# Patient Record
Sex: Male | Born: 1935
Health system: Southern US, Community
[De-identification: ages and names within clinical notes are randomized; demographics above are authoritative.]

## PROBLEM LIST (undated history)

## (undated) DIAGNOSIS — I10 Essential (primary) hypertension: Secondary | ICD-10-CM

## (undated) DIAGNOSIS — N281 Cyst of kidney, acquired: Secondary | ICD-10-CM

## (undated) DIAGNOSIS — R001 Bradycardia, unspecified: Secondary | ICD-10-CM

## (undated) DIAGNOSIS — M1990 Unspecified osteoarthritis, unspecified site: Secondary | ICD-10-CM

## (undated) DIAGNOSIS — H269 Unspecified cataract: Secondary | ICD-10-CM

## (undated) DIAGNOSIS — N189 Chronic kidney disease, unspecified: Secondary | ICD-10-CM

## (undated) DIAGNOSIS — E785 Hyperlipidemia, unspecified: Secondary | ICD-10-CM

## (undated) DIAGNOSIS — H409 Unspecified glaucoma: Secondary | ICD-10-CM

## (undated) HISTORY — PX: APPENDECTOMY: SHX54

## (undated) HISTORY — DX: Unspecified cataract: H26.9

## (undated) HISTORY — PX: BACK SURGERY: SHX140

## (undated) HISTORY — PX: KNEE SURGERY: SHX244

## (undated) HISTORY — DX: Unspecified glaucoma: H40.9

## (undated) HISTORY — PX: FOOT SURGERY: SHX648

## (undated) HISTORY — PX: SPINE SURGERY: SHX786

## (undated) HISTORY — DX: Hyperlipidemia, unspecified: E78.5

## (undated) HISTORY — PX: JOINT REPLACEMENT: SHX530

## (undated) HISTORY — PX: OTHER SURGICAL HISTORY: SHX169

---

## 1998-01-11 ENCOUNTER — Ambulatory Visit (HOSPITAL_COMMUNITY): Admission: RE | Admit: 1998-01-11 | Discharge: 1998-01-11 | Payer: Self-pay | Admitting: *Deleted

## 1998-06-20 ENCOUNTER — Ambulatory Visit (HOSPITAL_COMMUNITY)
Admission: RE | Admit: 1998-06-20 | Discharge: 1998-06-20 | Payer: Self-pay | Admitting: Thoracic Surgery (Cardiothoracic Vascular Surgery)

## 1998-06-30 ENCOUNTER — Ambulatory Visit (HOSPITAL_COMMUNITY)
Admission: RE | Admit: 1998-06-30 | Discharge: 1998-06-30 | Payer: Self-pay | Admitting: Thoracic Surgery (Cardiothoracic Vascular Surgery)

## 1998-07-05 ENCOUNTER — Other Ambulatory Visit
Admission: RE | Admit: 1998-07-05 | Discharge: 1998-07-05 | Payer: Self-pay | Admitting: Thoracic Surgery (Cardiothoracic Vascular Surgery)

## 1998-07-24 ENCOUNTER — Emergency Department (HOSPITAL_COMMUNITY): Admission: EM | Admit: 1998-07-24 | Discharge: 1998-07-24 | Payer: Self-pay | Admitting: Emergency Medicine

## 1998-07-28 ENCOUNTER — Ambulatory Visit (HOSPITAL_COMMUNITY): Admission: RE | Admit: 1998-07-28 | Discharge: 1998-07-28 | Payer: Self-pay | Admitting: Dermatology

## 1998-08-04 ENCOUNTER — Ambulatory Visit (HOSPITAL_COMMUNITY): Admission: RE | Admit: 1998-08-04 | Discharge: 1998-08-04 | Payer: Self-pay | Admitting: *Deleted

## 1998-08-04 ENCOUNTER — Encounter: Payer: Self-pay | Admitting: *Deleted

## 1998-09-14 ENCOUNTER — Ambulatory Visit (HOSPITAL_COMMUNITY): Admission: RE | Admit: 1998-09-14 | Discharge: 1998-09-14 | Payer: Self-pay | Admitting: *Deleted

## 1999-06-23 ENCOUNTER — Ambulatory Visit (HOSPITAL_COMMUNITY): Admission: RE | Admit: 1999-06-23 | Discharge: 1999-06-23 | Payer: Self-pay | Admitting: *Deleted

## 1999-08-01 ENCOUNTER — Ambulatory Visit (HOSPITAL_COMMUNITY): Admission: RE | Admit: 1999-08-01 | Discharge: 1999-08-02 | Payer: Self-pay | Admitting: Ophthalmology

## 1999-08-01 ENCOUNTER — Encounter: Payer: Self-pay | Admitting: Ophthalmology

## 2000-10-13 ENCOUNTER — Encounter: Payer: Self-pay | Admitting: Family Medicine

## 2000-10-13 ENCOUNTER — Inpatient Hospital Stay (HOSPITAL_COMMUNITY): Admission: EM | Admit: 2000-10-13 | Discharge: 2000-10-16 | Payer: Self-pay | Admitting: Emergency Medicine

## 2000-12-04 ENCOUNTER — Ambulatory Visit (HOSPITAL_COMMUNITY): Admission: RE | Admit: 2000-12-04 | Discharge: 2000-12-04 | Payer: Self-pay | Admitting: *Deleted

## 2001-10-29 ENCOUNTER — Encounter: Payer: Self-pay | Admitting: Orthopaedic Surgery

## 2001-11-04 ENCOUNTER — Inpatient Hospital Stay (HOSPITAL_COMMUNITY): Admission: RE | Admit: 2001-11-04 | Discharge: 2001-11-08 | Payer: Self-pay | Admitting: Orthopaedic Surgery

## 2001-11-08 ENCOUNTER — Inpatient Hospital Stay (HOSPITAL_COMMUNITY)
Admission: RE | Admit: 2001-11-08 | Discharge: 2001-11-14 | Payer: Self-pay | Admitting: Physical Medicine & Rehabilitation

## 2001-11-13 ENCOUNTER — Encounter: Payer: Self-pay | Admitting: Physical Medicine & Rehabilitation

## 2002-10-24 ENCOUNTER — Ambulatory Visit (HOSPITAL_COMMUNITY): Admission: RE | Admit: 2002-10-24 | Discharge: 2002-10-24 | Payer: Self-pay | Admitting: *Deleted

## 2002-10-24 ENCOUNTER — Encounter: Payer: Self-pay | Admitting: *Deleted

## 2004-12-13 ENCOUNTER — Encounter: Admission: RE | Admit: 2004-12-13 | Discharge: 2004-12-13 | Payer: Self-pay | Admitting: Internal Medicine

## 2006-01-01 ENCOUNTER — Inpatient Hospital Stay (HOSPITAL_COMMUNITY): Admission: RE | Admit: 2006-01-01 | Discharge: 2006-01-07 | Payer: Self-pay | Admitting: Orthopaedic Surgery

## 2006-01-01 ENCOUNTER — Ambulatory Visit: Payer: Self-pay | Admitting: Physical Medicine & Rehabilitation

## 2006-07-05 ENCOUNTER — Emergency Department (HOSPITAL_COMMUNITY): Admission: EM | Admit: 2006-07-05 | Discharge: 2006-07-05 | Payer: Self-pay | Admitting: Emergency Medicine

## 2006-07-22 ENCOUNTER — Emergency Department (HOSPITAL_COMMUNITY): Admission: EM | Admit: 2006-07-22 | Discharge: 2006-07-23 | Payer: Self-pay | Admitting: Emergency Medicine

## 2006-11-07 ENCOUNTER — Encounter: Admission: RE | Admit: 2006-11-07 | Discharge: 2006-11-07 | Payer: Self-pay | Admitting: Internal Medicine

## 2012-03-04 ENCOUNTER — Encounter (HOSPITAL_COMMUNITY): Payer: Self-pay | Admitting: Pharmacy Technician

## 2012-03-18 ENCOUNTER — Ambulatory Visit (HOSPITAL_COMMUNITY)
Admission: RE | Admit: 2012-03-18 | Discharge: 2012-03-19 | Disposition: A | Discharge status: Home / Self Care | Payer: Medicare Other | Source: Ambulatory Visit | Attending: Cardiology | Admitting: Cardiology

## 2012-03-18 ENCOUNTER — Encounter (HOSPITAL_COMMUNITY)
Admission: RE | Disposition: A | Discharge status: Home / Self Care | Payer: Self-pay | Source: Ambulatory Visit | Attending: Cardiology

## 2012-03-18 ENCOUNTER — Encounter (HOSPITAL_COMMUNITY): Payer: Self-pay | Admitting: General Practice

## 2012-03-18 DIAGNOSIS — I129 Hypertensive chronic kidney disease with stage 1 through stage 4 chronic kidney disease, or unspecified chronic kidney disease: Secondary | ICD-10-CM | POA: Insufficient documentation

## 2012-03-18 DIAGNOSIS — E785 Hyperlipidemia, unspecified: Secondary | ICD-10-CM | POA: Diagnosis present

## 2012-03-18 DIAGNOSIS — Z8679 Personal history of other diseases of the circulatory system: Secondary | ICD-10-CM

## 2012-03-18 DIAGNOSIS — N189 Chronic kidney disease, unspecified: Secondary | ICD-10-CM | POA: Insufficient documentation

## 2012-03-18 DIAGNOSIS — I2510 Atherosclerotic heart disease of native coronary artery without angina pectoris: Secondary | ICD-10-CM

## 2012-03-18 DIAGNOSIS — I150 Renovascular hypertension: Secondary | ICD-10-CM | POA: Insufficient documentation

## 2012-03-18 HISTORY — DX: Essential (primary) hypertension: I10

## 2012-03-18 HISTORY — PX: LEFT HEART CATHETERIZATION WITH CORONARY ANGIOGRAM: SHX5451

## 2012-03-18 HISTORY — PX: OTHER SURGICAL HISTORY: SHX169

## 2012-03-18 HISTORY — DX: Unspecified osteoarthritis, unspecified site: M19.90

## 2012-03-18 HISTORY — DX: Personal history of other diseases of the circulatory system: Z86.79

## 2012-03-18 HISTORY — PX: RENAL ANGIOGRAM: SHX5509

## 2012-03-18 LAB — BASIC METABOLIC PANEL
BUN: 25 mg/dL — ABNORMAL HIGH (ref 6–23)
CO2: 25 mEq/L (ref 19–32)
Chloride: 106 mEq/L (ref 96–112)
Creatinine, Ser: 1.32 mg/dL (ref 0.50–1.35)
Glucose, Bld: 87 mg/dL (ref 70–99)
Potassium: 4.2 mEq/L (ref 3.5–5.1)

## 2012-03-18 SURGERY — LEFT HEART CATHETERIZATION WITH CORONARY ANGIOGRAM
Anesthesia: LOCAL

## 2012-03-18 MED ORDER — NITROGLYCERIN 0.2 MG/ML ON CALL CATH LAB
INTRAVENOUS | Status: AC
  Filled 2012-03-18: qty 1

## 2012-03-18 MED ORDER — TAMSULOSIN HCL 0.4 MG PO CAPS
0.4000 mg | ORAL_CAPSULE | Freq: Every day | ORAL | Status: DC
  Administered 2012-03-18 – 2012-03-19 (×2): 0.4 mg via ORAL
  Filled 2012-03-18 (×2): qty 1

## 2012-03-18 MED ORDER — SIMVASTATIN 20 MG PO TABS
20.0000 mg | ORAL_TABLET | Freq: Every day | ORAL | Status: DC
  Administered 2012-03-18: 20 mg via ORAL
  Filled 2012-03-18 (×2): qty 1

## 2012-03-18 MED ORDER — CLOPIDOGREL BISULFATE 300 MG PO TABS
ORAL_TABLET | ORAL | Status: AC
  Filled 2012-03-18: qty 2

## 2012-03-18 MED ORDER — AMLODIPINE BESYLATE 2.5 MG PO TABS
2.5000 mg | ORAL_TABLET | Freq: Every day | ORAL | Status: DC
  Administered 2012-03-19: 2.5 mg via ORAL
  Filled 2012-03-18: qty 1

## 2012-03-18 MED ORDER — ASPIRIN EC 81 MG PO TBEC
81.0000 mg | DELAYED_RELEASE_TABLET | Freq: Every day | ORAL | Status: DC
Start: 2012-03-19 — End: 2012-03-19
  Administered 2012-03-19: 81 mg via ORAL
  Filled 2012-03-18: qty 1

## 2012-03-18 MED ORDER — ACETAMINOPHEN 325 MG PO TABS: 650.0000 mg | ORAL_TABLET | ORAL | Status: DC | PRN

## 2012-03-18 MED ORDER — ATROPINE SULFATE 1 MG/ML IJ SOLN
INTRAMUSCULAR | Status: AC
  Filled 2012-03-18: qty 1

## 2012-03-18 MED ORDER — TIMOLOL HEMIHYDRATE 0.5 % OP SOLN: 1.0000 [drp] | Freq: Two times a day (BID) | OPHTHALMIC | Status: DC

## 2012-03-18 MED ORDER — LIDOCAINE HCL (PF) 1 % IJ SOLN
INTRAMUSCULAR | Status: AC
  Filled 2012-03-18: qty 30

## 2012-03-18 MED ORDER — HEPARIN (PORCINE) IN NACL 2-0.9 UNIT/ML-% IJ SOLN
INTRAMUSCULAR | Status: AC
  Filled 2012-03-18: qty 2000

## 2012-03-18 MED ORDER — SODIUM CHLORIDE 0.9 % IV SOLN
1.0000 mL/kg/h | INTRAVENOUS | Status: AC
  Administered 2012-03-18: 1 mL/kg/h via INTRAVENOUS

## 2012-03-18 MED ORDER — TIMOLOL MALEATE 0.5 % OP SOLN
1.0000 [drp] | Freq: Two times a day (BID) | OPHTHALMIC | Status: DC
  Administered 2012-03-18 – 2012-03-19 (×2): 1 [drp] via OPHTHALMIC
  Filled 2012-03-18: qty 5

## 2012-03-18 MED ORDER — ONDANSETRON HCL 4 MG/2ML IJ SOLN: 4.0000 mg | Freq: Four times a day (QID) | INTRAMUSCULAR | Status: DC | PRN

## 2012-03-18 MED ORDER — FAMOTIDINE 40 MG PO TABS
40.0000 mg | ORAL_TABLET | Freq: Every day | ORAL | Status: DC
  Administered 2012-03-19: 40 mg via ORAL
  Filled 2012-03-18: qty 1

## 2012-03-18 MED ORDER — HYDRALAZINE HCL 25 MG PO TABS
25.0000 mg | ORAL_TABLET | Freq: Three times a day (TID) | ORAL | Status: DC
  Administered 2012-03-18: 25 mg via ORAL
  Filled 2012-03-18 (×2): qty 1

## 2012-03-18 MED ORDER — SODIUM CHLORIDE 0.9 % IJ SOLN: 3.0000 mL | INTRAMUSCULAR | Status: DC | PRN

## 2012-03-18 MED ORDER — MIDAZOLAM HCL 2 MG/2ML IJ SOLN
INTRAMUSCULAR | Status: AC
  Filled 2012-03-18: qty 2

## 2012-03-18 MED ORDER — ASPIRIN 81 MG PO CHEW
324.0000 mg | CHEWABLE_TABLET | ORAL | Status: AC
  Administered 2012-03-18: 324 mg via ORAL
  Filled 2012-03-18: qty 4

## 2012-03-18 MED ORDER — SODIUM CHLORIDE 0.9 % IV SOLN: 250.0000 mL | INTRAVENOUS | Status: DC | PRN

## 2012-03-18 MED ORDER — MORPHINE SULFATE 2 MG/ML IJ SOLN
2.0000 mg | INTRAMUSCULAR | Status: DC | PRN
  Administered 2012-03-18: 4 mg via INTRAVENOUS
  Filled 2012-03-18: qty 2

## 2012-03-18 MED ORDER — BISOPROLOL-HYDROCHLOROTHIAZIDE 5-6.25 MG PO TABS
1.0000 | ORAL_TABLET | Freq: Every day | ORAL | Status: DC
Start: 2012-03-19 — End: 2012-03-19
  Administered 2012-03-19: 1 via ORAL
  Filled 2012-03-18: qty 1

## 2012-03-18 MED ORDER — LOSARTAN POTASSIUM 50 MG PO TABS
100.0000 mg | ORAL_TABLET | Freq: Every day | ORAL | Status: DC
  Administered 2012-03-18 – 2012-03-19 (×2): 100 mg via ORAL
  Filled 2012-03-18 (×2): qty 2

## 2012-03-18 MED ORDER — SODIUM CHLORIDE 0.9 % IJ SOLN: 3.0000 mL | Freq: Two times a day (BID) | INTRAMUSCULAR | Status: DC

## 2012-03-18 MED ORDER — FENTANYL CITRATE 0.05 MG/ML IJ SOLN
INTRAMUSCULAR | Status: AC
  Filled 2012-03-18: qty 2

## 2012-03-18 MED ORDER — LOSARTAN POTASSIUM 50 MG PO TABS: 100.0000 mg | ORAL_TABLET | Freq: Every day | ORAL | Status: DC

## 2012-03-18 MED ORDER — SODIUM CHLORIDE 0.9 % IV SOLN: 500.0000 mL | Freq: Once | INTRAVENOUS | Status: DC

## 2012-03-18 MED ORDER — SODIUM CHLORIDE 0.9 % IV SOLN
INTRAVENOUS | Status: DC
  Administered 2012-03-18: 1000 mL via INTRAVENOUS

## 2012-03-18 MED ORDER — CLOPIDOGREL BISULFATE 75 MG PO TABS: 75.0000 mg | ORAL_TABLET | Freq: Every day | ORAL | Status: DC

## 2012-03-18 NOTE — Progress Notes (Signed)
Site area: right groin  Site Prior to Removal:  Level 0  Pressure Applied For 20 MINUTES    Minutes Beginning at 1305  Manual:   yes  Patient Status During Pull:  See below  Post Pull Groin Site:  Level 0  Post Pull Instructions Given:  yes  Post Pull Pulses Present:  yes  Dressing Applied:  yes  Comments:  Patient in significant pain during hold despite 4 mb IV morphine.  Pressure and HR dropped.  Given 250 NS bolus, 1/2 amp atropine, placed in T berg.  Recovered nicely.

## 2012-03-18 NOTE — CV Procedure (Signed)
Procedure performed:  Left heart catheterization including hemodynamic monitoring of the left ventricle, selective right and left coronary arteriography. Abdominal aortogram, selective right renal arteriogram, PTA and stenting of the right lower artery with implantation of a 6.0 x 15 mm Herculink Elite stent.  Indication patient is a 77 year old fairly active gentleman with history of difficult to control hypertension who presents with shortness of breath. His echocardiogram had revealed normal systolic function, moderate left hypertrophy and mild pulmonary hypertension. A stress test revealed distal anterior and anteroapical severe small sized defect suggestive of coronary artery disease. The study performed on 02/15/2012. Hence is brought to the cardiac catheterization lab to evaluate his coronary anatomy. There was also suspicion for renal artery stenosis given difficult to control hypertension.  Hemodynamic data:  Left ventricular pressure was 117/3 with LVEDP of 9 mm mercury. Aortic pressure was 104/63 with a mean of 88 mm mercury.  Left ventricle: Not performed to conserve contrast. Has known normal LV systolic function.  Right coronary artery: The vessel is smooth with mild luminal irregularity. It is dominant. No significant stenosis.  Left main coronary artery is large and normal. Next  Circumflex coronary artery: A large vessel giving origin to a large obtuse marginal 1. Ostium has a 40-50% stenosis. Next  LAD: Ostroff LAD has a 40-50% stenoses. LAD gives origin to a large diagonal 1. LAD has mild luminal irregularity. No high-grade stenosis was evident. Next  Abdominal aortogram: There were 2 renal arteries one on either sides. The right renal artery showed high-grade stenoses. This was confirmed by selective right lower arteriogram which confirmed high-grade 80-90% stenoses with severe damping of flow with engagement with the catheter. Left renal was widely patent.  Abdominal  aorta was normal without evidence of abdominal aortic aneurysm. Aortoiliac bifurcation is widely patent.  Interventional data: Successful PTA and stenting of the right renal artery with implantation of a 6.0 x 15 mm Herculink Elite stent. Stenosis reduced from 80% to 0%. Brisk flow was evident. The ostium of the right renal artery had fibrotic plaque which was very difficult to dilate with watermelon seed effect with balloon dilatations. There was distal mal apposition of the stent as the artery was much larger. However there was no flow limitation, haziness hence was felt to be reasonable to be left alone. Even if there was a dissection, the dissection was proximal to the stent edge. No immediate complications. Patient tolerated the procedure well.  Technique: Under sterile precautions using a 6 French right femoral arterial access, a 6 French sheath was introduced into the right femoral artery under fluoroscopy guidance. A 6 [place] multipurpose B2 catheter was advanced into the ascending aorta and then into the left ventricle. Hemodynamics are lysed. Catheter pulled into the ascending aorta and right coronary artery was cannulated, then left coronary artery was cannulated and angiography was performed in multiple views. The catheter was pulled back into the abdominal aorta and abdominal aortogram was performed under digital subtraction angiography. A 5 French IMA catheter was used to engage the right renal artery.  Intervention: Using anticoagulation and maintaining ACT greater than 200, a 6 French LIMA catheter was utilized to engage the right renal artery. Severe damping of pressure noted with arterial engagements. A stabilizer guidewire was advanced into the renal artery and predilatation was was performed using 5 x 15 mm via Trak balloon. Watermelon seed effect was evident, successfully balloon angioplasty however significant residual stenoses still evident due to recoil. 6 x 15 mm Herculink stent  was advanced  to the site of the stenosis and gently and carefully with slow inflation stent was deployed at a peak of 14 atm pressure after angiography was repeated advance the stent balloon back distally and a second inflation 10 atmospheric pressure was performed for 25 seconds to see if I can get the stent distal edge to oppose to the vessel wall. Multiple views were taken angiographically. I also had a curb side discussion with Dr. Trula Slade who also agreed to leave the lesion alone.  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 Arterial access was at the bifurcation of the common femoral and profunda. Hence the sheath was sutured in place for ACT to be sub-therapeutic for sheath withdrawal.  No immediate competitions. Patient tolerated the procedure well. A total of approximately and 40 cc of contrast was utilized for the procedure.

## 2012-03-18 NOTE — Progress Notes (Signed)
Tele running SB 37-44. Pt asymtomatic.  BP 103/58.  Dr Einar Gip informed of this and of vagal incident during sheath pull today and emergency standing orders implemented by Chales Salmon RN.  Hydralazine d/c'd per Dr. Einar Gip order.  Pt resting comfortably without complaints.  Rt groin level 0

## 2012-03-18 NOTE — H&P (Addendum)
  Please see paper chart. No change in HPI since last evaluation. All questions answered regarding angiogram and possible angioplasty.

## 2012-03-18 NOTE — Interval H&P Note (Signed)
History and Physical Interval Note:  03/18/2012 7:50 AM  Wayne [name]  has presented today for surgery, with the diagnosis of Chest pain  The various methods of treatment have been discussed with the patient and family. After consideration of risks, benefits and other options for treatment, the patient has consented to  Procedure(s) (LRB): LEFT HEART CATHETERIZATION WITH CORONARY ANGIOGRAM (N/A) RENAL ANGIOGRAM (N/A) as a surgical intervention .  The patients' history has been reviewed, patient examined, no change in status, stable for surgery.  I have reviewed the patients' chart and labs.  Questions were answered to the patient's satisfaction.     Laverda Page

## 2012-03-19 LAB — BASIC METABOLIC PANEL
BUN: 25 mg/dL — ABNORMAL HIGH (ref 6–23)
CO2: 24 mEq/L (ref 19–32)
Chloride: 108 mEq/L (ref 96–112)
Creatinine, Ser: 1.5 mg/dL — ABNORMAL HIGH (ref 0.50–1.35)
Glucose, Bld: 108 mg/dL — ABNORMAL HIGH (ref 70–99)

## 2012-03-19 LAB — CBC
HCT: 35.2 % — ABNORMAL LOW (ref 39.0–52.0)
MCH: 33.1 pg (ref 26.0–34.0)
MCHC: 34.1 g/dL (ref 30.0–36.0)
MCV: 97.2 fL (ref 78.0–100.0)
RDW: 12.8 % (ref 11.5–15.5)
WBC: 4.1 10*3/uL (ref 4.0–10.5)

## 2012-03-19 MED ORDER — LOSARTAN POTASSIUM 100 MG PO TABS: 100.0000 mg | ORAL_TABLET | Freq: Every day | ORAL | Status: DC

## 2012-03-19 MED ORDER — CLOPIDOGREL BISULFATE 75 MG PO TABS: 75.0000 mg | ORAL_TABLET | Freq: Every day | ORAL | Status: AC

## 2012-03-19 NOTE — Discharge Instructions (Signed)
Do not take Losartan for 4 days. We need to check blood work in 3-4 days. OV in 2 weeks.

## 2012-03-19 NOTE — Discharge Summary (Signed)
Physician Discharge Summary  Patient ID: Wayne [name] MRN: [id number] DOB/AGE: 06/04/1926 78 y.o.  Admit date: 03/18/2012 Discharge date: 03/19/2012  Primary Discharge Diagnosis CAD  Secondary Discharge Diagnosis Renovascular hypertension. Chronic renal insufficiency  Significant Diagnostic Studies:  Consults:   Hospital Course: Patient was admitted to the hospital for elective coronary angiography after an abnormal stress test. Clinically below as the hypertension was also suspected. He underwent coronary angiography which revealed percent stenosis of the ostial LAD and circumflex carotid. This was felt to be not significant and could be managed medically. He was found to have a high-grade fibrotic stenoses of the right lower artery. He underwent successful angioplasty with implantation of a balloon-expandable 6.0 x 15 mm Herculink stent. The following day he was felt to be stable for discharge. He does have chronic baseline renal insufficiency. With hydration his serum creatinine was down to 1.3 and on the day of discharge it is 1.5. Losartan will be for the next 3-4 days and a repeat BMP will be obtained in 3-4 days. Due to borderline blood pressure I have stopped hydralazine. Office visit in 2 weeks.   Discharge Exam: Blood pressure 127/59, pulse 50, temperature 98 F (36.7 C), temperature source Oral, resp. rate 18, height 5\' 7"  (1.702 m), weight 74.844 kg (165 lb), SpO2 97.00%.    General appearance: alert, cooperative, appears stated age and no distress Resp: clear to auscultation bilaterally Cardio: regular rate and rhythm, S1, S2 normal, no murmur, click, rub or gallop Extremities: extremities normal, atraumatic, no cyanosis or edema Incision/Wound: Right groin site has healed well. Labs:   No results found for this basename: WBC, HGB, HCT, MCV, PLT    Lab 03/19/12 0400  NA 140  K 3.6  CL 108  CO2 24  BUN 25*  CREATININE 1.50*  CALCIUM 8.6  PROT --  BILITOT --   ALKPHOS --  ALT --  AST --  GLUCOSE 108*     [id number]: NSR  FOLLOW UP PLANS AND APPOINTMENTS  Medication List  As of 03/19/2012  7:41 AM   STOP taking these medications         hydrALAZINE 25 MG tablet         TAKE these medications         ALLERGY MED PO   Take 1 tablet by mouth daily. OTC Equate brand      amLODipine 5 MG tablet   Commonly known as: NORVASC   Take 2.5 mg by mouth daily.      aspirin EC 81 MG tablet   Take 81 mg by mouth daily.      bisoprolol-hydrochlorothiazide 5-6.25 MG per tablet   Commonly known as: ZIAC   Take 1 tablet by mouth daily.      clopidogrel 75 MG tablet   Commonly known as: PLAVIX   Take 1 tablet (75 mg total) by mouth daily.      fish oil-omega-3 fatty acids 1000 MG capsule   Take 1 g by mouth 2 (two) times daily.      guaiFENesin 200 MG tablet   Take 400 mg by mouth every 4 (four) hours as needed. For congestion      losartan 100 MG tablet   Commonly known as: COZAAR   Take 1 tablet (100 mg total) by mouth daily.   Start taking on: 03/25/2012      omeprazole 40 MG capsule   Commonly known as: PRILOSEC   Take 40 mg by mouth daily.  pravastatin 40 MG tablet   Commonly known as: PRAVACHOL   Take 40 mg by mouth daily.      Tamsulosin HCl 0.4 MG Caps   Commonly known as: FLOMAX   Take 0.4 mg by mouth daily.      timolol 0.5 % ophthalmic solution   Commonly known as: BETIMOL   Place 1 drop into both eyes 2 (two) times daily.      VITAMIN D PO   Take 600 mg by mouth daily.              Laverda Page, MD 03/19/2012, 7:41 AM

## 2012-03-24 LAB — POCT ACTIVATED CLOTTING TIME: Activated Clotting Time: 138 seconds

## 2012-10-17 DIAGNOSIS — B353 Tinea pedis: Secondary | ICD-10-CM | POA: Diagnosis not present

## 2012-12-23 DIAGNOSIS — H2510 Age-related nuclear cataract, unspecified eye: Secondary | ICD-10-CM | POA: Diagnosis not present

## 2012-12-23 DIAGNOSIS — H40029 Open angle with borderline findings, high risk, unspecified eye: Secondary | ICD-10-CM | POA: Diagnosis not present

## 2012-12-23 DIAGNOSIS — Z961 Presence of intraocular lens: Secondary | ICD-10-CM | POA: Diagnosis not present

## 2012-12-23 DIAGNOSIS — H02409 Unspecified ptosis of unspecified eyelid: Secondary | ICD-10-CM | POA: Diagnosis not present

## 2013-01-19 DIAGNOSIS — Z79899 Other long term (current) drug therapy: Secondary | ICD-10-CM | POA: Diagnosis not present

## 2013-01-19 DIAGNOSIS — R209 Unspecified disturbances of skin sensation: Secondary | ICD-10-CM | POA: Diagnosis not present

## 2013-01-19 DIAGNOSIS — N182 Chronic kidney disease, stage 2 (mild): Secondary | ICD-10-CM | POA: Diagnosis not present

## 2013-01-19 DIAGNOSIS — I129 Hypertensive chronic kidney disease with stage 1 through stage 4 chronic kidney disease, or unspecified chronic kidney disease: Secondary | ICD-10-CM | POA: Diagnosis not present

## 2013-04-10 DIAGNOSIS — N182 Chronic kidney disease, stage 2 (mild): Secondary | ICD-10-CM | POA: Diagnosis not present

## 2013-04-10 DIAGNOSIS — I2510 Atherosclerotic heart disease of native coronary artery without angina pectoris: Secondary | ICD-10-CM | POA: Diagnosis not present

## 2013-04-10 DIAGNOSIS — I129 Hypertensive chronic kidney disease with stage 1 through stage 4 chronic kidney disease, or unspecified chronic kidney disease: Secondary | ICD-10-CM | POA: Diagnosis not present

## 2013-04-20 ENCOUNTER — Other Ambulatory Visit: Payer: Self-pay | Admitting: Gastroenterology

## 2013-04-20 DIAGNOSIS — R1013 Epigastric pain: Secondary | ICD-10-CM | POA: Diagnosis not present

## 2013-04-20 DIAGNOSIS — R1011 Right upper quadrant pain: Secondary | ICD-10-CM | POA: Diagnosis not present

## 2013-04-20 DIAGNOSIS — R1012 Left upper quadrant pain: Secondary | ICD-10-CM | POA: Diagnosis not present

## 2013-04-28 DIAGNOSIS — M19079 Primary osteoarthritis, unspecified ankle and foot: Secondary | ICD-10-CM | POA: Diagnosis not present

## 2013-04-30 ENCOUNTER — Ambulatory Visit
Admission: RE | Admit: 2013-04-30 | Discharge: 2013-04-30 | Disposition: A | Discharge status: Home / Self Care | Payer: Medicare Other | Source: Ambulatory Visit | Attending: Gastroenterology | Admitting: Gastroenterology

## 2013-04-30 DIAGNOSIS — R0682 Tachypnea, not elsewhere classified: Secondary | ICD-10-CM | POA: Diagnosis not present

## 2013-04-30 DIAGNOSIS — R1013 Epigastric pain: Secondary | ICD-10-CM

## 2013-04-30 DIAGNOSIS — R110 Nausea: Secondary | ICD-10-CM | POA: Diagnosis not present

## 2013-05-06 DIAGNOSIS — Z0100 Encounter for examination of eyes and vision without abnormal findings: Secondary | ICD-10-CM | POA: Diagnosis not present

## 2013-05-06 DIAGNOSIS — E559 Vitamin D deficiency, unspecified: Secondary | ICD-10-CM | POA: Diagnosis not present

## 2013-05-06 DIAGNOSIS — Z0000 Encounter for general adult medical examination without abnormal findings: Secondary | ICD-10-CM | POA: Diagnosis not present

## 2013-05-06 DIAGNOSIS — R351 Nocturia: Secondary | ICD-10-CM | POA: Diagnosis not present

## 2013-05-06 DIAGNOSIS — I2510 Atherosclerotic heart disease of native coronary artery without angina pectoris: Secondary | ICD-10-CM | POA: Diagnosis not present

## 2013-05-06 DIAGNOSIS — I129 Hypertensive chronic kidney disease with stage 1 through stage 4 chronic kidney disease, or unspecified chronic kidney disease: Secondary | ICD-10-CM | POA: Diagnosis not present

## 2013-05-06 DIAGNOSIS — R209 Unspecified disturbances of skin sensation: Secondary | ICD-10-CM | POA: Diagnosis not present

## 2013-05-06 DIAGNOSIS — Z79899 Other long term (current) drug therapy: Secondary | ICD-10-CM | POA: Diagnosis not present

## 2013-05-06 DIAGNOSIS — N182 Chronic kidney disease, stage 2 (mild): Secondary | ICD-10-CM | POA: Diagnosis not present

## 2013-05-06 DIAGNOSIS — Z0110 Encounter for examination of ears and hearing without abnormal findings: Secondary | ICD-10-CM | POA: Diagnosis not present

## 2013-05-18 DIAGNOSIS — R1012 Left upper quadrant pain: Secondary | ICD-10-CM | POA: Diagnosis not present

## 2013-05-18 DIAGNOSIS — R1013 Epigastric pain: Secondary | ICD-10-CM | POA: Diagnosis not present

## 2013-05-18 DIAGNOSIS — R1011 Right upper quadrant pain: Secondary | ICD-10-CM | POA: Diagnosis not present

## 2013-05-26 DIAGNOSIS — B353 Tinea pedis: Secondary | ICD-10-CM | POA: Diagnosis not present

## 2013-05-26 DIAGNOSIS — M19079 Primary osteoarthritis, unspecified ankle and foot: Secondary | ICD-10-CM | POA: Diagnosis not present

## 2013-05-26 DIAGNOSIS — Z79899 Other long term (current) drug therapy: Secondary | ICD-10-CM | POA: Diagnosis not present

## 2013-06-10 DIAGNOSIS — I1310 Hypertensive heart and chronic kidney disease without heart failure, with stage 1 through stage 4 chronic kidney disease, or unspecified chronic kidney disease: Secondary | ICD-10-CM | POA: Diagnosis not present

## 2013-06-10 DIAGNOSIS — Z79899 Other long term (current) drug therapy: Secondary | ICD-10-CM | POA: Diagnosis not present

## 2013-06-10 DIAGNOSIS — N182 Chronic kidney disease, stage 2 (mild): Secondary | ICD-10-CM | POA: Diagnosis not present

## 2013-06-10 DIAGNOSIS — B029 Zoster without complications: Secondary | ICD-10-CM | POA: Diagnosis not present

## 2013-06-30 DIAGNOSIS — H2510 Age-related nuclear cataract, unspecified eye: Secondary | ICD-10-CM | POA: Diagnosis not present

## 2013-06-30 DIAGNOSIS — H02429 Myogenic ptosis of unspecified eyelid: Secondary | ICD-10-CM | POA: Diagnosis not present

## 2013-06-30 DIAGNOSIS — H40019 Open angle with borderline findings, low risk, unspecified eye: Secondary | ICD-10-CM | POA: Diagnosis not present

## 2013-06-30 DIAGNOSIS — H2589 Other age-related cataract: Secondary | ICD-10-CM | POA: Diagnosis not present

## 2013-06-30 DIAGNOSIS — Z961 Presence of intraocular lens: Secondary | ICD-10-CM | POA: Diagnosis not present

## 2013-08-11 DIAGNOSIS — Z23 Encounter for immunization: Secondary | ICD-10-CM | POA: Diagnosis not present

## 2013-08-11 DIAGNOSIS — N401 Enlarged prostate with lower urinary tract symptoms: Secondary | ICD-10-CM | POA: Diagnosis not present

## 2013-08-11 DIAGNOSIS — R351 Nocturia: Secondary | ICD-10-CM | POA: Diagnosis not present

## 2013-09-09 DIAGNOSIS — N182 Chronic kidney disease, stage 2 (mild): Secondary | ICD-10-CM | POA: Diagnosis not present

## 2013-09-09 DIAGNOSIS — M2550 Pain in unspecified joint: Secondary | ICD-10-CM | POA: Diagnosis not present

## 2013-09-09 DIAGNOSIS — M19049 Primary osteoarthritis, unspecified hand: Secondary | ICD-10-CM | POA: Diagnosis not present

## 2013-09-09 DIAGNOSIS — I1310 Hypertensive heart and chronic kidney disease without heart failure, with stage 1 through stage 4 chronic kidney disease, or unspecified chronic kidney disease: Secondary | ICD-10-CM | POA: Diagnosis not present

## 2013-09-09 DIAGNOSIS — E559 Vitamin D deficiency, unspecified: Secondary | ICD-10-CM | POA: Diagnosis not present

## 2013-10-14 ENCOUNTER — Other Ambulatory Visit: Payer: Self-pay | Admitting: Cardiology

## 2013-10-14 DIAGNOSIS — I150 Renovascular hypertension: Secondary | ICD-10-CM

## 2013-10-14 DIAGNOSIS — I701 Atherosclerosis of renal artery: Secondary | ICD-10-CM

## 2013-10-14 DIAGNOSIS — IMO0002 Reserved for concepts with insufficient information to code with codable children: Secondary | ICD-10-CM

## 2013-10-14 DIAGNOSIS — I7090 Unspecified atherosclerosis: Secondary | ICD-10-CM

## 2013-10-14 DIAGNOSIS — I10 Essential (primary) hypertension: Secondary | ICD-10-CM

## 2013-10-14 DIAGNOSIS — Z87448 Personal history of other diseases of urinary system: Secondary | ICD-10-CM

## 2013-10-19 ENCOUNTER — Ambulatory Visit
Admission: RE | Admit: 2013-10-19 | Discharge: 2013-10-19 | Disposition: A | Discharge status: Home / Self Care | Payer: Medicare Other | Source: Ambulatory Visit | Attending: Cardiology | Admitting: Cardiology

## 2013-10-19 DIAGNOSIS — I701 Atherosclerosis of renal artery: Secondary | ICD-10-CM

## 2013-10-19 DIAGNOSIS — I10 Essential (primary) hypertension: Secondary | ICD-10-CM | POA: Diagnosis not present

## 2013-10-22 DIAGNOSIS — I150 Renovascular hypertension: Secondary | ICD-10-CM | POA: Diagnosis not present

## 2013-10-22 DIAGNOSIS — I10 Essential (primary) hypertension: Secondary | ICD-10-CM | POA: Diagnosis not present

## 2013-10-22 DIAGNOSIS — I2510 Atherosclerotic heart disease of native coronary artery without angina pectoris: Secondary | ICD-10-CM | POA: Diagnosis not present

## 2013-10-22 DIAGNOSIS — R7309 Other abnormal glucose: Secondary | ICD-10-CM | POA: Diagnosis not present

## 2013-12-03 HISTORY — PX: OTHER SURGICAL HISTORY: SHX169

## 2013-12-11 ENCOUNTER — Ambulatory Visit: Payer: Self-pay

## 2013-12-18 ENCOUNTER — Ambulatory Visit (INDEPENDENT_AMBULATORY_CARE_PROVIDER_SITE_OTHER): Payer: Medicare Other

## 2013-12-18 VITALS — BP 140/77 | HR 52 | Resp 16 | Ht 67.0 in | Wt 153.0 lb

## 2013-12-18 DIAGNOSIS — E1149 Type 2 diabetes mellitus with other diabetic neurological complication: Secondary | ICD-10-CM

## 2013-12-18 DIAGNOSIS — B353 Tinea pedis: Secondary | ICD-10-CM | POA: Diagnosis not present

## 2013-12-18 DIAGNOSIS — Q828 Other specified congenital malformations of skin: Secondary | ICD-10-CM

## 2013-12-18 DIAGNOSIS — E1140 Type 2 diabetes mellitus with diabetic neuropathy, unspecified: Secondary | ICD-10-CM

## 2013-12-18 DIAGNOSIS — E1142 Type 2 diabetes mellitus with diabetic polyneuropathy: Secondary | ICD-10-CM

## 2013-12-18 DIAGNOSIS — M2040 Other hammer toe(s) (acquired), unspecified foot: Secondary | ICD-10-CM

## 2013-12-18 MED ORDER — NAFTIFINE HCL 2 % EX GEL
CUTANEOUS | Status: DC
Start: 2013-12-18 — End: 2014-06-15

## 2013-12-18 NOTE — Progress Notes (Signed)
   Subjective:    Patient ID: Wayne [name], male    DOB: 21-Mar-1926, 80 y.o.   MRN: [id number] Pt states has corn on right 5th toe, and right 2nd tip toe, and a soft corn between the left 4th and 5th toes, which he is using Naftin. HPI    Review of Systems  Constitutional: Negative.   HENT: Negative.   Eyes: Negative.   Respiratory: Negative.   Cardiovascular: Negative.   Gastrointestinal: Negative.   Endocrine: Negative.   Genitourinary: Negative.   Musculoskeletal: Negative.   Skin: Negative.   Allergic/Immunologic: Negative.   Neurological: Negative.   Hematological: Negative.   Psychiatric/Behavioral: Negative.        Objective:   Physical Exam Neurovascular status appears to be intact pedal pulses palpable DP postal for PT one over 4 bilateral Refill timed 3-4 seconds all digits skin temperature warm turgor normal patient does have Charcot changes both feet right more so than left with prominence of the first met cuneiform base with keratoses. Patient is having pain in the fourth webspace fourth and fifth toe area bilateral has a dorsal keratosis of the fifth digit right foot as well as interdigital keratoses fourth webspaces bilateral secondary to hammertoe and digital contractures. There is some interdigital fissuring and maceration consistent with mild tinea pedis on the left foot as well. No ascending cellulitis lymphangitis no secondary wounds no open wounds or ulcers noted at this time. Nail somewhat criptotic incurvated patient is doing self-care in the nails at this point.       Assessment & Plan:  Assessment diabetes/prediabetic state with possible early neuropathy. Patient also has some burning and stinging distal tuft third digit right foot with a distal clavus formation there is wearing shoes are too short and needs to the larger shoe size. At this time keratoses fourth webspace bilateral debrided to full padding dispensed. New prescription for Naftin cream or  also on also authorize at this time. Correction Naftin gel will be utilized help dry the area in the webspaces. Followup for possible diabetic foot and nail care in 3 months. Contact us with any changes or exacerbations in the interim occur. Again assessment tinea pedis with keratoses secondary to hammertoe deformity fifth bilateral with adductovarus rotation is discuss possibilities of surgical intervention is a possibility for hammertoe repair if continues to have symptoms and recurrences otherwise accommodation with larger shoes and cushioning which is dispensed at this time.  Harriet Masson DPM

## 2013-12-18 NOTE — Patient Instructions (Signed)

## 2013-12-31 DIAGNOSIS — Z961 Presence of intraocular lens: Secondary | ICD-10-CM | POA: Diagnosis not present

## 2013-12-31 DIAGNOSIS — H40019 Open angle with borderline findings, low risk, unspecified eye: Secondary | ICD-10-CM | POA: Diagnosis not present

## 2013-12-31 DIAGNOSIS — H2589 Other age-related cataract: Secondary | ICD-10-CM | POA: Diagnosis not present

## 2014-01-11 DIAGNOSIS — M1990 Unspecified osteoarthritis, unspecified site: Secondary | ICD-10-CM | POA: Diagnosis not present

## 2014-01-11 DIAGNOSIS — I517 Cardiomegaly: Secondary | ICD-10-CM | POA: Diagnosis not present

## 2014-01-11 DIAGNOSIS — I1310 Hypertensive heart and chronic kidney disease without heart failure, with stage 1 through stage 4 chronic kidney disease, or unspecified chronic kidney disease: Secondary | ICD-10-CM | POA: Diagnosis not present

## 2014-01-11 DIAGNOSIS — N182 Chronic kidney disease, stage 2 (mild): Secondary | ICD-10-CM | POA: Diagnosis not present

## 2014-01-11 DIAGNOSIS — I129 Hypertensive chronic kidney disease with stage 1 through stage 4 chronic kidney disease, or unspecified chronic kidney disease: Secondary | ICD-10-CM | POA: Diagnosis not present

## 2014-02-10 ENCOUNTER — Other Ambulatory Visit (HOSPITAL_COMMUNITY): Payer: Self-pay | Admitting: Orthopaedic Surgery

## 2014-02-10 DIAGNOSIS — R52 Pain, unspecified: Secondary | ICD-10-CM

## 2014-02-10 DIAGNOSIS — M25562 Pain in left knee: Secondary | ICD-10-CM

## 2014-02-10 DIAGNOSIS — M25569 Pain in unspecified knee: Secondary | ICD-10-CM | POA: Diagnosis not present

## 2014-02-15 DIAGNOSIS — D485 Neoplasm of uncertain behavior of skin: Secondary | ICD-10-CM | POA: Diagnosis not present

## 2014-02-15 DIAGNOSIS — L439 Lichen planus, unspecified: Secondary | ICD-10-CM | POA: Diagnosis not present

## 2014-02-22 ENCOUNTER — Ambulatory Visit (HOSPITAL_COMMUNITY)
Admission: RE | Admit: 2014-02-22 | Discharge: 2014-02-22 | Disposition: A | Discharge status: Home / Self Care | Payer: Medicare Other | Source: Ambulatory Visit | Attending: Orthopaedic Surgery | Admitting: Orthopaedic Surgery

## 2014-02-22 ENCOUNTER — Encounter (HOSPITAL_COMMUNITY)
Admission: RE | Admit: 2014-02-22 | Discharge: 2014-02-22 | Disposition: A | Discharge status: Home / Self Care | Payer: Medicare Other | Source: Ambulatory Visit | Attending: Orthopaedic Surgery | Admitting: Orthopaedic Surgery

## 2014-02-22 DIAGNOSIS — M25562 Pain in left knee: Secondary | ICD-10-CM

## 2014-02-22 DIAGNOSIS — M25469 Effusion, unspecified knee: Secondary | ICD-10-CM | POA: Diagnosis not present

## 2014-02-22 DIAGNOSIS — M25569 Pain in unspecified knee: Secondary | ICD-10-CM | POA: Diagnosis not present

## 2014-02-22 DIAGNOSIS — Z96659 Presence of unspecified artificial knee joint: Secondary | ICD-10-CM | POA: Insufficient documentation

## 2014-02-22 MED ORDER — TECHNETIUM TC 99M SESTAMIBI GENERIC - CARDIOLITE
25.6000 | Freq: Once | INTRAVENOUS | Status: AC | PRN
  Administered 2014-02-22: 25.6 via INTRAVENOUS

## 2014-03-16 ENCOUNTER — Ambulatory Visit: Payer: Medicare Other

## 2014-03-16 DIAGNOSIS — J029 Acute pharyngitis, unspecified: Secondary | ICD-10-CM | POA: Diagnosis not present

## 2014-03-16 DIAGNOSIS — J209 Acute bronchitis, unspecified: Secondary | ICD-10-CM | POA: Diagnosis not present

## 2014-03-25 ENCOUNTER — Emergency Department (HOSPITAL_COMMUNITY)
Admission: EM | Admit: 2014-03-25 | Discharge: 2014-03-25 | Disposition: A | Discharge status: Home / Self Care | Payer: Medicare Other | Attending: Emergency Medicine | Admitting: Emergency Medicine

## 2014-03-25 ENCOUNTER — Encounter (HOSPITAL_COMMUNITY): Payer: Self-pay | Admitting: Emergency Medicine

## 2014-03-25 ENCOUNTER — Emergency Department (HOSPITAL_COMMUNITY): Payer: Medicare Other

## 2014-03-25 DIAGNOSIS — M129 Arthropathy, unspecified: Secondary | ICD-10-CM | POA: Diagnosis not present

## 2014-03-25 DIAGNOSIS — Z79899 Other long term (current) drug therapy: Secondary | ICD-10-CM | POA: Diagnosis not present

## 2014-03-25 DIAGNOSIS — Z7982 Long term (current) use of aspirin: Secondary | ICD-10-CM | POA: Diagnosis not present

## 2014-03-25 DIAGNOSIS — I10 Essential (primary) hypertension: Secondary | ICD-10-CM | POA: Insufficient documentation

## 2014-03-25 DIAGNOSIS — Z87891 Personal history of nicotine dependence: Secondary | ICD-10-CM | POA: Diagnosis not present

## 2014-03-25 DIAGNOSIS — J3489 Other specified disorders of nose and nasal sinuses: Secondary | ICD-10-CM | POA: Diagnosis not present

## 2014-03-25 DIAGNOSIS — Z792 Long term (current) use of antibiotics: Secondary | ICD-10-CM | POA: Diagnosis not present

## 2014-03-25 DIAGNOSIS — R059 Cough, unspecified: Secondary | ICD-10-CM

## 2014-03-25 DIAGNOSIS — R05 Cough: Secondary | ICD-10-CM | POA: Diagnosis not present

## 2014-03-25 DIAGNOSIS — J45909 Unspecified asthma, uncomplicated: Secondary | ICD-10-CM | POA: Insufficient documentation

## 2014-03-25 MED ORDER — HYDROCOD POLST-CHLORPHEN POLST 10-8 MG/5ML PO LQCR
5.0000 mL | Freq: Every evening | ORAL | Status: DC | PRN
Start: 2014-03-25 — End: 2014-03-25

## 2014-03-25 MED ORDER — GUAIFENESIN ER 600 MG PO TB12: 600.0000 mg | ORAL_TABLET | Freq: Two times a day (BID) | ORAL | Status: DC

## 2014-03-25 MED ORDER — HYDROCOD POLST-CHLORPHEN POLST 10-8 MG/5ML PO LQCR: 5.0000 mL | Freq: Every evening | ORAL | Status: DC | PRN

## 2014-03-25 NOTE — Discharge Instructions (Signed)
Cough, Adult  A cough is a reflex. It helps you clear your throat and airways. A cough can help heal your body. A cough can last 2 or 3 weeks (acute) or may last more than 8 weeks (chronic). Some common causes of a cough can include an infection, allergy, or a cold. HOME CARE  Only take medicine as told by your doctor.  If given, take your medicines (antibiotics) as told. Finish them even if you start to feel better.  Use a cold steam vaporizer or humidier in your home. This can help loosen thick spit (secretions).  Sleep so you are almost sitting up (semi-upright). Use pillows to do this. This helps reduce coughing.  Rest as needed.  Stop smoking if you smoke. GET HELP RIGHT AWAY IF:  You have yellowish-white fluid (pus) in your thick spit.  Your cough gets worse.  Your medicine does not reduce coughing, and you are losing sleep.  You cough up blood.  You have trouble breathing.  Your pain gets worse and medicine does not help.  You have a fever. MAKE SURE YOU:   Understand these instructions.  Will watch your condition.  Will get help right away if you are not doing well or get worse. Document Released: 08/02/2011 Document Revised: 02/11/2012 Document Reviewed: 08/02/2011 [employer] Patient Information 2014 [employer].  

## 2014-03-25 NOTE — ED Notes (Signed)
Cough x 3 wks

## 2014-03-25 NOTE — ED Notes (Signed)
Pt reports generalized aches. Pt reports eye sore with opening and closing them. Pt reports head is sore. Pt reports ribs sore from coughing so much.

## 2014-03-25 NOTE — ED Notes (Signed)
Cough x 3 wks; body aches; chest congestion; given shot a wk ago and placed on ampicillin but pt states not much better

## 2014-03-25 NOTE — ED Provider Notes (Signed)
CSN: [id number]     Arrival date & time 03/25/14  1008 History   First MD Initiated Contact with Patient 03/25/14 1112     Chief Complaint  Patient presents with  . Cough     (Consider location/radiation/quality/duration/timing/severity/associated sxs/prior Treatment) Patient is a 80 y.o. male presenting with cough. The history is provided by the patient.  Cough Cough characteristics:  Productive Sputum characteristics:  Yellow Severity:  Mild Onset quality:  Gradual Duration:  3 weeks Timing:  Constant Progression:  Unchanged Chronicity:  New Smoker: no   Context comment:  URI Relieved by:  Nothing Worsened by:  Nothing tried Ineffective treatments:  None tried Associated symptoms: no chest pain, no fever, no headaches, no rhinorrhea and no shortness of breath     Past Medical History  Diagnosis Date  . Hypertension   . Asthma     in childhood  . Shortness of breath   . Arthritis    Past Surgical History  Procedure Laterality Date  . Ballon angioplasty  03/18/2012  . Knee surgery  x 2   . Back surgery    . Right wrist    . Left ankle    . Appendectomy     Family History  Problem Relation Age of Onset  . Dementia Mother   . Cancer Father    History  Substance Use Topics  . Smoking status: Former Research scientist (life sciences)  . Smokeless tobacco: Never Used  . Alcohol Use: Yes     Comment: rare    Review of Systems  Constitutional: Negative for fever.  HENT: Negative for drooling and rhinorrhea.   Eyes: Negative for pain.  Respiratory: Positive for cough. Negative for shortness of breath.   Cardiovascular: Negative for chest pain and leg swelling.  Gastrointestinal: Negative for nausea, vomiting, abdominal pain and diarrhea.  Genitourinary: Negative for dysuria and hematuria.  Musculoskeletal: Negative for gait problem and neck pain.  Skin: Negative for color change.  Neurological: Negative for numbness and headaches.  Hematological: Negative for adenopathy.   Psychiatric/Behavioral: Negative for behavioral problems.  All other systems reviewed and are negative.     Allergies  Review of patient's allergies indicates no known allergies.  Home Medications   Prior to Admission medications   Medication Sig Start Date End Date Taking? Authorizing Provider  amLODipine (NORVASC) 5 MG tablet Take 5 mg by mouth daily.    Yes Historical Provider, MD  amoxicillin (AMOXIL) 500 MG capsule Take 500 mg by mouth every 12 (twelve) hours. For 10 days 03/16/14  Yes Historical Provider, MD  aspirin EC 81 MG tablet Take 81 mg by mouth daily.   Yes Historical Provider, MD  Cholecalciferol (VITAMIN D PO) Take 2,000 mg by mouth 2 (two) times daily.    Yes Historical Provider, MD  guaifenesin (MUCUS RELIEF) 400 MG TABS tablet Take 400 mg by mouth every 4 (four) hours.   Yes Historical Provider, MD  mirabegron ER (MYRBETRIQ) 25 MG TB24 tablet Take 25 mg by mouth at bedtime.   Yes Historical Provider, MD  Multiple Vitamin (MULTIVITAMIN WITH MINERALS) TABS tablet Take 1 tablet by mouth daily.   Yes Historical Provider, MD  Naftifine HCl 2 % GEL Apply Naftin gel to the affected areas between toes 4 and 5 both feet twice daily 12/18/13  Yes Harriet Masson, DPM  olmesartan-hydrochlorothiazide (BENICAR HCT) 40-12.5 MG per tablet Take 1 tablet by mouth daily.   Yes Historical Provider, MD  Omega-3 Fatty Acids (FISH OIL) 1200 MG CAPS Take 1  capsule by mouth 2 (two) times daily.   Yes Historical Provider, MD  omeprazole (PRILOSEC) 40 MG capsule Take 40 mg by mouth daily.   Yes Historical Provider, MD  pravastatin (PRAVACHOL) 40 MG tablet Take 40 mg by mouth daily.   Yes Historical Provider, MD  timolol (BETIMOL) 0.5 % ophthalmic solution Place 1 drop into both eyes daily.    Yes Historical Provider, MD   BP 124/68  Pulse 64  Temp(Src) 98.1 F (36.7 C) (Oral)  Resp 16  SpO2 97% Physical Exam  Nursing note and vitals reviewed. Constitutional: He is oriented to person,  place, and time. He appears well-developed and well-nourished.  HENT:  Head: Normocephalic and atraumatic.  Right Ear: External ear normal.  Left Ear: External ear normal.  Nose: Nose normal.  Mouth/Throat: Oropharynx is clear and moist. No oropharyngeal exudate.  Eyes: Conjunctivae and EOM are normal. Pupils are equal, round, and reactive to light.  Neck: Normal range of motion. Neck supple.  Cardiovascular: Normal rate, regular rhythm, normal heart sounds and intact distal pulses.  Exam reveals no gallop and no friction rub.   No murmur heard. Pulmonary/Chest: Effort normal and breath sounds normal. No respiratory distress. He has no wheezes.  Abdominal: Soft. Bowel sounds are normal. He exhibits no distension. There is no tenderness. There is no rebound and no guarding.  Musculoskeletal: Normal range of motion. He exhibits no edema and no tenderness.  Neurological: He is alert and oriented to person, place, and time.  Skin: Skin is warm and dry.  Psychiatric: He has a normal mood and affect. His behavior is normal.    ED Course  Procedures (including critical care time) Labs Review Labs Reviewed - No data to display  Imaging Review Dg Chest 2 View  03/25/2014   CLINICAL DATA:  Cough, congestion  EXAM: CHEST  2 VIEW  COMPARISON:  CT chest 07/22/2006  FINDINGS: There is calcified left pleural plaques again noted. There is no focal parenchymal opacity, pleural effusion, or pneumothorax. The heart and mediastinal contours are unremarkable.  The osseous structures are unremarkable.  IMPRESSION: No active cardiopulmonary disease.   Electronically Signed   By: Kathreen Devoid   On: 03/25/2014 11:33     EKG Interpretation None      MDM   Final diagnoses:  Cough    11:46 AM 80 y.o. male who presents with productive cough for 3 weeks. He also notes nasal congestion. He denies any fevers, shortness of breath, chest pain, vomiting, or diarrhea. He is afebrile and vital signs are  unremarkable here.  1:00 PM: CXR neg. Pt on mucinex. Will add tussionex qhs to help pt sleep.  I have discussed the diagnosis/risks/treatment options with the patient and family and believe the pt to be eligible for discharge home to follow-up with pcp as needed. We also discussed returning to the ED immediately if new or worsening sx occur. We discussed the sx which are most concerning (e.g., worsening cough, cp, sob, fever) that necessitate immediate return. Medications administered to the patient during their visit and any new prescriptions provided to the patient are listed below.    Blanchard Kelch, MD 03/25/14 2039

## 2014-03-30 DIAGNOSIS — R413 Other amnesia: Secondary | ICD-10-CM | POA: Diagnosis not present

## 2014-03-30 DIAGNOSIS — R7309 Other abnormal glucose: Secondary | ICD-10-CM | POA: Diagnosis not present

## 2014-03-30 DIAGNOSIS — R350 Frequency of micturition: Secondary | ICD-10-CM | POA: Diagnosis not present

## 2014-03-30 DIAGNOSIS — R112 Nausea with vomiting, unspecified: Secondary | ICD-10-CM | POA: Diagnosis not present

## 2014-03-30 DIAGNOSIS — E860 Dehydration: Secondary | ICD-10-CM | POA: Diagnosis not present

## 2014-03-30 DIAGNOSIS — R630 Anorexia: Secondary | ICD-10-CM | POA: Diagnosis not present

## 2014-03-30 DIAGNOSIS — D649 Anemia, unspecified: Secondary | ICD-10-CM | POA: Diagnosis not present

## 2014-03-31 ENCOUNTER — Emergency Department (HOSPITAL_COMMUNITY): Payer: Medicare Other

## 2014-03-31 ENCOUNTER — Encounter (HOSPITAL_COMMUNITY): Payer: Self-pay | Admitting: Emergency Medicine

## 2014-03-31 ENCOUNTER — Inpatient Hospital Stay (HOSPITAL_COMMUNITY)
Admission: EM | Admit: 2014-03-31 | Discharge: 2014-04-05 | DRG: 557 | Disposition: A | Discharge status: Skilled Nursing Facility | Payer: Medicare Other | Attending: Internal Medicine | Admitting: Internal Medicine

## 2014-03-31 DIAGNOSIS — N179 Acute kidney failure, unspecified: Secondary | ICD-10-CM | POA: Diagnosis present

## 2014-03-31 DIAGNOSIS — E43 Unspecified severe protein-calorie malnutrition: Secondary | ICD-10-CM

## 2014-03-31 DIAGNOSIS — R509 Fever, unspecified: Secondary | ICD-10-CM | POA: Diagnosis present

## 2014-03-31 DIAGNOSIS — W19XXXA Unspecified fall, initial encounter: Secondary | ICD-10-CM | POA: Diagnosis present

## 2014-03-31 DIAGNOSIS — I2510 Atherosclerotic heart disease of native coronary artery without angina pectoris: Secondary | ICD-10-CM | POA: Diagnosis not present

## 2014-03-31 DIAGNOSIS — E876 Hypokalemia: Secondary | ICD-10-CM | POA: Diagnosis present

## 2014-03-31 DIAGNOSIS — Y92009 Unspecified place in unspecified non-institutional (private) residence as the place of occurrence of the external cause: Secondary | ICD-10-CM

## 2014-03-31 DIAGNOSIS — R7401 Elevation of levels of liver transaminase levels: Secondary | ICD-10-CM | POA: Diagnosis present

## 2014-03-31 DIAGNOSIS — D62 Acute posthemorrhagic anemia: Secondary | ICD-10-CM | POA: Diagnosis present

## 2014-03-31 DIAGNOSIS — T368X5A Adverse effect of other systemic antibiotics, initial encounter: Secondary | ICD-10-CM | POA: Diagnosis not present

## 2014-03-31 DIAGNOSIS — I4510 Unspecified right bundle-branch block: Secondary | ICD-10-CM | POA: Diagnosis present

## 2014-03-31 DIAGNOSIS — M545 Low back pain, unspecified: Secondary | ICD-10-CM | POA: Diagnosis present

## 2014-03-31 DIAGNOSIS — R32 Unspecified urinary incontinence: Secondary | ICD-10-CM | POA: Diagnosis not present

## 2014-03-31 DIAGNOSIS — M6282 Rhabdomyolysis: Principal | ICD-10-CM

## 2014-03-31 DIAGNOSIS — S79929A Unspecified injury of unspecified thigh, initial encounter: Secondary | ICD-10-CM | POA: Diagnosis not present

## 2014-03-31 DIAGNOSIS — IMO0002 Reserved for concepts with insufficient information to code with codable children: Secondary | ICD-10-CM

## 2014-03-31 DIAGNOSIS — Z79899 Other long term (current) drug therapy: Secondary | ICD-10-CM | POA: Diagnosis not present

## 2014-03-31 DIAGNOSIS — R059 Cough, unspecified: Secondary | ICD-10-CM | POA: Diagnosis not present

## 2014-03-31 DIAGNOSIS — R740 Nonspecific elevation of levels of transaminase and lactic acid dehydrogenase [LDH]: Secondary | ICD-10-CM

## 2014-03-31 DIAGNOSIS — N170 Acute kidney failure with tubular necrosis: Secondary | ICD-10-CM | POA: Diagnosis not present

## 2014-03-31 DIAGNOSIS — R7402 Elevation of levels of lactic acid dehydrogenase (LDH): Secondary | ICD-10-CM | POA: Diagnosis present

## 2014-03-31 DIAGNOSIS — R404 Transient alteration of awareness: Secondary | ICD-10-CM | POA: Diagnosis not present

## 2014-03-31 DIAGNOSIS — Z6825 Body mass index (BMI) 25.0-25.9, adult: Secondary | ICD-10-CM | POA: Diagnosis not present

## 2014-03-31 DIAGNOSIS — M6281 Muscle weakness (generalized): Secondary | ICD-10-CM | POA: Diagnosis not present

## 2014-03-31 DIAGNOSIS — Z9181 History of falling: Secondary | ICD-10-CM | POA: Diagnosis not present

## 2014-03-31 DIAGNOSIS — E860 Dehydration: Secondary | ICD-10-CM | POA: Diagnosis present

## 2014-03-31 DIAGNOSIS — M129 Arthropathy, unspecified: Secondary | ICD-10-CM | POA: Diagnosis present

## 2014-03-31 DIAGNOSIS — S0990XA Unspecified injury of head, initial encounter: Secondary | ICD-10-CM | POA: Diagnosis not present

## 2014-03-31 DIAGNOSIS — Z87891 Personal history of nicotine dependence: Secondary | ICD-10-CM

## 2014-03-31 DIAGNOSIS — A419 Sepsis, unspecified organism: Secondary | ICD-10-CM | POA: Diagnosis not present

## 2014-03-31 DIAGNOSIS — R51 Headache: Secondary | ICD-10-CM | POA: Diagnosis present

## 2014-03-31 DIAGNOSIS — E785 Hyperlipidemia, unspecified: Secondary | ICD-10-CM | POA: Diagnosis not present

## 2014-03-31 DIAGNOSIS — R269 Unspecified abnormalities of gait and mobility: Secondary | ICD-10-CM | POA: Diagnosis not present

## 2014-03-31 DIAGNOSIS — I10 Essential (primary) hypertension: Secondary | ICD-10-CM | POA: Diagnosis present

## 2014-03-31 DIAGNOSIS — M25559 Pain in unspecified hip: Secondary | ICD-10-CM | POA: Diagnosis present

## 2014-03-31 DIAGNOSIS — E869 Volume depletion, unspecified: Secondary | ICD-10-CM | POA: Diagnosis not present

## 2014-03-31 DIAGNOSIS — I150 Renovascular hypertension: Secondary | ICD-10-CM

## 2014-03-31 DIAGNOSIS — R05 Cough: Secondary | ICD-10-CM

## 2014-03-31 DIAGNOSIS — R079 Chest pain, unspecified: Secondary | ICD-10-CM | POA: Diagnosis present

## 2014-03-31 DIAGNOSIS — K219 Gastro-esophageal reflux disease without esophagitis: Secondary | ICD-10-CM | POA: Diagnosis not present

## 2014-03-31 DIAGNOSIS — S0993XA Unspecified injury of face, initial encounter: Secondary | ICD-10-CM | POA: Diagnosis not present

## 2014-03-31 DIAGNOSIS — S79919A Unspecified injury of unspecified hip, initial encounter: Secondary | ICD-10-CM | POA: Diagnosis not present

## 2014-03-31 DIAGNOSIS — R5381 Other malaise: Secondary | ICD-10-CM | POA: Diagnosis not present

## 2014-03-31 DIAGNOSIS — R062 Wheezing: Secondary | ICD-10-CM | POA: Diagnosis not present

## 2014-03-31 DIAGNOSIS — M629 Disorder of muscle, unspecified: Secondary | ICD-10-CM | POA: Diagnosis not present

## 2014-03-31 DIAGNOSIS — M542 Cervicalgia: Secondary | ICD-10-CM | POA: Diagnosis not present

## 2014-03-31 LAB — COMPREHENSIVE METABOLIC PANEL
ALBUMIN: 3 g/dL — AB (ref 3.5–5.2)
ALT: 22 U/L (ref 0–53)
AST: 61 U/L — AB (ref 0–37)
Alkaline Phosphatase: 70 U/L (ref 39–117)
BILIRUBIN TOTAL: 0.9 mg/dL (ref 0.3–1.2)
BUN: 30 mg/dL — ABNORMAL HIGH (ref 6–23)
CO2: 20 mEq/L (ref 19–32)
CREATININE: 1.37 mg/dL — AB (ref 0.50–1.35)
Calcium: 8.8 mg/dL (ref 8.4–10.5)
Chloride: 101 mEq/L (ref 96–112)
GFR calc Af Amer: 53 mL/min — ABNORMAL LOW (ref >90)
GFR calc non Af Amer: 45 mL/min — ABNORMAL LOW (ref >90)
Glucose, Bld: 122 mg/dL — ABNORMAL HIGH (ref 70–99)
POTASSIUM: 3.9 meq/L (ref 3.7–5.3)
Sodium: 136 mEq/L — ABNORMAL LOW (ref 137–147)
TOTAL PROTEIN: 6.5 g/dL (ref 6.0–8.3)

## 2014-03-31 LAB — URINALYSIS, ROUTINE W REFLEX MICROSCOPIC
BILIRUBIN URINE: NEGATIVE
BILIRUBIN URINE: NEGATIVE
GLUCOSE, UA: NEGATIVE mg/dL
Glucose, UA: NEGATIVE mg/dL
KETONES UR: NEGATIVE mg/dL
Ketones, ur: NEGATIVE mg/dL
LEUKOCYTES UA: NEGATIVE
Leukocytes, UA: NEGATIVE
NITRITE: NEGATIVE
Nitrite: NEGATIVE
PH: 5.5 (ref 5.0–8.0)
PROTEIN: NEGATIVE mg/dL
Protein, ur: NEGATIVE mg/dL
Specific Gravity, Urine: 1.011 (ref 1.005–1.030)
Specific Gravity, Urine: 1.014 (ref 1.005–1.030)
Urobilinogen, UA: 0.2 mg/dL (ref 0.0–1.0)
Urobilinogen, UA: 0.2 mg/dL (ref 0.0–1.0)
pH: 6 (ref 5.0–8.0)

## 2014-03-31 LAB — I-STAT CG4 LACTIC ACID, ED
LACTIC ACID, VENOUS: 2.59 mmol/L — AB (ref 0.5–2.2)
Lactic Acid, Venous: 2.15 mmol/L (ref 0.5–2.2)

## 2014-03-31 LAB — BASIC METABOLIC PANEL
BUN: 37 mg/dL — AB (ref 6–23)
CHLORIDE: 96 meq/L (ref 96–112)
CO2: 22 mEq/L (ref 19–32)
Calcium: 9.7 mg/dL (ref 8.4–10.5)
Creatinine, Ser: 1.54 mg/dL — ABNORMAL HIGH (ref 0.50–1.35)
GFR calc Af Amer: 46 mL/min — ABNORMAL LOW (ref >90)
GFR calc non Af Amer: 39 mL/min — ABNORMAL LOW (ref >90)
Glucose, Bld: 126 mg/dL — ABNORMAL HIGH (ref 70–99)
POTASSIUM: 4.1 meq/L (ref 3.7–5.3)
SODIUM: 135 meq/L — AB (ref 137–147)

## 2014-03-31 LAB — CBC WITH DIFFERENTIAL/PLATELET
BASOS PCT: 0 % (ref 0–1)
BASOS PCT: 0 % (ref 0–1)
Basophils Absolute: 0 10*3/uL (ref 0.0–0.1)
Basophils Absolute: 0 10*3/uL (ref 0.0–0.1)
Eosinophils Absolute: 0 10*3/uL (ref 0.0–0.7)
Eosinophils Absolute: 0 10*3/uL (ref 0.0–0.7)
Eosinophils Relative: 0 % (ref 0–5)
Eosinophils Relative: 0 % (ref 0–5)
HCT: 34.4 % — ABNORMAL LOW (ref 39.0–52.0)
HEMATOCRIT: 33.1 % — AB (ref 39.0–52.0)
HEMOGLOBIN: 11.8 g/dL — AB (ref 13.0–17.0)
Hemoglobin: 12.5 g/dL — ABNORMAL LOW (ref 13.0–17.0)
LYMPHS ABS: 0.5 10*3/uL — AB (ref 0.7–4.0)
Lymphocytes Relative: 7 % — ABNORMAL LOW (ref 12–46)
Lymphocytes Relative: 12 % (ref 12–46)
Lymphs Abs: 0.9 10*3/uL (ref 0.7–4.0)
MCH: 34.2 pg — ABNORMAL HIGH (ref 26.0–34.0)
MCH: 33.9 pg (ref 26.0–34.0)
MCHC: 36.3 g/dL — AB (ref 30.0–36.0)
MCHC: 35.6 g/dL (ref 30.0–36.0)
MCV: 94.2 fL (ref 78.0–100.0)
MCV: 95.1 fL (ref 78.0–100.0)
MONO ABS: 0.8 10*3/uL (ref 0.1–1.0)
MONOS PCT: 9 % (ref 3–12)
MONOS PCT: 10 % (ref 3–12)
Monocytes Absolute: 0.6 10*3/uL (ref 0.1–1.0)
NEUTROS ABS: 6.1 10*3/uL (ref 1.7–7.7)
NEUTROS PCT: 84 % — AB (ref 43–77)
NEUTROS PCT: 77 % (ref 43–77)
Neutro Abs: 5.7 10*3/uL (ref 1.7–7.7)
Platelets: 220 10*3/uL (ref 150–400)
Platelets: 193 10*3/uL (ref 150–400)
RBC: 3.65 MIL/uL — AB (ref 4.22–5.81)
RBC: 3.48 MIL/uL — AB (ref 4.22–5.81)
RDW: 12 % (ref 11.5–15.5)
RDW: 12.3 % (ref 11.5–15.5)
WBC: 7.3 10*3/uL (ref 4.0–10.5)
WBC: 7.4 10*3/uL (ref 4.0–10.5)

## 2014-03-31 LAB — URINE MICROSCOPIC-ADD ON

## 2014-03-31 LAB — MAGNESIUM: Magnesium: 1.6 mg/dL (ref 1.5–2.5)

## 2014-03-31 LAB — I-STAT TROPONIN, ED: Troponin i, poc: 0.03 ng/mL (ref 0.00–0.08)

## 2014-03-31 LAB — CK TOTAL AND CKMB (NOT AT ARMC)
CK, MB: 6.8 ng/mL — AB (ref 0.3–4.0)
Total CK: 1623 U/L — ABNORMAL HIGH (ref 7–232)

## 2014-03-31 LAB — TSH: TSH: 0.928 u[IU]/mL (ref 0.350–4.500)

## 2014-03-31 LAB — PHOSPHORUS: PHOSPHORUS: 3.8 mg/dL (ref 2.3–4.6)

## 2014-03-31 LAB — PROTIME-INR
INR: 1.23 (ref 0.00–1.49)
Prothrombin Time: 15.2 seconds (ref 11.6–15.2)

## 2014-03-31 LAB — APTT: aPTT: 26 seconds (ref 24–37)

## 2014-03-31 MED ORDER — MIRABEGRON ER 25 MG PO TB24
25.0000 mg | ORAL_TABLET | Freq: Every day | ORAL | Status: DC
  Administered 2014-03-31 – 2014-04-04 (×5): 25 mg via ORAL
  Filled 2014-03-31 (×6): qty 1

## 2014-03-31 MED ORDER — AMLODIPINE BESYLATE 5 MG PO TABS
5.0000 mg | ORAL_TABLET | Freq: Every day | ORAL | Status: DC
  Administered 2014-03-31: 5 mg via ORAL
  Filled 2014-03-31: qty 1

## 2014-03-31 MED ORDER — SIMVASTATIN 20 MG PO TABS
20.0000 mg | ORAL_TABLET | Freq: Every day | ORAL | Status: DC
  Administered 2014-03-31: 20 mg via ORAL
  Filled 2014-03-31 (×2): qty 1

## 2014-03-31 MED ORDER — SODIUM CHLORIDE 0.9 % IV SOLN
1000.0000 mL | INTRAVENOUS | Status: DC
  Administered 2014-03-31: 1000 mL via INTRAVENOUS

## 2014-03-31 MED ORDER — TIMOLOL HEMIHYDRATE 0.5 % OP SOLN: 1.0000 [drp] | Freq: Every day | OPHTHALMIC | Status: DC

## 2014-03-31 MED ORDER — ACETAMINOPHEN 650 MG RE SUPP
650.0000 mg | Freq: Once | RECTAL | Status: AC
  Administered 2014-03-31: 650 mg via RECTAL
  Filled 2014-03-31: qty 1

## 2014-03-31 MED ORDER — ADULT MULTIVITAMIN W/MINERALS CH
1.0000 | ORAL_TABLET | Freq: Every day | ORAL | Status: DC
  Administered 2014-03-31 – 2014-04-05 (×6): 1 via ORAL
  Filled 2014-03-31 (×8): qty 1

## 2014-03-31 MED ORDER — SODIUM CHLORIDE 0.9 % IV BOLUS (SEPSIS)
500.0000 mL | Freq: Once | INTRAVENOUS | Status: AC
  Administered 2014-03-31: 500 mL via INTRAVENOUS

## 2014-03-31 MED ORDER — ONDANSETRON HCL 4 MG/2ML IJ SOLN
4.0000 mg | Freq: Four times a day (QID) | INTRAMUSCULAR | Status: DC | PRN
Start: 2014-03-31 — End: 2014-04-05

## 2014-03-31 MED ORDER — SODIUM CHLORIDE 0.9 % IV SOLN
1000.0000 mL | Freq: Once | INTRAVENOUS | Status: AC
  Administered 2014-03-31: 1000 mL via INTRAVENOUS

## 2014-03-31 MED ORDER — VANCOMYCIN HCL IN DEXTROSE 1-5 GM/200ML-% IV SOLN
1000.0000 mg | Freq: Once | INTRAVENOUS | Status: AC
Start: 2014-03-31 — End: 2014-03-31
  Administered 2014-03-31: 1000 mg via INTRAVENOUS
  Filled 2014-03-31: qty 200

## 2014-03-31 MED ORDER — ALBUTEROL SULFATE (2.5 MG/3ML) 0.083% IN NEBU: 3.0000 mL | INHALATION_SOLUTION | Freq: Four times a day (QID) | RESPIRATORY_TRACT | Status: DC | PRN

## 2014-03-31 MED ORDER — HYDROCOD POLST-CHLORPHEN POLST 10-8 MG/5ML PO LQCR: 5.0000 mL | Freq: Every evening | ORAL | Status: DC | PRN

## 2014-03-31 MED ORDER — SODIUM CHLORIDE 0.9 % IJ SOLN
3.0000 mL | Freq: Two times a day (BID) | INTRAMUSCULAR | Status: DC
  Administered 2014-03-31 – 2014-04-03 (×4): 3 mL via INTRAVENOUS

## 2014-03-31 MED ORDER — PIPERACILLIN-TAZOBACTAM 3.375 G IVPB
3.3750 g | Freq: Three times a day (TID) | INTRAVENOUS | Status: DC
  Administered 2014-03-31 – 2014-04-02 (×5): 3.375 g via INTRAVENOUS
  Filled 2014-03-31 (×6): qty 50

## 2014-03-31 MED ORDER — HYDROCODONE-ACETAMINOPHEN 5-325 MG PO TABS
1.0000 | ORAL_TABLET | ORAL | Status: DC | PRN
  Administered 2014-04-03: 1 via ORAL
  Filled 2014-03-31: qty 1

## 2014-03-31 MED ORDER — ASPIRIN EC 81 MG PO TBEC
81.0000 mg | DELAYED_RELEASE_TABLET | Freq: Every day | ORAL | Status: DC
  Administered 2014-03-31 – 2014-04-05 (×6): 81 mg via ORAL
  Filled 2014-03-31 (×8): qty 1

## 2014-03-31 MED ORDER — CALCIUM CARBONATE-VITAMIN D 500-200 MG-UNIT PO TABS
1.0000 | ORAL_TABLET | Freq: Two times a day (BID) | ORAL | Status: DC
  Administered 2014-03-31 – 2014-04-05 (×10): 1 via ORAL
  Filled 2014-03-31 (×13): qty 1

## 2014-03-31 MED ORDER — ACETAMINOPHEN 325 MG PO TABS
650.0000 mg | ORAL_TABLET | Freq: Four times a day (QID) | ORAL | Status: DC | PRN
  Administered 2014-04-01: 650 mg via ORAL
  Filled 2014-03-31: qty 2

## 2014-03-31 MED ORDER — GUAIFENESIN 200 MG PO TABS
400.0000 mg | ORAL_TABLET | ORAL | Status: DC
  Administered 2014-03-31 – 2014-04-05 (×25): 400 mg via ORAL
  Filled 2014-03-31 (×34): qty 2

## 2014-03-31 MED ORDER — PANTOPRAZOLE SODIUM 40 MG PO TBEC
40.0000 mg | DELAYED_RELEASE_TABLET | Freq: Every day | ORAL | Status: DC
  Administered 2014-03-31 – 2014-04-05 (×6): 40 mg via ORAL
  Filled 2014-03-31 (×8): qty 1

## 2014-03-31 MED ORDER — PIPERACILLIN-TAZOBACTAM 3.375 G IVPB 30 MIN
3.3750 g | Freq: Once | INTRAVENOUS | Status: AC
Start: 2014-03-31 — End: 2014-03-31
  Administered 2014-03-31: 3.375 g via INTRAVENOUS
  Filled 2014-03-31: qty 50

## 2014-03-31 MED ORDER — VITAMIN D (ERGOCALCIFEROL) 1.25 MG (50000 UNIT) PO CAPS
50000.0000 [IU] | ORAL_CAPSULE | ORAL | Status: DC
  Administered 2014-04-05: 50000 [IU] via ORAL
  Filled 2014-03-31: qty 1

## 2014-03-31 MED ORDER — SODIUM CHLORIDE 0.9 % IV SOLN
INTRAVENOUS | Status: DC
  Administered 2014-04-01: 05:00:00 via INTRAVENOUS

## 2014-03-31 MED ORDER — VANCOMYCIN HCL IN DEXTROSE 1-5 GM/200ML-% IV SOLN
1000.0000 mg | INTRAVENOUS | Status: DC
  Administered 2014-04-01: 1000 mg via INTRAVENOUS
  Filled 2014-03-31 (×2): qty 200

## 2014-03-31 MED ORDER — ONDANSETRON HCL 4 MG PO TABS: 4.0000 mg | ORAL_TABLET | Freq: Four times a day (QID) | ORAL | Status: DC | PRN

## 2014-03-31 MED ORDER — BUDESONIDE-FORMOTEROL FUMARATE 160-4.5 MCG/ACT IN AERO
1.0000 | INHALATION_SPRAY | Freq: Two times a day (BID) | RESPIRATORY_TRACT | Status: DC
  Administered 2014-03-31 – 2014-04-05 (×10): 1 via RESPIRATORY_TRACT
  Filled 2014-03-31: qty 6

## 2014-03-31 MED ORDER — ACETAMINOPHEN 650 MG RE SUPP: 650.0000 mg | Freq: Four times a day (QID) | RECTAL | Status: DC | PRN

## 2014-03-31 MED ORDER — FLUTICASONE PROPIONATE 50 MCG/ACT NA SUSP: 1.0000 | Freq: Every day | NASAL | Status: DC | PRN

## 2014-03-31 NOTE — ED Provider Notes (Signed)
Patient fell last night, laying on the floor for 12 hours. He reports slight cough for the past several days and generalized weakness. He hit his head as result of result fall. But presently complains of headache no other associated symptoms. On exam moderately ill appearing, tremulous HEENT exam normocephalic atraumatic neck supple trachea midline lungs clear auscultation abdomen nondistended nontender. Stable nontender. He has no pain on internal rotation of either thigh. All 4 extremities a contusion abrasion or tenderness neurovascular intact. Paraspinal tender.  Orlie Dakin, MD 03/31/14 1102

## 2014-03-31 NOTE — ED Notes (Signed)
Per EMS, pt from home.  Pt fell last night and was found this morning in living room.  Pt down approx 12 hours.  Pt found by his son.  Pt seen here recently for cough.  Pt alert and oriented.  Pt states he lost his balance while trying to put pj's on.  Pt c/o weakness.  Denies pain.  Hx:  HTN.  Lives alone. Vitals:  154/80, hr 70, resp 18.  99 temp.

## 2014-03-31 NOTE — ED Notes (Signed)
Pt to radiology.

## 2014-03-31 NOTE — ED Notes (Signed)
Bed: [id number] Expected date:  Expected time:  Means of arrival:  Comments: EMS elderly/dehydrated

## 2014-03-31 NOTE — ED Provider Notes (Signed)
Medical screening examination/treatment/procedure(s) were conducted as a shared visit with non-physician practitioner(s) and myself.  I personally evaluated the patient during the encounter.   EKG Interpretation   Date/Time:  Wednesday March 31 2014 10:22:13 EDT Ventricular Rate:  67 PR Interval:    QRS Duration: 146 QT Interval:  414 QTC Calculation: 437 R Axis:   63 Text Interpretation:  Normal sinus rhythm Right bundle branch block No  significant change since last tracing Confirmed by Winfred Leeds  MD, Annette Bertelson  ([id number]) on 03/31/2014 10:26:04 AM       Orlie Dakin, MD 03/31/14 1646

## 2014-03-31 NOTE — ED Provider Notes (Signed)
CSN: [id number]     Arrival date & time 03/31/14  [id number] History   First MD Initiated Contact with Patient 03/31/14 1001     Chief Complaint  Patient presents with  . Fall  . Dehydration     (Consider location/radiation/quality/duration/timing/severity/associated sxs/prior Treatment) The history is provided by the patient and medical records.   This is an 80 year old male with past medical history significant for hypertension and arthritis, presenting to the ED for fall.  Patient states last night he was seen by his primary care physician for persistent cough and dehydration, he was given daily or fluid in the office and discharged home and instructed to continue drinking fluids. He states he had to get up several times to use the restroom, and on last visit to bathroom he lost his footing and fell.  He endorses head trauma and questionable LOC.  Pt states he was unable to get up so he layed in the floor for 12+ hours until his son found him this morning.  Pt admits to a mild headache, left hip, low back, and mild chest pain.  He states his mouth still feels very dry despite fluids yesterday.  He is shivering on arrival-- states he has had this for the past 2-3 months.  Seen in the ED on 03/25/14 for cough with negative CXR at that time-- continues to have productive cough.    Past Medical History  Diagnosis Date  . Hypertension   . Asthma     in childhood  . Shortness of breath   . Arthritis    Past Surgical History  Procedure Laterality Date  . Ballon angioplasty  03/18/2012  . Knee surgery  x 2   . Back surgery    . Right wrist    . Left ankle    . Appendectomy     Family History  Problem Relation Age of Onset  . Dementia Mother   . Cancer Father    History  Substance Use Topics  . Smoking status: Former Research scientist (life sciences)  . Smokeless tobacco: Never Used  . Alcohol Use: Yes     Comment: rare    Review of Systems  Constitutional: Positive for chills.  Cardiovascular: Positive for  chest pain.  Musculoskeletal: Positive for arthralgias and back pain.  All other systems reviewed and are negative.     Allergies  Review of patient's allergies indicates no known allergies.  Home Medications   Prior to Admission medications   Medication Sig Start Date End Date Taking? Authorizing Provider  amLODipine (NORVASC) 5 MG tablet Take 5 mg by mouth daily.     Historical Provider, MD  amoxicillin (AMOXIL) 500 MG capsule Take 500 mg by mouth every 12 (twelve) hours. For 10 days 03/16/14   Historical Provider, MD  aspirin EC 81 MG tablet Take 81 mg by mouth daily.    Historical Provider, MD  chlorpheniramine-HYDROcodone (TUSSIONEX PENNKINETIC ER) 10-8 MG/5ML LQCR Take 5 mLs by mouth at bedtime as needed for cough. 03/25/14   Blanchard Kelch, MD  Cholecalciferol (VITAMIN D PO) Take 2,000 mg by mouth 2 (two) times daily.     Historical Provider, MD  guaifenesin (MUCUS RELIEF) 400 MG TABS tablet Take 400 mg by mouth every 4 (four) hours.    Historical Provider, MD  mirabegron ER (MYRBETRIQ) 25 MG TB24 tablet Take 25 mg by mouth at bedtime.    Historical Provider, MD  Multiple Vitamin (MULTIVITAMIN WITH MINERALS) TABS tablet Take 1 tablet by mouth daily.  Historical Provider, MD  Naftifine HCl 2 % GEL Apply Naftin gel to the affected areas between toes 4 and 5 both feet twice daily 12/18/13   Harriet Masson, DPM  olmesartan-hydrochlorothiazide (BENICAR HCT) 40-12.5 MG per tablet Take 1 tablet by mouth daily.    Historical Provider, MD  Omega-3 Fatty Acids (FISH OIL) 1200 MG CAPS Take 1 capsule by mouth 2 (two) times daily.    Historical Provider, MD  omeprazole (PRILOSEC) 40 MG capsule Take 40 mg by mouth daily.    Historical Provider, MD  pravastatin (PRAVACHOL) 40 MG tablet Take 40 mg by mouth daily.    Historical Provider, MD  timolol (BETIMOL) 0.5 % ophthalmic solution Place 1 drop into both eyes daily.     Historical Provider, MD   BP 138/105  Pulse 70  Temp(Src) 100 F (37.8  C) (Oral)  Resp 16  SpO2 99%  Physical Exam  Nursing note and vitals reviewed. Constitutional: He is oriented to person, place, and time. He appears well-developed and well-nourished. No distress.  Actively shivering  HENT:  Head: Normocephalic and atraumatic.  Mouth/Throat: Uvula is midline and oropharynx is clear and moist. Mucous membranes are dry.  Dry mucous membranes  Eyes: Conjunctivae and EOM are normal. Pupils are equal, round, and reactive to light.  Neck: Normal range of motion. Neck supple.  Cardiovascular: Normal rate, regular rhythm and normal heart sounds.   Pulmonary/Chest: Effort normal and breath sounds normal. No respiratory distress. He has no wheezes.  Abdominal: Soft. Bowel sounds are normal.  Musculoskeletal: Normal range of motion.       Left hip: He exhibits tenderness and bony tenderness.       Lumbar back: He exhibits tenderness and bony tenderness.  Mild tenderness of left hip without gross deformity or leg shortening; sensation intact diffusely throughout lower extremities Tenderness of lumbar spine without step-off or gross deformity  Neurological: He is alert and oriented to person, place, and time.  Skin: Skin is warm and dry. He is not diaphoretic.  Psychiatric: He has a normal mood and affect.    ED Course  Procedures (including critical care time) Labs Review Labs Reviewed  CBC WITH DIFFERENTIAL - Abnormal; Notable for the following:    RBC 3.65 (*)    Hemoglobin 12.5 (*)    HCT 34.4 (*)    MCH 34.2 (*)    MCHC 36.3 (*)    Neutrophils Relative % 84 (*)    Lymphocytes Relative 7 (*)    Lymphs Abs 0.5 (*)    All other components within normal limits  BASIC METABOLIC PANEL - Abnormal; Notable for the following:    Sodium 135 (*)    Glucose, Bld 126 (*)    BUN 37 (*)    Creatinine, Ser 1.54 (*)    GFR calc non Af Amer 39 (*)    GFR calc Af Amer 46 (*)    All other components within normal limits  URINALYSIS, ROUTINE W REFLEX MICROSCOPIC  - Abnormal; Notable for the following:    Hgb urine dipstick MODERATE (*)    All other components within normal limits  I-STAT CG4 LACTIC ACID, ED - Abnormal; Notable for the following:    Lactic Acid, Venous 2.59 (*)    All other components within normal limits  CULTURE, BLOOD (ROUTINE X 2)  CULTURE, BLOOD (ROUTINE X 2)  URINE CULTURE  URINE MICROSCOPIC-ADD ON  CK TOTAL AND CKMB  I-STAT TROPOININ, ED  I-STAT CG4 LACTIC ACID, ED  Imaging Review Dg Chest 2 View  03/31/2014   CLINICAL DATA:  Fever, code sepsis.  EXAM: CHEST  2 VIEW  COMPARISON:  03/25/2014.  FINDINGS: Trachea is midline. Heart size stable. Pleural calcification is seen along the lateral base of the left hemi thorax. Biapical pleural thickening, left greater than right. Lungs are otherwise clear. No pleural fluid. No pneumothorax.  IMPRESSION: No acute findings.   Electronically Signed   By: Lorin Picket M.D.   On: 03/31/2014 12:44   Dg Lumbar Spine Complete  03/31/2014   CLINICAL DATA:  Fall yesterday.  Low back pain.  EXAM: LUMBAR SPINE - COMPLETE 4+ VIEW  COMPARISON:  None.  FINDINGS: Five lumbar type vertebral bodies. Curvature convex to the right in the thoracolumbar region and to the left in the lumbar region disc space narrowing at all levels. No evidence of fracture. No subluxation. No focal lesion.  IMPRESSION: Curvature and degenerative changes.  No acute or traumatic finding.   Electronically Signed   By: Nelson Chimes M.D.   On: 03/31/2014 12:42   Dg Hip Complete Left  03/31/2014   CLINICAL DATA:  Fall with left hip pain.  EXAM: LEFT HIP - COMPLETE 2+ VIEW  COMPARISON:  None.  FINDINGS: No fracture or dislocation. Minimal subchondral sclerosis and osteophytosis along the acetabuli bilaterally. Hip joint space is maintained. Degenerative changes are seen in the visualized portion of the lumbar spine. Obturator rings are intact.  IMPRESSION: 1. No acute findings. 2. Minimal bilateral hip osteoarthritis.    Electronically Signed   By: Lorin Picket M.D.   On: 03/31/2014 12:37   Ct Head Wo Contrast  03/31/2014   CLINICAL DATA:  Fall with head trauma. Found down. Weakness, headaches and neck pain.  EXAM: CT HEAD WITHOUT CONTRAST  CT CERVICAL SPINE WITHOUT CONTRAST  TECHNIQUE: Multidetector CT imaging of the head and cervical spine was performed following the standard protocol without intravenous contrast. Multiplanar CT image reconstructions of the cervical spine were also generated.  COMPARISON:  None.  FINDINGS: CT HEAD FINDINGS  There is a small area of rounded low-attenuation in the periphery of the right cerebral hemisphere (series 3, image 7). No additional evidence of an acute infarct, acute hemorrhage, mass lesion, mass effect or hydrocephalus. Atrophy. Periventricular low attenuation. Scattered mucosal thickening/ opacification in the ethmoid air cells. Mastoid air cells are clear. No fracture.  CT CERVICAL SPINE FINDINGS  Alignment is anatomic. Vertebral body height is maintained. Endplate degenerative changes, loss of disc space height and uncovertebral hypertrophy are seen at C3-4, C4-5, C5-6 and C6-7. Multilevel facet hypertrophy is seen as well. Visualized upper ribs appear intact. Biapical pleural parenchymal scarring, left greater than right.  IMPRESSION: 1. Small rounded area of low attenuation in the peripheral aspect of the right cerebellar hemisphere, possibly representing foliar prominence related to atrophy. Difficult to exclude an infarct. 2. No additional evidence of an acute intracranial abnormality. 3. Atrophy and chronic microvascular white matter ischemic changes. 4. Advanced cervical spondylosis without fracture or subluxation.   Electronically Signed   By: Lorin Picket M.D.   On: 03/31/2014 12:18   Ct Cervical Spine Wo Contrast  03/31/2014   CLINICAL DATA:  Fall with head trauma. Found down. Weakness, headaches and neck pain.  EXAM: CT HEAD WITHOUT CONTRAST  CT CERVICAL SPINE  WITHOUT CONTRAST  TECHNIQUE: Multidetector CT imaging of the head and cervical spine was performed following the standard protocol without intravenous contrast. Multiplanar CT image reconstructions of the cervical spine were  also generated.  COMPARISON:  None.  FINDINGS: CT HEAD FINDINGS  There is a small area of rounded low-attenuation in the periphery of the right cerebral hemisphere (series 3, image 7). No additional evidence of an acute infarct, acute hemorrhage, mass lesion, mass effect or hydrocephalus. Atrophy. Periventricular low attenuation. Scattered mucosal thickening/ opacification in the ethmoid air cells. Mastoid air cells are clear. No fracture.  CT CERVICAL SPINE FINDINGS  Alignment is anatomic. Vertebral body height is maintained. Endplate degenerative changes, loss of disc space height and uncovertebral hypertrophy are seen at C3-4, C4-5, C5-6 and C6-7. Multilevel facet hypertrophy is seen as well. Visualized upper ribs appear intact. Biapical pleural parenchymal scarring, left greater than right.  IMPRESSION: 1. Small rounded area of low attenuation in the peripheral aspect of the right cerebellar hemisphere, possibly representing foliar prominence related to atrophy. Difficult to exclude an infarct. 2. No additional evidence of an acute intracranial abnormality. 3. Atrophy and chronic microvascular white matter ischemic changes. 4. Advanced cervical spondylosis without fracture or subluxation.   Electronically Signed   By: Lorin Picket M.D.   On: 03/31/2014 12:18     EKG Interpretation None      MDM   Final diagnoses:  Rhabdomyolysis  Cough  Fever  Fall at home   80 y.o. M s/p fall last night, layed in floor for 12+ hours until found by son this morning.  Now with some complaint of left hip pain, headache, low back pain, and mild chest pain.  Concern for rhabdomyolysis.  Will obtain CT head/CS, plain films of left hip, lumbar spine, and chest.  Pt with rectal temp of 101.70F.   Will obtain CBC, CMP, lactic acid, trop, CK-MB, blood cultures, u/a, urine cultures.  Tylenol given.  Vanc/zosyn ordered per pharmacy.  Imaging negative for acute findings.  Labs as above-- pt with mild rhabdomyolysis.  Lactic acid slightly elevated at 2.59.  Pt given fluids, abx.  Will admit for further management.  Discussed with Dr. Doyle Askew who will admit to telemetry.    Larene Pickett, PA-C 03/31/14 1401

## 2014-03-31 NOTE — Progress Notes (Signed)
Pt arrived to 1503, stable condition, no complaints at this time.  Oriented to staff and room.  Son [name] at bedside, updated.  Garry Heater, RN 03/31/2014

## 2014-03-31 NOTE — Progress Notes (Signed)
  CARE MANAGEMENT ED NOTE 03/31/2014  Patient:  Wayne [name], Wayne [name]   Account Number:  [id number]  Date Initiated:  03/31/2014  Documentation initiated by:  Livia Snellen  Subjective/Objective Assessment:   Patient presents to ED post fall, generalized weaknees, cough for a few days.     Subjective/Objective Assessment Detail:   Patient with pmhx of HTN, asthma.     Action/Plan:   Action/Plan Detail:   Anticipated DC Date:       Status Recommendation to Physician:   Result of Recommendation:    Other ED Services  Consult [facility] [place]  Other    Choice offered to / List presented to:            Status of service:  Completed, signed off  ED Comments:   ED Comments Detail:  EDCM spoke to patient and his son Wayne [name] at bedside.  Patient reports he lives at home alone.  Mike's phone number [phone].  Patient's son reports he lives about 10 minuets away from the patient. Patient does no have any home health services at this time.  Patient is usually able to perform his own ADL's without difficulty and [facility] [employer] [facility]. Patient' son reports the patient just started using his cane over theoast couple of days due to balance issues. Patient's son reports the patient has a fiance', she does not live with the patient.  EDCM provided patient with list of home health agencies in [facility], printed information regarding the senior resources of [place] and the [facility].  Patient confirms his pcp is Dr. Baird Cancer, who is a patricipant in [facility].  Referral not made at this time due to patient is unsure if he would need this service. All printed resources given to patient's son.  Patient and patient's son thankful for resources.  No further EDCM needs at this time.

## 2014-03-31 NOTE — Progress Notes (Signed)
ANTIBIOTIC CONSULT NOTE - INITIAL  Pharmacy Consult for vancomycin, Zosyn Indication: rule out sepsis  No Known Allergies  Patient Measurements: Height: 5\' 7"  (170.2 cm) Weight: 155 lb (70.308 kg) IBW/kg (Calculated) : 66.1  Vital Signs: Temp: 101.3 F (38.5 C) (04/29 1038) Temp src: Rectal (04/29 1038) BP: 138/105 mmHg (04/29 0956) Pulse Rate: 70 (04/29 0956) Intake/Output from previous day:   Intake/Output from this shift: Total I/O In: -  Out: 50 [Urine:50]  Labs:  Recent Labs  03/31/14 1029  WBC 7.3  HGB 12.5*  PLT 220  CREATININE 1.54*   Estimated Creatinine Clearance: 32.8 ml/min (by C-G formula based on Cr of 1.54). No results found for this basename: VANCOTROUGH, VANCOPEAK, VANCORANDOM, GENTTROUGH, GENTPEAK, GENTRANDOM, TOBRATROUGH, TOBRAPEAK, TOBRARND, AMIKACINPEAK, AMIKACINTROU, AMIKACIN,  in the last 72 hours   Microbiology: No results found for this or any previous visit (from the past 720 hour(s)).  Medical History: Past Medical History  Diagnosis Date  . Hypertension   . Asthma     in childhood  . Shortness of breath   . Arthritis     Medications:  Scheduled:   Infusions:  . sodium chloride    . piperacillin-tazobactam 3.375 g (03/31/14 1130)  . vancomycin 1,000 mg (03/31/14 1133)   Assessment: 80 yo presents to ER after fall and dehydration. Was seen by primary Md recently for persistent cough and dehydration and instructed to drink more fluids and then he fell in bathroom and had to lay there 12+ hours. PMH HTN and arthritis. To start broad spectrum antibiotics vancomycin and Zosyn for possible sepsis. Baseline labs and vitals: Tmax of 101.3, SCr 1.54 with est CrCl 33 ml/min and WBC of 7.3  Goal of Therapy:  Vancomycin trough level 15-20 mcg/ml  Plan:  1) Vancomycin 1g IV x 1 in ER then start 1g IV q24 2) Zosyn 3.375g IV q8 (extended interval infusion)   Adrian Saran, PharmD, BCPS Pager [phone] 03/31/2014 11:53 AM

## 2014-03-31 NOTE — ED Notes (Signed)
Pt attempted to use urinal. Unsuccessful but upon turning patient, noted that sheets are saturated with urine. Information passed on to nurse taking report for patient.

## 2014-03-31 NOTE — H&P (Addendum)
[facility] History and Physical  Wayne [name] [id number] DOB: 1926-06-09 DOA: 03/31/2014  Referring physician: ED physician PCP: [name], MD   Chief Complaint: fall  HPI:  80 year old male with past medical history significant for hypertension and arthritis, presenting to the ED after an episode of fall that occurred one night prior to this admission. He explains he has been seen recently by PCP and was treated in an outpatient setting for dehydration with  IVF. He says he was getting fluids in the office nad last night he had to get up and go to the restroom and since it was very dark he fell. He was unable to get up and was down on the floor for more than 12 hours.  In ED, pt noted to be hemodynamically stable, CK elevated consistent with rhabdo and pt admitted for further evaluation.   Assessment and Plan: Active Problems:   Rhabdomyolysis - secondary to fall - admit to telemetry unit - place on IVF and repeat CK level in AM - provide supportive care with analgesia as needed    Acute renal failure - likely form pre renal etiology, rhabdo - IVF as noted above and repeat BMP in AM   HTN - blood pressure soft on admission  - will hold antihypertensive regimen that pt is on at home, Atenolol, Benicar, Norvasc    Fever - unclear etiology but with elevated lactic acid and T max 102 F agree with continuing empiric ABX Vanc and Zosyn  - urine culture and sputum culture pending  - no infectious etiology noted on CXR, UA unremarkable - provide tylenol as needed    Radiological Exams on Admission:  Dg Chest 2 View  03/31/2014  No acute findings.     Dg Lumbar Spine Complete  03/31/2014  Curvature and degenerative changes.  No acute or traumatic finding.   Dg Hip Complete Left  03/31/2014  No acute findings. Minimal bilateral hip osteoarthritis.    Ct Head Wo Contrast  03/31/2014  Small rounded area of low attenuation in the peripheral aspect of the right  cerebellar hemisphere, possibly representing foliar prominence related to atrophy. Difficult to exclude an infarct. No additional evidence of an acute intracranial abnormality. Atrophy and chronic microvascular white matter ischemic changes. Advanced cervical spondylosis without fracture or subluxation.      Code Status: Full Family Communication: Pt and wife at bedside Disposition Plan: Admit for further evaluation     Review of Systems:  Constitutional:  Negative for diaphoresis.  HENT: Negative for hearing loss, ear pain, nosebleeds, congestion, sore throat, neck pain, tinnitus and ear discharge.   Eyes: Negative for blurred vision, double vision, photophobia, pain, discharge and redness.  Respiratory: Negative for cough, hemoptysis, wheezing and stridor.   Cardiovascular: Negative for chest pain, palpitations, orthopnea, claudication and leg swelling.  Gastrointestinal: Negative for heartburn, constipation, blood in stool and melena.  Genitourinary: Negative for hematuria and flank pain.  Musculoskeletal: Negative for myalgias, back pain, joint pain and falls.  Skin: Negative for itching and rash.  Neurological: Negative for tingling, tremors, focal weakness, loss of consciousness and headaches.  Endo/Heme/Allergies: Negative for environmental allergies and polydipsia. Does not bruise/bleed easily.  Psychiatric/Behavioral: Negative for suicidal ideas. The patient is not nervous/anxious.      Past Medical History  Diagnosis Date  . Hypertension   . Asthma     in childhood  . Shortness of breath   . Arthritis     Past Surgical History  Procedure Laterality Date  .  Ballon angioplasty  03/18/2012  . Knee surgery  x 2   . Back surgery    . Right wrist    . Left ankle    . Appendectomy      Social History:  reports that he has quit smoking. He has never used smokeless tobacco. He reports that he drinks alcohol. He reports that he does not use illicit drugs.  No Known  Allergies  Family History  Problem Relation Age of Onset  . Dementia Mother   . Cancer Father     Medication Sig  albuterol (PROVENTIL HFA;VENTOLIN HFA) 108 (90 BASE) MCG/ACT inhaler Inhale 1-2 puffs into the lungs every 6 (six) hours as needed for wheezing or shortness of breath.  atenolol (TENORMIN) 25 MG tablet Take by mouth 2 (two) times daily.  budesonide-formoterol (SYMBICORT) 160-4.5 MCG/ACT inhaler Inhale 1 puff into the lungs 2 (two) times daily as needed.  fluticasone (FLONASE) 50 MCG/ACT nasal spray Place 1-2 sprays into both nostrils daily as needed for allergies or rhinitis.  amLODipine (NORVASC) 5 MG tablet Take 5 mg by mouth daily.   aspirin EC 81 MG tablet Take 81 mg by mouth daily.  chlorpheniramine-HYDROcodone (TUSSIONEX PENNKINETIC ER) 10-8 MG/5ML LQCR Take 5 mLs by mouth at bedtime as needed for cough.  guaifenesin (MUCUS RELIEF) 400 MG TABS tablet Take 400 mg by mouth every 4 (four) hours.  mirabegron ER (MYRBETRIQ) 25 MG TB24 tablet Take 25 mg by mouth at bedtime.  olmesartan-hydrochlorothiazide (BENICAR HCT) 40-12.5 MG per tablet Take 1 tablet by mouth daily.  omeprazole (PRILOSEC) 40 MG capsule Take 40 mg by mouth daily.  pravastatin (PRAVACHOL) 40 MG tablet Take 40 mg by mouth daily.  timolol (BETIMOL) 0.5 % ophthalmic solution Place 1 drop into both eyes daily.     Physical Exam: Filed Vitals:   03/31/14 1230 03/31/14 1231 03/31/14 1300 03/31/14 1340  BP: 138/66  129/63   Pulse: 60  62   Temp:  98.8 F (37.1 C)  99.3 F (37.4 C)  TempSrc:  Oral  Rectal  Resp: 17  24   Height:      Weight:      SpO2: 94%  94%     Physical Exam  Constitutional: Appears well-developed and well-nourished. No distress.  HENT: Normocephalic. External right and left ear normal. Dry MM Eyes: Conjunctivae and EOM are normal. PERRLA, no scleral icterus.  Neck: Normal ROM. Neck supple. No JVD. No tracheal deviation. No thyromegaly.  CVS: RRR, S1/S2 +, no murmurs, no gallops,  no carotid bruit.  Pulmonary: Effort and breath sounds normal, no stridor, rhonchi, wheezes, rales.  Abdominal: Soft. BS +,  no distension, tenderness, rebound or guarding.  Musculoskeletal: Normal range of motion. No edema and no tenderness.  Lymphadenopathy: No lymphadenopathy noted, cervical, inguinal. Neuro: Alert. Normal reflexes, muscle tone coordination. No cranial nerve deficit. Skin: Skin is warm and dry. No rash noted. Not diaphoretic. No erythema. No pallor.  Psychiatric: Normal mood and affect. Behavior, judgment, thought content normal.   Labs on Admission:  Basic Metabolic Panel:  Recent Labs Lab 03/31/14 1029  NA 135*  K 4.1  CL 96  CO2 22  GLUCOSE 126*  BUN 37*  CREATININE 1.54*  CALCIUM 9.7   CBC:  Recent Labs Lab 03/31/14 1029  WBC 7.3  NEUTROABS 6.1  HGB 12.5*  HCT 34.4*  MCV 94.2  PLT 220   Cardiac Enzymes:  Recent Labs Lab 03/31/14 1029  CKTOTAL 1623*  CKMB 6.8*   EKG:  Normal sinus rhythm, no ST/T wave changes  Theodis Blaze, MD  [facility] Pager [phone]  If 7PM-7AM, please contact night-coverage [email or web address] Password [email or web address] 03/31/2014, 1:53 PM

## 2014-03-31 NOTE — ED Notes (Signed)
Initial Contact - pt resting on stretcher, tremulous.  Family at bs.  Pt c/o mild pain to throat, also c/o generalized weakness.  Pt denies other needs/complaints at this time.  Awake, alert.  RR even/un-lab.  Skin PWD.  MAEI.  Maintained on cardiac/02 monitor at this time.  IVF and IV abx inf a/o.  Awaiting radiology.  NAD.

## 2014-04-01 DIAGNOSIS — I2510 Atherosclerotic heart disease of native coronary artery without angina pectoris: Secondary | ICD-10-CM | POA: Diagnosis not present

## 2014-04-01 DIAGNOSIS — E43 Unspecified severe protein-calorie malnutrition: Secondary | ICD-10-CM | POA: Insufficient documentation

## 2014-04-01 DIAGNOSIS — R05 Cough: Secondary | ICD-10-CM | POA: Diagnosis not present

## 2014-04-01 DIAGNOSIS — M6282 Rhabdomyolysis: Secondary | ICD-10-CM | POA: Diagnosis not present

## 2014-04-01 LAB — CK TOTAL AND CKMB (NOT AT ARMC)
CK TOTAL: 1789 U/L — AB (ref 7–232)
CK, MB: 6.4 ng/mL (ref 0.3–4.0)
CK, MB: 5 ng/mL — ABNORMAL HIGH (ref 0.3–4.0)
RELATIVE INDEX: 0.4 (ref 0.0–2.5)
Relative Index: 0.4 (ref 0.0–2.5)
Total CK: 1117 U/L — ABNORMAL HIGH (ref 7–232)

## 2014-04-01 LAB — CBC
HCT: 31.1 % — ABNORMAL LOW (ref 39.0–52.0)
Hemoglobin: 10.6 g/dL — ABNORMAL LOW (ref 13.0–17.0)
MCH: 33.1 pg (ref 26.0–34.0)
MCHC: 34.1 g/dL (ref 30.0–36.0)
MCV: 97.2 fL (ref 78.0–100.0)
PLATELETS: 188 10*3/uL (ref 150–400)
RBC: 3.2 MIL/uL — AB (ref 4.22–5.81)
RDW: 12.5 % (ref 11.5–15.5)
WBC: 6.3 10*3/uL (ref 4.0–10.5)

## 2014-04-01 LAB — URINE CULTURE

## 2014-04-01 LAB — COMPREHENSIVE METABOLIC PANEL
ALBUMIN: 2.9 g/dL — AB (ref 3.5–5.2)
ALK PHOS: 66 U/L (ref 39–117)
ALT: 21 U/L (ref 0–53)
AST: 65 U/L — ABNORMAL HIGH (ref 0–37)
BILIRUBIN TOTAL: 1 mg/dL (ref 0.3–1.2)
BUN: 30 mg/dL — AB (ref 6–23)
CHLORIDE: 105 meq/L (ref 96–112)
CO2: 19 meq/L (ref 19–32)
CREATININE: 1.46 mg/dL — AB (ref 0.50–1.35)
Calcium: 8.5 mg/dL (ref 8.4–10.5)
GFR calc Af Amer: 49 mL/min — ABNORMAL LOW (ref >90)
GFR, EST NON AFRICAN AMERICAN: 42 mL/min — AB (ref >90)
Glucose, Bld: 88 mg/dL (ref 70–99)
POTASSIUM: 3.5 meq/L — AB (ref 3.7–5.3)
Sodium: 139 mEq/L (ref 137–147)
Total Protein: 5.9 g/dL — ABNORMAL LOW (ref 6.0–8.3)

## 2014-04-01 LAB — GLUCOSE, CAPILLARY: GLUCOSE-CAPILLARY: 86 mg/dL (ref 70–99)

## 2014-04-01 MED ORDER — ENSURE COMPLETE PO LIQD
237.0000 mL | Freq: Three times a day (TID) | ORAL | Status: DC
  Administered 2014-04-01 – 2014-04-05 (×7): 237 mL via ORAL

## 2014-04-01 MED ORDER — SODIUM CHLORIDE 0.9 % IV SOLN
INTRAVENOUS | Status: DC
  Administered 2014-04-01: 100 mL/h via INTRAVENOUS
  Administered 2014-04-02: 05:00:00 via INTRAVENOUS
  Administered 2014-04-03: 75 mL/h via INTRAVENOUS
  Administered 2014-04-03: 02:00:00 via INTRAVENOUS

## 2014-04-01 MED ORDER — TIMOLOL MALEATE 0.5 % OP SOLN
1.0000 [drp] | Freq: Every day | OPHTHALMIC | Status: DC
  Administered 2014-04-01 – 2014-04-05 (×5): 1 [drp] via OPHTHALMIC
  Filled 2014-04-01: qty 5

## 2014-04-01 NOTE — Progress Notes (Signed)
Patient ID: Wayne [name], male   DOB: Feb 16, 1926, 80 y.o.   MRN: [id number]  [facility] PROGRESS NOTE  BANYAN TRACHT [id number] DOB: 03-27-1926 DOA: 03/31/2014 PCP: [name], MD  Brief narrative: 80 year old male with past medical history significant for hypertension and arthritis, presenting to the ED after an episode of fall that occurred one night prior to this admission. He explains he has been seen recently by PCP and was treated in an outpatient setting for dehydration with IVF. He says he was getting fluids in the office nad last night he had to get up and go to the restroom and since it was very dark he fell. He was unable to get up and was down on the floor for more than 12 hours.   In ED, pt noted to be hemodynamically stable, CK elevated consistent with rhabdo and pt admitted for further evaluation.   Assessment and Plan:  Active Problems:  Rhabdomyolysis  - secondary to fall  - continue IVF and repeat Ck today, repeat again in AM - provide supportive care with analgesia as needed  Hypokalemia - mild, will supplement and repeat BMP in AM Acute renal failure  - likely form pre renal etiology, rhabdo  - IVF as noted above and repeat BMP in AM  - advance diet as pt able to tolerate  HTN - blood pressure soft on admission  - will hold antihypertensive regimen that pt is on at home, Atenolol, Benicar, Norvasc  Fever  - unclear etiology but with elevated lactic acid and T max 102 F on admission, agree with continuing empiric ABX Vanc and Zosyn day #2 - urine culture and sputum culture pending  - no infectious etiology noted on CXR, UA unremarkable  - provide tylenol as needed  - narrow down ABX if results of the above tests negative  Acute blood loss anemia - likely dilutional, no signs of active bleeding  - repeat CBC in AM Transaminitis - mild and likely from rhabdo Severe malnutrition - continue feeding supplementation   Radiological Exams on  Admission:   Dg Chest 2 View 03/31/2014 No acute findings.   Dg Lumbar Spine Complete 03/31/2014 Curvature and degenerative changes. No acute or traumatic finding.   Dg Hip Complete Left 03/31/2014 No acute findings. Minimal bilateral hip osteoarthritis.   Ct Head Wo Contrast 03/31/2014 Small rounded area of low attenuation in the peripheral aspect of the right cerebellar hemisphere, possibly representing foliar prominence related to atrophy. Difficult to exclude an infarct. No additional evidence of an acute intracranial abnormality. Atrophy and chronic microvascular white matter ischemic changes. Advanced cervical spondylosis without fracture or subluxation.   Consultants:  None  Antibiotics:  Vancomycin 4/29 -->  Zosyn 4/29 -->  Code Status: Full Family Communication: Pt and wife  at bedside Disposition Plan: PT eval pending   HPI/Subjective: No events overnight.   Objective: Filed Vitals:   04/01/14 0452 04/01/14 0636 04/01/14 1127 04/01/14 1431  BP: 125/60  143/65 110/59  Pulse: 63  52 70  Temp: 98.5 F (36.9 C)   98.1 F (36.7 C)  TempSrc: Oral   Oral  Resp: 20   20  Height:      Weight: 71.7 kg (158 lb 1.1 oz)     SpO2: 99% 95%  99%    Intake/Output Summary (Last 24 hours) at 04/01/14 1451 Last data filed at 04/01/14 1200  Gross per 24 hour  Intake 1498.75 ml  Output   1050 ml  Net 448.75 ml  Exam:   General:  Pt is alert, follows commands appropriately, not in acute distress  Cardiovascular: Regular rate and rhythm, S1/S2, no murmurs, no rubs, no gallops  Respiratory: Clear to auscultation bilaterally, no wheezing, diminished breath sounds at bases   Abdomen: Soft, non tender, non distended, bowel sounds present, no guarding  Extremities: No edema, pulses DP and PT palpable bilaterally  Data Reviewed: Basic Metabolic Panel:  Recent Labs Lab 03/31/14 1029 03/31/14 1643 04/01/14 0420  NA 135* 136* 139  K 4.1 3.9 3.5*  CL 96 101 105  CO2 22  20 19   GLUCOSE 126* 122* 88  BUN 37* 30* 30*  CREATININE 1.54* 1.37* 1.46*  CALCIUM 9.7 8.8 8.5  MG  --  1.6  --   PHOS  --  3.8  --    Liver Function Tests:  Recent Labs Lab 03/31/14 1643 04/01/14 0420  AST 61* 65*  ALT 22 21  ALKPHOS 70 66  BILITOT 0.9 1.0  PROT 6.5 5.9*  ALBUMIN 3.0* 2.9*   CBC:  Recent Labs Lab 03/31/14 1029 03/31/14 1643 04/01/14 0420  WBC 7.3 7.4 6.3  NEUTROABS 6.1 5.7  --   HGB 12.5* 11.8* 10.6*  HCT 34.4* 33.1* 31.1*  MCV 94.2 95.1 97.2  PLT 220 193 188   Cardiac Enzymes:  Recent Labs Lab 03/31/14 1029 04/01/14 0420  CKTOTAL 1623* 1789*  CKMB 6.8* 6.4*   CBG:  Recent Labs Lab 04/01/14 0740  GLUCAP 86   Recent Results (from the past 240 hour(s))  URINE CULTURE     Status: None   Collection Time    03/31/14 10:57 AM      Result Value Ref Range Status   Specimen Description URINE, CLEAN CATCH   Final   Special Requests NONE   Final   Culture  Setup Time     Final   Value: 03/31/2014 14:20     Performed at [facility]     Final   Value: 6,000 COLONIES/ML     Performed at [facility]   Culture     Final   Value: INSIGNIFICANT GROWTH     Performed at [facility]   Report Status 04/01/2014 FINAL   Final  CULTURE, BLOOD (ROUTINE X 2)     Status: None   Collection Time    03/31/14 11:20 AM      Result Value Ref Range Status   Specimen Description BLOOD RIGHT ANTECUBITAL   Final   Special Requests BOTTLES DRAWN AEROBIC AND ANAEROBIC 10ML   Final   Culture  Setup Time     Final   Value: 03/31/2014 14:59     Performed at [facility]   Culture     Final   Value:        BLOOD CULTURE RECEIVED NO GROWTH TO DATE CULTURE WILL BE HELD FOR 5 DAYS BEFORE ISSUING A FINAL NEGATIVE REPORT     Performed at [facility]   Report Status PENDING   Incomplete  CULTURE, BLOOD (ROUTINE X 2)     Status: None   Collection Time    03/31/14 11:20 AM      Result Value Ref Range Status    Specimen Description BLOOD LEFT ANTECUBITAL   Final   Special Requests BOTTLES DRAWN AEROBIC AND ANAEROBIC 5ML   Final   Culture  Setup Time     Final   Value: 03/31/2014 14:59     Performed  at [facility]     Final   Value:        BLOOD CULTURE RECEIVED NO GROWTH TO DATE CULTURE WILL BE HELD FOR 5 DAYS BEFORE ISSUING A FINAL NEGATIVE REPORT     Performed at [facility]   Report Status PENDING   Incomplete     Scheduled Meds: . aspirin EC  81 mg Oral Daily  . budesonide-formoterol  1 puff Inhalation BID  . calcium-vitamin D  1 tablet Oral BID  . guaiFENesin  400 mg Oral 6 times per day  . mirabegron ER  25 mg Oral QHS  . pantoprazole  40 mg Oral Daily  . ZOSYN  IV  3.375 g Intravenous Q8H  . simvastatin  20 mg Oral q1800  . timolol  1 drop Both Eyes Daily  . vancomycin  1,000 mg Intravenous Q24H   Continuous Infusions: . sodium chloride 1,000 mL (03/31/14 1357)  . sodium chloride 75 mL/hr at 04/01/14 0457   Theodis Blaze, MD  [facility] Pager [phone]  If 7PM-7AM, please contact night-coverage [email or web address] Password [email or web address] 04/01/2014, 2:51 PM   LOS: 1 day

## 2014-04-01 NOTE — Progress Notes (Signed)
INITIAL NUTRITION ASSESSMENT  Pt meets criteria for severe MALNUTRITION in the context of chronic illness as evidenced by severe muscle wasting and subcutaneous fat loss in clavicles with <75% estimated energy intake in the past month with 12% weight loss in the past 8-9 months per pt report.  DOCUMENTATION CODES Per approved criteria  -Severe malnutrition in the context of chronic illness   INTERVENTION: - Ensure Complete TID - Encouraged increased meal intake as stomach pain resolves - Discussed importance of nutrition in strength building and overall health and to prevent further unintended weight loss and loss of lean body mass - Will continue to monitor   NUTRITION DIAGNOSIS: Inadequate oral intake related to stomach pain as evidenced by pt and RN report.   Goal: 1. Resolution of stomach pain 2. Pt to consume >90% of meals/supplements  Monitor:  Weights, labs, intake, stomach pain  Reason for Assessment: RN referral for poor intake  80 y.o. male  Admitting Dx: Fall  ASSESSMENT: Pt discussed during multidisciplinary rounds. Pt with past medical history significant for hypertension and arthritis, presenting to the ED after an episode of fall that occurred one night prior to this admission. He was unable to get up and was down on the floor for more than 12 hours. Pt lives alone, son checks on him.   Met with pt who reports prolonged poor appetite with 22 pound unintended weight loss in the past 8-9 months. Eats 2 meals/day of things like fruit and a sandwich. Not on any nutritional supplements at home. C/o stomach pain this morning so he didn't eat very much breakfast. States stomach pain is new for him. Agreeable to getting Ensure Complete during admission.   Potassium low, getting multivitamin  AST elevated   Nutrition Focused Physical Exam:  Subcutaneous Fat:  Orbital Region: WNL Upper Arm Region: moderate muscle wasting and fat loss Thoracic and Lumbar Region:  NA  Muscle:  Temple Region: WNL Clavicle Bone Region: severe muscle wasting and fat loss Clavicle and Acromion Bone Region: severe muscle wasting and fat loss Scapular Bone Region: NA Dorsal Hand: moderate fat loss Patellar Region: WNL Anterior Thigh Region: NA Posterior Calf Region: NA  Edema: None noted      Height: Ht Readings from Last 1 Encounters:  03/31/14 '5\' 7"'  (1.702 m)    Weight: Wt Readings from Last 1 Encounters:  04/01/14 158 lb 1.1 oz (71.7 kg)    Ideal Body Weight: 148 lbs   % Ideal Body Weight: 107%  Wt Readings from Last 10 Encounters:  04/01/14 158 lb 1.1 oz (71.7 kg)  12/18/13 153 lb (69.4 kg)  03/19/12 170 lb 13.7 oz (77.5 kg)  03/19/12 170 lb 13.7 oz (77.5 kg)    Usual Body Weight: 180 lbs per pt  % Usual Body Weight: 88%  BMI:  Body mass index is 24.75 kg/(m^2).  Estimated Nutritional Needs: Kcal: 1800-2000 Protein: 85-100g Fluid: 1.8-2L/day  Skin: Intact   Diet Order: General  EDUCATION NEEDS: -No education needs identified at this time   Intake/Output Summary (Last 24 hours) at 04/01/14 1111 Last data filed at 04/01/14 0518  Gross per 24 hour  Intake 3748.75 ml  Output    650 ml  Net 3098.75 ml    Last BM: 4/29  Labs:   Recent Labs Lab 03/31/14 1029 03/31/14 1643 04/01/14 0420  NA 135* 136* 139  K 4.1 3.9 3.5*  CL 96 101 105  CO2 '22 20 19  ' BUN 37* 30* 30*  CREATININE 1.54*  1.37* 1.46*  CALCIUM 9.7 8.8 8.5  MG  --  1.6  --   PHOS  --  3.8  --   GLUCOSE 126* 122* 88    CBG (last 3)   Recent Labs  04/01/14 0740  GLUCAP 86    Scheduled Meds: . aspirin EC  81 mg Oral Daily  . budesonide-formoterol  1 puff Inhalation BID  . calcium-vitamin D  1 tablet Oral BID  . guaiFENesin  400 mg Oral 6 times per day  . mirabegron ER  25 mg Oral QHS  . multivitamin with minerals  1 tablet Oral Daily  . pantoprazole  40 mg Oral Daily  . piperacillin-tazobactam (ZOSYN)  IV  3.375 g Intravenous Q8H  .  simvastatin  20 mg Oral q1800  . sodium chloride  3 mL Intravenous Q12H  . timolol  1 drop Both Eyes Daily  . vancomycin  1,000 mg Intravenous Q24H  . [START ON 04/05/2014] Vitamin D (Ergocalciferol)  50,000 Units Oral Q7 days    Continuous Infusions: . sodium chloride 1,000 mL (03/31/14 1357)  . sodium chloride 75 mL/hr at 04/01/14 0457    Past Medical History  Diagnosis Date  . Hypertension   . Asthma     in childhood  . Shortness of breath   . Arthritis     Past Surgical History  Procedure Laterality Date  . [place] angioplasty  03/18/2012  . Knee surgery  x 2   . Back surgery    . Right wrist    . Left ankle    . Appendectomy      Mikey College MS, RD, LDN [phone] Pager [phone] After Hours Pager

## 2014-04-01 NOTE — Care Management Note (Signed)
    Page 1 of 1   04/01/2014     11:50:25 AM CARE MANAGEMENT NOTE 04/01/2014  Patient:  Wayne [name], Wayne [name]   Account Number:  [id number]  Date Initiated:  04/01/2014  Documentation initiated by:  Dessa Phi  Subjective/Objective Assessment:   80 Y/O M ADMITTED W/FALL,RHABDOMYLOSIS.HX:HTN,ASTHMA.     Action/Plan:   FROM HOME,ALONE.HAS PCP,PHARMACY.   Anticipated DC Date:  04/02/2014   Anticipated DC Plan:  [place]  CM consult      Choice offered to / List presented to:             Status of service:  In process, will continue to follow Medicare Important Message given?   (If response is "NO", the following Medicare IM given date fields will be blank) Date Medicare IM given:   Date Additional Medicare IM given:    Discharge Disposition:    Per UR Regulation:  Reviewed for med. necessity/level of care/duration of stay  If discussed at [place] of Stay Meetings, dates discussed:    Comments:  04/01/14 Ridhi Hoffert RN,BSN NCM [phone] SEE ED CM NOTE-GIVEN HHC AGENCY LIST.WOULD RECOMMEND PT CONS.

## 2014-04-02 DIAGNOSIS — I2510 Atherosclerotic heart disease of native coronary artery without angina pectoris: Secondary | ICD-10-CM | POA: Diagnosis not present

## 2014-04-02 DIAGNOSIS — M6282 Rhabdomyolysis: Secondary | ICD-10-CM | POA: Diagnosis not present

## 2014-04-02 DIAGNOSIS — W19XXXA Unspecified fall, initial encounter: Secondary | ICD-10-CM | POA: Diagnosis not present

## 2014-04-02 DIAGNOSIS — R05 Cough: Secondary | ICD-10-CM | POA: Diagnosis not present

## 2014-04-02 LAB — CK TOTAL AND CKMB (NOT AT ARMC)
CK TOTAL: 789 U/L — AB (ref 7–232)
CK, MB: 4.3 ng/mL — ABNORMAL HIGH (ref 0.3–4.0)
CK, MB: 3.8 ng/mL (ref 0.3–4.0)
CK, MB: 3.7 ng/mL (ref 0.3–4.0)
CK, MB: 3.4 ng/mL (ref 0.3–4.0)
RELATIVE INDEX: 0.4 (ref 0.0–2.5)
Relative Index: 0.5 (ref 0.0–2.5)
Relative Index: 0.6 (ref 0.0–2.5)
Relative Index: 0.6 (ref 0.0–2.5)
Total CK: 990 U/L — ABNORMAL HIGH (ref 7–232)
Total CK: 646 U/L — ABNORMAL HIGH (ref 7–232)
Total CK: 601 U/L — ABNORMAL HIGH (ref 7–232)

## 2014-04-02 LAB — COMPREHENSIVE METABOLIC PANEL
ALK PHOS: 64 U/L (ref 39–117)
ALT: 21 U/L (ref 0–53)
AST: 52 U/L — AB (ref 0–37)
Albumin: 2.7 g/dL — ABNORMAL LOW (ref 3.5–5.2)
BILIRUBIN TOTAL: 0.7 mg/dL (ref 0.3–1.2)
BUN: 36 mg/dL — ABNORMAL HIGH (ref 6–23)
CHLORIDE: 104 meq/L (ref 96–112)
CO2: 17 mEq/L — ABNORMAL LOW (ref 19–32)
Calcium: 8.5 mg/dL (ref 8.4–10.5)
Creatinine, Ser: 2.5 mg/dL — ABNORMAL HIGH (ref 0.50–1.35)
GFR, EST AFRICAN AMERICAN: 25 mL/min — AB (ref >90)
GFR, EST NON AFRICAN AMERICAN: 22 mL/min — AB (ref >90)
GLUCOSE: 116 mg/dL — AB (ref 70–99)
Potassium: 3.9 mEq/L (ref 3.7–5.3)
SODIUM: 139 meq/L (ref 137–147)
Total Protein: 6 g/dL (ref 6.0–8.3)

## 2014-04-02 LAB — CBC
HCT: 32.5 % — ABNORMAL LOW (ref 39.0–52.0)
Hemoglobin: 11.4 g/dL — ABNORMAL LOW (ref 13.0–17.0)
MCH: 33.4 pg (ref 26.0–34.0)
MCHC: 35.1 g/dL (ref 30.0–36.0)
MCV: 95.3 fL (ref 78.0–100.0)
PLATELETS: 197 10*3/uL (ref 150–400)
RBC: 3.41 MIL/uL — ABNORMAL LOW (ref 4.22–5.81)
RDW: 12.3 % (ref 11.5–15.5)
WBC: 8 10*3/uL (ref 4.0–10.5)

## 2014-04-02 LAB — URINE CULTURE
COLONY COUNT: NO GROWTH
CULTURE: NO GROWTH

## 2014-04-02 LAB — GLUCOSE, CAPILLARY: Glucose-Capillary: 109 mg/dL — ABNORMAL HIGH (ref 70–99)

## 2014-04-02 MED ORDER — VANCOMYCIN HCL IN DEXTROSE 750-5 MG/150ML-% IV SOLN
750.0000 mg | INTRAVENOUS | Status: DC
  Filled 2014-04-02: qty 150

## 2014-04-02 MED ORDER — PIPERACILLIN-TAZOBACTAM IN DEX 2-0.25 GM/50ML IV SOLN
2.2500 g | Freq: Three times a day (TID) | INTRAVENOUS | Status: DC
  Administered 2014-04-02 – 2014-04-03 (×4): 2.25 g via INTRAVENOUS
  Filled 2014-04-02 (×6): qty 50

## 2014-04-02 NOTE — Progress Notes (Signed)
ANTIBIOTIC CONSULT NOTE - FOLLOW UP  Pharmacy Consult for Vancomycin and Zosyn Indication: rule out sepsis  No Known Allergies  Patient Measurements: Height: 5\' 7"  (170.2 cm) Weight: 159 lb 6.3 oz (72.3 kg) IBW/kg (Calculated) : 66.1  Vital Signs: Temp: 99.1 F (37.3 C) (05/01 0500) Temp src: Oral (05/01 0500) BP: 140/63 mmHg (05/01 0500) Pulse Rate: 63 (05/01 0500) Intake/Output from previous day: 04/30 0701 - 05/01 0700 In: 5925.8 [P.O.:200; I.V.:5325.8; IV Piggyback:400] Out: 450 [Urine:450] Intake/Output from this shift: Total I/O In: 100 [P.O.:100] Out: -   Labs:  Recent Labs  03/31/14 1643 04/01/14 0420 04/02/14 0130  WBC 7.4 6.3 8.0  HGB 11.8* 10.6* 11.4*  PLT 193 188 197  CREATININE 1.37* 1.46* 2.50*   Estimated Creatinine Clearance: 20.2 ml/min (by C-G formula based on Cr of 2.5). No results found for this basename: VANCOTROUGH, Corlis Leak, VANCORANDOM, GENTTROUGH, GENTPEAK, GENTRANDOM, TOBRATROUGH, TOBRAPEAK, TOBRARND, AMIKACINPEAK, AMIKACINTROU, AMIKACIN,  in the last 72 hours   Microbiology: Recent Results (from the past 720 hour(s))  URINE CULTURE     Status: None   Collection Time    03/31/14 10:57 AM      Result Value Ref Range Status   Specimen Description URINE, CLEAN CATCH   Final   Special Requests NONE   Final   Culture  Setup Time     Final   Value: 03/31/2014 14:20     Performed at [facility]     Final   Value: 6,000 COLONIES/ML     Performed at [facility]   Culture     Final   Value: INSIGNIFICANT GROWTH     Performed at [facility]   Report Status 04/01/2014 FINAL   Final  CULTURE, BLOOD (ROUTINE X 2)     Status: None   Collection Time    03/31/14 11:20 AM      Result Value Ref Range Status   Specimen Description BLOOD RIGHT ANTECUBITAL   Final   Special Requests BOTTLES DRAWN AEROBIC AND ANAEROBIC 10ML   Final   Culture  Setup Time     Final   Value: 03/31/2014 14:59     Performed  at [facility]   Culture     Final   Value:        BLOOD CULTURE RECEIVED NO GROWTH TO DATE CULTURE WILL BE HELD FOR 5 DAYS BEFORE ISSUING A FINAL NEGATIVE REPORT     Performed at [facility]   Report Status PENDING   Incomplete  CULTURE, BLOOD (ROUTINE X 2)     Status: None   Collection Time    03/31/14 11:20 AM      Result Value Ref Range Status   Specimen Description BLOOD LEFT ANTECUBITAL   Final   Special Requests BOTTLES DRAWN AEROBIC AND ANAEROBIC 5ML   Final   Culture  Setup Time     Final   Value: 03/31/2014 14:59     Performed at [facility]   Culture     Final   Value:        BLOOD CULTURE RECEIVED NO GROWTH TO DATE CULTURE WILL BE HELD FOR 5 DAYS BEFORE ISSUING A FINAL NEGATIVE REPORT     Performed at [facility]   Report Status PENDING   Incomplete  URINE CULTURE     Status: None   Collection Time    03/31/14  8:40 PM      Result Value  Ref Range Status   Specimen Description URINE, CLEAN CATCH   Final   Special Requests NONE   Final   Culture  Setup Time     Final   Value: 04/01/2014 03:32     Performed at [place] Count     Final   Value: NO GROWTH     Performed at [facility]   Culture     Final   Value: NO GROWTH     Performed at [facility]   Report Status 04/02/2014 FINAL   Final    Anti-infectives   Start     Dose/Rate Route Frequency Ordered Stop   04/02/14 1200  vancomycin (VANCOCIN) IVPB 750 mg/150 ml premix     750 mg 150 mL/hr over 60 Minutes Intravenous Every 24 hours 04/02/14 1000     04/01/14 1200  vancomycin (VANCOCIN) IVPB 1000 mg/200 mL premix  Status:  Discontinued     1,000 mg 200 mL/hr over 60 Minutes Intravenous Every 24 hours 03/31/14 1157 04/02/14 1000   03/31/14 2000  piperacillin-tazobactam (ZOSYN) IVPB 3.375 g     3.375 g 12.5 mL/hr over 240 Minutes Intravenous Every 8 hours 03/31/14 1157     03/31/14 1100  piperacillin-tazobactam (ZOSYN) IVPB 3.375 g      3.375 g 100 mL/hr over 30 Minutes Intravenous  Once 03/31/14 1054 03/31/14 1229   03/31/14 1100  vancomycin (VANCOCIN) IVPB 1000 mg/200 mL premix     1,000 mg 200 mL/hr over 60 Minutes Intravenous  Once 03/31/14 1054 03/31/14 1319      Anti-infectives: 4/29 >> Vancomycin >> 4/29 >> Zosyn >>   Assessment: 80 yo presents to ER after fall and dehydration. Was seen by primary Md recently for persistent cough and dehydration and instructed to drink more fluids and then he fell in bathroom and had to lay there 12+ hours. Pharmacy consulted to dose Vancomycin and Zosyn for r/o sepsis.  Day #3 Vanc 1g IV q24h and Zosyn 3.375gm IV q8h (4hr extended infusions)  Tm 99.1  WBC wnl  ARF, rhabdomyolysis, SCr rising 2.5, CrCl down 20 ml/min  Blood cultures pending, urine insignificant growth   Goal of Therapy:  Vancomycin trough level 15-20 mcg/ml Zosyn dose per renal function  Plan:   Decrease Vancomycin to 750mg  IV q24h  Decrease Zosyn to 2.25g IV q8h (30 minute infusions) Follow up renal function & cultures   Peggyann Juba, PharmD, BCPS Pager: [phone] 04/02/2014,10:01 AM

## 2014-04-02 NOTE — Progress Notes (Signed)
Patient ID: Wayne [name], male   DOB: 16-Jun-1926, 80 y.o.   MRN: [id number]  [facility] PROGRESS NOTE  ASEEL LAWS [id number] DOB: Jun 08, 1926 DOA: 03/31/2014 PCP: [name], MD  Brief narrative:  80 year old male with past medical history significant for hypertension and arthritis, presenting to the ED after an episode of fall that occurred one night prior to this admission. He explains he has been seen recently by PCP and was treated in an outpatient setting for dehydration with IVF. He says he was getting fluids in the office nad last night he had to get up and go to the restroom and since it was very dark he fell. He was unable to get up and was down on the floor for more than 12 hours.  In ED, pt noted to be hemodynamically stable, CK elevated consistent with rhabdo and pt admitted for further evaluation.   Assessment and Plan:  Active Problems:  Rhabdomyolysis  - secondary to fall, CK trending down but still elevated   - continue IVF and repeat CK again in AM  - provide supportive care with analgesia as needed  Hypokalemia  - supplemented and WNL this AM Acute renal failure  - likely from pre renal etiology, rhabdo and now worse on Vanc ATN - no urine output recorded since yesterday and pt distended in lower abd quads - bladder scan now, place foley, BMP in AM - IVF as noted above - stop Vancomycin  - advance diet as pt able to tolerate  HTN - blood pressure soft on admission  - will hold antihypertensive regimen that pt is on at home, Atenolol, Benicar, Norvasc  Fever  - unclear etiology but with elevated lactic acid and T max 102 F on admission - pt remains afebrile over 24 hours, no signs of infectious etiology  - urine culture and sputum culture pending but negative to date  - no infectious etiology noted on CXR, UA unremarkable  - provide tylenol as needed  - narrow down ABX Acute blood loss anemia  - likely dilutional, no signs of active  bleeding  - repeat CBC in AM  Transaminitis  - mild and likely from rhabdo  Severe malnutrition  - continue feeding supplementation   Radiological Exams on Admission:  Dg Chest 2 View 03/31/2014 No acute findings.  Dg Lumbar Spine Complete 03/31/2014 Curvature and degenerative changes. No acute or traumatic finding.  Dg Hip Complete Left 03/31/2014 No acute findings. Minimal bilateral hip osteoarthritis.  Ct Head Wo Contrast 03/31/2014 Small rounded area of low attenuation in the peripheral aspect of the right cerebellar hemisphere, possibly representing foliar prominence related to atrophy. Difficult to exclude an infarct. No additional evidence of an acute intracranial abnormality. Atrophy and chronic microvascular white matter ischemic changes. Advanced cervical spondylosis without fracture or subluxation.  Consultants:  None Antibiotics:  Vancomycin 4/29 --> 5/1 Zosyn 4/29 -->  Code Status: Full  Family Communication: Pt and wife at bedside  Disposition Plan: SNF once medically ready    HPI/Subjective: No events overnight.   Objective: Filed Vitals:   04/01/14 2055 04/01/14 2130 04/02/14 0500 04/02/14 0900  BP: 110/54 127/70 140/63   Pulse: 62 97 63   Temp: 98.3 F (36.8 C) 98.5 F (36.9 C) 99.1 F (37.3 C)   TempSrc: Oral Oral Oral   Resp: 19 18 16    Height:      Weight:   72.3 kg (159 lb 6.3 oz)   SpO2: 97% 100% 97% 99%  Intake/Output Summary (Last 24 hours) at 04/02/14 1226 Last data filed at 04/02/14 0827  Gross per 24 hour  Intake 5725.83 ml  Output      0 ml  Net 5725.83 ml    Exam:   General:  Pt is alert, follows commands appropriately, not in acute distress  Cardiovascular: Regular rate and rhythm,  no rubs, no gallops  Respiratory: Clear to auscultation bilaterally, no wheezing, diminished breath sounds at bases   Abdomen: Soft, non tender, distended in lower abd quadrants, bowel sounds present, no guarding  Extremities: No edema, pulses DP and  PT palpable bilaterally  Data Reviewed: Basic Metabolic Panel:  Recent Labs Lab 03/31/14 1029 03/31/14 1643 04/01/14 0420 04/02/14 0130  NA 135* 136* 139 139  K 4.1 3.9 3.5* 3.9  CL 96 101 105 104  CO2 22 20 19  17*  GLUCOSE 126* 122* 88 116*  BUN 37* 30* 30* 36*  CREATININE 1.54* 1.37* 1.46* 2.50*  CALCIUM 9.7 8.8 8.5 8.5  MG  --  1.6  --   --   PHOS  --  3.8  --   --    Liver Function Tests:  Recent Labs Lab 03/31/14 1643 04/01/14 0420 04/02/14 0130  AST 61* 65* 52*  ALT 22 21 21   ALKPHOS 70 66 64  BILITOT 0.9 1.0 0.7  PROT 6.5 5.9* 6.0  ALBUMIN 3.0* 2.9* 2.7*   CBC:  Recent Labs Lab 03/31/14 1029 03/31/14 1643 04/01/14 0420 04/02/14 0130  WBC 7.3 7.4 6.3 8.0  NEUTROABS 6.1 5.7  --   --   HGB 12.5* 11.8* 10.6* 11.4*  HCT 34.4* 33.1* 31.1* 32.5*  MCV 94.2 95.1 97.2 95.3  PLT 220 193 188 197   Cardiac Enzymes:  Recent Labs Lab 03/31/14 1029 04/01/14 0420 04/01/14 2040 04/02/14 0130 04/02/14 0723  CKTOTAL 1623* 1789* 1117* 990* 789*  CKMB 6.8* 6.4* 5.0* 4.3* 3.8     Recent Labs Lab 04/01/14 0740 04/02/14 0748  GLUCAP 86 109*    Recent Results (from the past 240 hour(s))  URINE CULTURE     Status: None   Collection Time    03/31/14 10:57 AM      Result Value Ref Range Status   Specimen Description URINE, CLEAN CATCH   Final   Special Requests NONE   Final   Culture  Setup Time     Final   Value: 03/31/2014 14:20     Performed at [facility]     Final   Value: 6,000 COLONIES/ML     Performed at [facility]   Culture     Final   Value: INSIGNIFICANT GROWTH     Performed at [facility]   Report Status 04/01/2014 FINAL   Final  CULTURE, BLOOD (ROUTINE X 2)     Status: None   Collection Time    03/31/14 11:20 AM      Result Value Ref Range Status   Specimen Description BLOOD RIGHT ANTECUBITAL   Final   Special Requests BOTTLES DRAWN AEROBIC AND ANAEROBIC 10ML   Final   Culture  Setup Time      Final   Value: 03/31/2014 14:59     Performed at [facility]   Culture     Final   Value:        BLOOD CULTURE RECEIVED NO GROWTH TO DATE CULTURE WILL BE HELD FOR 5 DAYS BEFORE ISSUING A FINAL NEGATIVE REPORT  Performed at [facility]   Report Status PENDING   Incomplete  CULTURE, BLOOD (ROUTINE X 2)     Status: None   Collection Time    03/31/14 11:20 AM      Result Value Ref Range Status   Specimen Description BLOOD LEFT ANTECUBITAL   Final   Special Requests BOTTLES DRAWN AEROBIC AND ANAEROBIC 5ML   Final   Culture  Setup Time     Final   Value: 03/31/2014 14:59     Performed at [facility]   Culture     Final   Value:        BLOOD CULTURE RECEIVED NO GROWTH TO DATE CULTURE WILL BE HELD FOR 5 DAYS BEFORE ISSUING A FINAL NEGATIVE REPORT     Performed at [facility]   Report Status PENDING   Incomplete  URINE CULTURE     Status: None   Collection Time    03/31/14  8:40 PM      Result Value Ref Range Status   Specimen Description URINE, CLEAN CATCH   Final   Special Requests NONE   Final   Culture  Setup Time     Final   Value: 04/01/2014 03:32     Performed at [facility]     Final   Value: NO GROWTH     Performed at [facility]   Culture     Final   Value: NO GROWTH     Performed at [facility]   Report Status 04/02/2014 FINAL   Final     Scheduled Meds: . aspirin EC  81 mg Oral Daily  . budesonide-formoterol  1 puff Inhalation BID  . calcium-vitamin D  1 tablet Oral BID  . feeding supplement (ENSURE COMPLETE)  237 mL Oral TID BM  . guaiFENesin  400 mg Oral 6 times per day  . mirabegron ER  25 mg Oral QHS  . multivitamin with minerals  1 tablet Oral Daily  . pantoprazole  40 mg Oral Daily  . piperacillin-tazobactam (ZOSYN)  IV  2.25 g Intravenous Q8H  . sodium chloride  3 mL Intravenous Q12H  . timolol  1 drop Both Eyes Daily  . vancomycin  750 mg Intravenous Q24H  . [START ON  04/05/2014] Vitamin D (Ergocalciferol)  50,000 Units Oral Q7 days   Continuous Infusions: . sodium chloride 1,000 mL (03/31/14 1357)  . sodium chloride 100 mL/hr at 04/02/14 0503     Theodis Blaze, MD  [facility] Pager [phone]  If 7PM-7AM, please contact night-coverage [email or web address] Password [email or web address] 04/02/2014, 12:26 PM   LOS: 2 days

## 2014-04-02 NOTE — Evaluation (Signed)
Physical Therapy Evaluation Patient Details Name: Wayne [name] MRN: [id number] DOB: 1926-07-06 Today's Date: 04/02/2014   History of Present Illness  Pt is an 80 year old male with Hx of HTN and arthitis, admitted 4/29 with dehydration and rhabdomyolysis secondary to fall at home.  Clinical Impression  Pt currently with functional limitations due to the deficits listed below (see PT Problem List).  Pt will benefit from skilled PT to increase their independence and safety with mobility to allow discharge to the venue listed below.  Pt very shaky this morning and required min assist for mobility, ambulated with RW (typically uses no assistive device).  Pt would like to d/c home pending improvement but agreeable to ST-SNF since he lives alone and currently is not at baseline.     Follow Up Recommendations SNF    Equipment Recommendations  Rolling walker with 5" wheels    Recommendations for Other Services       Precautions / Restrictions Precautions Precautions: Fall      Mobility  Bed Mobility Overal bed mobility: Needs Assistance Bed Mobility: Supine to Sit;Sit to Supine     Supine to sit: Supervision Sit to supine: Supervision      Transfers Overall transfer level: Needs assistance Equipment used: Rolling walker (2 wheeled) Transfers: Sit to/from Stand Sit to Stand: Min assist;From elevated surface         General transfer comment: verbal cues for safe technique using RW, very shaky extremities at rest (pt reports this started just prior to admission)  Ambulation/Gait Ambulation/Gait assistance: Min assist Ambulation Distance (Feet): 80 Feet Assistive device: Rolling walker (2 wheeled) Gait Pattern/deviations: Step-through pattern;Trunk flexed;Narrow base of support Gait velocity: decr   General Gait Details: pt reports increased weakness, very unsteady, required use of RW for UE support  Stairs            Wheelchair Mobility    Modified Rankin  (Stroke Patients Only)       Balance                                             Pertinent Vitals/Pain No pain reported    Home Living Family/patient expects to be discharged to:: Private residence Living Arrangements: Alone Available Help at Discharge: Available PRN/intermittently (son checks in) Type of Home: House Home Access: Level entry     Home Layout: One level Home Equipment: Cane - single point      Prior Function Level of Independence: Independent with assistive device(s)         Comments: [facility], states using a SPC just prior to admission however no assistive device normally     Hand Dominance        Extremity/Trunk Assessment               Lower Extremity Assessment: Generalized weakness         Communication   Communication: No difficulties  Cognition Arousal/Alertness: Awake/alert Behavior During Therapy: WFL for tasks assessed/performed Overall Cognitive Status: Within Functional Limits for tasks assessed                      General Comments      Exercises        Assessment/Plan    PT Assessment Patient needs continued PT services  PT Diagnosis Difficulty walking;Generalized weakness  PT Problem List Decreased strength;Decreased activity tolerance;Decreased mobility;Decreased balance;Decreased knowledge of use of DME  PT Treatment Interventions DME instruction;Gait training;Functional mobility training;Therapeutic activities;Therapeutic exercise;Patient/family education   PT Goals (Current goals can be found in the Care Plan section) Acute Rehab PT Goals PT Goal Formulation: With patient Time For Goal Achievement: 04/09/14 Potential to Achieve Goals: Good    Frequency Min 3X/week   Barriers to discharge        Co-evaluation               End of Session Equipment Utilized During Treatment: Gait belt Activity Tolerance: Patient limited by  fatigue Patient left: in bed;with call bell/phone within reach;with bed alarm set Nurse Communication: Mobility status         Time: 0912-0927 PT Time Calculation (min): 15 min   Charges:   PT Evaluation $Initial PT Evaluation Tier I: 1 Procedure PT Treatments $Gait Training: 8-22 mins   PT G CodesJunius Argyle 04/02/2014, 12:13 PM Carmelia Bake, PT, DPT 04/02/2014 Pager: [phone]

## 2014-04-02 NOTE — Progress Notes (Signed)
Clinical Social Work Department CLINICAL SOCIAL WORK PLACEMENT NOTE 04/02/2014  Patient:  Wayne [name], Wayne [name]  Account Number:  [id number] Admit date:  03/31/2014  Clinical Social Worker:  Renold Genta  Date/time:  04/02/2014 03:31 PM  Clinical Social Work is seeking post-discharge placement for this patient at the following level of care:   SKILLED NURSING   (*CSW will update this form in Epic as items are completed)   04/02/2014  Patient/family provided with [place] Department of Clinical Social Work's list of facilities offering this level of care within the geographic area requested by the patient (or if unable, by the patient's family).  04/02/2014  Patient/family informed of their freedom to choose among providers that offer the needed level of care, that participate in Medicare, Medicaid or managed care program needed by the patient, have an available bed and are willing to accept the patient.  04/02/2014  Patient/family informed of [facility]' ownership interest in [facility], as well as of the fact that they are under no obligation to receive care at this facility.  PASARR submitted to EDS on 04/02/2014 PASARR number received from EDS on 04/02/2014  FL2 transmitted to all facilities in geographic area requested by pt/family on  04/02/2014 FL2 transmitted to all facilities within larger geographic area on   Patient informed that his/her managed care company has contracts with or will negotiate with  certain facilities, including the following:     Patient/family informed of bed offers received:  04/02/2014 Patient chooses bed at [place] Physician recommends and patient chooses bed at    Patient to be transferred to [place] on   Patient to be transferred to facility by   The following physician request were entered in Epic:   Additional Comments:   Raynaldo Opitz, [place] Worker cell #:  [phone]

## 2014-04-02 NOTE — Progress Notes (Signed)
Clinical Social Work Department BRIEF PSYCHOSOCIAL ASSESSMENT 04/02/2014  Patient:  Wayne [name], Wayne [name]     Account Number:  [id number]     Admit date:  03/31/2014  Clinical Social Worker:  Renold Genta  Date/Time:  04/02/2014 03:26 PM  Referred by:  Physician  Date Referred:  04/02/2014 Referred for  SNF Placement   Other Referral:   Interview type:  Patient Other interview type:    PSYCHOSOCIAL DATA Living Status:  ALONE Admitted from facility:   Level of care:   Primary support name:  Annan Lemarr (son) c#: [phone] Primary support relationship to patient:  CHILD, ADULT Degree of support available:   good    CURRENT CONCERNS Current Concerns  Post-Acute Placement   Other Concerns:    SOCIAL WORK ASSESSMENT / PLAN CSW received consult from Dr. Doyle Askew that patient may need SNF at discharge.   Assessment/plan status:  Information/Referral to [employer] Other assessment/ plan:   Information/referral to community resources:   CSW completed FL2 and faxed information out to [facility] & provided bed offers to patient.    PATIENT'S/FAMILY'S RESPONSE TO PLAN OF CARE: Patient is agreeable with plan for SNF - states that he's been to [facility] in the past but has visited friends at [facility] and would prefer to go there. CSW confirmed with [facility] that they would be able to take him Monday.       Raynaldo Opitz, [facility] Clinical Social Worker cell #: [phone]

## 2014-04-03 ENCOUNTER — Inpatient Hospital Stay (HOSPITAL_COMMUNITY): Payer: Medicare Other

## 2014-04-03 DIAGNOSIS — R509 Fever, unspecified: Secondary | ICD-10-CM | POA: Diagnosis not present

## 2014-04-03 DIAGNOSIS — W19XXXA Unspecified fall, initial encounter: Secondary | ICD-10-CM

## 2014-04-03 DIAGNOSIS — Y92009 Unspecified place in unspecified non-institutional (private) residence as the place of occurrence of the external cause: Secondary | ICD-10-CM

## 2014-04-03 DIAGNOSIS — N179 Acute kidney failure, unspecified: Secondary | ICD-10-CM | POA: Diagnosis not present

## 2014-04-03 DIAGNOSIS — M6282 Rhabdomyolysis: Secondary | ICD-10-CM | POA: Diagnosis not present

## 2014-04-03 LAB — CK TOTAL AND CKMB (NOT AT ARMC)
CK TOTAL: 504 U/L — AB (ref 7–232)
CK, MB: 3 ng/mL (ref 0.3–4.0)
CK, MB: 3 ng/mL (ref 0.3–4.0)
CK, MB: 3.6 ng/mL (ref 0.3–4.0)
Relative Index: 0.6 (ref 0.0–2.5)
Relative Index: 0.7 (ref 0.0–2.5)
Relative Index: 0.8 (ref 0.0–2.5)
Total CK: 458 U/L — ABNORMAL HIGH (ref 7–232)
Total CK: 474 U/L — ABNORMAL HIGH (ref 7–232)

## 2014-04-03 LAB — BASIC METABOLIC PANEL
BUN: 39 mg/dL — ABNORMAL HIGH (ref 6–23)
CALCIUM: 8.1 mg/dL — AB (ref 8.4–10.5)
CO2: 20 mEq/L (ref 19–32)
Chloride: 110 mEq/L (ref 96–112)
Creatinine, Ser: 2.47 mg/dL — ABNORMAL HIGH (ref 0.50–1.35)
GFR calc Af Amer: 26 mL/min — ABNORMAL LOW (ref >90)
GFR calc non Af Amer: 22 mL/min — ABNORMAL LOW (ref >90)
Glucose, Bld: 126 mg/dL — ABNORMAL HIGH (ref 70–99)
Potassium: 4 mEq/L (ref 3.7–5.3)
SODIUM: 143 meq/L (ref 137–147)

## 2014-04-03 LAB — CBC
HCT: 30.7 % — ABNORMAL LOW (ref 39.0–52.0)
Hemoglobin: 10.6 g/dL — ABNORMAL LOW (ref 13.0–17.0)
MCH: 33.2 pg (ref 26.0–34.0)
MCHC: 34.5 g/dL (ref 30.0–36.0)
MCV: 96.2 fL (ref 78.0–100.0)
PLATELETS: 181 10*3/uL (ref 150–400)
RBC: 3.19 MIL/uL — AB (ref 4.22–5.81)
RDW: 12.5 % (ref 11.5–15.5)
WBC: 7.7 10*3/uL (ref 4.0–10.5)

## 2014-04-03 LAB — SODIUM, URINE, RANDOM: Sodium, Ur: 105 mEq/L

## 2014-04-03 LAB — CREATININE, URINE, RANDOM: CREATININE, URINE: 117 mg/dL

## 2014-04-03 LAB — GLUCOSE, CAPILLARY: Glucose-Capillary: 96 mg/dL (ref 70–99)

## 2014-04-03 NOTE — Progress Notes (Signed)
Patient ID: CHARLEE ARNELL, male   DOB: November 27, 1926, 80 y.o.   MRN: [id number]  [facility] PROGRESS NOTE  SHAQUELL HEHN [id number] DOB: [birth year] DOA: 03/31/2014 PCP: [name], MD  Brief narrative:  80 year old male with past medical history significant for hypertension and arthritis, presenting to the ED after an episode of fall that occurred one night prior to this admission. He explains he has been seen recently by PCP and was treated in an outpatient setting for dehydration with IVF. He says he was getting fluids in the office nad last night he had to get up and go to the restroom and since it was very dark he fell. He was unable to get up and was down on the floor for more than 12 hours.  In ED, pt noted to be hemodynamically stable, CK elevated consistent with rhabdo and pt admitted for further evaluation.   Assessment and Plan:  Active Problems:  Rhabdomyolysis  - secondary to fall, CK trending down but still elevated   - continue IVF and repeat CK again in AM  - provide supportive care with analgesia as needed  Hypokalemia  - supplemented and WNL this AM Acute renal failure  - likely from pre renal etiology, rhabdo and now worse on Vanc ATN - no urine output recorded since yesterday and pt distended in lower abd quads - bladder scan now, place foley, BMP in AM shows slight improvement - IVF as noted above - stopped Vancomycin  - advance diet as pt able to tolerate  HTN - blood pressure soft on admission  - will hold antihypertensive regimen that pt is on at home, Atenolol, Benicar, Norvasc  Fever  - unclear etiology but with elevated lactic acid and T max 102 F on admission - pt remains afebrile over 24 hours, no signs of infectious etiology  - urine culture and sputum culture pending but negative to date  - no infectious etiology noted on CXR, UA unremarkable  - provide tylenol as needed  - cultures have been negative for 3 days so far. We will stop  the zosyn today.  Acute blood loss anemia  - likely dilutional, no signs of active bleeding  - repeat CBC in AM is 10.5 . Continue to monitor.  Transaminitis  - mild and likely from rhabdo , improving.  Severe malnutrition  - continue feeding supplementation   Radiological Exams on Admission:  Dg Chest 2 View 03/31/2014 No acute findings.  Dg Lumbar Spine Complete 03/31/2014 Curvature and degenerative changes. No acute or traumatic finding.  Dg Hip Complete Left 03/31/2014 No acute findings. Minimal bilateral hip osteoarthritis.  Ct Head Wo Contrast 03/31/2014 Small rounded area of low attenuation in the peripheral aspect of the right cerebellar hemisphere, possibly representing foliar prominence related to atrophy. Difficult to exclude an infarct. No additional evidence of an acute intracranial abnormality. Atrophy and chronic microvascular white matter ischemic changes. Advanced cervical spondylosis without fracture or subluxation.  Consultants:  None Antibiotics:  Vancomycin 4/29 --> 5/1 Zosyn 4/29 -->  Code Status: Full  Family Communication: none atbedside.   Disposition Plan: SNF once medically ready    HPI/Subjective: No events overnight.   Objective: Filed Vitals:   04/02/14 2200 04/03/14 0500 04/03/14 0827 04/03/14 1500  BP: 120/62 154/69  120/64  Pulse: 65 60  60  Temp: 98.6 F (37 C) 98.2 F (36.8 C)  97.8 F (36.6 C)  TempSrc: Oral Oral  Oral  Resp: 18 16  18   Height:  Weight:  76.2 kg (167 lb 15.9 oz)    SpO2: 98% 100% 96% 100%    Intake/Output Summary (Last 24 hours) at 04/03/14 1635 Last data filed at 04/03/14 1531  Gross per 24 hour  Intake   2680 ml  Output   2675 ml  Net      5 ml    Exam:   General:  Pt is alert, follows commands appropriately, not in acute distress  Cardiovascular: Regular rate and rhythm,  no rubs, no gallops  Respiratory: Clear to auscultation bilaterally, no wheezing, diminished breath sounds at bases   Abdomen:  Soft, non tender, distended in lower abd quadrants, bowel sounds present, no guarding  Extremities: No edema, pulses DP and PT palpable bilaterally  Data Reviewed: Basic Metabolic Panel:  Recent Labs Lab 03/31/14 1029 03/31/14 1643 04/01/14 0420 04/02/14 0130 04/03/14 0500  NA 135* 136* 139 139 143  K 4.1 3.9 3.5* 3.9 4.0  CL 96 101 105 104 110  CO2 22 20 19  17* 20  GLUCOSE 126* 122* 88 116* 126*  BUN 37* 30* 30* 36* 39*  CREATININE 1.54* 1.37* 1.46* 2.50* 2.47*  CALCIUM 9.7 8.8 8.5 8.5 8.1*  MG  --  1.6  --   --   --   PHOS  --  3.8  --   --   --    Liver Function Tests:  Recent Labs Lab 03/31/14 1643 04/01/14 0420 04/02/14 0130  AST 61* 65* 52*  ALT 22 21 21   ALKPHOS 70 66 64  BILITOT 0.9 1.0 0.7  PROT 6.5 5.9* 6.0  ALBUMIN 3.0* 2.9* 2.7*   CBC:  Recent Labs Lab 03/31/14 1029 03/31/14 1643 04/01/14 0420 04/02/14 0130 04/03/14 0500  WBC 7.3 7.4 6.3 8.0 7.7  NEUTROABS 6.1 5.7  --   --   --   HGB 12.5* 11.8* 10.6* 11.4* 10.6*  HCT 34.4* 33.1* 31.1* 32.5* 30.7*  MCV 94.2 95.1 97.2 95.3 96.2  PLT 220 193 188 197 181   Cardiac Enzymes:  Recent Labs Lab 04/02/14 1411 04/02/14 1949 04/03/14 0132 04/03/14 0735 04/03/14 1355  CKTOTAL 646* 601* 504* 458* 474*  CKMB 3.7 3.4 3.0 3.0 3.6     Recent Labs Lab 04/01/14 0740 04/02/14 0748 04/03/14 0715  GLUCAP 86 109* 96    Recent Results (from the past 240 hour(s))  URINE CULTURE     Status: None   Collection Time    03/31/14 10:57 AM      Result Value Ref Range Status   Specimen Description URINE, CLEAN CATCH   Final   Special Requests NONE   Final   Culture  Setup Time     Final   Value: 03/31/2014 14:20     Performed at [facility]     Final   Value: 6,000 COLONIES/ML     Performed at [facility]     Final   Value: INSIGNIFICANT GROWTH     Performed at [facility]   Report Status 04/01/2014 FINAL   Final  CULTURE, BLOOD (ROUTINE X 2)      Status: None   Collection Time    03/31/14 11:20 AM      Result Value Ref Range Status   Specimen Description BLOOD RIGHT ANTECUBITAL   Final   Special Requests BOTTLES DRAWN AEROBIC AND ANAEROBIC 10ML   Final   Culture  Setup Time     Final   Value:  03/31/2014 14:59     Performed at [facility]   Culture     Final   Value:        BLOOD CULTURE RECEIVED NO GROWTH TO DATE CULTURE WILL BE HELD FOR 5 DAYS BEFORE ISSUING A FINAL NEGATIVE REPORT     Performed at [facility]   Report Status PENDING   Incomplete  CULTURE, BLOOD (ROUTINE X 2)     Status: None   Collection Time    03/31/14 11:20 AM      Result Value Ref Range Status   Specimen Description BLOOD LEFT ANTECUBITAL   Final   Special Requests BOTTLES DRAWN AEROBIC AND ANAEROBIC 5ML   Final   Culture  Setup Time     Final   Value: 03/31/2014 14:59     Performed at [facility]   Culture     Final   Value:        BLOOD CULTURE RECEIVED NO GROWTH TO DATE CULTURE WILL BE HELD FOR 5 DAYS BEFORE ISSUING A FINAL NEGATIVE REPORT     Performed at [facility]   Report Status PENDING   Incomplete  URINE CULTURE     Status: None   Collection Time    03/31/14  8:40 PM      Result Value Ref Range Status   Specimen Description URINE, CLEAN CATCH   Final   Special Requests NONE   Final   Culture  Setup Time     Final   Value: 04/01/2014 03:32     Performed at [facility]     Final   Value: NO GROWTH     Performed at [facility]   Culture     Final   Value: NO GROWTH     Performed at [facility]   Report Status 04/02/2014 FINAL   Final     Scheduled Meds: . aspirin EC  81 mg Oral Daily  . budesonide-formoterol  1 puff Inhalation BID  . calcium-vitamin D  1 tablet Oral BID  . feeding supplement (ENSURE COMPLETE)  237 mL Oral TID BM  . guaiFENesin  400 mg Oral 6 times per day  . mirabegron ER  25 mg Oral QHS  . multivitamin with minerals  1 tablet Oral  Daily  . pantoprazole  40 mg Oral Daily  . piperacillin-tazobactam (ZOSYN)  IV  2.25 g Intravenous Q8H  . sodium chloride  3 mL Intravenous Q12H  . timolol  1 drop Both Eyes Daily  . [START ON 04/05/2014] Vitamin D (Ergocalciferol)  50,000 Units Oral Q7 days   Continuous Infusions: . sodium chloride 75 mL/hr (04/03/14 1241)     Hosie Poisson, MD  [facility] Pager [phone]  If 7PM-7AM, please contact night-coverage [email or web address] Password [email or web address] 04/03/2014, 4:35 PM   LOS: 3 days

## 2014-04-04 DIAGNOSIS — M6282 Rhabdomyolysis: Secondary | ICD-10-CM | POA: Diagnosis not present

## 2014-04-04 DIAGNOSIS — R05 Cough: Secondary | ICD-10-CM | POA: Diagnosis not present

## 2014-04-04 DIAGNOSIS — I2510 Atherosclerotic heart disease of native coronary artery without angina pectoris: Secondary | ICD-10-CM | POA: Diagnosis not present

## 2014-04-04 DIAGNOSIS — W19XXXA Unspecified fall, initial encounter: Secondary | ICD-10-CM | POA: Diagnosis not present

## 2014-04-04 LAB — GLUCOSE, CAPILLARY: Glucose-Capillary: 88 mg/dL (ref 70–99)

## 2014-04-04 LAB — BASIC METABOLIC PANEL
BUN: 26 mg/dL — ABNORMAL HIGH (ref 6–23)
CHLORIDE: 109 meq/L (ref 96–112)
CO2: 21 meq/L (ref 19–32)
Calcium: 8.7 mg/dL (ref 8.4–10.5)
Creatinine, Ser: 1.23 mg/dL (ref 0.50–1.35)
GFR calc Af Amer: 60 mL/min — ABNORMAL LOW (ref >90)
GFR calc non Af Amer: 52 mL/min — ABNORMAL LOW (ref >90)
Glucose, Bld: 89 mg/dL (ref 70–99)
Potassium: 3.9 mEq/L (ref 3.7–5.3)
Sodium: 143 mEq/L (ref 137–147)

## 2014-04-04 MED ORDER — AMLODIPINE BESYLATE 10 MG PO TABS
10.0000 mg | ORAL_TABLET | Freq: Every day | ORAL | Status: DC
  Administered 2014-04-04 – 2014-04-05 (×2): 10 mg via ORAL
  Filled 2014-04-04 (×2): qty 1

## 2014-04-04 NOTE — Progress Notes (Signed)
Asked by RN to meet with Pt and son at bedside to discuss d/c plans.  CSW met with Pt and son and reviewed d/c plan, which consists of d/c to [facility], possibly tomorrow.  Pt and son in agreement with plan.  Pt's son would like to be contacted when Pt ready for d/c.  Weekday CSW to follow.  Bernita Raisin, [place] Work [phone]

## 2014-04-04 NOTE — Progress Notes (Signed)
Patient ID: Wayne [name], male   DOB: 1926/08/31, 80 y.o.   MRN: [id number]  [facility] PROGRESS NOTE  JAYDENCE VANSON [id number] DOB: 05-20-1926 DOA: 03/31/2014 PCP: [name], MD  Brief narrative:  80 year old male with past medical history significant for hypertension and arthritis, presenting to the ED after an episode of fall that occurred one night prior to this admission. He explains he has been seen recently by PCP and was treated in an outpatient setting for dehydration with IVF. He says he was getting fluids in the office nad last night he had to get up and go to the restroom and since it was very dark he fell. He was unable to get up and was down on the floor for more than 12 hours.   In ED, pt noted to be hemodynamically stable, CK elevated consistent with rhabdo and pt admitted for further evaluation.   Assessment and Plan:  Active Problems:  Rhabdomyolysis  - secondary to fall, CK trending down but still elevated  - continue IVF and repeat CK again in AM  - pt clinically stable, sitting in chair this AM, feels better - provide supportive care with analgesia as needed  Hypokalemia  - supplemented and WNL this AM  Acute renal failure  - likely from pre renal etiology, rhabdo and worse on Vanc ATN  - IVF and Cr continued to trend down, now WNL this AM - IVF as noted above  - stopped Vancomycin  - advance diet as pt able to tolerate  HTN - blood pressure soft on admission  - will hold antihypertensive regimen that pt is on at home, Atenolol, Benicar - resume Norvasc per home medical regimen today  Fever  - unclear etiology but with elevated lactic acid and T max 102 F on admission  - pt remains afebrile over 48 hours, no signs of infectious etiology  - urine culture and sputum culture pending but negative to date  - no infectious etiology noted on CXR, UA unremarkable  - provide tylenol as needed  - cultures have been negative for 3 days so far.  Zosyn discontinued 04/03/2014  Acute blood loss anemia  - likely dilutional, no signs of active bleeding  - repeat CBC in AM is 10.6 . Continue to monitor.  Transaminitis  - mild and likely from rhabdo , improving.  Severe malnutrition  - continue feeding supplementation  - pt tolerating current diet well   Radiological Exams on Admission:  Dg Chest 2 View 03/31/2014 No acute findings.  Dg Lumbar Spine Complete 03/31/2014 Curvature and degenerative changes. No acute or traumatic finding.  Dg Hip Complete Left 03/31/2014 No acute findings. Minimal bilateral hip osteoarthritis.  Ct Head Wo Contrast 03/31/2014 Small rounded area of low attenuation in the peripheral aspect of the right cerebellar hemisphere, possibly representing foliar prominence related to atrophy. Difficult to exclude an infarct. No additional evidence of an acute intracranial abnormality. Atrophy and chronic microvascular white matter ischemic changes. Advanced cervical spondylosis without fracture or subluxation.  Consultants:  None Antibiotics:  Vancomycin 4/29 --> 5/1  Zosyn 4/29 --> 5/2  Code Status: Full  Family Communication: none at bedside. Son over the phone  Disposition Plan: SNF once medically ready, likely in AM   HPI/Subjective: No events overnight.   Objective: Filed Vitals:   04/03/14 2127 04/04/14 0500 04/04/14 0630 04/04/14 0807  BP: 172/78  157/75   Pulse: 52  54   Temp: 98.4 F (36.9 C)  98.4 F (36.9 C)  TempSrc: Oral  Oral   Resp: 20  20   Height:      Weight:  74.4 kg (164 lb 0.4 oz)    SpO2: 100%  97% 97%    Intake/Output Summary (Last 24 hours) at 04/04/14 1119 Last data filed at 04/04/14 0900  Gross per 24 hour  Intake 2948.75 ml  Output   1878 ml  Net 1070.75 ml    Exam:   General:  Pt is alert, follows commands appropriately, not in acute distress  Cardiovascular: Regular rate and rhythm, S1/S2, no murmurs, no rubs, no gallops  Respiratory: Clear to auscultation  bilaterally, no wheezing, no crackles, no rhonchi  Abdomen: Soft, non tender, non distended, bowel sounds present, no guarding  Extremities: No edema, pulses DP and PT palpable bilaterally  Data Reviewed: Basic Metabolic Panel:  Recent Labs Lab 03/31/14 1029 03/31/14 1643 04/01/14 0420 04/02/14 0130 04/03/14 0500 04/04/14 0526  NA 135* 136* 139 139 143 143  K 4.1 3.9 3.5* 3.9 4.0 3.9  CL 96 101 105 104 110 109  CO2 22 20 19  17* 20 21  GLUCOSE 126* 122* 88 116* 126* 89  BUN 37* 30* 30* 36* 39* 26*  CREATININE 1.54* 1.37* 1.46* 2.50* 2.47* 1.23  CALCIUM 9.7 8.8 8.5 8.5 8.1* 8.7  MG  --  1.6  --   --   --   --   PHOS  --  3.8  --   --   --   --    Liver Function Tests:  Recent Labs Lab 03/31/14 1643 04/01/14 0420 04/02/14 0130  AST 61* 65* 52*  ALT 22 21 21   ALKPHOS 70 66 64  BILITOT 0.9 1.0 0.7  PROT 6.5 5.9* 6.0  ALBUMIN 3.0* 2.9* 2.7*   CBC:  Recent Labs Lab 03/31/14 1029 03/31/14 1643 04/01/14 0420 04/02/14 0130 04/03/14 0500  WBC 7.3 7.4 6.3 8.0 7.7  NEUTROABS 6.1 5.7  --   --   --   HGB 12.5* 11.8* 10.6* 11.4* 10.6*  HCT 34.4* 33.1* 31.1* 32.5* 30.7*  MCV 94.2 95.1 97.2 95.3 96.2  PLT 220 193 188 197 181   Cardiac Enzymes:  Recent Labs Lab 04/02/14 1411 04/02/14 1949 04/03/14 0132 04/03/14 0735 04/03/14 1355  CKTOTAL 646* 601* 504* 458* 474*  CKMB 3.7 3.4 3.0 3.0 3.6   CBG:  Recent Labs Lab 04/01/14 0740 04/02/14 0748 04/03/14 0715 04/04/14 0747  GLUCAP 86 109* 96 88    Recent Results (from the past 240 hour(s))  URINE CULTURE     Status: None   Collection Time    03/31/14 10:57 AM      Result Value Ref Range Status   Specimen Description URINE, CLEAN CATCH   Final   Special Requests NONE   Final   Culture  Setup Time     Final   Value: 03/31/2014 14:20     Performed at [facility]     Final   Value: 6,000 COLONIES/ML     Performed at [facility]   Culture     Final   Value:  INSIGNIFICANT GROWTH     Performed at [facility]   Report Status 04/01/2014 FINAL   Final  CULTURE, BLOOD (ROUTINE X 2)     Status: None   Collection Time    03/31/14 11:20 AM      Result Value Ref Range Status   Specimen Description BLOOD RIGHT ANTECUBITAL   Final  Special Requests BOTTLES DRAWN AEROBIC AND ANAEROBIC 10ML   Final   Culture  Setup Time     Final   Value: 03/31/2014 14:59     Performed at [facility]   Culture     Final   Value:        BLOOD CULTURE RECEIVED NO GROWTH TO DATE CULTURE WILL BE HELD FOR 5 DAYS BEFORE ISSUING A FINAL NEGATIVE REPORT     Performed at [facility]   Report Status PENDING   Incomplete  CULTURE, BLOOD (ROUTINE X 2)     Status: None   Collection Time    03/31/14 11:20 AM      Result Value Ref Range Status   Specimen Description BLOOD LEFT ANTECUBITAL   Final   Special Requests BOTTLES DRAWN AEROBIC AND ANAEROBIC 5ML   Final   Culture  Setup Time     Final   Value: 03/31/2014 14:59     Performed at [facility]   Culture     Final   Value:        BLOOD CULTURE RECEIVED NO GROWTH TO DATE CULTURE WILL BE HELD FOR 5 DAYS BEFORE ISSUING A FINAL NEGATIVE REPORT     Performed at [facility]   Report Status PENDING   Incomplete  URINE CULTURE     Status: None   Collection Time    03/31/14  8:40 PM      Result Value Ref Range Status   Specimen Description URINE, CLEAN CATCH   Final   Special Requests NONE   Final   Culture  Setup Time     Final   Value: 04/01/2014 03:32     Performed at [facility]     Final   Value: NO GROWTH     Performed at [facility]   Culture     Final   Value: NO GROWTH     Performed at [facility]   Report Status 04/02/2014 FINAL   Final     Scheduled Meds: . aspirin EC  81 mg Oral Daily  . budesonide-formoterol  1 puff Inhalation BID  . calcium-vitamin D  1 tablet Oral BID  . feeding supplement (ENSURE COMPLETE)  237 mL  Oral TID BM  . guaiFENesin  400 mg Oral 6 times per day  . mirabegron ER  25 mg Oral QHS  . multivitamin with minerals  1 tablet Oral Daily  . pantoprazole  40 mg Oral Daily  . sodium chloride  3 mL Intravenous Q12H  . timolol  1 drop Both Eyes Daily  . [START ON 04/05/2014] Vitamin D (Ergocalciferol)  50,000 Units Oral Q7 days   Continuous Infusions: . sodium chloride 75 mL/hr (04/03/14 1241)   Theodis Blaze, MD  [facility] Pager [phone]  If 7PM-7AM, please contact night-coverage [email or web address] Password [email or web address] 04/04/2014, 11:19 AM   LOS: 4 days

## 2014-04-05 DIAGNOSIS — R5381 Other malaise: Secondary | ICD-10-CM | POA: Diagnosis not present

## 2014-04-05 DIAGNOSIS — Z9181 History of falling: Secondary | ICD-10-CM | POA: Diagnosis not present

## 2014-04-05 DIAGNOSIS — R062 Wheezing: Secondary | ICD-10-CM | POA: Diagnosis not present

## 2014-04-05 DIAGNOSIS — W19XXXA Unspecified fall, initial encounter: Secondary | ICD-10-CM | POA: Diagnosis not present

## 2014-04-05 DIAGNOSIS — R05 Cough: Secondary | ICD-10-CM | POA: Diagnosis not present

## 2014-04-05 DIAGNOSIS — K219 Gastro-esophageal reflux disease without esophagitis: Secondary | ICD-10-CM | POA: Diagnosis not present

## 2014-04-05 DIAGNOSIS — I10 Essential (primary) hypertension: Secondary | ICD-10-CM | POA: Diagnosis not present

## 2014-04-05 DIAGNOSIS — R059 Cough, unspecified: Secondary | ICD-10-CM | POA: Diagnosis not present

## 2014-04-05 DIAGNOSIS — R269 Unspecified abnormalities of gait and mobility: Secondary | ICD-10-CM | POA: Diagnosis not present

## 2014-04-05 DIAGNOSIS — A419 Sepsis, unspecified organism: Secondary | ICD-10-CM | POA: Diagnosis not present

## 2014-04-05 DIAGNOSIS — I2510 Atherosclerotic heart disease of native coronary artery without angina pectoris: Secondary | ICD-10-CM | POA: Diagnosis not present

## 2014-04-05 DIAGNOSIS — E876 Hypokalemia: Secondary | ICD-10-CM | POA: Diagnosis not present

## 2014-04-05 DIAGNOSIS — M6282 Rhabdomyolysis: Secondary | ICD-10-CM | POA: Diagnosis not present

## 2014-04-05 DIAGNOSIS — E785 Hyperlipidemia, unspecified: Secondary | ICD-10-CM | POA: Diagnosis not present

## 2014-04-05 DIAGNOSIS — M2420 Disorder of ligament, unspecified site: Secondary | ICD-10-CM | POA: Diagnosis not present

## 2014-04-05 DIAGNOSIS — M629 Disorder of muscle, unspecified: Secondary | ICD-10-CM | POA: Diagnosis not present

## 2014-04-05 DIAGNOSIS — R32 Unspecified urinary incontinence: Secondary | ICD-10-CM | POA: Diagnosis not present

## 2014-04-05 DIAGNOSIS — I150 Renovascular hypertension: Secondary | ICD-10-CM | POA: Diagnosis not present

## 2014-04-05 DIAGNOSIS — M6281 Muscle weakness (generalized): Secondary | ICD-10-CM | POA: Diagnosis not present

## 2014-04-05 DIAGNOSIS — E43 Unspecified severe protein-calorie malnutrition: Secondary | ICD-10-CM | POA: Diagnosis not present

## 2014-04-05 DIAGNOSIS — E869 Volume depletion, unspecified: Secondary | ICD-10-CM | POA: Diagnosis not present

## 2014-04-05 DIAGNOSIS — N179 Acute kidney failure, unspecified: Secondary | ICD-10-CM | POA: Diagnosis not present

## 2014-04-05 DIAGNOSIS — D649 Anemia, unspecified: Secondary | ICD-10-CM | POA: Diagnosis not present

## 2014-04-05 LAB — CK TOTAL AND CKMB (NOT AT ARMC)
CK, MB: 2.1 ng/mL (ref 0.3–4.0)
Relative Index: 0.9 (ref 0.0–2.5)
Total CK: 225 U/L (ref 7–232)

## 2014-04-05 LAB — COMPREHENSIVE METABOLIC PANEL
ALBUMIN: 2.3 g/dL — AB (ref 3.5–5.2)
ALT: 21 U/L (ref 0–53)
AST: 28 U/L (ref 0–37)
Alkaline Phosphatase: 60 U/L (ref 39–117)
BILIRUBIN TOTAL: 0.3 mg/dL (ref 0.3–1.2)
BUN: 25 mg/dL — AB (ref 6–23)
CHLORIDE: 107 meq/L (ref 96–112)
CO2: 22 mEq/L (ref 19–32)
CREATININE: 1.19 mg/dL (ref 0.50–1.35)
Calcium: 8.7 mg/dL (ref 8.4–10.5)
GFR calc non Af Amer: 54 mL/min — ABNORMAL LOW (ref >90)
GFR, EST AFRICAN AMERICAN: 62 mL/min — AB (ref >90)
Glucose, Bld: 96 mg/dL (ref 70–99)
Potassium: 3.8 mEq/L (ref 3.7–5.3)
Sodium: 140 mEq/L (ref 137–147)
Total Protein: 5.5 g/dL — ABNORMAL LOW (ref 6.0–8.3)

## 2014-04-05 LAB — GLUCOSE, CAPILLARY: GLUCOSE-CAPILLARY: 94 mg/dL (ref 70–99)

## 2014-04-05 MED ORDER — HYDROCODONE-ACETAMINOPHEN 5-325 MG PO TABS: 1.0000 | ORAL_TABLET | ORAL | Status: DC | PRN

## 2014-04-05 MED ORDER — HYDROCOD POLST-CHLORPHEN POLST 10-8 MG/5ML PO LQCR: 5.0000 mL | Freq: Every evening | ORAL | Status: DC | PRN

## 2014-04-05 NOTE — Discharge Summary (Signed)
Physician Discharge Summary  Wayne [name] [id number] DOB: Jun 07, 1926 DOA: 03/31/2014  PCP: [name], MD  Admit date: 03/31/2014 Discharge date: 04/05/2014  Recommendations for Outpatient Follow-up:  1. Pt will need to follow up with PCP in 2-3 weeks post discharge 2. Please obtain BMP to evaluate electrolytes and kidney function 3. Please also check CBC to evaluate Hg and Hct levels 4. will hold antihypertensive regimen that pt was on at home, Atenolol, Benicar  5. Resume Norvasc upon discharge   Discharge Diagnoses: Rhabdomyolysis  Active Problems:   Rhabdomyolysis   Sepsis   Protein-calorie malnutrition, severe  Discharge Condition: Stable  Diet recommendation: Heart healthy diet discussed in details   Brief narrative:  80 year old male with past medical history significant for hypertension and arthritis, presenting to the ED after an episode of fall that occurred one night prior to this admission. He explains he has been seen recently by PCP and was treated in an outpatient setting for dehydration with IVF. He says he was getting fluids in the office nad last night he had to get up and go to the restroom and since it was very dark he fell. He was unable to get up and was down on the floor for more than 12 hours.  In ED, pt noted to be hemodynamically stable, CK elevated consistent with rhabdo and pt admitted for further evaluation.   Assessment and Plan:  Active Problems:  Rhabdomyolysis  - secondary to fall, CK trending down and now WNL this AM - off IVF for 24 hours and doing well, clinically stable  - pt clinically stable, sitting in chair this AM, feels better  - provide supportive care with analgesia as needed  Hypokalemia  - supplemented and WNL this AM  Acute renal failure  - likely from pre renal etiology, rhabdo and worse on Vanc ATN  - Cr continued to trend down, now WNL this AM  - stopped Vancomycin  - pt tolerating regular diet  HTN - blood  pressure soft on admission  - will hold antihypertensive regimen that pt is on at home, Atenolol, Benicar  - resume Norvasc per home medical regimen today only Fever  - unclear etiology but with elevated lactic acid and T max 102 F on admission  - pt remains afebrile over 72 hours, no signs of infectious etiology  - urine culture and sputum culture pending but negative to date  - no infectious etiology noted on CXR, UA unremarkable  - provide tylenol as needed  - cultures have been negative for 4 days so far. Zosyn discontinued 04/03/2014  Acute blood loss anemia  - likely dilutional, no signs of active bleeding  Transaminitis  - mild and likely from rhabdo , resolved  Severe malnutrition  - continue feeding supplementation  - pt tolerating current diet well   Radiological Exams on Admission:  Dg Chest 2 View 03/31/2014 No acute findings.  Dg Lumbar Spine Complete 03/31/2014 Curvature and degenerative changes. No acute or traumatic finding.  Dg Hip Complete Left 03/31/2014 No acute findings. Minimal bilateral hip osteoarthritis.  Ct Head Wo Contrast 03/31/2014 Small rounded area of low attenuation in the peripheral aspect of the right cerebellar hemisphere, possibly representing foliar prominence related to atrophy. Difficult to exclude an infarct. No additional evidence of an acute intracranial abnormality. Atrophy and chronic microvascular white matter ischemic changes. Advanced cervical spondylosis without fracture or subluxation.  Consultants:  None Antibiotics:  Vancomycin 4/29 --> 5/1  Zosyn 4/29 --> 5/2  Code  Status: Full  Family Communication: none at bedside  Discharge Exam: Filed Vitals:   04/05/14 0920  BP: 126/58  Pulse:   Temp:   Resp:    Filed Vitals:   04/04/14 2047 04/05/14 0500 04/05/14 0920 04/05/14 1025  BP: 130/64  126/58   Pulse: 54 57    Temp: 98.3 F (36.8 C) 98.3 F (36.8 C)    TempSrc: Oral Oral    Resp: 18 18    Height:      Weight:  73.8 kg (162  lb 11.2 oz)    SpO2: 97% 97%  98%    General: Pt is alert, follows commands appropriately, not in acute distress Cardiovascular: Regular rate and rhythm, S1/S2 +, no murmurs, no rubs, no gallops Respiratory: Clear to auscultation bilaterally, no wheezing, no crackles, no rhonchi Abdominal: Soft, non tender, non distended, bowel sounds +, no guarding Extremities: no edema, no cyanosis, pulses palpable bilaterally DP and PT  Discharge Instructions  Discharge Orders   Future Orders Complete By Expires   Diet - low sodium heart healthy  As directed    Increase activity slowly  As directed        Medication List    STOP taking these medications       amoxicillin 500 MG capsule  Commonly known as:  AMOXIL     atenolol 25 MG tablet  Commonly known as:  TENORMIN     olmesartan-hydrochlorothiazide 40-12.5 MG per tablet  Commonly known as:  BENICAR HCT      TAKE these medications       albuterol 108 (90 BASE) MCG/ACT inhaler  Commonly known as:  PROVENTIL HFA;VENTOLIN HFA  Inhale 1-2 puffs into the lungs every 6 (six) hours as needed for wheezing or shortness of breath.     amLODipine 5 MG tablet  Commonly known as:  NORVASC  Take 5 mg by mouth daily.     aspirin EC 81 MG tablet  Take 81 mg by mouth daily.     budesonide-formoterol 160-4.5 MCG/ACT inhaler  Commonly known as:  SYMBICORT  Inhale 1 puff into the lungs 2 (two) times daily as needed.     CALTRATE 600+D 600-400 MG-UNIT per tablet  Generic drug:  Calcium Carbonate-Vitamin D  Take 1 tablet by mouth 2 (two) times daily.     chlorpheniramine-HYDROcodone 10-8 MG/5ML Lqcr  Commonly known as:  TUSSIONEX PENNKINETIC ER  Take 5 mLs by mouth at bedtime as needed for cough.     CORICIDIN HBP CONGESTION/COUGH PO  Take 1 tablet by mouth as needed (for cough symptoms).     Fish Oil 1200 MG Caps  Take 1 capsule by mouth 2 (two) times daily.     fluticasone 50 MCG/ACT nasal spray  Commonly known as:  FLONASE  Place  1-2 sprays into both nostrils daily as needed for allergies or rhinitis.     HYDROcodone-acetaminophen 5-325 MG per tablet  Commonly known as:  NORCO/VICODIN  Take 1-2 tablets by mouth every 4 (four) hours as needed for moderate pain.     mirabegron ER 25 MG Tb24 tablet  Commonly known as:  MYRBETRIQ  Take 25 mg by mouth at bedtime.     MUCUS RELIEF 400 MG Tabs tablet  Generic drug:  guaifenesin  Take 400 mg by mouth every 4 (four) hours.     multivitamin with minerals Tabs tablet  Take 1 tablet by mouth daily.     Naftifine HCl 2 % Gel  Apply Naftin gel  to the affected areas between toes 4 and 5 both feet twice daily     omeprazole 40 MG capsule  Commonly known as:  PRILOSEC  Take 40 mg by mouth daily.     pravastatin 40 MG tablet  Commonly known as:  PRAVACHOL  Take 40 mg by mouth daily.     timolol 0.5 % ophthalmic solution  Commonly known as:  BETIMOL  Place 1 drop into both eyes daily.     Vitamin D (Ergocalciferol) 50000 UNITS Caps capsule  Commonly known as:  DRISDOL  Take 50,000 Units by mouth every 7 (seven) days. Takes on Monday           Follow-up Information   Schedule an appointment as soon as possible for a visit with [name], MD.   Specialty:  Internal Medicine   Contact information:   [address] [place] [address] [phone]       Follow up with Faye Ramsay, MD. (As needed, If symptoms worsen)    Specialty:  Internal Medicine   Contact information:   [address] [place] [address] [phone]        The results of significant diagnostics from this hospitalization (including imaging, microbiology, ancillary and laboratory) are listed below for reference.     Microbiology: Recent Results (from the past 240 hour(s))  URINE CULTURE     Status: None   Collection Time    03/31/14 10:57 AM      Result Value Ref Range Status   Specimen Description URINE, CLEAN CATCH   Final   Special Requests  NONE   Final   Culture  Setup Time     Final   Value: 03/31/2014 14:20     Performed at [facility]     Final   Value: 6,000 COLONIES/ML     Performed at [facility]   Culture     Final   Value: INSIGNIFICANT GROWTH     Performed at [facility]   Report Status 04/01/2014 FINAL   Final  CULTURE, BLOOD (ROUTINE X 2)     Status: None   Collection Time    03/31/14 11:20 AM      Result Value Ref Range Status   Specimen Description BLOOD RIGHT ANTECUBITAL   Final   Special Requests BOTTLES DRAWN AEROBIC AND ANAEROBIC 10ML   Final   Culture  Setup Time     Final   Value: 03/31/2014 14:59     Performed at [facility]   Culture     Final   Value:        BLOOD CULTURE RECEIVED NO GROWTH TO DATE CULTURE WILL BE HELD FOR 5 DAYS BEFORE ISSUING A FINAL NEGATIVE REPORT     Performed at [facility]   Report Status PENDING   Incomplete  CULTURE, BLOOD (ROUTINE X 2)     Status: None   Collection Time    03/31/14 11:20 AM      Result Value Ref Range Status   Specimen Description BLOOD LEFT ANTECUBITAL   Final   Special Requests BOTTLES DRAWN AEROBIC AND ANAEROBIC 5ML   Final   Culture  Setup Time     Final   Value: 03/31/2014 14:59     Performed at [facility]   Culture     Final   Value:        BLOOD CULTURE RECEIVED NO GROWTH TO DATE  CULTURE WILL BE HELD FOR 5 DAYS BEFORE ISSUING A FINAL NEGATIVE REPORT     Performed at [facility]   Report Status PENDING   Incomplete  URINE CULTURE     Status: None   Collection Time    03/31/14  8:40 PM      Result Value Ref Range Status   Specimen Description URINE, CLEAN CATCH   Final   Special Requests NONE   Final   Culture  Setup Time     Final   Value: 04/01/2014 03:32     Performed at [facility]     Final   Value: NO GROWTH     Performed at [facility]   Culture     Final   Value: NO GROWTH     Performed at [facility]    Report Status 04/02/2014 FINAL   Final     Labs: Basic Metabolic Panel:  Recent Labs Lab 03/31/14 1029 03/31/14 1643 04/01/14 0420 04/02/14 0130 04/03/14 0500 04/04/14 0526 04/05/14 0420  NA 135* 136* 139 139 143 143 140  K 4.1 3.9 3.5* 3.9 4.0 3.9 3.8  CL 96 101 105 104 110 109 107  CO2 22 20 19  17* 20 21 22   GLUCOSE 126* 122* 88 116* 126* 89 96  BUN 37* 30* 30* 36* 39* 26* 25*  CREATININE 1.54* 1.37* 1.46* 2.50* 2.47* 1.23 1.19  CALCIUM 9.7 8.8 8.5 8.5 8.1* 8.7 8.7  MG  --  1.6  --   --   --   --   --   PHOS  --  3.8  --   --   --   --   --    Liver Function Tests:  Recent Labs Lab 03/31/14 1643 04/01/14 0420 04/02/14 0130 04/05/14 0420  AST 61* 65* 52* 28  ALT 22 21 21 21   ALKPHOS 70 66 64 60  BILITOT 0.9 1.0 0.7 0.3  PROT 6.5 5.9* 6.0 5.5*  ALBUMIN 3.0* 2.9* 2.7* 2.3*   CBC:  Recent Labs Lab 03/31/14 1029 03/31/14 1643 04/01/14 0420 04/02/14 0130 04/03/14 0500  WBC 7.3 7.4 6.3 8.0 7.7  NEUTROABS 6.1 5.7  --   --   --   HGB 12.5* 11.8* 10.6* 11.4* 10.6*  HCT 34.4* 33.1* 31.1* 32.5* 30.7*  MCV 94.2 95.1 97.2 95.3 96.2  PLT 220 193 188 197 181   Cardiac Enzymes:  Recent Labs Lab 04/02/14 1949 04/03/14 0132 04/03/14 0735 04/03/14 1355 04/05/14 0420  CKTOTAL 601* 504* 458* 474* 225  CKMB 3.4 3.0 3.0 3.6 2.1   CBG:  Recent Labs Lab 04/01/14 0740 04/02/14 0748 04/03/14 0715 04/04/14 0747 04/05/14 0735  GLUCAP 86 109* 96 88 94   SIGNED: Time coordinating discharge: Over 30 minutes  Theodis Blaze, MD  [facility] 04/05/2014, 11:08 AM Pager [phone]  If 7PM-7AM, please contact night-coverage [email or web address] Password [email or web address]

## 2014-04-05 NOTE — Discharge Instructions (Signed)
Rhabdomyolysis Rhabdomyolysis is the breakdown of muscle fibers due to injury. The injury may come from physical damage to the muscle like an injury but other causes are:  High fever (hyperthermia).  Seizures (convulsions).  Low phosphate levels.  Diseases of metabolism.  Heatstroke.  Drug toxicity.  Over exertion.  Alcoholism.  Muscle is cut off from oxygen (anoxia).  The squeezing of nerves and blood vessels (compartment syndrome). Some drugs which may cause the breakdown of muscle are:  Antibiotics.  Statins.  Alcohol.  Animal toxins. Myoglobin is a substance which helps muscle use oxygen. When the muscle is damaged, the myoglobin is released into the bloodstream. It is filtered out of the bloodstream by the kidneys. Myoglobin may block up the kidneys. This may cause damage, such as kidney failure. It also breaks down into other damaging toxic parts, which also cause kidney failure.  SYMPTOMS   Dark, red, or tea colored urine.  Weakness of affected muscles.  Weight gain from water retention.  Joint aches and pains.  Irregular heart from high potassium in the blood.  Muscle tenderness or aching.  Generalized weakness.  Seizures.  Feeling tired (fatigue). DIAGNOSIS  Your caregiver may find muscle tenderness on exam and suspect the problem. Urine tests and blood work can confirm the problem. TREATMENT   Early and aggressive treatment with large amounts of fluids may help prevent kidney failure.  Water producing medicine (diuretic) may be used to help flush the kidneys.  High potassium and calcium problems (electrolyte) in your blood may need treatment. HOME CARE INSTRUCTIONS  This problem is usually cared for in a hospital. If you are allowed to go home and require dialysis, make sure you keep all appointments for lab work and dialysis. Not doing so could result in death. Document Released: Nov 04, 2004 Document Revised: 02/11/2012 Document Reviewed:  05/16/2009 [facility] Patient Information 2014 [place].

## 2014-04-05 NOTE — Progress Notes (Signed)
Patient is set to discharge to [facility] today. Patient & son, [name] aware. Discharge packet in [place], [place] aware. [facility] scheduled for 1:30pm transport pickup (Service Request Id: [phone]).   Clinical Social Work Department CLINICAL SOCIAL WORK PLACEMENT NOTE 04/05/2014  Patient:  Wayne [name], Wayne [name]  Account Number:  [id number] Admit date:  03/31/2014  Clinical Social Worker:  Renold Genta  Date/time:  04/02/2014 03:31 PM  Clinical Social Work is seeking post-discharge placement for this patient at the following level of care:   SKILLED NURSING   (*CSW will update this form in Epic as items are completed)   04/02/2014  Patient/family provided with [place] Department of Clinical Social Work's list of facilities offering this level of care within the geographic area requested by the patient (or if unable, by the patient's family).  04/02/2014  Patient/family informed of their freedom to choose among providers that offer the needed level of care, that participate in Medicare, Medicaid or managed care program needed by the patient, have an available bed and are willing to accept the patient.  04/02/2014  Patient/family informed of [facility]' ownership interest in [facility], as well as of the fact that they are under no obligation to receive care at this facility.  PASARR submitted to EDS on 04/02/2014 PASARR number received from EDS on 04/02/2014  FL2 transmitted to all facilities in geographic area requested by pt/family on  04/02/2014 FL2 transmitted to all facilities within larger geographic area on   Patient informed that his/her managed care company has contracts with or will negotiate with  certain facilities, including the following:     Patient/family informed of bed offers received:  04/02/2014 Patient chooses bed at [facility] Physician recommends and patient chooses bed at    Patient to be transferred to [place] on  04/05/2014 Patient  to be transferred to facility by [facility]  The following physician request were entered in Epic:   Additional Comments:   Raynaldo Opitz, [place] Worker cell #: [phone]

## 2014-04-05 NOTE — Progress Notes (Signed)
Physical Therapy Treatment Patient Details Name: Wayne [name] MRN: [id number] DOB: 23-Apr-1926 Today's Date: 04/05/2014    History of Present Illness Pt is an 80 year old male with Hx of HTN and arthitis, admitted 4/29 with dehydration and rhabdomyolysis secondary to fall at home.    PT Comments    Pt progressing well and reports feeling better.  Pt to d/c to [facility] today.  Follow Up Recommendations  SNF     Equipment Recommendations  Rolling walker with 5" wheels    Recommendations for Other Services       Precautions / Restrictions Precautions Precautions: Fall    Mobility  Bed Mobility Overal bed mobility: Needs Assistance         Sit to supine: Supervision   General bed mobility comments: pt up on BSC upon entering, requested assist for hygiene  Transfers Overall transfer level: Needs assistance Equipment used: Rolling walker (2 wheeled) Transfers: Sit to/from Stand Sit to Stand: Min guard         General transfer comment: verbal cues for safe RW distance, no shakiness observed today  Ambulation/Gait Ambulation/Gait assistance: Min guard Ambulation Distance (Feet): 120 Feet Assistive device: Rolling walker (2 wheeled) Gait Pattern/deviations: Step-through pattern;Trunk flexed Gait velocity: decr   General Gait Details: pt reports feeling better, reports his knee pain bothering him - to call son to bring his brace (hx of surgery)   Stairs            Wheelchair Mobility    Modified Rankin (Stroke Patients Only)       Balance                                    Cognition Arousal/Alertness: Awake/alert Behavior During Therapy: WFL for tasks assessed/performed Overall Cognitive Status: Within Functional Limits for tasks assessed                      Exercises General Exercises - Lower Extremity Ankle Circles/Pumps: AROM;Both;15 reps;Supine Quad Sets: AROM;Both;Supine;10 reps Gluteal Sets: AROM;Both;15  reps;Supine Short Arc Quad: AROM;Both;15 reps;Supine Heel Slides: AROM;Both;15 reps;Supine Hip ABduction/ADduction: AROM;Both;15 reps;Supine Straight Leg Raises: 15 reps;Both;AROM;AAROM;Other (comment) (assist at the end of reps due to fatigue on the L LE) Other Exercises Other Exercises: pillow squeezes x15 bilaterally    General Comments        Pertinent Vitals/Pain No specific c/o pain until chronic knee pain during ambulation, repositioned to comfort in supine end of session    Home Living                      Prior Function            PT Goals (current goals can now be found in the care plan section) Progress towards PT goals: Progressing toward goals    Frequency  Min 3X/week    PT Plan Current plan remains appropriate    Co-evaluation             End of Session Equipment Utilized During Treatment: Gait belt Activity Tolerance: Patient tolerated treatment well Patient left: in bed;with call bell/phone within reach;with bed alarm set     Time: 0945-1010 PT Time Calculation (min): 25 min  Charges:  $Gait Training: 8-22 mins $Therapeutic Exercise: 8-22 mins                    G Codes:  Junius Argyle 04/05/2014, 10:30 AM Carmelia Bake, PT, DPT 04/05/2014 Pager: [phone]

## 2014-04-05 NOTE — Progress Notes (Signed)
Patient discharged to SNF [facility]) via ambulance. Discharge packet prepared by CSW and given to EMS transporters for the facility. Patient alert and oriented denies any pain/distress. No wound noted, skin intact.

## 2014-04-06 ENCOUNTER — Non-Acute Institutional Stay (SKILLED_NURSING_FACILITY): Payer: Medicare Other | Admitting: Adult Health

## 2014-04-06 ENCOUNTER — Encounter: Payer: Self-pay | Admitting: *Deleted

## 2014-04-06 DIAGNOSIS — E43 Unspecified severe protein-calorie malnutrition: Secondary | ICD-10-CM

## 2014-04-06 DIAGNOSIS — E785 Hyperlipidemia, unspecified: Secondary | ICD-10-CM

## 2014-04-06 DIAGNOSIS — I2510 Atherosclerotic heart disease of native coronary artery without angina pectoris: Secondary | ICD-10-CM

## 2014-04-06 DIAGNOSIS — D649 Anemia, unspecified: Secondary | ICD-10-CM

## 2014-04-06 DIAGNOSIS — I150 Renovascular hypertension: Secondary | ICD-10-CM | POA: Diagnosis not present

## 2014-04-06 DIAGNOSIS — M6282 Rhabdomyolysis: Secondary | ICD-10-CM | POA: Diagnosis not present

## 2014-04-06 LAB — CULTURE, BLOOD (ROUTINE X 2)
CULTURE: NO GROWTH
Culture: NO GROWTH

## 2014-04-08 ENCOUNTER — Non-Acute Institutional Stay (SKILLED_NURSING_FACILITY): Payer: Medicare Other | Admitting: Internal Medicine

## 2014-04-08 DIAGNOSIS — I150 Renovascular hypertension: Secondary | ICD-10-CM

## 2014-04-08 DIAGNOSIS — D649 Anemia, unspecified: Secondary | ICD-10-CM | POA: Diagnosis not present

## 2014-04-08 DIAGNOSIS — E876 Hypokalemia: Secondary | ICD-10-CM

## 2014-04-08 DIAGNOSIS — E43 Unspecified severe protein-calorie malnutrition: Secondary | ICD-10-CM | POA: Diagnosis not present

## 2014-04-09 NOTE — Progress Notes (Signed)
HISTORY & PHYSICAL  DATE: 04/08/2014   FACILITY: [facility]  LEVEL OF CARE: SNF (31)  ALLERGIES:  No Known Allergies  CHIEF COMPLAINT:  Manage hypertension, acute blood loss anemia and hypokalemia  HISTORY OF PRESENT ILLNESS: Patient is an 80 year old African American male who was hospitalized secondary to rhabdomyolysis and acute renal failure after a fall. He is been admitted to this facility for short-term rehabilitation.  ANEMIA: The anemia has been stable. The patient denies fatigue, melena or hematochezia. No complications from the medications currently being used.  HTN: Pt 's HTN remains stable.  Denies CP, sob, DOE, pedal edema, headaches, dizziness or visual disturbances.  No complications from the medications currently being used.  Last BP : 108/55.  HYPOKALEMIA: The patient's hypokalemia remains stable. Patient denies muscle cramping or palpitations. No complications reported from current potassium supplementation.  PAST MEDICAL HISTORY :  Past Medical History  Diagnosis Date  . Hypertension   . Asthma     in childhood  . Shortness of breath   . Arthritis     PAST SURGICAL HISTORY: Past Surgical History  Procedure Laterality Date  . Ballon angioplasty  03/18/2012  . Knee surgery  x 2   . Back surgery    . Right wrist    . Left ankle    . Appendectomy      SOCIAL HISTORY:  reports that he has quit smoking. He has never used smokeless tobacco. He reports that he drinks alcohol. He reports that he does not use illicit drugs.  FAMILY HISTORY:  Family History  Problem Relation Age of Onset  . Dementia Mother   . Cancer Father     CURRENT MEDICATIONS: Reviewed per MAR/see medication list  REVIEW OF SYSTEMS:  See HPI otherwise 14 point ROS is negative.  PHYSICAL EXAMINATION  VS:  See VS section  GENERAL: no acute distress, normal body habitus EYES: conjunctivae normal, sclerae normal, normal eye lids MOUTH/THROAT: lips  without lesions,no lesions in the mouth,tongue is without lesions,uvula elevates in midline NECK: supple, trachea midline, no neck masses, no thyroid tenderness, no thyromegaly LYMPHATICS: no LAN in the neck, no supraclavicular LAN RESPIRATORY: breathing is even & unlabored, BS CTAB CARDIAC: RRR, no murmur,no extra heart sounds, +1 bilateral lower extremity edema GI:  ABDOMEN: abdomen soft, normal BS, no masses, no tenderness  LIVER/SPLEEN: no hepatomegaly, no splenomegaly MUSCULOSKELETAL: HEAD: normal to inspection & palpation BACK: no kyphosis, scoliosis or spinal processes tenderness EXTREMITIES: LEFT UPPER EXTREMITY: full range of motion, normal strength & tone RIGHT UPPER EXTREMITY:  full range of motion, normal strength & tone LEFT LOWER EXTREMITY:  full range of motion, normal strength & tone RIGHT LOWER EXTREMITY:  full range of motion, normal strength & tone PSYCHIATRIC: the patient is alert & oriented to person, affect & behavior appropriate  LABS/RADIOLOGY:  Labs reviewed: Basic Metabolic Panel:  Recent Labs  03/31/14 1029 03/31/14 1643  04/03/14 0500 04/04/14 0526 04/05/14 0420  NA 135* 136*  < > 143 143 140  K 4.1 3.9  < > 4.0 3.9 3.8  CL 96 101  < > 110 109 107  CO2 22 20  < > 20 21 22   GLUCOSE 126* 122*  < > 126* 89 96  BUN 37* 30*  < > 39* 26* 25*  CREATININE 1.54* 1.37*  < > 2.47* 1.23 1.19  CALCIUM 9.7 8.8  < > 8.1* 8.7 8.7  MG  --  1.6  --   --   --   --  PHOS  --  3.8  --   --   --   --   < > = values in this interval not displayed. Liver Function Tests:  Recent Labs  04/01/14 0420 04/02/14 0130 04/05/14 0420  AST 65* 52* 28  ALT 21 21 21   ALKPHOS 66 64 60  BILITOT 1.0 0.7 0.3  PROT 5.9* 6.0 5.5*  ALBUMIN 2.9* 2.7* 2.3*   CBC:  Recent Labs  03/31/14 1029 03/31/14 1643 04/01/14 0420 04/02/14 0130 04/03/14 0500  WBC 7.3 7.4 6.3 8.0 7.7  NEUTROABS 6.1 5.7  --   --   --   HGB 12.5* 11.8* 10.6* 11.4* 10.6*  HCT 34.4* 33.1* 31.1* 32.5*  30.7*  MCV 94.2 95.1 97.2 95.3 96.2  PLT 220 193 188 197 181   Cardiac Enzymes:  Recent Labs  04/03/14 0735 04/03/14 1355 04/05/14 0420  CKTOTAL 458* 474* 225  CKMB 3.0 3.6 2.1   CBG:  Recent Labs  04/03/14 0715 04/04/14 0747 04/05/14 0735  GLUCAP 96 88 94    CT HEAD WITHOUT CONTRAST   CT CERVICAL SPINE WITHOUT CONTRAST   TECHNIQUE: Multidetector CT imaging of the head and cervical spine was performed following the standard protocol without intravenous contrast. Multiplanar CT image reconstructions of the cervical spine were also generated.   COMPARISON:  None.   FINDINGS: CT HEAD FINDINGS   There is a small area of rounded low-attenuation in the periphery of the right cerebral hemisphere (series 3, image 7). No additional evidence of an acute infarct, acute hemorrhage, mass lesion, mass effect or hydrocephalus. Atrophy. Periventricular low attenuation. Scattered mucosal thickening/ opacification in the ethmoid air cells. Mastoid air cells are clear. No fracture.   CT CERVICAL SPINE FINDINGS   Alignment is anatomic. Vertebral body height is maintained. Endplate degenerative changes, loss of disc space height and uncovertebral hypertrophy are seen at C3-4, C4-5, C5-6 and C6-7. Multilevel facet hypertrophy is seen as well. Visualized upper ribs appear intact. Biapical pleural parenchymal scarring, left greater than right.   IMPRESSION: 1. Small rounded area of low attenuation in the peripheral aspect of the right cerebellar hemisphere, possibly representing foliar prominence related to atrophy. Difficult to exclude an infarct. 2. No additional evidence of an acute intracranial abnormality. 3. Atrophy and chronic microvascular white matter ischemic changes. 4. Advanced cervical spondylosis without fracture or subluxation.     LEFT HIP - COMPLETE 2+ VIEW   COMPARISON:  None.   FINDINGS: No fracture or dislocation. Minimal subchondral sclerosis  and osteophytosis along the acetabuli bilaterally. Hip joint space is maintained. Degenerative changes are seen in the visualized portion of the lumbar spine. Obturator rings are intact.   IMPRESSION: 1. No acute findings. 2. Minimal bilateral hip osteoarthritis.   LUMBAR SPINE - COMPLETE 4+ VIEW   COMPARISON:  None.   FINDINGS: Five lumbar type vertebral bodies. Curvature convex to the right in the thoracolumbar region and to the left in the lumbar region disc space narrowing at all levels. No evidence of fracture. No subluxation. No focal lesion.   IMPRESSION: Curvature and degenerative changes.  No acute or traumatic finding.   CHEST  2 VIEW   COMPARISON:  03/25/2014.   FINDINGS: Trachea is midline. Heart size stable. Pleural calcification is seen along the lateral base of the left hemi thorax. Biapical pleural thickening, left greater than right. Lungs are otherwise clear. No pleural fluid. No pneumothorax.   IMPRESSION: No acute findings. RENAL/URINARY TRACT ULTRASOUND COMPLETE   COMPARISON:  10/19/2013 ultrasound  FINDINGS: Right Kidney:   Length: 10.6 cm. Mild cortical renal atrophy is identified with mildly increased renal echogenicity. There is no evidence of solid mass or hydronephrosis.   Left Kidney:   Length: 11.4 cm. Mild cortical renal atrophy is identified with mildly increased renal echogenicity. There is no evidence of solid renal mass or hydronephrosis.   Bladder:   A Foley catheter is noted within the collapsed bladder.   IMPRESSION: Mild cortical renal atrophy and mildly increased renal echogenicity bilaterally compatible with medical renal disease. No evidence of hydronephrosis.    ASSESSMENT/PLAN:  Hypertension-well controlled Hypokalemia-well repleted. Recheck Anemia-recheck Severe malnutrition-continue nutritional supplementation GERD-continue PPI Acute renal failure-recheck renal functions Check CBC and BMP  I have  reviewed patient's medical records received at admission/from hospitalization.  CPT CODE: 60630  [facility] [place], [place] [phone]

## 2014-04-15 ENCOUNTER — Encounter: Payer: Self-pay | Admitting: Adult Health

## 2014-04-15 ENCOUNTER — Non-Acute Institutional Stay (SKILLED_NURSING_FACILITY): Payer: Medicare Other | Admitting: Adult Health

## 2014-04-15 DIAGNOSIS — M6282 Rhabdomyolysis: Secondary | ICD-10-CM

## 2014-04-15 DIAGNOSIS — I150 Renovascular hypertension: Secondary | ICD-10-CM | POA: Diagnosis not present

## 2014-04-15 DIAGNOSIS — E785 Hyperlipidemia, unspecified: Secondary | ICD-10-CM | POA: Diagnosis not present

## 2014-04-15 DIAGNOSIS — D649 Anemia, unspecified: Secondary | ICD-10-CM

## 2014-04-15 DIAGNOSIS — E43 Unspecified severe protein-calorie malnutrition: Secondary | ICD-10-CM

## 2014-04-15 DIAGNOSIS — I2510 Atherosclerotic heart disease of native coronary artery without angina pectoris: Secondary | ICD-10-CM

## 2014-04-15 NOTE — Progress Notes (Signed)
Patient ID: Wayne [name], male   DOB: 10-15-1926, 80 y.o.   MRN: [id number]                 PROGRESS NOTE  DATE: 04/15/14  FACILITY: Nursing Home Location: [facility] [place] [facility]  LEVEL OF CARE: SNF (31)  Acute Visit  CHIEF COMPLAINT:  Discharge Notes  HISTORY OF PRESENT ILLNESS: This is an 80 year old male who is for discharge homehas been admitted to [facility] on 04/[facility]14 from [facility] with Rhabdomyolysis secondary to fall. Patient was admitted to this facility for short-term rehabilitation after the patient's recent hospitalization.  Patient has completed SNF rehabilitation and therapy has cleared the patient for discharge.  REASSESSMENT OF ONGOING PROBLEM(S):  HTN: Pt 's HTN remains stable.  Denies CP, sob, DOE, pedal edema, headaches, dizziness or visual disturbances.  No complications from the medications currently being used.  Last BP : 129/73  HYPERLIPIDEMIA: No complications from the medications presently being used.   ANEMIA: The anemia has been stable. The patient denies fatigue, melena or hematochezia. No complications from the medications currently being used.  5/15 Hgb 10.6   PAST MEDICAL HISTORY : Reviewed.  No changes/see problem list  CURRENT MEDICATIONS: Reviewed per MAR/see medication list  REVIEW OF SYSTEMS:  GENERAL: no change in appetite, no fatigue, no weight changes, no fever, chills or weakness RESPIRATORY: no cough, SOB, DOE, wheezing, hemoptysis CARDIAC: no chest pain, edema or palpitations GI: no abdominal pain, diarrhea, constipation, heart burn, nausea or vomiting  PHYSICAL EXAMINATION  GENERAL: no acute distress, normal body habitus NECK: supple, trachea midline, no neck masses, no thyroid tenderness, no thyromegaly LYMPHATICS: no LAN in the neck, no supraclavicular LAN RESPIRATORY: breathing is even & unlabored, BS CTAB CARDIAC: RRR, no murmur,no extra heart sounds, no edema GI: abdomen soft, normal BS, no masses, no  tenderness, no hepatomegaly, no splenomegaly EXTREMITIES:  Able to move all 4 extremities and ambulates with walker PSYCHIATRIC: the patient is alert & oriented to person, affect & behavior appropriate  LABS/RADIOLOGY: Labs reviewed: Basic Metabolic Panel:  Recent Labs  03/31/14 1029 03/31/14 1643  04/03/14 0500 04/04/14 0526 04/05/14 0420  NA 135* 136*  < > 143 143 140  K 4.1 3.9  < > 4.0 3.9 3.8  CL 96 101  < > 110 109 107  CO2 22 20  < > 20 21 22   GLUCOSE 126* 122*  < > 126* 89 96  BUN 37* 30*  < > 39* 26* 25*  CREATININE 1.54* 1.37*  < > 2.47* 1.23 1.19  CALCIUM 9.7 8.8  < > 8.1* 8.7 8.7  MG  --  1.6  --   --   --   --   PHOS  --  3.8  --   --   --   --   < > = values in this interval not displayed.   Liver Function Tests:  Recent Labs  04/01/14 0420 04/02/14 0130 04/05/14 0420  AST 65* 52* 28  ALT 21 21 21   ALKPHOS 66 64 60  BILITOT 1.0 0.7 0.3  PROT 5.9* 6.0 5.5*  ALBUMIN 2.9* 2.7* 2.3*   CBC:  Recent Labs  03/31/14 1029 03/31/14 1643 04/01/14 0420 04/02/14 0130 04/03/14 0500  WBC 7.3 7.4 6.3 8.0 7.7  NEUTROABS 6.1 5.7  --   --   --   HGB 12.5* 11.8* 10.6* 11.4* 10.6*  HCT 34.4* 33.1* 31.1* 32.5* 30.7*  MCV 94.2 95.1 97.2 95.3 96.2  PLT 220 193 188 197 181    Cardiac Enzymes:  Recent Labs  04/03/14 0735 04/03/14 1355 04/05/14 0420  CKTOTAL 458* 474* 225  CKMB 3.0 3.6 2.1    CBG:  Recent Labs  04/03/14 0715 04/04/14 0747 04/05/14 0735  GLUCAP 96 88 94     ASSESSMENT/PLAN:  Rhabdomyolysis secondary to fall - stable; for Home health PT, OT and Nursing. Hypertension - well controlled; continue Norvasc Anemia - stable Hyperlipidemia - continue pravastatin Severe protein calorie malnutrition  - continue supplementation CAD - stable; continue aspirin   I have filled out patient's discharge paperwork and written prescriptions.  Patient will receive home health PT, OT and Nursing.   Total discharge time: Less than 30  minutes Discharge time involved coordination of the discharge process with Education officer, museum, nursing staff and therapy department. Medical justification for home health services verified.   CPT CODE: 16109   Seth Bake - NP [facility] [phone]

## 2014-04-15 NOTE — Progress Notes (Signed)
Patient ID: Wayne [name], male   DOB: [birth year], 80 y.o.   MRN: [id number]               PROGRESS NOTE  DATE: 04/06/2014  FACILITY: Nursing Home Location: [facility]  LEVEL OF CARE: SNF (31)  Acute Visit  CHIEF COMPLAINT:  Follow-up hospitalization  HISTORY OF PRESENT ILLNESS: This is an 80 year old male who has been admitted to [facility] on 04/05/14 from [facility] with Rhabdomyolysis secondary to fall. He has been admitted for a short-term rehabilitation.  REASSESSMENT OF ONGOING PROBLEM(S):  HTN: Pt 's HTN remains stable.  Denies CP, sob, DOE, pedal edema, headaches, dizziness or visual disturbances.  No complications from the medications currently being used.  Last BP : 126/67  ANEMIA: The anemia has been stable. The patient denies fatigue, melena or hematochezia. No complications from the medications currently being used.  5/15 Hgb 10.6  CAD: The angina has been stable. The patient denies dyspnea on exertion, orthopnea, pedal edema, palpitations and paroxysmal nocturnal dyspnea. No complications noted from the medication presently being used.   PAST MEDICAL HISTORY : Reviewed.  No changes/see problem list  CURRENT MEDICATIONS: Reviewed per MAR/see medication list  REVIEW OF SYSTEMS:  GENERAL: no change in appetite, no fatigue, no weight changes, no fever, chills or weakness RESPIRATORY: no cough, SOB, DOE, wheezing, hemoptysis CARDIAC: no chest pain, edema or palpitations GI: no abdominal pain, diarrhea, constipation, heart burn, nausea or vomiting  PHYSICAL EXAMINATION  GENERAL: no acute distress, normal body habitus EYES: conjunctivae normal, sclerae normal, normal eye lids NECK: supple, trachea midline, no neck masses, no thyroid tenderness, no thyromegaly LYMPHATICS: no LAN in the neck, no supraclavicular LAN RESPIRATORY: breathing is even & unlabored, BS CTAB CARDIAC: RRR, no murmur,no extra heart sounds, no edema GI: abdomen  soft, normal BS, no masses, no tenderness, no hepatomegaly, no splenomegaly PSYCHIATRIC: the patient is alert & oriented to person, affect & behavior appropriate  LABS/RADIOLOGY: Labs reviewed: Basic Metabolic Panel:  Recent Labs  03/31/14 1029 03/31/14 1643  04/03/14 0500 04/04/14 0526 04/05/14 0420  NA 135* 136*  < > 143 143 140  K 4.1 3.9  < > 4.0 3.9 3.8  CL 96 101  < > 110 109 107  CO2 22 20  < > 20 21 22   GLUCOSE 126* 122*  < > 126* 89 96  BUN 37* 30*  < > 39* 26* 25*  CREATININE 1.54* 1.37*  < > 2.47* 1.23 1.19  CALCIUM 9.7 8.8  < > 8.1* 8.7 8.7  MG  --  1.6  --   --   --   --   PHOS  --  3.8  --   --   --   --   < > = values in this interval not displayed. Liver Function Tests:  Recent Labs  04/01/14 0420 04/02/14 0130 04/05/14 0420  AST 65* 52* 28  ALT 21 21 21   ALKPHOS 66 64 60  BILITOT 1.0 0.7 0.3  PROT 5.9* 6.0 5.5*  ALBUMIN 2.9* 2.7* 2.3*   No results found for this basename: LIPASE, AMYLASE,  in the last 8760 hours No results found for this basename: AMMONIA,  in the last 8760 hours CBC:  Recent Labs  03/31/14 1029 03/31/14 1643 04/01/14 0420 04/02/14 0130 04/03/14 0500  WBC 7.3 7.4 6.3 8.0 7.7  NEUTROABS 6.1 5.7  --   --   --   HGB 12.5* 11.8* 10.6* 11.4*  10.6*  HCT 34.4* 33.1* 31.1* 32.5* 30.7*  MCV 94.2 95.1 97.2 95.3 96.2  PLT 220 193 188 197 181   A1C: No components found with this basename: A1C,  Lipid Panel: No results found for this basename: LDL, HDL, TRIGLYCERIDE, TOTALCHOL,  in the last 8760 hours Cardiac Enzymes:  Recent Labs  04/03/14 0735 04/03/14 1355 04/05/14 0420  CKTOTAL 458* 474* 225  CKMB 3.0 3.6 2.1   BNP: No components found with this basename: POCBNP,  CBG:  Recent Labs  04/03/14 0715 04/04/14 0747 04/05/14 0735  GLUCAP 96 88 94     ASSESSMENT/PLAN:  Rhabdomyolysis secondary to fall - stable; for rehabilitation Hypertension - well controlled; continue Norvasc Anemia - stable Hyperlipidemia -  continue pravastatin Severe protein calorie malnutrition  - continue supplementation CAD - stable; continue aspirin  CPT CODE: 95284  Monina Vargas - NP [facility] [phone]

## 2014-04-21 DIAGNOSIS — R32 Unspecified urinary incontinence: Secondary | ICD-10-CM | POA: Diagnosis not present

## 2014-04-21 DIAGNOSIS — Z79899 Other long term (current) drug therapy: Secondary | ICD-10-CM | POA: Diagnosis not present

## 2014-04-21 DIAGNOSIS — T795XXA Traumatic anuria, initial encounter: Secondary | ICD-10-CM | POA: Diagnosis not present

## 2014-04-21 DIAGNOSIS — N183 Chronic kidney disease, stage 3 unspecified: Secondary | ICD-10-CM | POA: Diagnosis not present

## 2014-04-21 DIAGNOSIS — N182 Chronic kidney disease, stage 2 (mild): Secondary | ICD-10-CM | POA: Diagnosis not present

## 2014-04-21 DIAGNOSIS — E860 Dehydration: Secondary | ICD-10-CM | POA: Diagnosis not present

## 2014-04-21 DIAGNOSIS — N19 Unspecified kidney failure: Secondary | ICD-10-CM | POA: Diagnosis not present

## 2014-04-27 DIAGNOSIS — I10 Essential (primary) hypertension: Secondary | ICD-10-CM | POA: Diagnosis not present

## 2014-04-27 DIAGNOSIS — W01119A Fall on same level from slipping, tripping and stumbling with subsequent striking against unspecified sharp object, initial encounter: Secondary | ICD-10-CM | POA: Diagnosis not present

## 2014-04-27 DIAGNOSIS — Q7649 Other congenital malformations of spine, not associated with scoliosis: Secondary | ICD-10-CM | POA: Diagnosis not present

## 2014-04-27 DIAGNOSIS — E43 Unspecified severe protein-calorie malnutrition: Secondary | ICD-10-CM | POA: Diagnosis not present

## 2014-04-27 DIAGNOSIS — M1610 Unilateral primary osteoarthritis, unspecified hip: Secondary | ICD-10-CM | POA: Diagnosis not present

## 2014-04-27 DIAGNOSIS — M6282 Rhabdomyolysis: Secondary | ICD-10-CM | POA: Diagnosis not present

## 2014-04-27 DIAGNOSIS — M25569 Pain in unspecified knee: Secondary | ICD-10-CM | POA: Diagnosis not present

## 2014-04-27 DIAGNOSIS — I2510 Atherosclerotic heart disease of native coronary artery without angina pectoris: Secondary | ICD-10-CM | POA: Diagnosis not present

## 2014-04-28 DIAGNOSIS — I2510 Atherosclerotic heart disease of native coronary artery without angina pectoris: Secondary | ICD-10-CM | POA: Diagnosis not present

## 2014-04-28 DIAGNOSIS — E8809 Other disorders of plasma-protein metabolism, not elsewhere classified: Secondary | ICD-10-CM | POA: Diagnosis not present

## 2014-04-28 DIAGNOSIS — I150 Renovascular hypertension: Secondary | ICD-10-CM | POA: Diagnosis not present

## 2014-04-28 DIAGNOSIS — E46 Unspecified protein-calorie malnutrition: Secondary | ICD-10-CM | POA: Diagnosis not present

## 2014-04-29 DIAGNOSIS — I2510 Atherosclerotic heart disease of native coronary artery without angina pectoris: Secondary | ICD-10-CM | POA: Diagnosis not present

## 2014-04-29 DIAGNOSIS — M25569 Pain in unspecified knee: Secondary | ICD-10-CM | POA: Diagnosis not present

## 2014-04-29 DIAGNOSIS — E43 Unspecified severe protein-calorie malnutrition: Secondary | ICD-10-CM | POA: Diagnosis not present

## 2014-04-29 DIAGNOSIS — I10 Essential (primary) hypertension: Secondary | ICD-10-CM | POA: Diagnosis not present

## 2014-04-29 DIAGNOSIS — Q7649 Other congenital malformations of spine, not associated with scoliosis: Secondary | ICD-10-CM | POA: Diagnosis not present

## 2014-04-29 DIAGNOSIS — M6282 Rhabdomyolysis: Secondary | ICD-10-CM | POA: Diagnosis not present

## 2014-04-30 DIAGNOSIS — I2510 Atherosclerotic heart disease of native coronary artery without angina pectoris: Secondary | ICD-10-CM | POA: Diagnosis not present

## 2014-04-30 DIAGNOSIS — M6282 Rhabdomyolysis: Secondary | ICD-10-CM | POA: Diagnosis not present

## 2014-04-30 DIAGNOSIS — Q7649 Other congenital malformations of spine, not associated with scoliosis: Secondary | ICD-10-CM | POA: Diagnosis not present

## 2014-04-30 DIAGNOSIS — I10 Essential (primary) hypertension: Secondary | ICD-10-CM | POA: Diagnosis not present

## 2014-04-30 DIAGNOSIS — M25569 Pain in unspecified knee: Secondary | ICD-10-CM | POA: Diagnosis not present

## 2014-04-30 DIAGNOSIS — E43 Unspecified severe protein-calorie malnutrition: Secondary | ICD-10-CM | POA: Diagnosis not present

## 2014-05-03 DIAGNOSIS — E43 Unspecified severe protein-calorie malnutrition: Secondary | ICD-10-CM | POA: Diagnosis not present

## 2014-05-03 DIAGNOSIS — Q7649 Other congenital malformations of spine, not associated with scoliosis: Secondary | ICD-10-CM | POA: Diagnosis not present

## 2014-05-03 DIAGNOSIS — M6282 Rhabdomyolysis: Secondary | ICD-10-CM | POA: Diagnosis not present

## 2014-05-03 DIAGNOSIS — I10 Essential (primary) hypertension: Secondary | ICD-10-CM | POA: Diagnosis not present

## 2014-05-03 DIAGNOSIS — M25569 Pain in unspecified knee: Secondary | ICD-10-CM | POA: Diagnosis not present

## 2014-05-03 DIAGNOSIS — I2510 Atherosclerotic heart disease of native coronary artery without angina pectoris: Secondary | ICD-10-CM | POA: Diagnosis not present

## 2014-05-04 DIAGNOSIS — R300 Dysuria: Secondary | ICD-10-CM | POA: Diagnosis not present

## 2014-05-04 DIAGNOSIS — E43 Unspecified severe protein-calorie malnutrition: Secondary | ICD-10-CM | POA: Diagnosis not present

## 2014-05-04 DIAGNOSIS — Q7649 Other congenital malformations of spine, not associated with scoliosis: Secondary | ICD-10-CM | POA: Diagnosis not present

## 2014-05-04 DIAGNOSIS — I2510 Atherosclerotic heart disease of native coronary artery without angina pectoris: Secondary | ICD-10-CM | POA: Diagnosis not present

## 2014-05-04 DIAGNOSIS — M6282 Rhabdomyolysis: Secondary | ICD-10-CM | POA: Diagnosis not present

## 2014-05-04 DIAGNOSIS — I10 Essential (primary) hypertension: Secondary | ICD-10-CM | POA: Diagnosis not present

## 2014-05-04 DIAGNOSIS — M25569 Pain in unspecified knee: Secondary | ICD-10-CM | POA: Diagnosis not present

## 2014-05-05 DIAGNOSIS — E43 Unspecified severe protein-calorie malnutrition: Secondary | ICD-10-CM | POA: Diagnosis not present

## 2014-05-05 DIAGNOSIS — M6282 Rhabdomyolysis: Secondary | ICD-10-CM | POA: Diagnosis not present

## 2014-05-05 DIAGNOSIS — M25569 Pain in unspecified knee: Secondary | ICD-10-CM | POA: Diagnosis not present

## 2014-05-05 DIAGNOSIS — Q7649 Other congenital malformations of spine, not associated with scoliosis: Secondary | ICD-10-CM | POA: Diagnosis not present

## 2014-05-05 DIAGNOSIS — I2510 Atherosclerotic heart disease of native coronary artery without angina pectoris: Secondary | ICD-10-CM | POA: Diagnosis not present

## 2014-05-05 DIAGNOSIS — I10 Essential (primary) hypertension: Secondary | ICD-10-CM | POA: Diagnosis not present

## 2014-05-06 DIAGNOSIS — I10 Essential (primary) hypertension: Secondary | ICD-10-CM | POA: Diagnosis not present

## 2014-05-06 DIAGNOSIS — Q7649 Other congenital malformations of spine, not associated with scoliosis: Secondary | ICD-10-CM | POA: Diagnosis not present

## 2014-05-06 DIAGNOSIS — M25569 Pain in unspecified knee: Secondary | ICD-10-CM | POA: Diagnosis not present

## 2014-05-06 DIAGNOSIS — I2510 Atherosclerotic heart disease of native coronary artery without angina pectoris: Secondary | ICD-10-CM | POA: Diagnosis not present

## 2014-05-06 DIAGNOSIS — E43 Unspecified severe protein-calorie malnutrition: Secondary | ICD-10-CM | POA: Diagnosis not present

## 2014-05-06 DIAGNOSIS — M6282 Rhabdomyolysis: Secondary | ICD-10-CM | POA: Diagnosis not present

## 2014-05-11 DIAGNOSIS — M6282 Rhabdomyolysis: Secondary | ICD-10-CM | POA: Diagnosis not present

## 2014-05-11 DIAGNOSIS — E43 Unspecified severe protein-calorie malnutrition: Secondary | ICD-10-CM | POA: Diagnosis not present

## 2014-05-11 DIAGNOSIS — I10 Essential (primary) hypertension: Secondary | ICD-10-CM | POA: Diagnosis not present

## 2014-05-11 DIAGNOSIS — I2510 Atherosclerotic heart disease of native coronary artery without angina pectoris: Secondary | ICD-10-CM | POA: Diagnosis not present

## 2014-05-11 DIAGNOSIS — Q7649 Other congenital malformations of spine, not associated with scoliosis: Secondary | ICD-10-CM | POA: Diagnosis not present

## 2014-05-11 DIAGNOSIS — M25569 Pain in unspecified knee: Secondary | ICD-10-CM | POA: Diagnosis not present

## 2014-05-13 DIAGNOSIS — Q7649 Other congenital malformations of spine, not associated with scoliosis: Secondary | ICD-10-CM | POA: Diagnosis not present

## 2014-05-13 DIAGNOSIS — M6282 Rhabdomyolysis: Secondary | ICD-10-CM | POA: Diagnosis not present

## 2014-05-13 DIAGNOSIS — I10 Essential (primary) hypertension: Secondary | ICD-10-CM | POA: Diagnosis not present

## 2014-05-13 DIAGNOSIS — M25569 Pain in unspecified knee: Secondary | ICD-10-CM | POA: Diagnosis not present

## 2014-05-13 DIAGNOSIS — I2510 Atherosclerotic heart disease of native coronary artery without angina pectoris: Secondary | ICD-10-CM | POA: Diagnosis not present

## 2014-05-13 DIAGNOSIS — E43 Unspecified severe protein-calorie malnutrition: Secondary | ICD-10-CM | POA: Diagnosis not present

## 2014-06-15 ENCOUNTER — Ambulatory Visit (INDEPENDENT_AMBULATORY_CARE_PROVIDER_SITE_OTHER): Payer: Medicare Other

## 2014-06-15 VITALS — BP 162/89 | HR 57 | Resp 17 | Ht 67.0 in | Wt 149.8 lb

## 2014-06-15 DIAGNOSIS — I2510 Atherosclerotic heart disease of native coronary artery without angina pectoris: Secondary | ICD-10-CM | POA: Diagnosis not present

## 2014-06-15 DIAGNOSIS — M19079 Primary osteoarthritis, unspecified ankle and foot: Secondary | ICD-10-CM

## 2014-06-15 DIAGNOSIS — B353 Tinea pedis: Secondary | ICD-10-CM

## 2014-06-15 DIAGNOSIS — M7750 Other enthesopathy of unspecified foot: Secondary | ICD-10-CM

## 2014-06-15 DIAGNOSIS — M2040 Other hammer toe(s) (acquired), unspecified foot: Secondary | ICD-10-CM | POA: Diagnosis not present

## 2014-06-15 DIAGNOSIS — M79609 Pain in unspecified limb: Secondary | ICD-10-CM | POA: Diagnosis not present

## 2014-06-15 DIAGNOSIS — M79674 Pain in right toe(s): Secondary | ICD-10-CM

## 2014-06-15 DIAGNOSIS — M778 Other enthesopathies, not elsewhere classified: Secondary | ICD-10-CM

## 2014-06-15 DIAGNOSIS — M779 Enthesopathy, unspecified: Secondary | ICD-10-CM

## 2014-06-15 MED ORDER — NAFTIFINE HCL 2 % EX CREA: TOPICAL_CREAM | CUTANEOUS | Status: DC

## 2014-06-15 NOTE — Patient Instructions (Signed)
Osteoarthritis Osteoarthritis is a disease that causes soreness and swelling (inflammation) of a joint. It occurs when the cartilage at the affected joint wears down. Cartilage acts as a cushion, covering the ends of bones where they meet to form a joint. Osteoarthritis is the most common form of arthritis. It often occurs in older people. The joints affected most often by this condition include those in the:  Ends of the fingers.  Thumbs.  Neck.  Lower back.  Knees.  Hips. CAUSES  Over time, the cartilage that covers the ends of bones begins to wear away. This causes bone to rub on bone, producing pain and stiffness in the affected joints.  RISK FACTORS Certain factors can increase your chances of having osteoarthritis, including:  Older age.  Excessive body weight.  Overuse of joints. SIGNS AND SYMPTOMS   Pain, swelling, and stiffness in the joint.  Over time, the joint may lose its normal shape.  Small deposits of bone (osteophytes) may grow on the edges of the joint.  Bits of bone or cartilage can break off and float inside the joint space. This may cause more pain and damage. DIAGNOSIS  Your health care provider will do a physical exam and ask about your symptoms. Various tests may be ordered, such as:  X-rays of the affected joint.  An MRI scan.  Blood tests to rule out other types of arthritis.  Joint fluid tests. This involves using a needle to draw fluid from the joint and examining the fluid under a microscope. TREATMENT  Goals of treatment are to control pain and improve joint function. Treatment plans may include:  A prescribed exercise program that allows for rest and joint relief.  A weight control plan.  Pain relief techniques, such as:  Properly applied heat and cold.  Electric pulses delivered to nerve endings under the skin (transcutaneous electrical nerve stimulation, TENS).  Massage.  Certain nutritional supplements.  Medicines to  control pain, such as:  Acetaminophen.  Nonsteroidal anti-inflammatory drugs (NSAIDs), such as naproxen.  Narcotic or central-acting agents, such as tramadol.  Corticosteroids. These can be given orally or as an injection.  Surgery to reposition the bones and relieve pain (osteotomy) or to remove loose pieces of bone and cartilage. Joint replacement may be needed in advanced states of osteoarthritis. HOME CARE INSTRUCTIONS   Only take over-the-counter or prescription medicines as directed by your health care provider. Take all medicines exactly as instructed.  Maintain a healthy weight. Follow your health care provider's instructions for weight control. This may include dietary instructions.  Exercise as directed. Your health care provider can recommend specific types of exercise. These may include:  Strengthening exercises--These are done to strengthen the muscles that support joints affected by arthritis. They can be performed with weights or with exercise bands to add resistance.  Aerobic activities--These are exercises, such as brisk walking or low-impact aerobics, that get your heart pumping.  Range-of-motion activities--These keep your joints limber.  Balance and agility exercises--These help you maintain daily living skills.  Rest your affected joints as directed by your health care provider.  Follow up with your health care provider as directed. SEEK MEDICAL CARE IF:   Your skin turns red.  You develop a rash in addition to your joint pain.  You have worsening joint pain. SEEK IMMEDIATE MEDICAL CARE IF:  You have a significant loss of weight or appetite.  You have a fever along with joint or muscle aches.  You have night sweats. FOR MORE   INFORMATION  National Institute of Arthritis and Musculoskeletal and Skin Diseases: www.niams.nih.gov National Institute on Aging: www.nia.nih.gov American College of Rheumatology: www.rheumatology.org Document Released:  11/19/2005 Document Revised: 09/09/2013 Document Reviewed: 07/27/2013 Exit[employer] Patient Information 2015 [employer]. This information is not intended to replace advice given to you by your health care provider. Make sure you discuss any questions you have with your health care provider.  

## 2014-06-15 NOTE — Progress Notes (Signed)
   Subjective:    Patient ID: Wayne [name], male    DOB: 14-Jun-1926, 80 y.o.   MRN: [id number]  HPI Comments: N toe pain L right 3rd toe D over 6 months O  C burning sensation A walking in shoes T pt states he has trimmed the hard skin off the tip, padding off and moleskin  Toe Pain       Review of Systems  All other systems reviewed and are negative.      Objective:   Physical Exam Lower extremity objective findings as follows vascular status is diminished with pedal pulses are thready DP and PT plus one over 4 bilateral Refill time 3 seconds all digits has severe promontory changes with collapse of the foot arthrosis of Lisfranc joint with history of arthropathy prominence of the first met cuneiform articulation plantar medial. History of previous ulcerations and complications. Does have beginning or early onset diabetes with possible neuropathy as well for the last several months possibly 6 months worse and increased pain of the second third MTP joints right foot however has restricted range of motion for flexion plantar flexion those joints clinically radiographically asymmetric joint space very radius slight osteophyte cannot rule out possibly early radial stress fracture of the third metatarsal on AP and oblique views. Patient does have some fissuring and maceration which is treated with limb of Naftin cream in the past a prescription for Naftin is sent to on Pharmacy for him to be applying for 2-3 duration to resolve interdigital maceration. Patient has generalized posture arthropathy the of the MTP joints and Lisfranc joint bilateral right more so than left.       Assessment & Plan:  Assessment osteoarthritis was mild tinea pedis and capsulitis a Lisfranc's and forefoot with possibly early neuroma symptomology right forefoot. Plan at this time patient placed in some Plastizote accommodative orthoses his orthotics are hard and nonpitting and not accompanied to his foot  deformity diabetic type insoles are dispensed and first the patient free of charge at this time his he does not have coverage for orthoses we'll try these as a trial if successful distal adjustments may be carried out at future prescription for Naftin cream forwarded to on line Pharmacy schedule recheck followup visit within 1-2 months as needed next  Harriet Masson DPM

## 2014-07-14 DIAGNOSIS — H40019 Open angle with borderline findings, low risk, unspecified eye: Secondary | ICD-10-CM | POA: Diagnosis not present

## 2014-07-14 DIAGNOSIS — Z961 Presence of intraocular lens: Secondary | ICD-10-CM | POA: Diagnosis not present

## 2014-07-14 DIAGNOSIS — H2589 Other age-related cataract: Secondary | ICD-10-CM | POA: Diagnosis not present

## 2014-07-14 DIAGNOSIS — H02409 Unspecified ptosis of unspecified eyelid: Secondary | ICD-10-CM | POA: Diagnosis not present

## 2014-07-26 DIAGNOSIS — Z79899 Other long term (current) drug therapy: Secondary | ICD-10-CM | POA: Diagnosis not present

## 2014-07-26 DIAGNOSIS — N183 Chronic kidney disease, stage 3 unspecified: Secondary | ICD-10-CM | POA: Diagnosis not present

## 2014-07-26 DIAGNOSIS — Z9181 History of falling: Secondary | ICD-10-CM | POA: Diagnosis not present

## 2014-07-26 DIAGNOSIS — E860 Dehydration: Secondary | ICD-10-CM | POA: Diagnosis not present

## 2014-07-26 DIAGNOSIS — I129 Hypertensive chronic kidney disease with stage 1 through stage 4 chronic kidney disease, or unspecified chronic kidney disease: Secondary | ICD-10-CM | POA: Diagnosis not present

## 2014-08-10 ENCOUNTER — Ambulatory Visit (INDEPENDENT_AMBULATORY_CARE_PROVIDER_SITE_OTHER): Payer: Medicare Other

## 2014-08-10 VITALS — BP 166/84 | HR 84 | Resp 12

## 2014-08-10 DIAGNOSIS — E1140 Type 2 diabetes mellitus with diabetic neuropathy, unspecified: Secondary | ICD-10-CM

## 2014-08-10 DIAGNOSIS — Q828 Other specified congenital malformations of skin: Secondary | ICD-10-CM | POA: Diagnosis not present

## 2014-08-10 DIAGNOSIS — M19079 Primary osteoarthritis, unspecified ankle and foot: Secondary | ICD-10-CM

## 2014-08-10 DIAGNOSIS — I2510 Atherosclerotic heart disease of native coronary artery without angina pectoris: Secondary | ICD-10-CM

## 2014-08-10 DIAGNOSIS — E1149 Type 2 diabetes mellitus with other diabetic neurological complication: Secondary | ICD-10-CM | POA: Diagnosis not present

## 2014-08-10 DIAGNOSIS — M2040 Other hammer toe(s) (acquired), unspecified foot: Secondary | ICD-10-CM | POA: Diagnosis not present

## 2014-08-10 DIAGNOSIS — E1142 Type 2 diabetes mellitus with diabetic polyneuropathy: Secondary | ICD-10-CM

## 2014-08-10 NOTE — Patient Instructions (Signed)
Corns and Calluses Corns are small areas of thickened skin that usually occur on the top, sides, or tip of a toe. They contain a cone-shaped core with a point that can press on a nerve below. This causes pain. Calluses are areas of thickened skin that usually develop on hands, fingers, palms, soles of the feet, and heels. These are areas that experience frequent friction or pressure. CAUSES  Corns are usually the result of rubbing (friction) or pressure from shoes that are too tight or do not fit properly. Calluses are caused by repeated friction and pressure on the affected areas. SYMPTOMS  A hard growth on the skin.  Pain or tenderness under the skin.  Sometimes, redness and swelling.  Increased discomfort while wearing tight-fitting shoes. DIAGNOSIS  Your caregiver can usually tell what the problem is by doing a physical exam. TREATMENT  Removing the cause of the friction or pressure is usually the only treatment needed. However, sometimes medicines can be used to help soften the hardened, thickened areas. These medicines include salicylic acid plasters and 12% ammonium lactate lotion. These medicines should only be used under the direction of your caregiver. HOME CARE INSTRUCTIONS   Try to remove pressure from the affected area.  You may wear donut-shaped corn pads to protect your skin.  You may use a pumice stone or nonmetallic nail file to gently reduce the thickness of a corn.  Wear properly fitted footwear.  If you have calluses on the hands, wear gloves during activities that cause friction.  If you have diabetes, you should regularly examine your feet. Tell your caregiver if you notice any problems with your feet. SEEK IMMEDIATE MEDICAL CARE IF:   You have increased pain, swelling, redness, or warmth in the affected area.  Your corn or callus starts to drain fluid or bleeds.  You are not getting better, even with treatment. Document Released: 08/25/2004 Document  Revised: 02/11/2012 Document Reviewed: 07/17/2011 Exit[employer] Patient Information 2015 [employer]. This information is not intended to replace advice given to you by your health care provider. Make sure you discuss any questions you have with your health care provider.  

## 2014-08-10 NOTE — Progress Notes (Signed)
   Subjective:    Patient ID: Wayne [name], male    DOB: Apr 17, 1926, 80 y.o.   MRN: [id number]  HPI  ''BOTH LITTLE TOES STILL SORE AND BEEN USING THE NAFTIN CREAM BUT IS NOT WORKING.''  Review of Systems no new findings or systemic changes noted     Objective:   Physical Exam Neurovascular status is intact and unchanged pedal pulses DP plus one over 4 PT +2/4 bilateral capillary refill time 3 seconds epicritic and proprioceptive sensations intact and symmetric bilateral is normal plantar response DTRs not elicited dermatologically skin color pigment normal there is keratoses medial navicular area both arches due to severe pedis planus or pronation of the foot there is abduction of the forefoot with valgus rotation. Patient does have keratoses interdigitally fifth digits bilateral between the fourth and fifth toes due to unrelenting fifth toe hammertoe deformities. With associated soft corns or keratoses right more so than left. Both fifth toes are painful symptomatically shoes activities. Patient does have an abducted forefoot was wearing a curved last her in flare last type shoe which is exacerbating his condition. Remainder of exam unremarkable noncontributory       Assessment & Plan:  Assessment this time is hammertoe deformity with individual keratoses are soft 24th and fifth digits bilateral left more painful right has more keratoses. Discussed options this time keratoses debridement fifth digit right 2 foam padding applied to both fifth toes and patient given option for conservative care with padding cushion and looking for straight lash to versus a curved shape. The other option the patient is also interested in a more definitive or permanent correction would be surgical intervention with possible hammertoe with Flagyl head resection fifth digit and partial webbing or syndactyly of the fourth and fifth digit with excision of interdigital keratoses. Patient mentioned surgery will discuss  this and consider sometime in the near future possible surgical consultation for palliative care is needed to be no contraindications to surgery patient appears to be neurovascularly confident for surgery at this time likely can do both fifth digits bilateral with partial webbing or syndactyly procedures. This will be done an outpatient or day surgery local anesthetic and rate IV sedation. Patient will follow up when ready next progress Harriet Masson DPM

## 2014-08-12 ENCOUNTER — Ambulatory Visit
Admission: RE | Admit: 2014-08-12 | Discharge: 2014-08-12 | Disposition: A | Discharge status: Home / Self Care | Payer: Medicare Other | Source: Ambulatory Visit | Attending: Family | Admitting: Family

## 2014-08-12 ENCOUNTER — Other Ambulatory Visit: Payer: Self-pay | Admitting: Family

## 2014-08-12 DIAGNOSIS — R229 Localized swelling, mass and lump, unspecified: Secondary | ICD-10-CM

## 2014-08-12 DIAGNOSIS — R609 Edema, unspecified: Secondary | ICD-10-CM

## 2014-08-12 DIAGNOSIS — R091 Pleurisy: Secondary | ICD-10-CM | POA: Diagnosis not present

## 2014-08-12 DIAGNOSIS — Z79899 Other long term (current) drug therapy: Secondary | ICD-10-CM | POA: Diagnosis not present

## 2014-08-12 DIAGNOSIS — R222 Localized swelling, mass and lump, trunk: Secondary | ICD-10-CM | POA: Diagnosis not present

## 2014-08-12 DIAGNOSIS — IMO0002 Reserved for concepts with insufficient information to code with codable children: Secondary | ICD-10-CM

## 2014-08-18 DIAGNOSIS — N139 Obstructive and reflux uropathy, unspecified: Secondary | ICD-10-CM | POA: Diagnosis not present

## 2014-08-18 DIAGNOSIS — N138 Other obstructive and reflux uropathy: Secondary | ICD-10-CM | POA: Diagnosis not present

## 2014-08-18 DIAGNOSIS — N401 Enlarged prostate with lower urinary tract symptoms: Secondary | ICD-10-CM | POA: Diagnosis not present

## 2014-08-27 DIAGNOSIS — R222 Localized swelling, mass and lump, trunk: Secondary | ICD-10-CM | POA: Diagnosis not present

## 2014-08-27 DIAGNOSIS — Z79899 Other long term (current) drug therapy: Secondary | ICD-10-CM | POA: Diagnosis not present

## 2014-08-27 DIAGNOSIS — M545 Low back pain, unspecified: Secondary | ICD-10-CM | POA: Diagnosis not present

## 2014-09-06 DIAGNOSIS — Z23 Encounter for immunization: Secondary | ICD-10-CM | POA: Diagnosis not present

## 2014-09-29 DIAGNOSIS — Z96652 Presence of left artificial knee joint: Secondary | ICD-10-CM | POA: Diagnosis not present

## 2014-09-29 DIAGNOSIS — M1711 Unilateral primary osteoarthritis, right knee: Secondary | ICD-10-CM | POA: Diagnosis not present

## 2014-10-18 DIAGNOSIS — Z79899 Other long term (current) drug therapy: Secondary | ICD-10-CM | POA: Diagnosis not present

## 2014-10-18 DIAGNOSIS — I1310 Hypertensive heart and chronic kidney disease without heart failure, with stage 1 through stage 4 chronic kidney disease, or unspecified chronic kidney disease: Secondary | ICD-10-CM | POA: Diagnosis not present

## 2014-10-18 DIAGNOSIS — N183 Chronic kidney disease, stage 3 (moderate): Secondary | ICD-10-CM | POA: Diagnosis not present

## 2014-11-05 DIAGNOSIS — M1711 Unilateral primary osteoarthritis, right knee: Secondary | ICD-10-CM | POA: Diagnosis not present

## 2014-11-11 ENCOUNTER — Encounter (HOSPITAL_COMMUNITY): Payer: Self-pay | Admitting: Cardiology

## 2014-11-23 ENCOUNTER — Ambulatory Visit (INDEPENDENT_AMBULATORY_CARE_PROVIDER_SITE_OTHER): Payer: Medicare Other

## 2014-11-23 VITALS — BP 143/83 | HR 70 | Resp 15

## 2014-11-23 DIAGNOSIS — E1140 Type 2 diabetes mellitus with diabetic neuropathy, unspecified: Secondary | ICD-10-CM | POA: Diagnosis not present

## 2014-11-23 DIAGNOSIS — M2040 Other hammer toe(s) (acquired), unspecified foot: Secondary | ICD-10-CM | POA: Diagnosis not present

## 2014-11-23 DIAGNOSIS — M799 Soft tissue disorder, unspecified: Secondary | ICD-10-CM

## 2014-11-23 DIAGNOSIS — I2510 Atherosclerotic heart disease of native coronary artery without angina pectoris: Secondary | ICD-10-CM | POA: Diagnosis not present

## 2014-11-23 DIAGNOSIS — M19079 Primary osteoarthritis, unspecified ankle and foot: Secondary | ICD-10-CM

## 2014-11-23 DIAGNOSIS — Q828 Other specified congenital malformations of skin: Secondary | ICD-10-CM

## 2014-11-23 NOTE — Progress Notes (Signed)
   Subjective:    Patient ID: Wayne [name], male    DOB: 02/02/1926, 80 y.o.   MRN: [id number]  HPI Comments: Pt states he would like to set up surgery with Dr. Blenda Mounts on both little toes.     Review of Systems no new findings or systemic changes noted     Objective:   Physical Exam Vascular status appears to be intact DP plus one over 4 PT +2 over 4 bilateral Refill timed 3-4 seconds to the prone 3 changes of both feet patient does have hammertoe deformities fifth bilateral with some interdigital keratoses and lesion of fourth webspace left as well as a dorsal keratoses fifth toe right foot. Due to hammertoe deformity bilateral patient is a candidate for surgical intervention previously discussed this again neurovascular status intact pedal pulses are palpable epicritic and proprioceptive sensations intact per patient request my recommendation is time will schedule surgery and discussed options. Remainder the exam unremarkable x-rays previously taken are reviewed at this time likely patient is rigid contractures of toes bilateral no soft tissue keratoses fourth web is a medial keratoses fifth toe right which should also resolve with removal of the proximal phalanx head.       Assessment & Plan:  Assessment this time hammertoe deformity as well as soft tissue lesion and the corn fourth webspace left foot and plan at this time patient is a candidate for surgical intervention consent form for partial excision of skin lesion fourth webspace left foot and partial syndactyly or webbing procedure as well as hammertoe repair with Flagyl head resection fifth toes bilateral discussed the risks, case alternatives consent form for procedures was reviewed and signed patient will be in a Darco shoe for approximately a 1 month duration will be ambulatory with moderate activities. Surgery scheduled consent form reviewed and signed and will follow-up appropriately 1 surgery completed the [facility]  office  Harriet Masson DPM

## 2014-11-23 NOTE — Patient Instructions (Signed)
Pre-Operative Instructions  Congratulations, you have decided to take an important step to improving your quality of life.  You can be assured that the doctors of [facility] will be with you every step of the way.  1. Plan to be at the surgery center/hospital at least 1 (one) hour prior to your scheduled time unless otherwise directed by the surgical center/hospital staff.  You must have a responsible adult accompany you, remain during the surgery and drive you home.  Make sure you have directions to the surgical center/hospital and know how to get there on time. 2. For hospital based surgery you will need to obtain a history and physical form from your family physician within 1 month prior to the date of surgery- we will give you a form for you primary physician.  3. We make every effort to accommodate the date you request for surgery.  There are however, times where surgery dates or times have to be moved.  We will contact you as soon as possible if a change in schedule is required.   4. No Aspirin/Ibuprofen for one week before surgery.  If you are on aspirin, any non-steroidal anti-inflammatory medications (Mobic, Aleve, Ibuprofen) you should stop taking it 7 days prior to your surgery.  You make take Tylenol  For pain prior to surgery.  5. Medications- If you are taking daily heart and blood pressure medications, seizure, reflux, allergy, asthma, anxiety, pain or diabetes medications, make sure the surgery center/hospital is aware before the day of surgery so they may notify you which medications to take or avoid the day of surgery. 6. No food or drink after midnight the night before surgery unless directed otherwise by surgical center/hospital staff. 7. No alcoholic beverages 24 hours prior to surgery.  No smoking 24 hours prior to or 24 hours after surgery. 8. Wear loose pants or shorts- loose enough to fit over bandages, boots, and casts. 9. No slip on shoes, sneakers are best. 10. Bring  your boot with you to the surgery center/hospital.  Also bring crutches or a walker if your physician has prescribed it for you.  If you do not have this equipment, it will be provided for you after surgery. 11. If you have not been contracted by the surgery center/hospital by the day before your surgery, call to confirm the date and time of your surgery. 12. Leave-time from work may vary depending on the type of surgery you have.  Appropriate arrangements should be made prior to surgery with your employer. 13. Prescriptions will be provided immediately following surgery by your doctor.  Have these filled as soon as possible after surgery and take the medication as directed. 14. Remove nail polish on the operative foot. 15. Wash the night before surgery.  The night before surgery wash the foot and leg well with the antibacterial soap provided and water paying special attention to beneath the toenails and in between the toes.  Rinse thoroughly with water and dry well with a towel.  Perform this wash unless told not to do so by your physician.  Enclosed: 1 Ice pack (please put in freezer the night before surgery)   1 Hibiclens skin cleaner   Pre-op Instructions  If you have any questions regarding the instructions, do not hesitate to call our office.  [place]: [address] [place], [place] [address] [phone]  [place]: [address] [place], [place] [address] [phone]  [place]: [address]  [place], [place] [address] [phone]  Dr. Brydan Downard   Tuchman DPM, Dr. Norman Regal DPM Dr. Lulubelle Simcoe DPM, Dr. M. Todd Hyatt DPM, Dr. Kathryn Egerton DPM 

## 2014-12-29 DIAGNOSIS — M2040 Other hammer toe(s) (acquired), unspecified foot: Secondary | ICD-10-CM | POA: Diagnosis not present

## 2014-12-29 DIAGNOSIS — I10 Essential (primary) hypertension: Secondary | ICD-10-CM | POA: Diagnosis not present

## 2014-12-29 DIAGNOSIS — M2042 Other hammer toe(s) (acquired), left foot: Secondary | ICD-10-CM | POA: Diagnosis not present

## 2014-12-29 DIAGNOSIS — L852 Keratosis punctata (palmaris et plantaris): Secondary | ICD-10-CM | POA: Diagnosis not present

## 2014-12-29 DIAGNOSIS — L570 Actinic keratosis: Secondary | ICD-10-CM | POA: Diagnosis not present

## 2014-12-29 DIAGNOSIS — L859 Epidermal thickening, unspecified: Secondary | ICD-10-CM | POA: Diagnosis not present

## 2014-12-29 DIAGNOSIS — M2041 Other hammer toe(s) (acquired), right foot: Secondary | ICD-10-CM | POA: Diagnosis not present

## 2015-01-04 ENCOUNTER — Ambulatory Visit (INDEPENDENT_AMBULATORY_CARE_PROVIDER_SITE_OTHER): Payer: Medicare Other

## 2015-01-04 ENCOUNTER — Ambulatory Visit: Payer: Medicare Other

## 2015-01-04 VITALS — BP 167/86 | HR 72 | Resp 12

## 2015-01-04 DIAGNOSIS — M2042 Other hammer toe(s) (acquired), left foot: Secondary | ICD-10-CM

## 2015-01-04 DIAGNOSIS — M2040 Other hammer toe(s) (acquired), unspecified foot: Secondary | ICD-10-CM

## 2015-01-04 DIAGNOSIS — Z09 Encounter for follow-up examination after completed treatment for conditions other than malignant neoplasm: Secondary | ICD-10-CM

## 2015-01-04 NOTE — Patient Instructions (Signed)
ICE INSTRUCTIONS  Apply ice or cold pack to the affected area at least 3 times a day for 10-15 minutes each time.  You should also use ice after prolonged activity or vigorous exercise.  Do not apply ice longer than 20 minutes at one time.  Always keep a cloth between your skin and the ice pack to prevent burns.  Being consistent and following these instructions will help control your symptoms.  We suggest you purchase a gel ice pack because they are reusable and do bit leak.  Some of them are designed to wrap around the area.  Use the method that works best for you.  Here are some other suggestions for icing.   Use a frozen bag of peas or corn-inexpensive and molds well to your body, usually stays frozen for 10 to 20 minutes.  Wet a towel with cold water and squeeze out the excess until it's damp.  Place in a bag in the freezer for 20 minutes. Then remove and use.  Maintain Darco shoes at all times including while sleeping. Elevate and ice whenever possible to keep them on the pain and swelling

## 2015-01-04 NOTE — Progress Notes (Signed)
   Subjective:    Patient ID: Wayne [name], male    DOB: 06-14-1926, 81 y.o.   MRN: [id number]  HPI  DOS 12/29/2014 LT FOOT HAMMER TOE B/L 5TH TOE,  WEBBING 4TH .''b/l toes are doing ok.''  Review of Systems no new findings or systemic changes noted     Objective:   Physical Exam Patient presents at this time 6 day status post hammertoe repair fifth bilateral and partial syndactyly or webbing fourth webspace left. Incisions clean dry well coapted dressings intact and dry. At this time dry sterile compressive dressing is reapplied maintain for 1 week reported for possible suture removal on right foot in one week possible suture removal left foot in 2 weeks. Patient is advised to maintain Darco shoe at all times no dehiscence no discharge drainage no signs of infection patient did did very well head pain the first 2 days but at this time has had minimal reduce pain or discomfort slight aching and throbbing at times especially with prolonged standing or walking.       Assessment & Plan:  Assessment good postop progress no signs of dehiscence no infection no discharge or drainage wound appears to be well coapted with sutures intact. At this time presto compressive dressing reapplied reappoint one week for suture removal on right foot likely suture removal on left foot in 2 weeks. Recommended Tylenol or Advil as needed for pain at this point maintain Darco shoes as instructed  Harriet Masson DPM

## 2015-01-11 ENCOUNTER — Ambulatory Visit (INDEPENDENT_AMBULATORY_CARE_PROVIDER_SITE_OTHER): Payer: Medicare Other

## 2015-01-11 VITALS — BP 121/70 | HR 74 | Resp 12

## 2015-01-11 DIAGNOSIS — Z09 Encounter for follow-up examination after completed treatment for conditions other than malignant neoplasm: Secondary | ICD-10-CM

## 2015-01-11 DIAGNOSIS — M799 Soft tissue disorder, unspecified: Secondary | ICD-10-CM

## 2015-01-11 DIAGNOSIS — M2042 Other hammer toe(s) (acquired), left foot: Secondary | ICD-10-CM

## 2015-01-11 DIAGNOSIS — Q828 Other specified congenital malformations of skin: Secondary | ICD-10-CM

## 2015-01-11 NOTE — Patient Instructions (Signed)
On right foot may resume normal bathing and washing. Maintain Coflex wrap of toes buddy wrapping fourth and fifth toes together as instructed.  On left foot leave dressing clean and dry follow-up in one week for suture removal on left foot. Next para Graff on both feet maintain surgical shoes at all times for walking or standing or ambulating.

## 2015-01-11 NOTE — Progress Notes (Signed)
   Subjective:    Patient ID: Wayne [name], male    DOB: Jun 17, 1926, 81 y.o.   MRN: [id number]  HPI  DOS 12/29/2014 LT FOOT HAMMER TOE B/L 5TH TOE,  WEBBING 4TH . ''b/l toes are doing ok but a little tender.''  Review of Systems no new findings or systemic changes noted    Objective:   Physical Exam Neurovascular status is intact patient is about 13 days status post hammertoe repairs fifth bilateral with syndactyly fourth and fifth left. Sutures removed on the fifth toe right foot and Coflex wrap been buddy wrap is applied to the fourth and fifth toes on the right foot. May resume normal bathing and hygiene on right foot. Maintain Darco shoe. On left foot presto compressive dressing reapplied reappoint one week plan for suture removal in one week. He appears to be well coapted however interspace lesions are wounds take a little longer to heal due to maceration of the tissue want to avoid any dehiscence recheck in one week for suture removal       Assessment & Plan:  Assessment good postop progress 2 weeks status post hammertoe repair and partial syndactyly fourth and fifth toes left foot. We'll follow-up in the next week for suture removal on the fifth toe left foot maintain Darco shoes as instructed moderate walking and can resume normal but driving with Darco shoe in place however to be careful with driving her activities no ballistic or running activities maintain surgical shoes for at least another 3 or 4 weeks. Next  Harriet Masson DPM

## 2015-01-12 ENCOUNTER — Telehealth: Payer: Self-pay | Admitting: *Deleted

## 2015-01-12 NOTE — Telephone Encounter (Signed)
Pt states he had surgery the 12/29/2014, and wanted to know if he needed to sleep in the boots.  I told him know but he still needed to walk in the the surgical shoes.

## 2015-01-17 NOTE — Progress Notes (Signed)
DOS 12/29/2014 B/L 5th hammer toe repair, and Left 4th Webbing procedure.

## 2015-01-18 ENCOUNTER — Ambulatory Visit (INDEPENDENT_AMBULATORY_CARE_PROVIDER_SITE_OTHER): Payer: Medicare Other

## 2015-01-18 VITALS — BP 136/76 | HR 64 | Resp 12

## 2015-01-18 DIAGNOSIS — M799 Soft tissue disorder, unspecified: Secondary | ICD-10-CM

## 2015-01-18 DIAGNOSIS — M2040 Other hammer toe(s) (acquired), unspecified foot: Secondary | ICD-10-CM | POA: Diagnosis not present

## 2015-01-18 DIAGNOSIS — Z09 Encounter for follow-up examination after completed treatment for conditions other than malignant neoplasm: Secondary | ICD-10-CM

## 2015-01-18 DIAGNOSIS — M2042 Other hammer toe(s) (acquired), left foot: Secondary | ICD-10-CM

## 2015-01-18 NOTE — Progress Notes (Signed)
   Subjective:    Patient ID: Wayne [name], male    DOB: 21-Mar-1926, 81 y.o.   MRN: [id number]  HPI  DOS 12/29/2014 B/L 5th hammer toe repair, and Left 4th Webbing procedure.  ''b/l 5th toes are doing ok but still a little sore.''  Review of Systems no new findings or systemic changes     Objective:   Physical Exam Neurovascular status is intact pedal pulses are palpable patient is 20 days status post hammertoe repair fifth bilateral and syndactyly fourthleft. The sutures on the fourthleft are removed at this time after treatment dressing with Xylocaine gel and Betadine or wound light gauze wrap is applied to the interspace will maintain by the patient. May resume normal bathing and hygiene. Neurovascular status intact and unchanged no dehiscence no discharge no drainage no signs of infection noted. Minimal pain or discomfort during postop treatment.       Assessment & Plan:  Assessment good postop progress following partial syndactyly fourth and fifth toes left foot with hammertoe repair fifth and bilateral. Patient is advised may resume bathing and hygiene wash with soap and water dry thoroughly apply gauze dressing to the fourth webspace left as instructed reappointed 4 weeks for long-term postop follow-up with x-ray 2 weeks from now May discontinue Darco shoe and return to walking or tennis or athletic shoes as tolerated  Harriet Masson DPM

## 2015-01-18 NOTE — Patient Instructions (Signed)
ICE INSTRUCTIONS  Apply ice or cold pack to the affected area at least 3 times a day for 10-15 minutes each time.  You should also use ice after prolonged activity or vigorous exercise.  Do not apply ice longer than 20 minutes at one time.  Always keep a cloth between your skin and the ice pack to prevent burns.  Being consistent and following these instructions will help control your symptoms.  We suggest you purchase a gel ice pack because they are reusable and do bit leak.  Some of them are designed to wrap around the area.  Use the method that works best for you.  Here are some other suggestions for icing.   Use a frozen bag of peas or corn-inexpensive and molds well to your body, usually stays frozen for 10 to 20 minutes.  Wet a towel with cold water and squeeze out the excess until it's damp.  Place in a bag in the freezer for 20 minutes. Then remove and use.  May resume normal bathing or showering or washing of toes and feet with soap and water dry thoroughly especially between the toes. Apply light gauze between the fourth and fifth toes left foot and secure with paper tape as instructed. Next  Maintain blue Coflex wrapping of fifth toe right foot. Next  Maintain surgical shoe for 2 more weeks then resume a good walking tennis or athletic shoe as tolerated.

## 2015-01-25 ENCOUNTER — Telehealth: Payer: Self-pay | Admitting: *Deleted

## 2015-01-25 NOTE — Telephone Encounter (Signed)
Pt states he had his sutures removed from his left foot 12/29/2014, and now both feet are swollen, even after icing 3 - 4 times a day.  I informed pt, that often after surgery he may have swelling on and off to varying degrees, that to periodically through the day rest and elevate his feet, especially at time he notices the swelling.  I encouraged pt to call for an appt if increased pain, redness or drainage.  Pt agreed.

## 2015-02-15 ENCOUNTER — Ambulatory Visit: Payer: Medicare Other

## 2015-02-15 ENCOUNTER — Other Ambulatory Visit: Payer: Self-pay

## 2015-02-15 ENCOUNTER — Ambulatory Visit (INDEPENDENT_AMBULATORY_CARE_PROVIDER_SITE_OTHER): Payer: Medicare Other

## 2015-02-15 VITALS — BP 153/79 | HR 66 | Resp 12

## 2015-02-15 DIAGNOSIS — M2040 Other hammer toe(s) (acquired), unspecified foot: Secondary | ICD-10-CM

## 2015-02-15 DIAGNOSIS — M799 Soft tissue disorder, unspecified: Secondary | ICD-10-CM

## 2015-02-15 DIAGNOSIS — Z09 Encounter for follow-up examination after completed treatment for conditions other than malignant neoplasm: Secondary | ICD-10-CM

## 2015-02-15 NOTE — Progress Notes (Signed)
   Subjective:    Patient ID: Wayne [name], male    DOB: [birth year], 81 y.o.   MRN: [id number]  HPI  DOS 12/29/2014 B/L 5th hammer toe repair, and Left 4th Webbing procedure.''B/L 5TH LOOKS A LOT BETTER BUT STILL GET SORE ESPECIALLY THE ONE ON THE LT FOOT.''  Review of Systems no new findings or systemic changes noted     Objective:   Physical Exam Neurovascular status is intact and unchanged incision is well coapted still having some tenderness more on the left interspace area still some swelling in both fifth toes consistent with postop course just over 2 months postop. 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 Active infection is no discharge or drainage noted incisions well coapted at this time.       Assessment & Plan:  Assessment good postop progress patient does have hammertoe repair fifth bilateral syndactyly fourth webspace left no dehiscence no discharge no drainage no signs of infection however there is no postoperative edema as well as mild arthropathy recheck in 3 months for long-term postop follow-up contact us any changes or exacerbations occur at any time  Harriet Masson DPM

## 2015-02-16 DIAGNOSIS — Z79899 Other long term (current) drug therapy: Secondary | ICD-10-CM | POA: Diagnosis not present

## 2015-02-16 DIAGNOSIS — N183 Chronic kidney disease, stage 3 (moderate): Secondary | ICD-10-CM | POA: Diagnosis not present

## 2015-02-16 DIAGNOSIS — I1310 Hypertensive heart and chronic kidney disease without heart failure, with stage 1 through stage 4 chronic kidney disease, or unspecified chronic kidney disease: Secondary | ICD-10-CM | POA: Diagnosis not present

## 2015-02-22 DIAGNOSIS — H25811 Combined forms of age-related cataract, right eye: Secondary | ICD-10-CM | POA: Diagnosis not present

## 2015-02-22 DIAGNOSIS — H10413 Chronic giant papillary conjunctivitis, bilateral: Secondary | ICD-10-CM | POA: Diagnosis not present

## 2015-02-22 DIAGNOSIS — H40023 Open angle with borderline findings, high risk, bilateral: Secondary | ICD-10-CM | POA: Diagnosis not present

## 2015-02-22 DIAGNOSIS — H04123 Dry eye syndrome of bilateral lacrimal glands: Secondary | ICD-10-CM | POA: Diagnosis not present

## 2015-02-22 DIAGNOSIS — Z961 Presence of intraocular lens: Secondary | ICD-10-CM | POA: Diagnosis not present

## 2015-02-22 DIAGNOSIS — H02403 Unspecified ptosis of bilateral eyelids: Secondary | ICD-10-CM | POA: Diagnosis not present

## 2015-04-04 DIAGNOSIS — M1711 Unilateral primary osteoarthritis, right knee: Secondary | ICD-10-CM | POA: Diagnosis not present

## 2015-04-29 DIAGNOSIS — I2510 Atherosclerotic heart disease of native coronary artery without angina pectoris: Secondary | ICD-10-CM | POA: Diagnosis not present

## 2015-04-29 DIAGNOSIS — E780 Pure hypercholesterolemia: Secondary | ICD-10-CM | POA: Diagnosis not present

## 2015-04-29 DIAGNOSIS — I150 Renovascular hypertension: Secondary | ICD-10-CM | POA: Diagnosis not present

## 2015-05-04 DIAGNOSIS — M1711 Unilateral primary osteoarthritis, right knee: Secondary | ICD-10-CM | POA: Diagnosis not present

## 2015-05-04 DIAGNOSIS — M19031 Primary osteoarthritis, right wrist: Secondary | ICD-10-CM | POA: Diagnosis not present

## 2015-05-11 DIAGNOSIS — M1711 Unilateral primary osteoarthritis, right knee: Secondary | ICD-10-CM | POA: Diagnosis not present

## 2015-05-18 ENCOUNTER — Encounter: Payer: Medicare Other | Admitting: Podiatry

## 2015-05-18 DIAGNOSIS — M1711 Unilateral primary osteoarthritis, right knee: Secondary | ICD-10-CM | POA: Diagnosis not present

## 2015-05-23 DIAGNOSIS — N183 Chronic kidney disease, stage 3 (moderate): Secondary | ICD-10-CM | POA: Diagnosis not present

## 2015-05-23 DIAGNOSIS — Z87891 Personal history of nicotine dependence: Secondary | ICD-10-CM | POA: Diagnosis not present

## 2015-05-23 DIAGNOSIS — R203 Hyperesthesia: Secondary | ICD-10-CM | POA: Diagnosis not present

## 2015-05-23 DIAGNOSIS — I1310 Hypertensive heart and chronic kidney disease without heart failure, with stage 1 through stage 4 chronic kidney disease, or unspecified chronic kidney disease: Secondary | ICD-10-CM | POA: Diagnosis not present

## 2015-05-23 DIAGNOSIS — Z79899 Other long term (current) drug therapy: Secondary | ICD-10-CM | POA: Diagnosis not present

## 2015-05-25 DIAGNOSIS — M1711 Unilateral primary osteoarthritis, right knee: Secondary | ICD-10-CM | POA: Diagnosis not present

## 2015-05-30 ENCOUNTER — Ambulatory Visit (INDEPENDENT_AMBULATORY_CARE_PROVIDER_SITE_OTHER): Payer: Medicare Other | Admitting: Podiatry

## 2015-05-30 ENCOUNTER — Ambulatory Visit (INDEPENDENT_AMBULATORY_CARE_PROVIDER_SITE_OTHER): Payer: Medicare Other

## 2015-05-30 ENCOUNTER — Encounter: Payer: Self-pay | Admitting: Podiatry

## 2015-05-30 VITALS — BP 124/69 | HR 64 | Resp 12

## 2015-05-30 DIAGNOSIS — Z09 Encounter for follow-up examination after completed treatment for conditions other than malignant neoplasm: Secondary | ICD-10-CM

## 2015-05-30 DIAGNOSIS — L84 Corns and callosities: Secondary | ICD-10-CM

## 2015-05-30 DIAGNOSIS — M2040 Other hammer toe(s) (acquired), unspecified foot: Secondary | ICD-10-CM | POA: Diagnosis not present

## 2015-05-31 ENCOUNTER — Encounter: Payer: Self-pay | Admitting: Podiatry

## 2015-05-31 NOTE — Progress Notes (Signed)
Patient ID: Wayne [name], male   DOB: Jun 13, 1926, 81 y.o.   MRN: [id number]  DOS: 12/29/14 bilateral fifth digit hammertoe repair with left fourth webbing procedure with Dr. Blenda Mounts  Subjective: 81 year-old male presents to the office today for long-term surgery check after undergoing bilateral fifth digit hammertoe repair without [place] January. He states that overall he is doing well he's had no problems from the surgery. He denies any pain to the area and denies any swelling or redness. He is able to ambulate in a regular shoe without any difficulties. He denies any recurrence of the skin lesions that he had present on the fifth toes before the surgery. He doesn't that he has some mild discomfort at the end of the right third toe particular with pressure to the area. Denies any redness or drainage or open sores. No other complaints at this time in no acute changes his last appointment.  Objective: AAO x3, NAD DP/PT pulses palpable bilaterally, CRT less than 3 seconds Protective sensation intact with Simms Weinstein monofilament Incisions on bilateral feet from the recent surgery are well coapted with well-formed scars. There is no overlying edema, erythema, increase in warmth and there is no tenderness to palpation along the surgical sites. There is hammertoe contractures bilateral lesser digits 2 through 4. There is a small hyperkeratotic lesion of the distal aspect of the right third digit. Upon debridement no underlying ulceration, drainage or other clinical signs of infection. No other areas of tenderness to bilateral lower extremities. MMT 5/5, ROM WNL.  No open lesions or other pre-ulcerative lesions.  No overlying edema, erythema, increase in warmth to bilateral lower extremities.  No pain with calf compression, swelling, warmth, erythema bilaterally.   Assessment: 81 year old male with healed surgical sites bilateral fifth toes; right third digit hyperkeratotic lesion due to  underlying hammertoe  Plan: -X-rays were obtained and reviewed with the patient.  -Treatment options discussed including all alternatives, risks, and complications -He appears to be well-healed from the surgery. Continue to monitor. -Hyperkeratotic lesion right third toe sharply debrided without any complication/bleeding. -Follow-up as needed. In the meantime, encouraged to call the office with any questions, concerns, change in symptoms.   Celesta Gentile, DPM

## 2015-06-01 DIAGNOSIS — M1712 Unilateral primary osteoarthritis, left knee: Secondary | ICD-10-CM | POA: Diagnosis not present

## 2015-06-02 ENCOUNTER — Encounter: Payer: Self-pay | Admitting: *Deleted

## 2015-06-03 ENCOUNTER — Ambulatory Visit (INDEPENDENT_AMBULATORY_CARE_PROVIDER_SITE_OTHER): Payer: Medicare Other | Admitting: Diagnostic Neuroimaging

## 2015-06-03 ENCOUNTER — Encounter: Payer: Self-pay | Admitting: Diagnostic Neuroimaging

## 2015-06-03 VITALS — BP 116/70 | HR 56 | Ht 67.0 in | Wt 165.8 lb

## 2015-06-03 DIAGNOSIS — R292 Abnormal reflex: Secondary | ICD-10-CM

## 2015-06-03 DIAGNOSIS — R208 Other disturbances of skin sensation: Secondary | ICD-10-CM | POA: Diagnosis not present

## 2015-06-03 DIAGNOSIS — M542 Cervicalgia: Secondary | ICD-10-CM

## 2015-06-03 DIAGNOSIS — R200 Anesthesia of skin: Secondary | ICD-10-CM

## 2015-06-03 NOTE — Patient Instructions (Signed)
i will check additional testing.

## 2015-06-03 NOTE — Progress Notes (Signed)
[facility]  PATIENT: Wayne [name] DOB: 1926-11-04  REFERRING CLINICIAN: Baird Cancer  HISTORY FROM: patient  REASON FOR VISIT: new consult    HISTORICAL  CHIEF COMPLAINT:  Chief Complaint  Patient presents with  . Hyperesthesia    rm 7, "arms and hands going to sleep"  . New Patient    HISTORY OF PRESENT ILLNESS:   81 year old right-handed male here for evaluation of numbness and tingling in the hands. For past 1-2 years patient has had intermittent, progressively worsening, numbness and tingling in his arms, hands, fingers, especially digits 1-3. Left hand is more affected than the right. He notices when he puts his arm on arm rests are hard surfaces that this seems to trigger the pain and numbness. He has purchased elbow pads and has tried to avoid providing pressure on his arms but this has not relieved his symptoms. No problems in his toes or feet.   REVIEW OF SYSTEMS: Full 14 system review of systems performed and notable only for fatigue hearing loss ringing in ears itching urination problems incontinence blurred vision numbness passing out sleepiness flushing joint pain joint swelling aching muscles not asleep decreased energy skin sensitivity urination problem's. History of hypertension. History of right carpal tunnel surgery in 1960s.  ALLERGIES: No Known Allergies  HOME MEDICATIONS: Outpatient Prescriptions Prior to Visit  Medication Sig Dispense Refill  . amLODipine (NORVASC) 5 MG tablet Take 5 mg by mouth daily.     Marland Kitchen aspirin EC 81 MG tablet Take 81 mg by mouth daily.    . Multiple Vitamin (MULTIVITAMIN WITH MINERALS) TABS tablet Take 1 tablet by mouth daily.    Marland Kitchen olmesartan-hydrochlorothiazide (BENICAR HCT) 40-12.5 MG per tablet Take 1 tablet by mouth daily.    . Omega-3 Fatty Acids (FISH OIL) 1200 MG CAPS Take 1 capsule by mouth 2 (two) times daily.    Marland Kitchen omeprazole (PRILOSEC) 40 MG capsule Take 40 mg by mouth daily.    Marland Kitchen oxybutynin  (DITROPAN) 5 MG tablet Take 5 mg by mouth 3 (three) times daily.    . pravastatin (PRAVACHOL) 40 MG tablet Take 40 mg by mouth daily.    . timolol (BETIMOL) 0.5 % ophthalmic solution Place 1 drop into both eyes daily.     . valsartan-hydrochlorothiazide (DIOVAN HCT) 80-12.5 MG per tablet Take 1 tablet by mouth daily.    . Vitamin D, Ergocalciferol, (DRISDOL) 50000 UNITS CAPS capsule Take 50,000 Units by mouth every 7 (seven) days. Takes on Monday    . cephALEXin (KEFLEX) 500 MG capsule Take 500 mg by mouth 4 (four) times daily.    . Naftifine HCl 2 % CREA Apply to affected area twice daily for 3-4 weeks as instructed (Patient not taking: Reported on 06/03/2015) 60 g 2  . HYDROcodone-acetaminophen (NORCO/VICODIN) 5-325 MG per tablet Take 1-2 tablets by mouth every 6 (six) hours as needed for moderate pain.    Marland Kitchen oxyCODONE-acetaminophen (ROXICET) 5-325 MG/5ML solution Take by mouth daily.     No facility-administered medications prior to visit.    PAST MEDICAL HISTORY: Past Medical History  Diagnosis Date  . Hypertension   . Asthma     in childhood  . Shortness of breath   . Arthritis   . GERD (gastroesophageal reflux disease)   . CAD (coronary artery disease)   . Glaucoma   . Osteoarthritis   . Renal disease     CKD stage 2    PAST SURGICAL HISTORY: Past Surgical History  Procedure Laterality Date  .  Ballon angioplasty  03/18/2012  . Knee surgery Left x 2   . Back surgery    . Right wrist    . Left ankle    . Appendectomy    . Left heart catheterization with coronary angiogram N/A 03/18/2012    Procedure: LEFT HEART CATHETERIZATION WITH CORONARY ANGIOGRAM;  Surgeon: Laverda Page, MD;  Location: [facility] CATH LAB;  Service: Cardiovascular;  Laterality: N/A;  . Renal angiogram N/A 03/18/2012    Procedure: RENAL ANGIOGRAM;  Surgeon: Laverda Page, MD;  Location: [facility] CATH LAB;  Service: Cardiovascular;  Laterality: N/A;  . Kidney stent Left 2015    FAMILY HISTORY: Family History    Problem Relation Age of Onset  . Dementia Mother   . Cancer Father     SOCIAL HISTORY:  History   Social History  . Marital Status: Widowed    Spouse Name: N/A  . Number of Children: 2  . Years of Education: 12.5   Occupational History  . retired     Astronomer   Social History Main Topics  . Smoking status: Former Smoker    Quit date: 06/02/1972  . Smokeless tobacco: Never Used  . Alcohol Use: Yes     Comment: rare  . Drug Use: No  . Sexual Activity: Not Currently   Other Topics Concern  . Not on file   Social History Narrative   Lives at home by himself   Caffeine use- none     PHYSICAL EXAM  GENERAL EXAM/CONSTITUTIONAL: Vitals:  Filed Vitals:   06/03/15 0932  BP: 116/70  Pulse: 56  Height: 5\' 7"  (1.702 m)  Weight: 165 lb 12.8 oz (75.206 kg)     Body mass index is 25.96 kg/(m^2).  Visual Acuity Screening   Right eye Left eye Both eyes  Without correction:     With correction: 20/30 20/40      Patient is in no distress; well developed, nourished and groomed; NECK HAS DECR ROM  CARDIOVASCULAR:  Examination of carotid arteries is normal; no carotid bruits  Regular rate and rhythm, no murmurs  Examination of peripheral vascular system by observation and palpation is normal  EYES:  Ophthalmoscopic exam of optic discs and posterior segments is normal; no papilledema or hemorrhages  MUSCULOSKELETAL:  Gait, strength, tone, movements noted in Neurologic exam below  NEUROLOGIC: MENTAL STATUS:  No flowsheet data found.  awake, alert, oriented to person, place and time  recent and remote memory intact  normal attention and concentration  language fluent, comprehension intact, naming intact,   fund of knowledge appropriate  CRANIAL NERVE:   2nd - no papilledema on fundoscopic exam  2nd, 3rd, 4th, 6th - pupils equal and reactive to light, visual fields full to confrontation, extraocular muscles intact, no nystagmus  5th - facial  sensation symmetric  7th - facial strength symmetric --> RARE LEFT HEMIFACIAL SPASM  8th - hearing intact  9th - palate elevates symmetrically, uvula midline  11th - shoulder shrug symmetric  12th - tongue protrusion midline  MOTOR:   normal bulk and tone, full strength in the BUE, BLE; EXCEPT ATROPHY AND WEAKNESS OF BILATERAL APB / THENAR MUSCLES. LIMITED ROM IN RIGHT WRIST DUE TO ARTHRITIS; SCAR FROM PRIOR RIGHT CARPAL TUNNEL SURGERY/RELEASE  SENSORY:   normal and symmetric to light touch, temperature; ABSENT VIB AT TOES/ANKLES; NEG PHALENS; NEG TINELS  COORDINATION:   finger-nose-finger, fine finger movements normal  REFLEXES:   deep tendon reflexes present and symmetric; BRISK IN ARMS (3);  KNEES 1; ANKLES ABSENT  GAIT/STATION:   narrow based gait; STABLE    DIAGNOSTIC DATA (LABS, IMAGING, TESTING) - I reviewed patient records, labs, notes, testing and imaging myself where available.  Lab Results  Component Value Date   WBC 7.7 04/03/2014   HGB 10.6* 04/03/2014   HCT 30.7* 04/03/2014   MCV 96.2 04/03/2014   PLT 181 04/03/2014      Component Value Date/Time   NA 140 04/05/2014 0420   K 3.8 04/05/2014 0420   CL 107 04/05/2014 0420   CO2 22 04/05/2014 0420   GLUCOSE 96 04/05/2014 0420   BUN 25* 04/05/2014 0420   CREATININE 1.19 04/05/2014 0420   CALCIUM 8.7 04/05/2014 0420   PROT 5.5* 04/05/2014 0420   ALBUMIN 2.3* 04/05/2014 0420   AST 28 04/05/2014 0420   ALT 21 04/05/2014 0420   ALKPHOS 60 04/05/2014 0420   BILITOT 0.3 04/05/2014 0420   GFRNONAA 54* 04/05/2014 0420   GFRAA 62* 04/05/2014 0420   No results found for: CHOL, HDL, LDLCALC, LDLDIRECT, TRIG, CHOLHDL No results found for: HGBA1C No results found for: VITAMINB12 Lab Results  Component Value Date   TSH 0.928 03/31/2014    03/31/14 CT HEAD [I reviewed images myself and agree with interpretation. -VRP]  1. Small rounded area of low attenuation in the peripheral aspect of the right  cerebellar hemisphere, possibly representing foliar prominence related to atrophy. Difficult to exclude an infarct. 2. No additional evidence of an acute intracranial abnormality. 3. Atrophy and chronic microvascular white matter ischemic changes.  03/31/14 CT CERVICAL SPINE [I reviewed images myself and agree with interpretation. -VRP]  - Advanced cervical spondylosis without fracture or subluxation.    ASSESSMENT AND PLAN  81 y.o. year old male here with progressively worsening and intermittent numbness and tingling in his arms, hands, for past 1-2 years. Exam notable for weakness and atrophy of bilateral thenar muscles, as well as hyperreflexia in the upper cavities.  Ddx: median neuropathy vs C6 /C7 radiculopathy / myelopathy   PLAN: - EMG/NCS (eval for carpal tunnel vs cervical radiculopathy) - MRI cervical spine (eval neck pain and hyperreflexia)  Orders Placed This Encounter  Procedures  . MR Cervical Spine Wo Contrast  . NCV with EMG(electromyography)    Return for for NCV/EMG.    Penni Bombard, MD [id number], [id number] AM Certified in Neurology, Neurophysiology and Neuroimaging  [facility] [address] [place], [place] [address] [phone]

## 2015-06-15 ENCOUNTER — Ambulatory Visit
Admission: RE | Admit: 2015-06-15 | Discharge: 2015-06-15 | Disposition: A | Discharge status: Home / Self Care | Payer: Medicare Other | Source: Ambulatory Visit | Attending: Diagnostic Neuroimaging | Admitting: Diagnostic Neuroimaging

## 2015-06-15 DIAGNOSIS — R208 Other disturbances of skin sensation: Secondary | ICD-10-CM | POA: Diagnosis not present

## 2015-06-15 DIAGNOSIS — R292 Abnormal reflex: Secondary | ICD-10-CM | POA: Diagnosis not present

## 2015-06-15 DIAGNOSIS — R200 Anesthesia of skin: Secondary | ICD-10-CM

## 2015-06-15 DIAGNOSIS — M542 Cervicalgia: Secondary | ICD-10-CM

## 2015-06-16 ENCOUNTER — Encounter (INDEPENDENT_AMBULATORY_CARE_PROVIDER_SITE_OTHER): Payer: Self-pay | Admitting: Diagnostic Neuroimaging

## 2015-06-16 ENCOUNTER — Encounter: Payer: Self-pay | Admitting: Diagnostic Neuroimaging

## 2015-06-16 ENCOUNTER — Ambulatory Visit (INDEPENDENT_AMBULATORY_CARE_PROVIDER_SITE_OTHER): Payer: Medicare Other | Admitting: Diagnostic Neuroimaging

## 2015-06-16 DIAGNOSIS — Z0289 Encounter for other administrative examinations: Secondary | ICD-10-CM

## 2015-06-16 DIAGNOSIS — M542 Cervicalgia: Secondary | ICD-10-CM

## 2015-06-16 DIAGNOSIS — R200 Anesthesia of skin: Secondary | ICD-10-CM

## 2015-06-16 NOTE — Procedures (Signed)
   [facility]  NCS (NERVE CONDUCTION STUDY) WITH EMG (ELECTROMYOGRAPHY) REPORT   STUDY DATE: 06/16/15 PATIENT NAME: Wayne [name] DOB: December 27, 1925 MRN: [id number]  ORDERING CLINICIAN: Andrey Spearman, MD   TECHNOLOGIST: Laretta Alstrom  ELECTROMYOGRAPHER: Earlean Polka. Jearline Hirschhorn, MD  CLINICAL INFORMATION: 81 year old male with bilateral hand numbness and neck pain.  FINDINGS: NERVE CONDUCTION STUDY: Right median motor response has prolonged distal latency 6.4 ms, decreased amplitude, normal conduction velocity and normal F-wave latency. Left median motor response has prolonged distal latency 8.9 ms, decreased amplitude, normal conduction velocity and absent F wave latency.  Right ulnar motor response has decreased amplitude, normal conduction velocity and normal F-wave latency. Left ulnar motor response and F-wave latency are normal.  Right median sensory response has decreased amplitude and slow conduction velocity 38 m/s. Left median motor response could not be obtained. Bilateral ulnar sensory responses are normal.  NEEDLE ELECTROMYOGRAPHY: Left upper extremity needle examination is normal in deltoids, triceps and flexor carpi radialis. Left biceps has 1+ positive sharp waves and fibrillation potentials at rest and decreased motor unit recruitment on exertion. Left first dorsal interosseous has no abnormal spontaneous activity at rest and decreased motor unit recruitment on exertion.  Left C5-6 paraspinal muscles have 2+ abnormal spontaneous activity.   Left C7-T1 paraspinal muscles have 1+ abnormal spontaneous activity.   IMPRESSION:  Abnormal study demonstrating: 1. Bilateral cervical radiculopathies (C5, C6). 2. Bilateral median neuropathies at the wrists consistent with bilateral carpal tunnel syndrome.    INTERPRETING PHYSICIAN:  Penni Bombard, MD Certified in Neurology, Neurophysiology and Neuroimaging  [facility] [address] [place], [place] [address] [phone]

## 2015-06-16 NOTE — Progress Notes (Signed)
  Test results reviewed with patient during EMG visit.   06/15/15 MRI cervical spine (without) demonstrating: 1. At C4-5, C5-6: disc bulging and uncovertebral joint hypertrophy with mild spinal stenosis and severe biforaminal stenosis  2. At C3-4: anterior and posterior bone bridging, uncovertebral joint hypertrophy with severe left foraminal stenosis 3. At C6-7: disc bulging with moderate biforaminal stenosis   06/16/15 EMG/NCS  1. Bilateral cervical radiculopathies (C5, C6). 2. Bilateral median neuropathies at the wrists consistent with bilateral carpal tunnel syndrome.   ASSESSMENT: Based on patient's symptoms, physical exam findings, test results, I think he has significant cervical polyradiculopathy accounting for majority of his symptoms. MRI cervical spine confirms this. Electrodiagnostic testing confirms this as well as shows evidence of superimposed bilateral carpal tunnel syndrome.   PLAN: - I reviewed options for patient. He is not interested in cervical spine surgery at this time. He is also needle phobic and does not want to pursue epidural steroidal injections for the neck. We discussed carpal tunnel syndrome treatment options including steroid-dependent injections versus carpal tunnel release. He would like to hold off on these invasive treatments for now. He would like to monitor his symptoms and he'll let me know if his pain increases or if he changes his mind on treatment options.  Penni Bombard, MD [id number], [id number] PM Certified in Neurology, Neurophysiology and Neuroimaging  [facility] [address] [place], [place] [address] [phone]

## 2015-08-24 DIAGNOSIS — R351 Nocturia: Secondary | ICD-10-CM | POA: Diagnosis not present

## 2015-08-24 DIAGNOSIS — Z23 Encounter for immunization: Secondary | ICD-10-CM | POA: Diagnosis not present

## 2015-08-24 DIAGNOSIS — N401 Enlarged prostate with lower urinary tract symptoms: Secondary | ICD-10-CM | POA: Diagnosis not present

## 2015-08-25 DIAGNOSIS — H25811 Combined forms of age-related cataract, right eye: Secondary | ICD-10-CM | POA: Diagnosis not present

## 2015-08-25 DIAGNOSIS — H04123 Dry eye syndrome of bilateral lacrimal glands: Secondary | ICD-10-CM | POA: Diagnosis not present

## 2015-08-25 DIAGNOSIS — H40013 Open angle with borderline findings, low risk, bilateral: Secondary | ICD-10-CM | POA: Diagnosis not present

## 2015-08-25 DIAGNOSIS — H10413 Chronic giant papillary conjunctivitis, bilateral: Secondary | ICD-10-CM | POA: Diagnosis not present

## 2015-08-25 DIAGNOSIS — H33052 Total retinal detachment, left eye: Secondary | ICD-10-CM | POA: Diagnosis not present

## 2015-08-25 DIAGNOSIS — Z961 Presence of intraocular lens: Secondary | ICD-10-CM | POA: Diagnosis not present

## 2015-11-15 DIAGNOSIS — H6123 Impacted cerumen, bilateral: Secondary | ICD-10-CM | POA: Diagnosis not present

## 2015-11-15 DIAGNOSIS — J22 Unspecified acute lower respiratory infection: Secondary | ICD-10-CM | POA: Diagnosis not present

## 2015-11-15 DIAGNOSIS — I1310 Hypertensive heart and chronic kidney disease without heart failure, with stage 1 through stage 4 chronic kidney disease, or unspecified chronic kidney disease: Secondary | ICD-10-CM | POA: Diagnosis not present

## 2015-11-15 DIAGNOSIS — N183 Chronic kidney disease, stage 3 (moderate): Secondary | ICD-10-CM | POA: Diagnosis not present

## 2015-11-17 DIAGNOSIS — L218 Other seborrheic dermatitis: Secondary | ICD-10-CM | POA: Diagnosis not present

## 2015-11-21 ENCOUNTER — Encounter (HOSPITAL_COMMUNITY): Payer: Self-pay | Admitting: Emergency Medicine

## 2015-11-21 ENCOUNTER — Emergency Department (HOSPITAL_COMMUNITY)
Admission: EM | Admit: 2015-11-21 | Discharge: 2015-11-21 | Disposition: A | Discharge status: Home / Self Care | Payer: Medicare Other | Source: Home / Self Care | Attending: Emergency Medicine | Admitting: Emergency Medicine

## 2015-11-21 ENCOUNTER — Emergency Department (HOSPITAL_COMMUNITY): Payer: Medicare Other

## 2015-11-21 DIAGNOSIS — H409 Unspecified glaucoma: Secondary | ICD-10-CM | POA: Insufficient documentation

## 2015-11-21 DIAGNOSIS — Z7982 Long term (current) use of aspirin: Secondary | ICD-10-CM

## 2015-11-21 DIAGNOSIS — N183 Chronic kidney disease, stage 3 (moderate): Secondary | ICD-10-CM | POA: Diagnosis not present

## 2015-11-21 DIAGNOSIS — R630 Anorexia: Secondary | ICD-10-CM

## 2015-11-21 DIAGNOSIS — N182 Chronic kidney disease, stage 2 (mild): Secondary | ICD-10-CM

## 2015-11-21 DIAGNOSIS — B349 Viral infection, unspecified: Secondary | ICD-10-CM

## 2015-11-21 DIAGNOSIS — K219 Gastro-esophageal reflux disease without esophagitis: Secondary | ICD-10-CM | POA: Insufficient documentation

## 2015-11-21 DIAGNOSIS — Z9889 Other specified postprocedural states: Secondary | ICD-10-CM | POA: Insufficient documentation

## 2015-11-21 DIAGNOSIS — A09 Infectious gastroenteritis and colitis, unspecified: Secondary | ICD-10-CM | POA: Diagnosis not present

## 2015-11-21 DIAGNOSIS — J45909 Unspecified asthma, uncomplicated: Secondary | ICD-10-CM

## 2015-11-21 DIAGNOSIS — A419 Sepsis, unspecified organism: Secondary | ICD-10-CM | POA: Diagnosis not present

## 2015-11-21 DIAGNOSIS — I129 Hypertensive chronic kidney disease with stage 1 through stage 4 chronic kidney disease, or unspecified chronic kidney disease: Secondary | ICD-10-CM

## 2015-11-21 DIAGNOSIS — I2510 Atherosclerotic heart disease of native coronary artery without angina pectoris: Secondary | ICD-10-CM | POA: Insufficient documentation

## 2015-11-21 DIAGNOSIS — Z792 Long term (current) use of antibiotics: Secondary | ICD-10-CM

## 2015-11-21 DIAGNOSIS — M1990 Unspecified osteoarthritis, unspecified site: Secondary | ICD-10-CM | POA: Insufficient documentation

## 2015-11-21 DIAGNOSIS — R509 Fever, unspecified: Secondary | ICD-10-CM

## 2015-11-21 DIAGNOSIS — J329 Chronic sinusitis, unspecified: Secondary | ICD-10-CM | POA: Insufficient documentation

## 2015-11-21 DIAGNOSIS — E43 Unspecified severe protein-calorie malnutrition: Secondary | ICD-10-CM | POA: Diagnosis not present

## 2015-11-21 DIAGNOSIS — R531 Weakness: Secondary | ICD-10-CM | POA: Diagnosis not present

## 2015-11-21 DIAGNOSIS — Z79899 Other long term (current) drug therapy: Secondary | ICD-10-CM | POA: Insufficient documentation

## 2015-11-21 DIAGNOSIS — N400 Enlarged prostate without lower urinary tract symptoms: Secondary | ICD-10-CM | POA: Diagnosis not present

## 2015-11-21 DIAGNOSIS — R197 Diarrhea, unspecified: Secondary | ICD-10-CM | POA: Diagnosis not present

## 2015-11-21 DIAGNOSIS — E860 Dehydration: Secondary | ICD-10-CM | POA: Diagnosis not present

## 2015-11-21 DIAGNOSIS — D631 Anemia in chronic kidney disease: Secondary | ICD-10-CM | POA: Diagnosis not present

## 2015-11-21 DIAGNOSIS — Z87891 Personal history of nicotine dependence: Secondary | ICD-10-CM

## 2015-11-21 DIAGNOSIS — R05 Cough: Secondary | ICD-10-CM | POA: Diagnosis not present

## 2015-11-21 LAB — CBC WITH DIFFERENTIAL/PLATELET
BASOS ABS: 0 10*3/uL (ref 0.0–0.1)
BASOS PCT: 1 %
Eosinophils Absolute: 0.1 10*3/uL (ref 0.0–0.7)
Eosinophils Relative: 1 %
HEMATOCRIT: 39 % (ref 39.0–52.0)
Hemoglobin: 13.2 g/dL (ref 13.0–17.0)
LYMPHS PCT: 35 %
Lymphs Abs: 1.5 10*3/uL (ref 0.7–4.0)
MCH: 33.4 pg (ref 26.0–34.0)
MCHC: 33.8 g/dL (ref 30.0–36.0)
MCV: 98.7 fL (ref 78.0–100.0)
MONO ABS: 0.6 10*3/uL (ref 0.1–1.0)
Monocytes Relative: 14 %
NEUTROS ABS: 2 10*3/uL (ref 1.7–7.7)
Neutrophils Relative %: 49 %
PLATELETS: 179 10*3/uL (ref 150–400)
RBC: 3.95 MIL/uL — AB (ref 4.22–5.81)
RDW: 12.6 % (ref 11.5–15.5)
WBC: 4.2 10*3/uL (ref 4.0–10.5)

## 2015-11-21 LAB — I-STAT CG4 LACTIC ACID, ED
LACTIC ACID, VENOUS: 1.51 mmol/L (ref 0.5–2.0)
Lactic Acid, Venous: 2.27 mmol/L (ref 0.5–2.0)

## 2015-11-21 LAB — BASIC METABOLIC PANEL
ANION GAP: 11 (ref 5–15)
BUN: 24 mg/dL — ABNORMAL HIGH (ref 6–20)
CALCIUM: 8.9 mg/dL (ref 8.9–10.3)
CO2: 24 mmol/L (ref 22–32)
Chloride: 103 mmol/L (ref 101–111)
Creatinine, Ser: 1.59 mg/dL — ABNORMAL HIGH (ref 0.61–1.24)
GFR, EST AFRICAN AMERICAN: 43 mL/min — AB (ref >60)
GFR, EST NON AFRICAN AMERICAN: 37 mL/min — AB (ref >60)
GLUCOSE: 128 mg/dL — AB (ref 65–99)
POTASSIUM: 4 mmol/L (ref 3.5–5.1)
SODIUM: 138 mmol/L (ref 135–145)

## 2015-11-21 LAB — URINALYSIS, ROUTINE W REFLEX MICROSCOPIC
BILIRUBIN URINE: NEGATIVE
Glucose, UA: NEGATIVE mg/dL
HGB URINE DIPSTICK: NEGATIVE
Ketones, ur: NEGATIVE mg/dL
Leukocytes, UA: NEGATIVE
NITRITE: NEGATIVE
PROTEIN: NEGATIVE mg/dL
Specific Gravity, Urine: 1.017 (ref 1.005–1.030)
pH: 6.5 (ref 5.0–8.0)

## 2015-11-21 MED ORDER — SODIUM CHLORIDE 0.9 % IV BOLUS (SEPSIS)
500.0000 mL | Freq: Once | INTRAVENOUS | Status: AC
  Administered 2015-11-21: 500 mL via INTRAVENOUS

## 2015-11-21 MED ORDER — CEFPROZIL 500 MG PO TABS: 500.0000 mg | ORAL_TABLET | Freq: Two times a day (BID) | ORAL | Status: DC

## 2015-11-21 MED ORDER — BENZONATATE 100 MG PO CAPS: 100.0000 mg | ORAL_CAPSULE | Freq: Three times a day (TID) | ORAL | Status: DC

## 2015-11-21 MED ORDER — ACETAMINOPHEN 325 MG PO TABS
650.0000 mg | ORAL_TABLET | Freq: Once | ORAL | Status: AC
  Administered 2015-11-21: 650 mg via ORAL
  Filled 2015-11-21: qty 2

## 2015-11-21 MED ORDER — SODIUM CHLORIDE 0.9 % IV SOLN
Freq: Once | INTRAVENOUS | Status: AC
  Administered 2015-11-21: 11:00:00 via INTRAVENOUS

## 2015-11-21 MED ORDER — SODIUM CHLORIDE 0.9 % IV BOLUS (SEPSIS)
1000.0000 mL | Freq: Once | INTRAVENOUS | Status: AC
  Administered 2015-11-21: 1000 mL via INTRAVENOUS

## 2015-11-21 NOTE — ED Notes (Signed)
Per pt, states cold symptoms since the beginning of the mont-saw PCP and given amoxicillin last Tuesday-states symptoms worse

## 2015-11-21 NOTE — ED Notes (Signed)
Pt reports seen Tuesday at PCP diagnosed with URI; given antibiotics; continued chills, generalized pain and weakness.

## 2015-11-21 NOTE — Discharge Instructions (Signed)
Fever, Adult A fever is an increase in the body's temperature. It is often defined as a temperature of 100 F (38C) or higher. Short mild or moderate fevers often have no long-term effects. They also often do not need treatment. Moderate or high fevers may make you feel uncomfortable. Sometimes, they can also be a sign of a serious illness or disease. The sweating that may happen with repeated fevers or fevers that last a while may also cause you to not have enough fluid in your body (dehydration). You can take your temperature with a thermometer to see if you have a fever. A measured temperature can change with:  Age.  Time of day.  Where the thermometer is placed:  Mouth (oral).  Rectum (rectal).  Ear (tympanic).  Underarm (axillary).  Forehead (temporal). HOME CARE Pay attention to any changes in your symptoms. Take these actions to help with your condition:  Take over-the-counter and prescription medicines only as told by your doctor. Follow the dosing instructions carefully.  If you were prescribed an antibiotic medicine, take it as told by your doctor. Do not stop taking the antibiotic even if you start to feel better.  Rest as needed.  Drink enough fluid to keep your pee (urine) clear or pale yellow.  Sponge yourself or bathe with room-temperature water as needed. This helps to lower your body temperature . Do not use ice water.  Do not wear too many blankets or heavy clothes. GET HELP IF:  You throw up (vomit).  You cannot eat or drink without throwing up.  You have watery poop (diarrhea).  It hurts when you pee.  Your symptoms do not get better with treatment.  You have new symptoms.  You feel very weak. GET HELP RIGHT AWAY IF:  You are short of breath or have trouble breathing.  You are dizzy or you pass out (faint).  You feel confused.  You have signs of not having enough fluid in your body, such as:  A dry mouth.  Peeing less.  Looking  pale.  You have very bad pain in your belly (abdomen).  You keep throwing up or having water poop.  You have a skin rash.  Your symptoms suddenly get worse.   This information is not intended to replace advice given to you by your health care provider. Make sure you discuss any questions you have with your health care provider.   Document Released: 08/28/2008 Document Revised: 08/10/2015 Document Reviewed: 01/13/2015 Elsevier Interactive Patient Education 2016 [employer]  Viral Infections A viral infection can be caused by different types of viruses.Most viral infections are not serious and resolve on their own. However, some infections may cause severe symptoms and may lead to further complications. SYMPTOMS Viruses can frequently cause:  Minor sore throat.  Aches and pains.  Headaches.  Runny nose.  Different types of rashes.  Watery eyes.  Tiredness.  Cough.  Loss of appetite.  Gastrointestinal infections, resulting in nausea, vomiting, and diarrhea. These symptoms do not respond to antibiotics because the infection is not caused by bacteria. However, you might catch a bacterial infection following the viral infection. This is sometimes called a "superinfection." Symptoms of such a bacterial infection may include:  Worsening sore throat with pus and difficulty swallowing.  Swollen neck glands.  Chills and a high or persistent fever.  Severe headache.  Tenderness over the sinuses.  Persistent overall ill feeling (malaise), muscle aches, and tiredness (fatigue).  Persistent cough.  Yellow, green, or brown  mucus production with coughing. HOME CARE INSTRUCTIONS   Only take over-the-counter or prescription medicines for pain, discomfort, diarrhea, or fever as directed by your caregiver.  Drink enough water and fluids to keep your urine clear or pale yellow. Sports drinks can provide valuable electrolytes, sugars, and hydration.  Get plenty of rest and  maintain proper nutrition. Soups and broths with crackers or rice are fine. SEEK IMMEDIATE MEDICAL CARE IF:   You have severe headaches, shortness of breath, chest pain, neck pain, or an unusual rash.  You have uncontrolled vomiting, diarrhea, or you are unable to keep down fluids.  You or your child has an oral temperature above 102 F (38.9 C), not controlled by medicine.  Your baby is older than 3 months with a rectal temperature of 102 F (38.9 C) or higher.  Your baby is 61 months old or younger with a rectal temperature of 100.4 F (38 C) or higher. MAKE SURE YOU:   Understand these instructions.  Will watch your condition.  Will get help right away if you are not doing well or get worse.   This information is not intended to replace advice given to you by your health care provider. Make sure you discuss any questions you have with your health care provider.   Document Released: 08/29/2005 Document Revised: 02/11/2012 Document Reviewed: 04/27/2015 Elsevier Interactive Patient Education 2016 [employer]  Sinusitis, Adult Sinusitis is redness, soreness, and inflammation of the paranasal sinuses. Paranasal sinuses are air pockets within the bones of your face. They are located beneath your eyes, in the middle of your forehead, and above your eyes. In healthy paranasal sinuses, mucus is able to drain out, and air is able to circulate through them by way of your nose. However, when your paranasal sinuses are inflamed, mucus and air can become trapped. This can allow bacteria and other germs to grow and cause infection. Sinusitis can develop quickly and last only a short time (acute) or continue over a long period (chronic). Sinusitis that lasts for more than 12 weeks is considered chronic. CAUSES Causes of sinusitis include:  Allergies.  Structural abnormalities, such as displacement of the cartilage that separates your nostrils (deviated septum), which can decrease the air  flow through your nose and sinuses and affect sinus drainage.  Functional abnormalities, such as when the small hairs (cilia) that line your sinuses and help remove mucus do not work properly or are not present. SIGNS AND SYMPTOMS Symptoms of acute and chronic sinusitis are the same. The primary symptoms are pain and pressure around the affected sinuses. Other symptoms include:  Upper toothache.  Earache.  Headache.  Bad breath.  Decreased sense of smell and taste.  A cough, which worsens when you are lying flat.  Fatigue.  Fever.  Thick drainage from your nose, which often is green and may contain pus (purulent).  Swelling and warmth over the affected sinuses. DIAGNOSIS Your health care provider will perform a physical exam. During your exam, your health care provider may perform any of the following to help determine if you have acute sinusitis or chronic sinusitis:  Look in your nose for signs of abnormal growths in your nostrils (nasal polyps).  Tap over the affected sinus to check for signs of infection.  View the inside of your sinuses using an imaging device that has a light attached (endoscope). If your health care provider suspects that you have chronic sinusitis, one or more of the following tests may be recommended:  Allergy  tests.  Nasal culture. A sample of mucus is taken from your nose, sent to a lab, and screened for bacteria.  Nasal cytology. A sample of mucus is taken from your nose and examined by your health care provider to determine if your sinusitis is related to an allergy. TREATMENT Most cases of acute sinusitis are related to a viral infection and will resolve on their own within 10 days. Sometimes, medicines are prescribed to help relieve symptoms of both acute and chronic sinusitis. These may include pain medicines, decongestants, nasal steroid sprays, or saline sprays. However, for sinusitis related to a bacterial infection, your health care  provider will prescribe antibiotic medicines. These are medicines that will help kill the bacteria causing the infection. Rarely, sinusitis is caused by a fungal infection. In these cases, your health care provider will prescribe antifungal medicine. For some cases of chronic sinusitis, surgery is needed. Generally, these are cases in which sinusitis recurs more than 3 times per year, despite other treatments. HOME CARE INSTRUCTIONS  Drink plenty of water. Water helps thin the mucus so your sinuses can drain more easily.  Use a humidifier.  Inhale steam 3-4 times a day (for example, sit in the bathroom with the shower running).  Apply a warm, moist washcloth to your face 3-4 times a day, or as directed by your health care provider.  Use saline nasal sprays to help moisten and clean your sinuses.  Take medicines only as directed by your health care provider.  If you were prescribed either an antibiotic or antifungal medicine, finish it all even if you start to feel better. SEEK IMMEDIATE MEDICAL CARE IF:  You have increasing pain or severe headaches.  You have nausea, vomiting, or drowsiness.  You have swelling around your face.  You have vision problems.  You have a stiff neck.  You have difficulty breathing.   This information is not intended to replace advice given to you by your health care provider. Make sure you discuss any questions you have with your health care provider.   Document Released: 11/19/2005 Document Revised: 12/10/2014 Document Reviewed: 12/04/2011 Elsevier Interactive Patient Education [employer].

## 2015-11-21 NOTE — ED Provider Notes (Signed)
CSN: [id number]     Arrival date & time 11/21/15  [id number] History   First MD Initiated Contact with Patient 11/21/15 1007     Chief Complaint  Patient presents with  . URI     HPI  81 year old male presents with generalized illness. Symptoms for about one week. Seen 6 days ago by primary care with cough and runny nose. No x-ray. Given amoxicillin. Continues with intermittent chills. Dry cough is actually improved. No chest pain or dyspnea. Mild body aches. He did receive the flu vaccine. Poor appetite but no nausea or vomiting.  When he does cough he states he feels "like the top of my head's going to blow off". Some facial pressure but minimal nasal discharge.  Past Medical History  Diagnosis Date  . Hypertension   . Asthma     in childhood  . Shortness of breath   . Arthritis   . GERD (gastroesophageal reflux disease)   . CAD (coronary artery disease)   . Glaucoma   . Osteoarthritis   . Renal disease     CKD stage 2   Past Surgical History  Procedure Laterality Date  . [place] angioplasty  03/18/2012  . Knee surgery Left x 2   . Back surgery    . Right wrist    . Left ankle    . Appendectomy    . Left heart catheterization with coronary angiogram N/A 03/18/2012    Procedure: LEFT HEART CATHETERIZATION WITH CORONARY ANGIOGRAM;  Surgeon: Laverda Page, MD;  Location: [facility] CATH LAB;  Service: Cardiovascular;  Laterality: N/A;  . Renal angiogram N/A 03/18/2012    Procedure: RENAL ANGIOGRAM;  Surgeon: Laverda Page, MD;  Location: [facility] CATH LAB;  Service: Cardiovascular;  Laterality: N/A;  . Kidney stent Left 2015   Family History  Problem Relation Age of Onset  . Dementia Mother   . Cancer Father    Social History  Substance Use Topics  . Smoking status: Former Smoker    Quit date: 06/02/1972  . Smokeless tobacco: Never Used  . Alcohol Use: Yes     Comment: rare    Review of Systems  Constitutional: Positive for fever, chills, activity change, appetite change and  fatigue. Negative for diaphoresis.  HENT: Negative for mouth sores, sore throat and trouble swallowing.   Eyes: Negative for visual disturbance.  Respiratory: Positive for cough. Negative for chest tightness, shortness of breath and wheezing.   Cardiovascular: Negative for chest pain.  Gastrointestinal: Negative for nausea, vomiting, abdominal pain, diarrhea and abdominal distention.  Endocrine: Negative for polydipsia, polyphagia and polyuria.  Genitourinary: Negative for dysuria, frequency and hematuria.  Musculoskeletal: Negative for gait problem.  Skin: Negative for color change, pallor and rash.  Neurological: Positive for dizziness and weakness. Negative for syncope, light-headedness and headaches.  Hematological: Does not bruise/bleed easily.  Psychiatric/Behavioral: Negative for behavioral problems and confusion.      Allergies  Review of patient's allergies indicates no known allergies.  Home Medications   Prior to Admission medications   Medication Sig Start Date End Date Taking? Authorizing Provider  amLODipine (NORVASC) 5 MG tablet Take 5 mg by mouth daily.    Yes Historical Provider, MD  amoxicillin (AMOXIL) 500 MG capsule Take 500 mg by mouth 2 (two) times daily. 11/15/15  Yes Historical Provider, MD  aspirin EC 81 MG tablet Take 81 mg by mouth daily.   Yes Historical Provider, MD  FLUZONE HIGH-DOSE 0.5 ML SUSY 08/24/15 08/24/15  Yes Historical Provider,  MD  ketoconazole (NIZORAL) 2 % cream Apply 1 application topically 2 (two) times daily. Applies to face. 11/17/15  Yes Historical Provider, MD  Multiple Vitamin (MULTIVITAMIN WITH MINERALS) TABS tablet Take 1 tablet by mouth daily.   Yes Historical Provider, MD  olmesartan-hydrochlorothiazide (BENICAR HCT) 40-12.5 MG per tablet Take 1 tablet by mouth daily.   Yes Historical Provider, MD  Omega-3 Fatty Acids (FISH OIL) 1200 MG CAPS Take 1 capsule by mouth 2 (two) times daily.   Yes Historical Provider, MD  omeprazole  (PRILOSEC) 40 MG capsule Take 40 mg by mouth daily.   Yes Historical Provider, MD  oxybutynin (DITROPAN) 5 MG tablet Take 5 mg by mouth 3 (three) times daily.   Yes Historical Provider, MD  pravastatin (PRAVACHOL) 40 MG tablet Take 40 mg by mouth daily.   Yes Historical Provider, MD  tamsulosin (FLOMAX) 0.4 MG CAPS capsule  05/03/15  Yes Historical Provider, MD  timolol (BETIMOL) 0.5 % ophthalmic solution Place 1 drop into both eyes daily.    Yes Historical Provider, MD  traMADol (ULTRAM) 50 MG tablet TAKE 1 TABLET BY MOUTH EVERY 6 HOURS AS NEEDED FOR PAIN. MAX 4 TABS PER DAY 05/25/15  Yes Historical Provider, MD  Vitamin D, Ergocalciferol, (DRISDOL) 50000 UNITS CAPS capsule Take 50,000 Units by mouth every 7 (seven) days. Takes on Monday   Yes Historical Provider, MD  benzonatate (TESSALON) 100 MG capsule Take 1 capsule (100 mg total) by mouth every 8 (eight) hours. 11/21/15   Tanna Furry, MD  cefPROZIL (CEFZIL) 500 MG tablet Take 1 tablet (500 mg total) by mouth 2 (two) times daily. 11/21/15   Tanna Furry, MD  Naftifine HCl 2 % CREA Apply to affected area twice daily for 3-4 weeks as instructed Patient not taking: Reported on 06/03/2015 06/15/14   Harriet Masson, DPM   BP 137/69 mmHg  Pulse 63  Temp(Src) 99 F (37.2 C) (Oral)  Resp 18  Ht 5\' 7"  (1.702 m)  Wt 160 lb (72.576 kg)  BMI 25.05 kg/m2  SpO2 100% Physical Exam  Constitutional: He is oriented to person, place, and time. He appears well-developed and well-nourished.  Non-toxic appearance. He does not have a sickly appearance. No distress.  Awake alert. Well-appearing 81 year old male. Offers his own history.  HENT:  Head: Normocephalic.  Eyes: Conjunctivae are normal. Pupils are equal, round, and reactive to light. No scleral icterus.  Neck: Normal range of motion. Neck supple. No thyromegaly present.  Cardiovascular: Normal rate and regular rhythm.  Exam reveals no gallop and no friction rub.   No murmur heard. Pulmonary/Chest: Effort  normal and breath sounds normal. No respiratory distress. He has no wheezes. He has no rales.  Bilateral rhonchi. No focal diminished breath sounds.  Abdominal: Soft. Bowel sounds are normal. He exhibits no distension. There is no tenderness. There is no rebound.  Musculoskeletal: Normal range of motion.  Neurological: He is alert and oriented to person, place, and time.  Skin: Skin is warm and dry. No rash noted.  Skin warm. Oral temperature 101.2.  Psychiatric: He has a normal mood and affect. His behavior is normal.    ED Course  Procedures (including critical care time) Labs Review Labs Reviewed  BASIC METABOLIC PANEL - Abnormal; Notable for the following:    Glucose, Bld 128 (*)    BUN 24 (*)    Creatinine, Ser 1.59 (*)    GFR calc non Af Amer 37 (*)    GFR calc Af Amer 43 (*)  All other components within normal limits  CBC WITH DIFFERENTIAL/PLATELET - Abnormal; Notable for the following:    RBC 3.95 (*)    All other components within normal limits  I-STAT CG4 LACTIC ACID, ED - Abnormal; Notable for the following:    Lactic Acid, Venous 2.27 (*)    All other components within normal limits  URINALYSIS, ROUTINE W REFLEX MICROSCOPIC (NOT AT [facility])  I-STAT CG4 LACTIC ACID, ED  I-STAT CG4 LACTIC ACID, ED  I-STAT CG4 LACTIC ACID, ED    Imaging Review Dg Chest 2 View  11/21/2015  CLINICAL DATA:  Worsening productive cough. EXAM: CHEST  2 VIEW COMPARISON:  08/12/2014 FINDINGS: There is calcified left pleural plaque. There is no focal parenchymal opacity. There is no pleural effusion or pneumothorax. The heart and mediastinal contours are unremarkable. The osseous structures are unremarkable. IMPRESSION: No active cardiopulmonary disease. Electronically Signed   By: Kathreen Devoid   On: 11/21/2015 10:44   I have personally reviewed and evaluated these images and lab results as part of my medical decision-making.   EKG Interpretation None      MDM   Final diagnoses:  Fever,  unspecified fever cause  Viral syndrome  Sinusitis, unspecified chronicity, unspecified location    Temperature improved. No abnormalities on chest x-ray. No leukocytosis. No additional surge criteria other than initial temperature. Not tachycardic, not hypotensive. Normal mental status. Recheck afebrile. Initial lactic acid 2.27, recheck 1.5. Normal urinalysis. Think is appropriate for discharge home. Careful return precautions. With his headache and cough and initial congestion plan 7 days Ceftin Tessalon for cough primary care follow-up.    Tanna Furry, MD 11/21/15 [phone]

## 2015-11-22 ENCOUNTER — Emergency Department (HOSPITAL_COMMUNITY): Payer: Medicare Other

## 2015-11-22 ENCOUNTER — Inpatient Hospital Stay (HOSPITAL_COMMUNITY)
Admission: EM | Admit: 2015-11-22 | Discharge: 2015-11-26 | DRG: 871 | Disposition: A | Discharge status: Home Health | Payer: Medicare Other | Attending: Internal Medicine | Admitting: Internal Medicine

## 2015-11-22 ENCOUNTER — Encounter (HOSPITAL_COMMUNITY): Payer: Self-pay | Admitting: *Deleted

## 2015-11-22 DIAGNOSIS — D72819 Decreased white blood cell count, unspecified: Secondary | ICD-10-CM | POA: Diagnosis present

## 2015-11-22 DIAGNOSIS — A09 Infectious gastroenteritis and colitis, unspecified: Secondary | ICD-10-CM | POA: Diagnosis present

## 2015-11-22 DIAGNOSIS — A419 Sepsis, unspecified organism: Secondary | ICD-10-CM | POA: Diagnosis not present

## 2015-11-22 DIAGNOSIS — I2510 Atherosclerotic heart disease of native coronary artery without angina pectoris: Secondary | ICD-10-CM | POA: Diagnosis present

## 2015-11-22 DIAGNOSIS — I129 Hypertensive chronic kidney disease with stage 1 through stage 4 chronic kidney disease, or unspecified chronic kidney disease: Secondary | ICD-10-CM | POA: Diagnosis present

## 2015-11-22 DIAGNOSIS — E43 Unspecified severe protein-calorie malnutrition: Secondary | ICD-10-CM | POA: Diagnosis present

## 2015-11-22 DIAGNOSIS — R197 Diarrhea, unspecified: Secondary | ICD-10-CM | POA: Diagnosis present

## 2015-11-22 DIAGNOSIS — Z87891 Personal history of nicotine dependence: Secondary | ICD-10-CM

## 2015-11-22 DIAGNOSIS — N400 Enlarged prostate without lower urinary tract symptoms: Secondary | ICD-10-CM | POA: Diagnosis not present

## 2015-11-22 DIAGNOSIS — D631 Anemia in chronic kidney disease: Secondary | ICD-10-CM | POA: Diagnosis present

## 2015-11-22 DIAGNOSIS — N183 Chronic kidney disease, stage 3 unspecified: Secondary | ICD-10-CM | POA: Diagnosis present

## 2015-11-22 DIAGNOSIS — Z6826 Body mass index (BMI) 26.0-26.9, adult: Secondary | ICD-10-CM | POA: Diagnosis not present

## 2015-11-22 DIAGNOSIS — R0682 Tachypnea, not elsewhere classified: Secondary | ICD-10-CM | POA: Diagnosis present

## 2015-11-22 DIAGNOSIS — E860 Dehydration: Secondary | ICD-10-CM | POA: Diagnosis present

## 2015-11-22 DIAGNOSIS — I10 Essential (primary) hypertension: Secondary | ICD-10-CM | POA: Diagnosis not present

## 2015-11-22 DIAGNOSIS — Z7982 Long term (current) use of aspirin: Secondary | ICD-10-CM | POA: Diagnosis not present

## 2015-11-22 DIAGNOSIS — R531 Weakness: Secondary | ICD-10-CM | POA: Diagnosis not present

## 2015-11-22 DIAGNOSIS — E785 Hyperlipidemia, unspecified: Secondary | ICD-10-CM | POA: Diagnosis present

## 2015-11-22 DIAGNOSIS — Z9049 Acquired absence of other specified parts of digestive tract: Secondary | ICD-10-CM

## 2015-11-22 LAB — CBC WITH DIFFERENTIAL/PLATELET
BASOS PCT: 0 %
Basophils Absolute: 0 10*3/uL (ref 0.0–0.1)
EOS ABS: 0 10*3/uL (ref 0.0–0.7)
Eosinophils Relative: 1 %
HCT: 40.3 % (ref 39.0–52.0)
HEMOGLOBIN: 13.6 g/dL (ref 13.0–17.0)
Lymphocytes Relative: 9 %
Lymphs Abs: 0.3 10*3/uL — ABNORMAL LOW (ref 0.7–4.0)
MCH: 33.4 pg (ref 26.0–34.0)
MCHC: 33.7 g/dL (ref 30.0–36.0)
MCV: 99 fL (ref 78.0–100.0)
MONO ABS: 0.4 10*3/uL (ref 0.1–1.0)
MONOS PCT: 10 %
NEUTROS PCT: 80 %
Neutro Abs: 2.9 10*3/uL (ref 1.7–7.7)
PLATELETS: 164 10*3/uL (ref 150–400)
RBC: 4.07 MIL/uL — ABNORMAL LOW (ref 4.22–5.81)
RDW: 12.9 % (ref 11.5–15.5)
WBC: 3.7 10*3/uL — ABNORMAL LOW (ref 4.0–10.5)

## 2015-11-22 LAB — COMPREHENSIVE METABOLIC PANEL
ALBUMIN: 3.7 g/dL (ref 3.5–5.0)
ALT: 15 U/L — ABNORMAL LOW (ref 17–63)
ANION GAP: 9 (ref 5–15)
AST: 31 U/L (ref 15–41)
Alkaline Phosphatase: 70 U/L (ref 38–126)
BUN: 24 mg/dL — AB (ref 6–20)
CALCIUM: 8.7 mg/dL — AB (ref 8.9–10.3)
CHLORIDE: 104 mmol/L (ref 101–111)
CO2: 24 mmol/L (ref 22–32)
Creatinine, Ser: 1.41 mg/dL — ABNORMAL HIGH (ref 0.61–1.24)
GFR calc Af Amer: 50 mL/min — ABNORMAL LOW (ref >60)
GFR calc non Af Amer: 43 mL/min — ABNORMAL LOW (ref >60)
GLUCOSE: 99 mg/dL (ref 65–99)
POTASSIUM: 4 mmol/L (ref 3.5–5.1)
SODIUM: 137 mmol/L (ref 135–145)
TOTAL PROTEIN: 7.1 g/dL (ref 6.5–8.1)
Total Bilirubin: 0.8 mg/dL (ref 0.3–1.2)

## 2015-11-22 LAB — URINALYSIS, ROUTINE W REFLEX MICROSCOPIC
BILIRUBIN URINE: NEGATIVE
Glucose, UA: NEGATIVE mg/dL
HGB URINE DIPSTICK: NEGATIVE
Ketones, ur: NEGATIVE mg/dL
Leukocytes, UA: NEGATIVE
NITRITE: NEGATIVE
PH: 5 (ref 5.0–8.0)
Protein, ur: NEGATIVE mg/dL
SPECIFIC GRAVITY, URINE: 1.023 (ref 1.005–1.030)

## 2015-11-22 LAB — I-STAT CG4 LACTIC ACID, ED
LACTIC ACID, VENOUS: 0.97 mmol/L (ref 0.5–2.0)
Lactic Acid, Venous: 1.37 mmol/L (ref 0.5–2.0)

## 2015-11-22 LAB — LACTIC ACID, PLASMA
Lactic Acid, Venous: 1.5 mmol/L (ref 0.5–2.0)
Lactic Acid, Venous: 1 mmol/L (ref 0.5–2.0)

## 2015-11-22 LAB — TSH: TSH: 0.999 u[IU]/mL (ref 0.350–4.500)

## 2015-11-22 LAB — LIPASE, BLOOD: Lipase: 29 U/L (ref 11–51)

## 2015-11-22 LAB — PROCALCITONIN: PROCALCITONIN: 0.15 ng/mL

## 2015-11-22 MED ORDER — PRAVASTATIN SODIUM 40 MG PO TABS
40.0000 mg | ORAL_TABLET | Freq: Every day | ORAL | Status: DC
  Administered 2015-11-22 – 2015-11-26 (×5): 40 mg via ORAL
  Filled 2015-11-22 (×5): qty 1

## 2015-11-22 MED ORDER — SODIUM CHLORIDE 0.9 % IV SOLN
INTRAVENOUS | Status: DC
  Administered 2015-11-22 – 2015-11-24 (×4): via INTRAVENOUS

## 2015-11-22 MED ORDER — IOHEXOL 300 MG/ML  SOLN
80.0000 mL | Freq: Once | INTRAMUSCULAR | Status: AC | PRN
  Administered 2015-11-22: 80 mL via INTRAVENOUS

## 2015-11-22 MED ORDER — METRONIDAZOLE IN NACL 5-0.79 MG/ML-% IV SOLN
500.0000 mg | Freq: Three times a day (TID) | INTRAVENOUS | Status: DC
  Administered 2015-11-22 – 2015-11-25 (×9): 500 mg via INTRAVENOUS
  Filled 2015-11-22 (×11): qty 100

## 2015-11-22 MED ORDER — CEFUROXIME AXETIL 250 MG PO TABS
250.0000 mg | ORAL_TABLET | Freq: Two times a day (BID) | ORAL | Status: DC
Start: 2015-11-22 — End: 2015-11-25
  Administered 2015-11-22 – 2015-11-25 (×6): 250 mg via ORAL
  Filled 2015-11-22 (×7): qty 1

## 2015-11-22 MED ORDER — IOHEXOL 300 MG/ML  SOLN
50.0000 mL | Freq: Once | INTRAMUSCULAR | Status: DC | PRN
  Administered 2015-11-22: 50 mL via ORAL
  Filled 2015-11-22: qty 50

## 2015-11-22 MED ORDER — AMLODIPINE BESYLATE 5 MG PO TABS
5.0000 mg | ORAL_TABLET | Freq: Every day | ORAL | Status: DC
  Administered 2015-11-22 – 2015-11-26 (×5): 5 mg via ORAL
  Filled 2015-11-22 (×5): qty 1

## 2015-11-22 MED ORDER — OMEGA-3-ACID ETHYL ESTERS 1 G PO CAPS
1.0000 g | ORAL_CAPSULE | Freq: Every day | ORAL | Status: DC
  Administered 2015-11-22 – 2015-11-26 (×5): 1 g via ORAL
  Filled 2015-11-22 (×5): qty 1

## 2015-11-22 MED ORDER — SODIUM CHLORIDE 0.9 % IV BOLUS (SEPSIS)
1000.0000 mL | Freq: Once | INTRAVENOUS | Status: AC
  Administered 2015-11-22: 1000 mL via INTRAVENOUS

## 2015-11-22 MED ORDER — OXYBUTYNIN CHLORIDE 5 MG PO TABS
5.0000 mg | ORAL_TABLET | Freq: Three times a day (TID) | ORAL | Status: DC
  Administered 2015-11-22 – 2015-11-26 (×11): 5 mg via ORAL
  Filled 2015-11-22 (×17): qty 1

## 2015-11-22 MED ORDER — ONDANSETRON HCL 4 MG/2ML IJ SOLN: 4.0000 mg | Freq: Four times a day (QID) | INTRAMUSCULAR | Status: DC | PRN

## 2015-11-22 MED ORDER — TIMOLOL MALEATE 0.5 % OP SOLN
1.0000 [drp] | Freq: Every day | OPHTHALMIC | Status: DC
  Administered 2015-11-22 – 2015-11-26 (×5): 1 [drp] via OPHTHALMIC
  Filled 2015-11-22: qty 5

## 2015-11-22 MED ORDER — ASPIRIN EC 81 MG PO TBEC
81.0000 mg | DELAYED_RELEASE_TABLET | Freq: Every day | ORAL | Status: DC
  Administered 2015-11-22 – 2015-11-26 (×5): 81 mg via ORAL
  Filled 2015-11-22 (×5): qty 1

## 2015-11-22 MED ORDER — TIMOLOL HEMIHYDRATE 0.5 % OP SOLN: 1.0000 [drp] | Freq: Every day | OPHTHALMIC | Status: DC

## 2015-11-22 MED ORDER — IOHEXOL 350 MG/ML SOLN: 150.0000 mL | Freq: Once | INTRAVENOUS | Status: DC | PRN

## 2015-11-22 MED ORDER — TAMSULOSIN HCL 0.4 MG PO CAPS
0.4000 mg | ORAL_CAPSULE | Freq: Every day | ORAL | Status: DC
  Administered 2015-11-22 – 2015-11-26 (×5): 0.4 mg via ORAL
  Filled 2015-11-22 (×5): qty 1

## 2015-11-22 MED ORDER — ACETAMINOPHEN 650 MG RE SUPP: 650.0000 mg | Freq: Four times a day (QID) | RECTAL | Status: DC | PRN

## 2015-11-22 MED ORDER — ACETAMINOPHEN 325 MG PO TABS
650.0000 mg | ORAL_TABLET | Freq: Once | ORAL | Status: AC
  Administered 2015-11-22: 650 mg via ORAL
  Filled 2015-11-22: qty 2

## 2015-11-22 MED ORDER — TRAMADOL HCL 50 MG PO TABS: 50.0000 mg | ORAL_TABLET | Freq: Four times a day (QID) | ORAL | Status: DC | PRN

## 2015-11-22 MED ORDER — ACETAMINOPHEN 325 MG PO TABS
650.0000 mg | ORAL_TABLET | Freq: Four times a day (QID) | ORAL | Status: DC | PRN
  Administered 2015-11-22 – 2015-11-24 (×4): 650 mg via ORAL
  Filled 2015-11-22 (×4): qty 2

## 2015-11-22 MED ORDER — ADULT MULTIVITAMIN W/MINERALS CH
1.0000 | ORAL_TABLET | Freq: Every day | ORAL | Status: DC
  Administered 2015-11-22 – 2015-11-26 (×5): 1 via ORAL
  Filled 2015-11-22 (×5): qty 1

## 2015-11-22 MED ORDER — ONDANSETRON HCL 4 MG PO TABS: 4.0000 mg | ORAL_TABLET | Freq: Four times a day (QID) | ORAL | Status: DC | PRN

## 2015-11-22 MED ORDER — KETOCONAZOLE 2 % EX CREA
1.0000 "application " | TOPICAL_CREAM | Freq: Two times a day (BID) | CUTANEOUS | Status: DC
  Administered 2015-11-22 – 2015-11-26 (×8): 1 via TOPICAL
  Filled 2015-11-22: qty 15

## 2015-11-22 MED ORDER — BENZONATATE 100 MG PO CAPS
100.0000 mg | ORAL_CAPSULE | Freq: Three times a day (TID) | ORAL | Status: DC
  Administered 2015-11-22 – 2015-11-26 (×12): 100 mg via ORAL
  Filled 2015-11-22 (×15): qty 1

## 2015-11-22 NOTE — ED Notes (Addendum)
Pt brought in by daughter due to pt having diarrhea all night and feeling weak. Pt was seen yesterday for same, but daughter states the pt feels weaker and began having diarrhea last night. Pt states he has not wanted to eat or drink since being discharged yesterday. Pt's daughter states she wants pt to be admitted to get fluids. Pt daughter states the pt's tremors have also gotten worse since yesterday. Daughter states she is worried the pt will fall at home, pt does not have home health and lives alone.

## 2015-11-22 NOTE — ED Notes (Signed)
Patient aware of need for urine. 

## 2015-11-22 NOTE — ED Provider Notes (Signed)
CSN: [id number]     Arrival date & time 11/22/15  1125 History   First MD Initiated Contact with Patient 11/22/15 1233     Chief Complaint  Patient presents with  . Diarrhea  . Weakness     (Consider location/radiation/quality/duration/timing/severity/associated sxs/prior Treatment) HPI   Wayne [name] is a 81 y.o. male  who presents to the Emergency Department complaining of generalized weakness, congestion x 1 week. Patient seen in ED yesterday for similar symptoms. After being discharged yesterday, patient admits to diarrhea, generalized abdominal pain, which he has not had previously, as well as fevers at home. Daughter-in-law states he has not been eating or drinking. Patient lives at home alone. Amoxicillin given by PCP 1 week ago. Cefzil started yesterday, first dose this morning.    History reviewed. No pertinent past medical history. Past Surgical History  Procedure Laterality Date  . Ballon angioplasty  03/18/2012  . Knee surgery Left x 2   . Back surgery    . Right wrist    . Left ankle    . Appendectomy    . Left heart catheterization with coronary angiogram N/A 03/18/2012    Procedure: LEFT HEART CATHETERIZATION WITH CORONARY ANGIOGRAM;  Surgeon: Laverda Page, MD;  Location: [facility] CATH LAB;  Service: Cardiovascular;  Laterality: N/A;  . Renal angiogram N/A 03/18/2012    Procedure: RENAL ANGIOGRAM;  Surgeon: Laverda Page, MD;  Location: [facility] CATH LAB;  Service: Cardiovascular;  Laterality: N/A;  . Kidney stent Left 2015   Family History  Problem Relation Age of Onset  . Dementia Mother   . Cancer Father    Social History  Substance Use Topics  . Smoking status: Former Smoker    Quit date: 06/02/1972  . Smokeless tobacco: Never Used  . Alcohol Use: Yes     Comment: rare    Review of Systems  Constitutional: Positive for fever, appetite change and fatigue. Negative for unexpected weight change.  HENT: Positive for congestion and sinus pressure.   Eyes:  Negative for visual disturbance.  Respiratory: Positive for cough. Negative for shortness of breath.   Cardiovascular: Negative.   Gastrointestinal: Positive for abdominal pain and diarrhea. Negative for nausea, vomiting and constipation.  Genitourinary: Negative for difficulty urinating.  Musculoskeletal: Negative for myalgias, arthralgias and neck pain.  Skin: Negative for rash.  Neurological: Positive for weakness. Negative for dizziness and headaches.      Allergies  Review of patient's allergies indicates no known allergies.  Home Medications   Prior to Admission medications   Medication Sig Start Date End Date Taking? Authorizing Provider  amLODipine (NORVASC) 5 MG tablet Take 5 mg by mouth daily.    Yes Historical Provider, MD  aspirin EC 81 MG tablet Take 81 mg by mouth daily.   Yes Historical Provider, MD  benzonatate (TESSALON) 100 MG capsule Take 1 capsule (100 mg total) by mouth every 8 (eight) hours. 11/21/15  Yes Tanna Furry, MD  cefPROZIL (CEFZIL) 500 MG tablet Take 1 tablet (500 mg total) by mouth 2 (two) times daily. 11/21/15  Yes Tanna Furry, MD  ketoconazole (NIZORAL) 2 % cream Apply 1 application topically 2 (two) times daily. Applies to face. 11/17/15  Yes Historical Provider, MD  Multiple Vitamin (MULTIVITAMIN WITH MINERALS) TABS tablet Take 1 tablet by mouth daily.   Yes Historical Provider, MD  olmesartan-hydrochlorothiazide (BENICAR HCT) 40-12.5 MG per tablet Take 1 tablet by mouth daily.   Yes Historical Provider, MD  Omega-3 Fatty Acids (FISH OIL)  1200 MG CAPS Take 1 capsule by mouth 2 (two) times daily.   Yes Historical Provider, MD  omeprazole (PRILOSEC) 40 MG capsule Take 40 mg by mouth daily.   Yes Historical Provider, MD  oxybutynin (DITROPAN) 5 MG tablet Take 5 mg by mouth 3 (three) times daily.   Yes Historical Provider, MD  pravastatin (PRAVACHOL) 40 MG tablet Take 40 mg by mouth daily.   Yes Historical Provider, MD  tamsulosin (FLOMAX) 0.4 MG CAPS  capsule Take 0.4 mg by mouth daily.  05/03/15  Yes Historical Provider, MD  timolol (BETIMOL) 0.5 % ophthalmic solution Place 1 drop into both eyes daily.    Yes Historical Provider, MD  traMADol (ULTRAM) 50 MG tablet TAKE 1 TABLET BY MOUTH EVERY 6 HOURS AS NEEDED FOR PAIN. MAX 4 TABS PER DAY 05/25/15  Yes Historical Provider, MD  Vitamin D, Ergocalciferol, (DRISDOL) 50000 UNITS CAPS capsule Take 50,000 Units by mouth every 7 (seven) days. Takes on Monday   Yes Historical Provider, MD  amoxicillin (AMOXIL) 500 MG capsule Take 500 mg by mouth 2 (two) times daily. Reported on 11/22/2015 11/15/15   Historical Provider, MD  Naftifine HCl 2 % CREA Apply to affected area twice daily for 3-4 weeks as instructed Patient not taking: Reported on 06/03/2015 06/15/14   Harriet Masson, DPM   BP 127/61 mmHg  Pulse 60  Temp(Src) 99 F (37.2 C) (Oral)  Resp 16  SpO2 96% Physical Exam  Constitutional: He is oriented to person, place, and time. He appears well-developed and well-nourished.  Alert and in no acute distress  HENT:  Head: Normocephalic and atraumatic.  Cardiovascular: Normal rate, regular rhythm, normal heart sounds and intact distal pulses.  Exam reveals no gallop and no friction rub.   No murmur heard. Pulmonary/Chest: Effort normal and breath sounds normal. No respiratory distress. He has no wheezes. He has no rales. He exhibits no tenderness.  Abdominal: He exhibits no mass. There is no rebound and no guarding.  Abdomen soft, non-distended Generalized abdominal tenderness Bowel sounds positive in all four quadrants  Musculoskeletal: He exhibits no edema.  Neurological: He is alert and oriented to person, place, and time.  Skin: Skin is warm and dry. No rash noted.  Psychiatric: He has a normal mood and affect. His behavior is normal. Judgment and thought content normal.  Nursing note and vitals reviewed.   ED Course  Procedures (including critical care time) Labs Review Labs Reviewed   CBC WITH DIFFERENTIAL/PLATELET - Abnormal; Notable for the following:    WBC 3.7 (*)    RBC 4.07 (*)    Lymphs Abs 0.3 (*)    All other components within normal limits  COMPREHENSIVE METABOLIC PANEL - Abnormal; Notable for the following:    BUN 24 (*)    Creatinine, Ser 1.41 (*)    Calcium 8.7 (*)    ALT 15 (*)    GFR calc non Af Amer 43 (*)    GFR calc Af Amer 50 (*)    All other components within normal limits  CULTURE, BLOOD (ROUTINE X 2)  CULTURE, BLOOD (ROUTINE X 2)  URINE CULTURE  LIPASE, BLOOD  URINALYSIS, ROUTINE W REFLEX MICROSCOPIC (NOT AT [facility])  I-STAT CG4 LACTIC ACID, ED  I-STAT CG4 LACTIC ACID, ED    Imaging Review Dg Chest 2 View  11/21/2015  CLINICAL DATA:  Worsening productive cough. EXAM: CHEST  2 VIEW COMPARISON:  08/12/2014 FINDINGS: There is calcified left pleural plaque. There is no focal parenchymal opacity.  There is no pleural effusion or pneumothorax. The heart and mediastinal contours are unremarkable. The osseous structures are unremarkable. IMPRESSION: No active cardiopulmonary disease. Electronically Signed   By: Kathreen Devoid   On: 11/21/2015 10:44   Ct Abdomen Pelvis W Contrast  11/22/2015  CLINICAL DATA:  Diarrhea during the night, feeling weak, upper abdominal pain EXAM: CT ABDOMEN AND PELVIS WITH CONTRAST TECHNIQUE: Multidetector CT imaging of the abdomen and pelvis was performed using the standard protocol following bolus administration of intravenous contrast. CONTRAST:  61mL OMNIPAQUE IOHEXOL 300 MG/ML SOLN, 25mL OMNIPAQUE IOHEXOL 300 MG/ML SOLN COMPARISON:  None. FINDINGS: Calcified pleural plaques are noted in left lower lobe anterolaterally. Small hiatal hernia. Sagittal images of the spine shows extensive degenerative changes lumbar spine. Enhanced liver is unremarkable. No calcified gallstones are noted within gallbladder. A right renal artery stent is noted. No aortic aneurysm. Small umbilical hernia containing fat without evidence of acute  complication. The pancreas, spleen and right adrenal is unremarkable. Probable left adrenal adenoma measures 1 cm. Kidneys are symmetrical in size and enhancement. Mild renal cortical thinning probable due to atrophy. No hydronephrosis or hydroureter. Delayed renal images shows bilateral renal symmetrical excretion. Bilateral visualized ureter is unremarkable. There is no small bowel obstruction. No ascites or free air. No adenopathy. No pericecal inflammation. The terminal ileum is unremarkable. The patient is status post appendectomy. Scattered diverticula are noted descending colon. Multiple sigmoid colon diverticula. There is no evidence of acute diverticulitis. The sigmoid colon is empty the rectum is empty. Mild enlarged prostate gland with indentation of urinary bladder base. Prostate gland measures 6.4 by 4.5 cm. A central calcification within prostate gland measures 5 mm. No calcified calculi are noted within urinary bladder. There is no inguinal adenopathy. No destructive bony lesions are noted within pelvis. IMPRESSION: 1. No acute inflammatory process within abdomen or pelvis. 2. Small hiatal hernia. 3. Status post appendectomy.  No pericecal inflammation. 4. Mild bilateral renal cortical thinning probable due to atrophy. No hydronephrosis or hydroureter. Bilateral renal symmetrical excretion. 5. No small bowel obstruction. 6. Colonic diverticula are noted descending colon and sigmoid colon. No evidence of acute diverticulitis. 7. Mild enlarged prostate gland with indentation of urinary bladder base. A central calcification within prostate gland measures 5 mm. 8. Degenerative changes lumbar spine. 9. Probable left adrenal  adenoma measures 1 cm. Electronically Signed   By: Lahoma Crocker M.D.   On: 11/22/2015 16:12   I have personally reviewed and evaluated these images and lab results as part of my medical decision-making.   EKG Interpretation None      MDM   Final diagnoses:  Diarrhea,  unspecified type  Weakness   Wayne [name] presents with generalized weakness, abdominal pain, diarrhea  Labs: UA, CBC, CMP, lipase, lactic acid; blood cultures x2 ordered Imaging: CT - see above - no acute inflammatory process, no diverticulitis   A&P: Admit to medicine for dehydration, observation. Patient with persistent diarrhea and decreased PO intake. Lives alone. Family concerned he will not be safe at home. High fall risk   Patient seen by and discussed with Dr. Darl Householder who agrees with treatment plan.    [facility] Arland Usery, PA-C 11/22/15 1653  Wandra Arthurs, MD 11/25/15 [phone]

## 2015-11-22 NOTE — H&P (Signed)
[facility] History and Physical  Wayne [name] DTO:[id number] DOB: 04-05-1926 DOA: 11/22/2015  Referring physician: ER physician: Dr. Amie Portland PCP: [name], MD  Chief Complaint: diarrhea   HPI:  81 year old male with past medical history of CKD stage 3, CAD, dyslipidemia, hypertension who presented to [facility] ED with reports of diarrhea that started today. He is on cefzil given to him from ED yesterday (but he was on amoxicillin before that for sinusitis. He presented to ED 12/19 but was subsequently discharged home as CXR was normal and he felt better. Since coming home he just continues to feel week, poor po intake, lethargic. He did not have nausea or vomiting. Diarrhea is non bloody. No associated abdominal pain. No chest pain, shortness of breath or palpitations. He had fevers at home, chills. No lightheadedness or loss of consciousness.  In ED, BP was 120/72, HR 84, RR 20, T max 101.2 F, oxygen saturation 97% on room air. Blood work showed WBC count 3.7, Creatinine 1.4 (baseline 2.4). Since he had ongoing diarrhea and family was concerned about more GI losses and his poor PO intake we admitted for hydration and to rule out C.diff.  Assessment & Plan    Principal Problem:   Sepsis ([place]) / Diarrhea presumed infectious etiology / Leukopenia  - Sepsis criteria met on admission with fever, tachypnea, leukopenia. Source of infection presumed to be possible C.diff considering recent abx use. Follow up blood cultures, lactic acid, procalcitonin level  - Sepsis work up initiated - Stool sent for C. diff - Started empiric flagyl - Continue IV fluids for hydration  Active Problems:   Dehydration - In the setting of GI losses - Continue IV fluids    Coronary artery disease involving native coronary artery without angina pectoris - Continue aspirin - No chest pain     Hyperlipidemia - Resume Pravachol and omega 3     Protein-calorie malnutrition, severe (HCC) - Likely  in the setting of acute illness - Nutrition consulted    CKD (chronic kidney disease) stage 3, GFR 30-59 ml/min - Baseline creatinine in 04/2014 was 2.4 and on this admission 1.41, stable    Benign essential HTN - BP 127/61 - Hold benicar but resume Norvasc since BP stable  DVT prophylaxis:  - SCD's bilaterally   Radiological Exams on Admission: Dg Chest 2 View 11/21/2015 No active cardiopulmonary disease. Electronically Signed   By: Kathreen Devoid   On: 11/21/2015 10:44   Ct Abdomen Pelvis W Contrast 11/22/2015  1. No acute inflammatory process within abdomen or pelvis. 2. Small hiatal hernia. 3. Status post appendectomy.  No pericecal inflammation. 4. Mild bilateral renal cortical thinning probable due to atrophy. No hydronephrosis or hydroureter. Bilateral renal symmetrical excretion. 5. No small bowel obstruction. 6. Colonic diverticula are noted descending colon and sigmoid colon. No evidence of acute diverticulitis. 7. Mild enlarged prostate gland with indentation of urinary bladder base. A central calcification within prostate gland measures 5 mm. 8. Degenerative changes lumbar spine. 9. Probable left adrenal  adenoma measures 1 cm. Electronically Signed   By: Lahoma Crocker M.D.   On: 11/22/2015 16:12    Code Status: Full Family Communication: Plan of care discussed with the patient  Disposition Plan: Admit for further evaluation, medical floor   [name], MD  [facility] Pager [phone]  Time spent in minutes: 75 minutes  Review of Systems:  Constitutional: Negative for fever, chills and malaise/fatigue. Negative for diaphoresis.  HENT: Negative for hearing loss,  ear pain, nosebleeds, congestion, sore throat, neck pain, tinnitus and ear discharge.   Eyes: Negative for blurred vision, double vision, photophobia, pain, discharge and redness.  Respiratory: Negative for cough, hemoptysis, sputum production, shortness of breath, wheezing and stridor.   Cardiovascular:  Negative for chest pain, palpitations, orthopnea, claudication and leg swelling.  Gastrointestinal: per HPI Genitourinary: Negative for dysuria, urgency, frequency, hematuria and flank pain.  Musculoskeletal: Negative for myalgias, back pain, joint pain and falls.  Skin: Negative for itching and rash.  Neurological: Negative for dizziness and weakness. Negative for tingling, tremors, sensory change, speech change, focal weakness, loss of consciousness and headaches.  Endo/Heme/Allergies: Negative for environmental allergies and polydipsia. Does not bruise/bleed easily.  Psychiatric/Behavioral: Negative for suicidal ideas. The patient is not nervous/anxious.      History reviewed. No pertinent past medical history. Past Surgical History  Procedure Laterality Date  . Ballon angioplasty  03/18/2012  . Knee surgery Left x 2   . Back surgery    . Right wrist    . Left ankle    . Appendectomy    . Left heart catheterization with coronary angiogram N/A 03/18/2012    Procedure: LEFT HEART CATHETERIZATION WITH CORONARY ANGIOGRAM;  Surgeon: Laverda Page, MD;  Location: [facility] CATH LAB;  Service: Cardiovascular;  Laterality: N/A;  . Renal angiogram N/A 03/18/2012    Procedure: RENAL ANGIOGRAM;  Surgeon: Laverda Page, MD;  Location: [facility] CATH LAB;  Service: Cardiovascular;  Laterality: N/A;  . Kidney stent Left 2015   Social History:  reports that he quit smoking about 43 years ago. He has never used smokeless tobacco. He reports that he drinks alcohol. He reports that he does not use illicit drugs.  No Known Allergies  Family History:  Family History  Problem Relation Age of Onset  . Dementia Mother   . Cancer Father      Prior to Admission medications   Medication Sig Start Date End Date Taking? Authorizing Provider  amLODipine (NORVASC) 5 MG tablet Take 5 mg by mouth daily.    Yes Historical Provider, MD  aspirin EC 81 MG tablet Take 81 mg by mouth daily.   Yes Historical Provider, MD   benzonatate (TESSALON) 100 MG capsule Take 1 capsule (100 mg total) by mouth every 8 (eight) hours. 11/21/15  Yes Tanna Furry, MD  cefPROZIL (CEFZIL) 500 MG tablet Take 1 tablet (500 mg total) by mouth 2 (two) times daily. 11/21/15  Yes Tanna Furry, MD  ketoconazole (NIZORAL) 2 % cream Apply 1 application topically 2 (two) times daily. Applies to face. 11/17/15  Yes Historical Provider, MD  Multiple Vitamin (MULTIVITAMIN WITH MINERALS) TABS tablet Take 1 tablet by mouth daily.   Yes Historical Provider, MD  olmesartan-hydrochlorothiazide (BENICAR HCT) 40-12.5 MG per tablet Take 1 tablet by mouth daily.   Yes Historical Provider, MD  Omega-3 Fatty Acids (FISH OIL) 1200 MG CAPS Take 1 capsule by mouth 2 (two) times daily.   Yes Historical Provider, MD  omeprazole (PRILOSEC) 40 MG capsule Take 40 mg by mouth daily.   Yes Historical Provider, MD  oxybutynin (DITROPAN) 5 MG tablet Take 5 mg by mouth 3 (three) times daily.   Yes Historical Provider, MD  pravastatin (PRAVACHOL) 40 MG tablet Take 40 mg by mouth daily.   Yes Historical Provider, MD  tamsulosin (FLOMAX) 0.4 MG CAPS capsule Take 0.4 mg by mouth daily.  05/03/15  Yes Historical Provider, MD  timolol (BETIMOL) 0.5 % ophthalmic solution Place 1 drop into  both eyes daily.    Yes Historical Provider, MD  traMADol (ULTRAM) 50 MG tablet TAKE 1 TABLET BY MOUTH EVERY 6 HOURS AS NEEDED FOR PAIN. MAX 4 TABS PER DAY 05/25/15  Yes Historical Provider, MD  Vitamin D, Ergocalciferol, (DRISDOL) 50000 UNITS CAPS capsule Take 50,000 Units by mouth every 7 (seven) days. Takes on Monday   Yes Historical Provider, MD  amoxicillin (AMOXIL) 500 MG capsule Take 500 mg by mouth 2 (two) times daily. Reported on 11/22/2015 11/15/15   Historical Provider, MD  Naftifine HCl 2 % CREA Apply to affected area twice daily for 3-4 weeks as instructed Patient not taking: Reported on 06/03/2015 06/15/14   Harriet Masson, DPM   Physical Exam: Filed Vitals:   11/22/15 1353 11/22/15  1404 11/22/15 1524 11/22/15 1540  BP: 112/66  127/61   Pulse: 63  60   Temp:  98.5 F (36.9 C)  99 F (37.2 C)  TempSrc:    Oral  Resp: 16  16   SpO2: 97%  96%     Physical Exam  Constitutional: Appears well-developed and well-nourished. No distress.  HENT: Normocephalic. No tonsillar erythema or exudates Eyes: Conjunctivae are normal. No scleral icterus.  Neck: Normal ROM. Neck supple. No JVD. No tracheal deviation. No thyromegaly.  CVS: RRR, S1/S2 +, no murmurs, no gallops, no carotid bruit.  Pulmonary: Effort and breath sounds normal, no stridor, rhonchi, wheezes, rales.  Abdominal: Soft. BS +,  no distension, tenderness, rebound or guarding.  Musculoskeletal: Normal range of motion. No edema and no tenderness.  Lymphadenopathy: No lymphadenopathy noted, cervical, inguinal. Neuro: Alert. Normal reflexes, muscle tone coordination. No focal neurologic deficits. Skin: Skin is warm and dry. No rash noted.  No erythema. No pallor.  Psychiatric: Normal mood and affect. Behavior, judgment, thought content normal.   Labs on Admission:  Basic Metabolic Panel:  Recent Labs Lab 11/21/15 1018 11/22/15 1348  NA 138 137  K 4.0 4.0  CL 103 104  CO2 24 24  GLUCOSE 128* 99  BUN 24* 24*  CREATININE 1.59* 1.41*  CALCIUM 8.9 8.7*   Liver Function Tests:  Recent Labs Lab 11/22/15 1348  AST 31  ALT 15*  ALKPHOS 70  BILITOT 0.8  PROT 7.1  ALBUMIN 3.7    Recent Labs Lab 11/22/15 1348  LIPASE 29   No results for input(s): AMMONIA in the last 168 hours. CBC:  Recent Labs Lab 11/21/15 1018 11/22/15 1348  WBC 4.2 3.7*  NEUTROABS 2.0 2.9  HGB 13.2 13.6  HCT 39.0 40.3  MCV 98.7 99.0  PLT 179 164   Cardiac Enzymes: No results for input(s): CKTOTAL, CKMB, CKMBINDEX, TROPONINI in the last 168 hours. BNP: Invalid input(s): POCBNP CBG: No results for input(s): GLUCAP in the last 168 hours.  If 7PM-7AM, please contact night-coverage [email or web address] Password  [email or web address] 11/22/2015, 5:03 PM

## 2015-11-23 DIAGNOSIS — N183 Chronic kidney disease, stage 3 unspecified: Secondary | ICD-10-CM | POA: Diagnosis present

## 2015-11-23 DIAGNOSIS — D631 Anemia in chronic kidney disease: Secondary | ICD-10-CM | POA: Diagnosis present

## 2015-11-23 LAB — BASIC METABOLIC PANEL
Anion gap: 8 (ref 5–15)
BUN: 24 mg/dL — ABNORMAL HIGH (ref 6–20)
CALCIUM: 8 mg/dL — AB (ref 8.9–10.3)
CHLORIDE: 109 mmol/L (ref 101–111)
CO2: 21 mmol/L — ABNORMAL LOW (ref 22–32)
CREATININE: 1.29 mg/dL — AB (ref 0.61–1.24)
GFR, EST AFRICAN AMERICAN: 56 mL/min — AB (ref >60)
GFR, EST NON AFRICAN AMERICAN: 48 mL/min — AB (ref >60)
Glucose, Bld: 95 mg/dL (ref 65–99)
Potassium: 3.9 mmol/L (ref 3.5–5.1)
SODIUM: 138 mmol/L (ref 135–145)

## 2015-11-23 LAB — URINE CULTURE: Culture: 1000

## 2015-11-23 LAB — CBC
HCT: 32.3 % — ABNORMAL LOW (ref 39.0–52.0)
Hemoglobin: 10.9 g/dL — ABNORMAL LOW (ref 13.0–17.0)
MCH: 33.4 pg (ref 26.0–34.0)
MCHC: 33.7 g/dL (ref 30.0–36.0)
MCV: 99.1 fL (ref 78.0–100.0)
PLATELETS: 147 10*3/uL — AB (ref 150–400)
RBC: 3.26 MIL/uL — AB (ref 4.22–5.81)
RDW: 13 % (ref 11.5–15.5)
WBC: 3.4 10*3/uL — AB (ref 4.0–10.5)

## 2015-11-23 LAB — C DIFFICILE QUICK SCREEN W PCR REFLEX
C DIFFICILE (CDIFF) TOXIN: NEGATIVE
C DIFFICLE (CDIFF) ANTIGEN: NEGATIVE
C Diff interpretation: NEGATIVE

## 2015-11-23 LAB — GLUCOSE, CAPILLARY: Glucose-Capillary: 88 mg/dL (ref 65–99)

## 2015-11-23 MED ORDER — CHLORHEXIDINE GLUCONATE 0.12 % MT SOLN
15.0000 mL | Freq: Two times a day (BID) | OROMUCOSAL | Status: DC
  Administered 2015-11-23 – 2015-11-25 (×4): 15 mL via OROMUCOSAL
  Filled 2015-11-23 (×8): qty 15

## 2015-11-23 MED ORDER — ENSURE ENLIVE PO LIQD
237.0000 mL | Freq: Two times a day (BID) | ORAL | Status: DC
  Administered 2015-11-23 – 2015-11-25 (×2): 237 mL via ORAL

## 2015-11-23 NOTE — Progress Notes (Signed)
Patient ID: Wayne [name], male   DOB: [birth year], 81 y.o.   MRN: [id number] [facility] PROGRESS NOTE  Wayne [name] [id number] DOB: November 07, 1926 DOA: 11/22/2015 PCP: [name], MD  Brief narrative:    81 year old male with past medical history of CKD stage 3, CAD, dyslipidemia, hypertension who presented to [facility] ED with reports of diarrhea started prior to the admission. Pt was recently on amoxicillin for sinusitis. He presented to ED 12/19 for weakness, headache but was subsequently discharged home as CXR was normal and he felt better. He was discharged with cefzil.   In ED, BP was 120/72, HR 84, RR 20, T max 101.2 F, oxygen saturation 97% on room air. Blood work showed WBC count 3.7, Creatinine 1.4 (baseline 2.4). CT abdomen showed no acute intra-abdominal findings. He was admitted for evaluation of diarrhea and for dehydration.   Assessment/Plan:    Principal Problem:  Sepsis ([place]) / Diarrhea presumed infectious etiology / Leukopenia  - Sepsis criteria met on admission with fever, tachypnea, leukopenia. Source of infection thought to be possible C.diff considering recent abx use. Lactic acid was WNL, procalcitonin level was 0.15 - Blood cultures so far show no growth - Stool not yet sent for C. Diff since no further diarrhea since admission  - Started empiric flagyl - Continue IV fluids for hydration   Active Problems:  Dehydration - Continue IV fluids   Coronary artery disease involving native coronary artery without angina pectoris - Continue aspirin   Hyperlipidemia - Continue Pravachol and omega 3    Protein-calorie malnutrition, severe (HCC) - In the setting of acute illness - Nutrition consulted   CKD (chronic kidney disease) stage 3, GFR 30-59 ml/min - Baseline creatinine in 04/2014 was 2.4 and on this admission 1.41    Anemia of chronic renal failure stage 3 - Hemoglobin stable at 10.9   Benign essential HTN - Continue Norvasc daily -  BP stable, 116/50  DVT prophylaxis:  - SCD's bilaterally    Code Status: Full.  Family Communication:  plan of care discussed with the patient Disposition Plan: Home likely 11/25/2015.  IV access:  Peripheral IV  Procedures and diagnostic studies:    Dg Chest 2 View 11/21/2015 No active cardiopulmonary disease. Electronically Signed By: Kathreen Devoid On: 11/21/2015 10:44   Ct Abdomen Pelvis W Contrast 11/22/2015 1. No acute inflammatory process within abdomen or pelvis. 2. Small hiatal hernia. 3. Status post appendectomy. No pericecal inflammation. 4. Mild bilateral renal cortical thinning probable due to atrophy. No hydronephrosis or hydroureter. Bilateral renal symmetrical excretion. 5. No small bowel obstruction. 6. Colonic diverticula are noted descending colon and sigmoid colon. No evidence of acute diverticulitis. 7. Mild enlarged prostate gland with indentation of urinary bladder base. A central calcification within prostate gland measures 5 mm. 8. Degenerative changes lumbar spine. 9. Probable left adrenal adenoma measures 1 cm. Electronically Signed By: Lahoma Crocker M.D. On: 11/22/2015 16:12   Medical Consultants:  None  Other Consultants:  PT Nutrition  IAnti-Infectives:   None    [name], MD  [facility] Pager [phone]  Time spent in minutes: 25 minutes  If 7PM-7AM, please contact night-coverage [email or web address] Password [email or web address] 11/23/2015, 11:59 AM   LOS: 1 day    HPI/Subjective: No acute overnight events. Patient reports he ate better yesterday and this am. No BM since admission.  Objective: Filed Vitals:   11/22/15 1753 11/23/15 0119 11/23/15 0551 11/23/15 0718  BP: 138/59 107/48 116/50   Pulse: 58 61 49  Temp: 97.4 F (36.3 C) 99.4 F (37.4 C) 99 F (37.2 C)   TempSrc: Oral Oral    Resp: _0 Weight:    75.5 kg (166 lb 7.2 oz)  SpO2: 99% 96% 97%     Intake/Output Summary (Last 24 hours) at 11/23/15 1159 Last data filed  at 11/23/15 0858  Gross per 24 hour  Intake 973.75 ml  Output    450 ml  Net 523.75 ml    Exam:   General:  Pt is alert, follows commands appropriately, not in acute distress  Cardiovascular: Regular rate and rhythm, S1/S2 (+)  Respiratory: Clear to auscultation bilaterally, no wheezing, no crackles, no rhonchi  Abdomen: Soft, non tender, non distended, bowel sounds present  Extremities: No edema, pulses DP and PT palpable bilaterally  Neuro: Grossly nonfocal  Data Reviewed: Basic Metabolic Panel:  Recent Labs Lab 11/21/15 1018 11/22/15 1348 11/23/15 0517  NA 138 137 138  K 4.0 4.0 3.9  CL 103 104 109  CO2 24 24 21*  GLUCOSE 128* 99 95  BUN 24* 24* 24*  CREATININE 1.59* 1.41* 1.29*  CALCIUM 8.9 8.7* 8.0*   Liver Function Tests:  Recent Labs Lab 11/22/15 1348  AST 31  ALT 15*  ALKPHOS 70  BILITOT 0.8  PROT 7.1  ALBUMIN 3.7    Recent Labs Lab 11/22/15 1348  LIPASE 29   No results for input(s): AMMONIA in the last 168 hours. CBC:  Recent Labs Lab 11/21/15 1018 11/22/15 1348 11/23/15 0517  WBC 4.2 3.7* 3.4*  NEUTROABS 2.0 2.9  --   HGB 13.2 13.6 10.9*  HCT 39.0 40.3 32.3*  MCV 98.7 99.0 99.1  PLT 179 164 147*   Cardiac Enzymes: No results for input(s): CKTOTAL, CKMB, CKMBINDEX, TROPONINI in the last 168 hours. BNP: Invalid input(s): POCBNP CBG:  Recent Labs Lab 11/23/15 0803  GLUCAP 88    Recent Results (from the past 240 hour(s))  Blood culture (routine x 2)     Status: None (Preliminary result)   Collection Time: 11/22/15  1:36 PM  Result Value Ref Range Status   Specimen Description BLOOD RIGHT ANTECUBITAL  Final   Special Requests BOTTLES DRAWN AEROBIC AND ANAEROBIC 5CC EACH  Final   Culture   Final    NO GROWTH < 24 HOURS Performed at [facility]    Report Status PENDING  Incomplete  Blood culture (routine x 2)     Status: None (Preliminary result)   Collection Time: 11/22/15  2:00 PM  Result Value Ref Range  Status   Specimen Description BLOOD LEFT ANTECUBITAL  Final   Special Requests BOTTLES DRAWN AEROBIC AND ANAEROBIC 5CC EACH  Final   Culture   Final    NO GROWTH < 24 HOURS Performed at [facility]    Report Status PENDING  Incomplete     Scheduled Meds: . amLODipine  5 mg Oral Daily  . aspirin EC  81 mg Oral Daily  . benzonatate  100 mg Oral Q8H  . cefUROXime  250 mg Oral BID  . ketoconazole  1 application Topical BID  . metronidazole  500 mg Intravenous Q8H  . multivitamin with minerals  1 tablet Oral Daily  . omega-3 acid ethyl esters  1 g Oral Daily  . oxybutynin  5 mg Oral TID  . pravastatin  40 mg Oral Daily  . tamsulosin  0.4 mg Oral Daily  . timolol  1 drop Both Eyes Daily  Continuous Infusions: . sodium chloride 75 mL/hr at 11/23/15 0810

## 2015-11-23 NOTE — Care Management Note (Signed)
Case Management Note  Patient Details  Name: Wayne [name] MRN: [id number] Date of Birth: [birth year]  Subjective/Objective:   80 y/o m admitted w/Sepsis. From home, has rw. PT cons-await recc.                 Action/Plan:d/c plan home.   Expected Discharge Date:   (unknown)               Expected Discharge Plan:  [place]  In-House Referral:  Clinical Social Work  Discharge planning Services  CM Consult  Post Acute Care Choice:    Choice offered to:     DME Arranged:    DME Agency:     HH Arranged:    [place] Agency:     Status of Service:  In process, will continue to follow  Medicare Important Message Given:    Date Medicare IM Given:    Medicare IM give by:    Date Additional Medicare IM Given:    Additional Medicare Important Message give by:     If discussed at [place] of Stay Meetings, dates discussed:    Additional Comments:  Dessa Phi, RN 11/23/2015, 2:41 PM

## 2015-11-23 NOTE — Progress Notes (Signed)
Initial Nutrition Assessment  DOCUMENTATION CODES:   Not applicable  INTERVENTION:  -Ensure Enlive po BID, each supplement provides 350 kcal and 20 grams of protein  NUTRITION DIAGNOSIS:   Inadequate oral intake related to poor appetite as evidenced by per patient/family report.  GOAL:   Patient will meet greater than or equal to 90% of their needs  MONITOR:   PO intake, Supplement acceptance, Labs, I & O's  REASON FOR ASSESSMENT:   Consult Assessment of nutrition requirement/status  ASSESSMENT:   81 year old male with past medical history of CKD stage 3, CAD, dyslipidemia, hypertension who presented to [facility] ED with reports of diarrhea that started today. He is on cefzil given to him from ED yesterday (but he was on amoxicillin before that for sinusitis. He presented to ED 12/19 but was subsequently discharged home as CXR was normal and he felt better. Since coming home he just continues to feel week, poor po intake, lethargic. He did not have nausea or vomiting. Diarrhea is non bloody. No associated abdominal pain. No chest pain, shortness of breath or palpitations. He had fevers at home, chills. No lightheadedness or loss of consciousness.  Spoke with pt at bedside. Pt reports poor appetite related to overall feelings of malaise and weakness. Pt was febrile in ED. Now septic, awaiting specimen recognition. Treating underlying cause should improve PO intake.  Pt has no weight loss.   Nutrition-Focused physical exam completed. Findings are no fat depletion, no muscle depletion, and no edema.   Labs: Co2 - 21, Ca 8.0 Medications reviewed.  Diet Order:  Diet regular Room service appropriate?: Yes; Fluid consistency:: Thin  Skin:  Reviewed, no issues  Last BM:  11/21/2015  Height:   Ht Readings from Last 1 Encounters:  11/21/15 5\' 7"  (1.702 m)    Weight:   Wt Readings from Last 1 Encounters:  11/23/15 166 lb 7.2 oz (75.5 kg)    Ideal Body Weight:  67.72 kg  BMI:   Body mass index is 26.06 kg/(m^2).  Estimated Nutritional Needs:   Kcal:  1800-2200 calories  Protein:  70-95 grams  Fluid:  >/= 1.8L  EDUCATION NEEDS:   No education needs identified at this time  Satira Anis. Yazleemar Strassner, MS, RD LDN After Hours/Weekend Pager [phone]

## 2015-11-24 LAB — GLUCOSE, CAPILLARY: GLUCOSE-CAPILLARY: 90 mg/dL (ref 65–99)

## 2015-11-24 NOTE — Evaluation (Signed)
Occupational Therapy Evaluation Patient Details Name: Wayne [name] MRN: [id number] DOB: 1926-06-13 Today's Date: 11/24/2015    History of Present Illness 81 year old male with past medical history of CKD stage 3, CAD, dyslipidemia, hypertension who presented to [facility] ED with reports of diarrhea started prior to the admission. Pt was recently on amoxicillin for sinusitis. He presented to ED 12/19 for weakness, headache but was subsequently discharged home as CXR was normal and he felt better. He was discharged with cefzil.    Clinical Impression   Pt admitted with weakness. Pt currently with functional limitations due to the deficits listed below (see OT Problem List). Pt will benefit from skilled OT to increase their safety and independence with ADL and functional mobility for ADL to facilitate discharge to venue listed below.      Follow Up Recommendations  Home health OT;Supervision/Assistance - 24 hour;SNF (if daughter not able to A - would need SNF)    Equipment Recommendations  None recommended by OT       Precautions / Restrictions Precautions Precautions: Fall Restrictions Weight Bearing Restrictions: No      Mobility Bed Mobility Overal bed mobility: Needs Assistance Bed Mobility: Supine to Sit     Supine to sit: Supervision        Transfers Overall transfer level: Needs assistance Equipment used: 1 person hand held assist Transfers: Sit to/from Stand;Stand Pivot Transfers Sit to Stand: Min guard Stand pivot transfers: Min guard                 ADL Overall ADL's : Needs assistance/impaired     Grooming: Min guard;Standing;Wash/dry hands   Upper Body Bathing: Set up;Sitting       Upper Body Dressing : Sitting;Supervision/safety   Lower Body Dressing: Cueing for safety;Sit to/from stand;Minimal assistance       Toileting- Water quality scientist and Hygiene: Min guard;Sit to/from stand;Cueing for sequencing         General ADL Comments:  pt states daugther from  [place] will be there for a week so he will be able to A him as needed.               Pertinent Vitals/Pain Pain Assessment: No/denies pain        Extremity/Trunk Assessment Upper Extremity Assessment Upper Extremity Assessment: Generalized weakness           Communication Communication Communication: No difficulties   Cognition Arousal/Alertness: Awake/alert Behavior During Therapy: WFL for tasks assessed/performed Overall Cognitive Status: Within Functional Limits for tasks assessed                                Home Living Family/patient expects to be discharged to:: Private residence Living Arrangements: Alone Available Help at Discharge: Available PRN/intermittently Type of Home: House Home Access: Level entry     Home Layout: One level     Bathroom Shower/Tub: Teacher, early years/pre: Handicapped height     Home Equipment: Cane - single point;Shower seat;Walker - 2 wheels          Prior Functioning/Environment Level of Independence: Independent with assistive device(s)             OT Diagnosis: Generalized weakness   OT Problem List: Decreased strength   OT Treatment/Interventions: Self-care/ADL training;DME and/or AE instruction;Patient/family education    OT Goals(Current goals can be found in the care plan section) Acute Rehab OT Goals Patient Stated Goal:  home with daughter to A- and get back to the [facility] OT Goal Formulation: With patient Time For Goal Achievement: 12/01/15 Potential to Achieve Goals: Good  OT Frequency: Min 2X/week   Barriers to D/C: Decreased caregiver support (daugther here for a week)             End of Session Nurse Communication: Mobility status  Activity Tolerance: Patient tolerated treatment well Patient left: in chair;with call bell/phone within reach;with chair alarm set   Time: [id number] OT Time Calculation (min): 18 min Charges:  OT General  Charges $OT Visit: 1 Procedure OT Evaluation $Initial OT Evaluation Tier I: 1 Procedure G-Codes:    Betsy Pries 17-Dec-2015, 10:50 AM

## 2015-11-24 NOTE — Evaluation (Signed)
Physical Therapy Evaluation Patient Details Name: Wayne [name] MRN: [id number] DOB: Feb 10, 1926 Today's Date: 11/24/2015   History of Present Illness  81 year old male with past medical history of CKD stage 3, CAD, dyslipidemia, hypertension who presented to [facility] ED with reports of diarrhea started prior to the admission. Pt was recently on amoxicillin for sinusitis. He presented to ED 12/19 for weakness, headache but was subsequently discharged homE.  Clinical Impression  Pt admitted with above diagnosis. Pt currently with functional limitations due to the deficits listed below (see PT Problem List).  Pt will benefit from skilled PT to increase their independence and safety with mobility to allow discharge to home. Patient's gait is slow but steady with RW.      Follow Up Recommendations Home health PT;Supervision/Assistance - 24 hour    Equipment Recommendations  None recommended by PT    Recommendations for Other Services       Precautions / Restrictions Precautions Precautions: Fall Restrictions Weight Bearing Restrictions: No      Mobility  Bed Mobility Overal bed mobility: Needs Assistance Bed Mobility: Supine to Sit     Supine to sit: Supervision     General bed mobility comments: up with OT  Transfers Overall transfer level: Needs assistance Equipment used: Rolling walker (2 wheeled) Transfers: Sit to/from Stand Sit to Stand: Supervision Stand pivot transfers: Min guard          Ambulation/Gait Ambulation/Gait assistance: Supervision Ambulation Distance (Feet): 300 Feet Assistive device: Rolling walker (2 wheeled) Gait Pattern/deviations: Antalgic Gait velocity: slow   General Gait Details: Patient noted to shuffle feet, gait is slow and steady with RW. Did ambulate in room without  AD and supervision.  Stairs            Wheelchair Mobility    Modified Rankin (Stroke Patients Only)       Balance Overall balance assessment: Needs  assistance         Standing balance support: During functional activity;No upper extremity supported Standing balance-Leahy Scale: Fair                               Pertinent Vitals/Pain Pain Assessment: No/denies pain    Home Living Family/patient expects to be discharged to:: Private residence Living Arrangements: Alone Available Help at Discharge: Available PRN/intermittently Type of Home: House Home Access: Stairs to enter Entrance Stairs-Rails: Psychiatric nurse of Steps: 3 Home Layout: One level Home Equipment: Cane - single point;Shower seat;Walker - 2 wheels      Prior Function Level of Independence: Independent with assistive device(s)         Comments: states  that he goes to [employer] 3 days a week     Hand Dominance        Extremity/Trunk Assessment   Upper Extremity Assessment: Defer to OT evaluation           Lower Extremity Assessment: LLE deficits/detail   LLE Deficits / Details: L knee flexion is limited to about 50 degrees from complications after TKA     Communication   Communication: No difficulties  Cognition Arousal/Alertness: Awake/alert Behavior During Therapy: WFL for tasks assessed/performed Overall Cognitive Status: Within Functional Limits for tasks assessed                      General Comments      Exercises        Assessment/Plan  PT Assessment Patient needs continued PT services  PT Diagnosis Difficulty walking;Generalized weakness   PT Problem List Decreased strength;Decreased activity tolerance;Decreased balance;Decreased mobility;Decreased safety awareness  PT Treatment Interventions DME instruction;Gait training;Functional mobility training;Therapeutic activities;Therapeutic exercise;Stair training   PT Goals (Current goals can be found in the Care Plan section) Acute Rehab PT Goals Patient Stated Goal: home  and get back to the [facility] PT Goal Formulation: With patient Time  For Goal Achievement: 12/08/15 Potential to Achieve Goals: Good    Frequency Min 3X/week   Barriers to discharge   PATIENT STATES THAT HIS DAUGHTER IS IN TOWN X 1 WEEK.    Co-evaluation               End of Session   Activity Tolerance: Patient tolerated treatment well Patient left: with call bell/phone within reach;with chair alarm set Nurse Communication: Mobility status         Time: [id number] PT Time Calculation (min) (ACUTE ONLY): 11 min   Charges:   PT Evaluation $Initial PT Evaluation Tier I: 1 Procedure     PT G CodesClaretha Cooper 11/24/2015, 12:59 PM Tresa Endo PT [phone]

## 2015-11-24 NOTE — Care Management Note (Signed)
Case Management Note  Patient Details  Name: Wayne [name] MRN: [id number] Date of Birth: 11-21-1926  Subjective/Objective:  PT/OT recc HHC. Patient has used [facility] in the past, will use them again. Referral to [facility] rep Santiago Glad aware & following. Await HHPT/HHOT, f81f orders.                  Action/Plan:d/c plan home w/HHC.   Expected Discharge Date:   (unknown)               Expected Discharge Plan:  [place]  In-House Referral:  Clinical Social Work  Discharge planning Services  CM Consult  Post Acute Care Choice:    Choice offered to:  Patient  DME Arranged:    DME Agency:     HH Arranged:    [place] Agency:  [place]  Status of Service:  In process, will continue to follow  Medicare Important Message Given:    Date Medicare IM Given:    Medicare IM give by:    Date Additional Medicare IM Given:    Additional Medicare Important Message give by:     If discussed at [place] of Stay Meetings, dates discussed:    Additional Comments:  Dessa Phi, RN 11/24/2015, 3:04 PM

## 2015-11-24 NOTE — Progress Notes (Signed)
Patient ID: VEER ELAMIN, male   DOB: 06-07-1926, 81 y.o.   MRN: [id number] [facility] PROGRESS NOTE  VERNELL TOWNLEY [id number]:[id number] DOB: [birth year] DOA: 11/22/2015 PCP: [name], MD  Brief narrative:    81 year old male with past medical history of CKD stage 3, CAD, dyslipidemia, hypertension who presented to [facility] ED with reports of diarrhea started prior to the admission. Pt was recently on amoxicillin for sinusitis. He presented to ED 12/19 for weakness, headache but was subsequently discharged home as CXR was normal and he felt better. He was discharged with cefzil.   In ED, BP was 120/72, HR 84, RR 20, T max 101.2 F, oxygen saturation 97% on room air. Blood work showed WBC count 3.7, Creatinine 1.4 (baseline 2.4). CT abdomen showed no acute intra-abdominal findings. He was admitted for evaluation of diarrhea and for dehydration.   Assessment/Plan:    Principal Problem:  Sepsis ([place]) / Diarrhea presumed infectious etiology / Leukopenia  - Sepsis criteria met on admission with fever, tachypnea, leukopenia. Source of infection thought to be possible C.diff considering recent abx use. Lactic acid was WNL, procalcitonin level was 0.15 - Blood cultures negative so far - Pt on empiric flagyl. Diarrhea stopped since admission so stool never collected for C.diff   Active Problems:  Dehydration - Continue IV fluids for next 24 hours but reduce rate to 50 cc/hr    Coronary artery disease involving native coronary artery without angina pectoris - Continue aspirin   Hyperlipidemia - Continue Pravachol and omega 3    Protein-calorie malnutrition, severe (HCC) - In the setting of acute illness - Seen by dietician  - Continue nutritional supplementation    CKD (chronic kidney disease) stage 3, GFR 30-59 ml/min - Baseline creatinine in 04/2014 was 2.4 and on this admission 1.41 - Cr has further improved to 1.2 with hydration     Anemia of chronic renal failure stage  3 - Hemoglobin stable at 10.9   Benign essential HTN - Continue Norvasc daily    Sinusitis - Continue Ceftin   DVT prophylaxis:  - SCD's bilaterally in hospital    Code Status: Full.  Family Communication:  plan of care discussed with the patient Disposition Plan: Home likely 11/25/2015.  IV access:  Peripheral IV  Procedures and diagnostic studies:    Dg Chest 2 View 11/21/2015 No active cardiopulmonary disease. Electronically Signed By: Kathreen Devoid On: 11/21/2015 10:44   Ct Abdomen Pelvis W Contrast 11/22/2015 1. No acute inflammatory process within abdomen or pelvis. 2. Small hiatal hernia. 3. Status post appendectomy. No pericecal inflammation. 4. Mild bilateral renal cortical thinning probable due to atrophy. No hydronephrosis or hydroureter. Bilateral renal symmetrical excretion. 5. No small bowel obstruction. 6. Colonic diverticula are noted descending colon and sigmoid colon. No evidence of acute diverticulitis. 7. Mild enlarged prostate gland with indentation of urinary bladder base. A central calcification within prostate gland measures 5 mm. 8. Degenerative changes lumbar spine. 9. Probable left adrenal adenoma measures 1 cm. Electronically Signed By: Lahoma Crocker M.D. On: 11/22/2015 16:12   Medical Consultants:  None  Other Consultants:  PT Nutrition  IAnti-Infectives:   None    [name], MD  [facility] Pager [phone]  Time spent in minutes: 25 minutes  If 7PM-7AM, please contact night-coverage [email or web address] Password [facility] 11/24/2015, 11:31 AM   LOS: 2 days    HPI/Subjective: No acute overnight events. Patient reports he feels better.  Objective: Filed Vitals:   11/23/15 1325 11/23/15 2118 11/23/15  2300 11/24/15 0535  BP: 123/58 133/61  125/60  Pulse: 73 58  57  Temp: 98.8 F (37.1 C) 100.7 F (38.2 C) 99.8 F (37.7 C) 99.2 F (37.3 C)  TempSrc: Oral Oral Oral Oral  Resp: _0 Weight:    76.885 kg (169 lb 8 oz)   SpO2: 97% 96%  95%    Intake/Output Summary (Last 24 hours) at 11/24/15 1131 Last data filed at 11/24/15 5277  Gross per 24 hour  Intake   2000 ml  Output      2 ml  Net   1998 ml    Exam:   General:  Pt is alert, not in acute distress  Cardiovascular: RRR, appreciate S1, S2   Respiratory: No wheezing, no crackles, no rhonchi  Abdomen: (+) BS, non tender   Extremities: No lew swelling, palpable pulses   Neuro: Nonfocal  Data Reviewed: Basic Metabolic Panel:  Recent Labs Lab 11/21/15 1018 11/22/15 1348 11/23/15 0517  NA 138 137 138  K 4.0 4.0 3.9  CL 103 104 109  CO2 24 24 21*  GLUCOSE 128* 99 95  BUN 24* 24* 24*  CREATININE 1.59* 1.41* 1.29*  CALCIUM 8.9 8.7* 8.0*   Liver Function Tests:  Recent Labs Lab 11/22/15 1348  AST 31  ALT 15*  ALKPHOS 70  BILITOT 0.8  PROT 7.1  ALBUMIN 3.7    Recent Labs Lab 11/22/15 1348  LIPASE 29   No results for input(s): AMMONIA in the last 168 hours. CBC:  Recent Labs Lab 11/21/15 1018 11/22/15 1348 11/23/15 0517  WBC 4.2 3.7* 3.4*  NEUTROABS 2.0 2.9  --   HGB 13.2 13.6 10.9*  HCT 39.0 40.3 32.3*  MCV 98.7 99.0 99.1  PLT 179 164 147*   Cardiac Enzymes: No results for input(s): CKTOTAL, CKMB, CKMBINDEX, TROPONINI in the last 168 hours. BNP: Invalid input(s): POCBNP CBG:  Recent Labs Lab 11/23/15 0803 11/24/15 0739  GLUCAP 88 90    Recent Results (from the past 240 hour(s))  Blood culture (routine x 2)     Status: None (Preliminary result)   Collection Time: 11/22/15  1:36 PM  Result Value Ref Range Status   Specimen Description BLOOD RIGHT ANTECUBITAL  Final   Special Requests BOTTLES DRAWN AEROBIC AND ANAEROBIC 5CC EACH  Final   Culture   Final    NO GROWTH < 24 HOURS Performed at [facility]    Report Status PENDING  Incomplete  Blood culture (routine x 2)     Status: None (Preliminary result)   Collection Time: 11/22/15  2:00 PM  Result Value Ref Range Status   Specimen  Description BLOOD LEFT ANTECUBITAL  Final   Special Requests BOTTLES DRAWN AEROBIC AND ANAEROBIC 5CC EACH  Final   Culture   Final    NO GROWTH < 24 HOURS Performed at [facility]    Report Status PENDING  Incomplete  Urine culture     Status: None   Collection Time: 11/22/15  2:32 PM  Result Value Ref Range Status   Specimen Description URINE, CLEAN CATCH  Final   Special Requests NONE  Final   Culture   Final    1,000 COLONIES/mL INSIGNIFICANT GROWTH Performed at [facility]    Report Status 11/23/2015 FINAL  Final  C difficile quick scan w PCR reflex     Status: None   Collection Time: 11/23/15  2:00 PM  Result Value Ref Range Status  C Diff antigen NEGATIVE NEGATIVE Final   C Diff toxin NEGATIVE NEGATIVE Final   C Diff interpretation Negative for toxigenic C. difficile  Final     Scheduled Meds: . amLODipine  5 mg Oral Daily  . aspirin EC  81 mg Oral Daily  . benzonatate  100 mg Oral Q8H  . cefUROXime  250 mg Oral BID  . chlorhexidine  15 mL Mouth/Throat BID  . feeding supplement (ENSURE ENLIVE)  237 mL Oral BID BM  . ketoconazole  1 application Topical BID  . metronidazole  500 mg Intravenous Q8H  . multivitamin with minerals  1 tablet Oral Daily  . omega-3 acid ethyl esters  1 g Oral Daily  . oxybutynin  5 mg Oral TID  . pravastatin  40 mg Oral Daily  . tamsulosin  0.4 mg Oral Daily  . timolol  1 drop Both Eyes Daily   Continuous Infusions: . sodium chloride 75 mL/hr at 11/23/15 2140

## 2015-11-25 LAB — GLUCOSE, CAPILLARY: Glucose-Capillary: 86 mg/dL (ref 65–99)

## 2015-11-25 MED ORDER — ALUM & MAG HYDROXIDE-SIMETH 200-200-20 MG/5ML PO SUSP
15.0000 mL | Freq: Four times a day (QID) | ORAL | Status: DC | PRN
  Administered 2015-11-25: 15 mL via ORAL
  Filled 2015-11-25: qty 30

## 2015-11-25 MED ORDER — ENSURE ENLIVE PO LIQD: 237.0000 mL | Freq: Two times a day (BID) | ORAL | Status: DC

## 2015-11-25 MED ORDER — ONDANSETRON HCL 4 MG PO TABS: 4.0000 mg | ORAL_TABLET | Freq: Four times a day (QID) | ORAL | Status: DC | PRN

## 2015-11-25 NOTE — Progress Notes (Signed)
Occupational Therapy Treatment Patient Details Name: SELAH BALDRIDGE MRN: [id number] DOB: December 06, 1925 Today's Date: 11/25/2015   History of present illness 81 year old male with past medical history of CKD stage 3, CAD, dyslipidemia, hypertension who presented to [facility] ED 12/20 with reports of diarrhea. He also  presented to ED 12/19 but was subsequently discharged home as CXR was normal and he felt better. Since coming home he just continues to feel week, poor po intake, lethargic. He did not have nausea or vomiting. Diarrhea is non bloody. No associated abdominal pain   OT comments  Pt did well this session performing entire bath and dressing  Follow Up Recommendations  Home health OT;Other (comment);Supervision - Intermittent (and AIDE)    Equipment Recommendations  None recommended by OT    Recommendations for Other Services      Precautions / Restrictions Precautions Precautions: Fall Restrictions Weight Bearing Restrictions: No       Mobility Bed Mobility Overal bed mobility: Modified Independent Bed Mobility: Supine to Sit              Transfers Overall transfer level: Needs assistance Equipment used: Rolling walker (2 wheeled) Transfers: Sit to/from Stand Sit to Stand: Supervision Stand pivot transfers: Supervision                ADL Overall ADL's : Needs assistance/impaired     Grooming: Standing;Supervision/safety   Upper Body Bathing: Set up;Sitting;Standing   Lower Body Bathing: Supervison/ safety;Sit to/from stand   Upper Body Dressing : Sitting;Supervision/safety   Lower Body Dressing: Supervision/safety;Sit to/from stand   Toilet Transfer: Supervision/safety;RW   Toileting- Water quality scientist and Hygiene: Supervision/safety       Functional mobility during ADLs: Supervision/safety General ADL Comments: son present for session and observed pts performance.  Pt with good safety awareness.                  Cognition    Behavior During Therapy: WFL for tasks assessed/performed Overall Cognitive Status: Within Functional Limits for tasks assessed                       Extremity/Trunk Assessment                          Pertinent Vitals/ Pain       Pain Assessment: No/denies pain     Prior Functioning/Environment              Frequency       Progress Toward Goals  OT Goals(current goals can now be found in the care plan section)  Progress towards OT goals: Progressing toward goals     Plan Discharge plan needs to be updated       End of Session Equipment Utilized During Treatment: Rolling walker   Activity Tolerance Patient tolerated treatment well   Patient Left in chair;with call bell/phone within reach;with family/visitor present   Nurse Communication Mobility status        Time: [id number] OT Time Calculation (min): 42 min  Charges: OT General Charges $OT Visit: 1 Procedure OT Treatments $Self Care/Home Management : 38-52 mins  Shlomie Romig D 11/25/2015, 10:30 AM

## 2015-11-25 NOTE — Discharge Instructions (Signed)

## 2015-11-25 NOTE — Discharge Summary (Addendum)
Physician Discharge Summary  Wayne [name] JQG:[id number] DOB: [birth year] DOA: 11/22/2015  PCP: [name], MD  Admit date: 11/22/2015 Discharge date: 11/26/2015  Recommendations for Outpatient Follow-up:  1. Benicar on hold until pt visits PCP who can recheck renal function and if Cr remains stable then it can be resumed. May continue norvasc.  Discharge Diagnoses:  Principal Problem:   Sepsis ([name]) Active Problems:   Dehydration   Diarrhea   Coronary artery disease involving native coronary artery without angina pectoris   Hyperlipidemia   Protein-calorie malnutrition, severe (HCC)   CKD (chronic kidney disease) stage 3, GFR 30-59 ml/min   Leukopenia   Benign essential HTN   Anemia of chronic renal failure, stage 3 (moderate)    Discharge Condition: stable   Diet recommendation: as tolerated   History of present illness:  81 year old male with past medical history of CKD stage 3, CAD, dyslipidemia, hypertension who presented to [facility] ED with reports of diarrhea started prior to the admission. Pt was recently on amoxicillin for sinusitis. He presented to ED 12/19 for weakness, headache but was subsequently discharged home as CXR was normal and he felt better. He was discharged with cefzil.   In ED, BP was 120/72, HR 84, RR 20, T max 101.2 F, oxygen saturation 97% on room air. Blood work showed WBC count 3.7, Creatinine 1.4 (baseline 2.4). CT abdomen showed no acute intra-abdominal findings. He was admitted for evaluation of diarrhea and for dehydration.   Hospital Course:   Assessment/Plan:    Principal Problem:  Sepsis ([place]) / Diarrhea presumed infectious etiology / Leukopenia  - Sepsis criteria met on admission with fever, tachypnea, leukopenia. Source of infection thought to be possible C.diff considering recent abx use. Lactic acid was WNL, procalcitonin level was 0.15 - Blood cultures show no growth - No further diarrhea - C.diff negative  - Sepsis  resolved - Stop flagyl today    Active Problems:  Dehydration - Resolved with hydration. Stop IV fluids today    Coronary artery disease involving native coronary artery without angina pectoris - Continue aspirin on discharge    Hyperlipidemia - Continue Pravachol and omega 3    Protein-calorie malnutrition, severe (HCC) - In the setting of acute illness - Continue nutritional supplementation recommended by dietician    CKD (chronic kidney disease) stage 3, GFR 30-59 ml/min - Baseline creatinine in 04/2014 was 2.4 and on this admission 1.41 - Cr has further improved to 1.2 with hydration  - Would continue to hold benicar until Cr further improves    Anemia of chronic renal failure stage 3 - Hemoglobin stable at 10.9   Benign essential HTN - Continue Norvasc daily - Benicar on hold until renal function improves further    Sinusitis - Improved - Stop Ceftin today   DVT prophylaxis:  - SCD's bilaterally  Code Status: Full.  Family Communication: plan of care discussed with the patient  IV access:  Peripheral IV  Procedures and diagnostic studies:   Dg Chest 2 View 11/21/2015 No active cardiopulmonary disease. Electronically Signed By: Kathreen Devoid On: 11/21/2015 10:44   Ct Abdomen Pelvis W Contrast 11/22/2015 1. No acute inflammatory process within abdomen or pelvis. 2. Small hiatal hernia. 3. Status post appendectomy. No pericecal inflammation. 4. Mild bilateral renal cortical thinning probable due to atrophy. No hydronephrosis or hydroureter. Bilateral renal symmetrical excretion. 5. No small bowel obstruction. 6. Colonic diverticula are noted descending colon and sigmoid colon. No evidence of acute diverticulitis. 7. Mild enlarged  prostate gland with indentation of urinary bladder base. A central calcification within prostate gland measures 5 mm. 8. Degenerative changes lumbar spine. 9. Probable left adrenal adenoma measures 1 cm. Electronically  Signed By: Lahoma Crocker M.D. On: 11/22/2015 16:12   Medical Consultants:  None  Other Consultants:  PT Nutrition  IAnti-Infectives:   None    Signed:  [name], MD  [facility] 11/25/2015, 4:11 PM  Pager #: [phone]  Time spent in minutes: more than 30 minutes   Discharge Exam: Filed Vitals:   11/25/15 0538 11/25/15 1522  BP: 139/64 126/66  Pulse: 46 51  Temp: 99 F (37.2 C) 98.1 F (36.7 C)  Resp: 18 18   Filed Vitals:   11/24/15 1707 11/24/15 2132 11/25/15 0538 11/25/15 1522  BP: 141/64 121/62 139/64 126/66  Pulse: 56 47 46 51  Temp: 98.3 F (36.8 C) 98.7 F (37.1 C) 99 F (37.2 C) 98.1 F (36.7 C)  TempSrc: Oral Oral Oral Oral  Resp: _0 Weight:   79.924 kg (176 lb 3.2 oz)   SpO2: 97% 96% 96% 91%    General: Pt is alert, follows commands appropriately, not in acute distress Cardiovascular: Regular rate and rhythm, S1/S2 appreciated  Respiratory: Clear to auscultation bilaterally, no wheezing, no crackles, no rhonchi Abdominal: Soft, non tender, non distended, bowel sounds +, no guarding Extremities: no edema, no cyanosis, pulses palpable bilaterally DP and PT Neuro: Grossly nonfocal  Discharge Instructions  Discharge Instructions    Call MD for:  difficulty breathing, headache or visual disturbances    Complete by:  As directed      Call MD for:  persistant dizziness or light-headedness    Complete by:  As directed      Call MD for:  persistant nausea and vomiting    Complete by:  As directed      Call MD for:  severe uncontrolled pain    Complete by:  As directed      Diet - low sodium heart healthy    Complete by:  As directed      Increase activity slowly    Complete by:  As directed             Medication List    STOP taking these medications        amoxicillin 500 MG capsule  Commonly known as:  AMOXIL     cefPROZIL 500 MG tablet  Commonly known as:  CEFZIL     Naftifine HCl 2 % Crea      olmesartan-hydrochlorothiazide 40-12.5 MG tablet  Commonly known as:  BENICAR HCT     omeprazole 40 MG capsule  Commonly known as:  PRILOSEC      TAKE these medications        amLODipine 5 MG tablet  Commonly known as:  NORVASC  Take 5 mg by mouth daily.     aspirin EC 81 MG tablet  Take 81 mg by mouth daily.     benzonatate 100 MG capsule  Commonly known as:  TESSALON  Take 1 capsule (100 mg total) by mouth every 8 (eight) hours.     feeding supplement (ENSURE ENLIVE) Liqd  Take 237 mLs by mouth 2 (two) times daily between meals.     Fish Oil 1200 MG Caps  Take 1 capsule by mouth 2 (two) times daily.     ketoconazole 2 % cream  Commonly known as:  NIZORAL  Apply 1 application topically 2 (two)  times daily. Applies to face.     multivitamin with minerals Tabs tablet  Take 1 tablet by mouth daily.     ondansetron 4 MG tablet  Commonly known as:  ZOFRAN  Take 1 tablet (4 mg total) by mouth every 6 (six) hours as needed for nausea.     oxybutynin 5 MG tablet  Commonly known as:  DITROPAN  Take 5 mg by mouth 3 (three) times daily.     pravastatin 40 MG tablet  Commonly known as:  PRAVACHOL  Take 40 mg by mouth daily.     tamsulosin 0.4 MG Caps capsule  Commonly known as:  FLOMAX  Take 0.4 mg by mouth daily.     timolol 0.5 % ophthalmic solution  Commonly known as:  BETIMOL  Place 1 drop into both eyes daily.     traMADol 50 MG tablet  Commonly known as:  ULTRAM  TAKE 1 TABLET BY MOUTH EVERY 6 HOURS AS NEEDED FOR PAIN. MAX 4 TABS PER DAY     Vitamin D (Ergocalciferol) 50000 UNITS Caps capsule  Commonly known as:  DRISDOL  Take 50,000 Units by mouth every 7 (seven) days. Takes on Monday           Follow-up Information    Follow up with [name], MD. Schedule an appointment as soon as possible for a visit in 1 week.   Specialty:  Internal Medicine   Why:  Follow up appt after recent hospitalization   Contact information:   [address] [place] [address] [phone]        The results of significant diagnostics from this hospitalization (including imaging, microbiology, ancillary and laboratory) are listed below for reference.    Significant Diagnostic Studies: Dg Chest 2 View  11/21/2015  CLINICAL DATA:  Worsening productive cough. EXAM: CHEST  2 VIEW COMPARISON:  08/12/2014 FINDINGS: There is calcified left pleural plaque. There is no focal parenchymal opacity. There is no pleural effusion or pneumothorax. The heart and mediastinal contours are unremarkable. The osseous structures are unremarkable. IMPRESSION: No active cardiopulmonary disease. Electronically Signed   By: Kathreen Devoid   On: 11/21/2015 10:44   Ct Abdomen Pelvis W Contrast  11/22/2015  CLINICAL DATA:  Diarrhea during the night, feeling weak, upper abdominal pain EXAM: CT ABDOMEN AND PELVIS WITH CONTRAST TECHNIQUE: Multidetector CT imaging of the abdomen and pelvis was performed using the standard protocol following bolus administration of intravenous contrast. CONTRAST:  28m OMNIPAQUE IOHEXOL 300 MG/ML SOLN, 552mOMNIPAQUE IOHEXOL 300 MG/ML SOLN COMPARISON:  None. FINDINGS: Calcified pleural plaques are noted in left lower lobe anterolaterally. Small hiatal hernia. Sagittal images of the spine shows extensive degenerative changes lumbar spine. Enhanced liver is unremarkable. No calcified gallstones are noted within gallbladder. A right renal artery stent is noted. No aortic aneurysm. Small umbilical hernia containing fat without evidence of acute complication. The pancreas, spleen and right adrenal is unremarkable. Probable left adrenal adenoma measures 1 cm. Kidneys are symmetrical in size and enhancement. Mild renal cortical thinning probable due to atrophy. No hydronephrosis or hydroureter. Delayed renal images shows bilateral renal symmetrical excretion. Bilateral visualized ureter is unremarkable. There is no small bowel obstruction. No ascites or free  air. No adenopathy. No pericecal inflammation. The terminal ileum is unremarkable. The patient is status post appendectomy. Scattered diverticula are noted descending colon. Multiple sigmoid colon diverticula. There is no evidence of acute diverticulitis. The sigmoid colon is empty the rectum is empty. Mild enlarged prostate  gland with indentation of urinary bladder base. Prostate gland measures 6.4 by 4.5 cm. A central calcification within prostate gland measures 5 mm. No calcified calculi are noted within urinary bladder. There is no inguinal adenopathy. No destructive bony lesions are noted within pelvis. IMPRESSION: 1. No acute inflammatory process within abdomen or pelvis. 2. Small hiatal hernia. 3. Status post appendectomy.  No pericecal inflammation. 4. Mild bilateral renal cortical thinning probable due to atrophy. No hydronephrosis or hydroureter. Bilateral renal symmetrical excretion. 5. No small bowel obstruction. 6. Colonic diverticula are noted descending colon and sigmoid colon. No evidence of acute diverticulitis. 7. Mild enlarged prostate gland with indentation of urinary bladder base. A central calcification within prostate gland measures 5 mm. 8. Degenerative changes lumbar spine. 9. Probable left adrenal  adenoma measures 1 cm. Electronically Signed   By: Lahoma Crocker M.D.   On: 11/22/2015 16:12    Microbiology: Recent Results (from the past 240 hour(s))  Blood culture (routine x 2)     Status: None (Preliminary result)   Collection Time: 11/22/15  1:36 PM  Result Value Ref Range Status   Specimen Description BLOOD RIGHT ANTECUBITAL  Final   Special Requests BOTTLES DRAWN AEROBIC AND ANAEROBIC 5CC   Final   Culture   Final    NO GROWTH 3 DAYS Performed at [facility]    Report Status PENDING  Incomplete  Blood culture (routine x 2)     Status: None (Preliminary result)   Collection Time: 11/22/15  2:00 PM  Result Value Ref Range Status   Specimen Description BLOOD LEFT  ANTECUBITAL  Final   Special Requests BOTTLES DRAWN AEROBIC AND ANAEROBIC 5CC   Final   Culture   Final    NO GROWTH 3 DAYS Performed at [facility]    Report Status PENDING  Incomplete  Urine culture     Status: None   Collection Time: 11/22/15  2:32 PM  Result Value Ref Range Status   Specimen Description URINE, CLEAN CATCH  Final   Special Requests NONE  Final   Culture   Final    1,000 COLONIES/mL INSIGNIFICANT GROWTH Performed at [facility]    Report Status 11/23/2015 FINAL  Final  C difficile quick scan w PCR reflex     Status: None   Collection Time: 11/23/15  2:00 PM  Result Value Ref Range Status   C Diff antigen NEGATIVE NEGATIVE Final   C Diff toxin NEGATIVE NEGATIVE Final   C Diff interpretation Negative for toxigenic C. difficile  Final     Labs: Basic Metabolic Panel:  Recent Labs Lab 11/21/15 1018 11/22/15 1348 11/23/15 0517  NA 138 137 138  K 4.0 4.0 3.9  CL 103 104 109  CO2 24 24 21*  GLUCOSE 128* 99 95  BUN 24* 24* 24*  CREATININE 1.59* 1.41* 1.29*  CALCIUM 8.9 8.7* 8.0*   Liver Function Tests:  Recent Labs Lab 11/22/15 1348  AST 31  ALT 15*  ALKPHOS 70  BILITOT 0.8  PROT 7.1  ALBUMIN 3.7    Recent Labs Lab 11/22/15 1348  LIPASE 29   No results for input(s): AMMONIA in the last 168 hours. CBC:  Recent Labs Lab 11/21/15 1018 11/22/15 1348 11/23/15 0517  WBC 4.2 3.7* 3.4*  NEUTROABS 2.0 2.9  --   HGB 13.2 13.6 10.9*  HCT 39.0 40.3 32.3*  MCV 98.7 99.0 99.1  PLT 179 164 147*   Cardiac Enzymes: No results for input(s): CKTOTAL,  CKMB, CKMBINDEX, TROPONINI in the last 168 hours. BNP: BNP (last 3 results) No results for input(s): BNP in the last 8760 hours.  ProBNP (last 3 results) No results for input(s): PROBNP in the last 8760 hours.  CBG:  Recent Labs Lab 11/23/15 0803 11/24/15 0739 11/25/15 0802  GLUCAP 88 90 86

## 2015-11-25 NOTE — Progress Notes (Addendum)
[id number] Davis,RN,BSN,CCM: Spoke with the son and daughter at length concerning what level of care patient will need upon discharge.  Wants to look at snf level as well. Consult to CSW done.  Did explain to the family that hhc is a limited service and not daily. But if someone is willing to be with him once home when the hhc services are not there then coming home is an options/ patient is at a high risk of falls even with rolling walker.

## 2015-11-26 LAB — GLUCOSE, CAPILLARY: GLUCOSE-CAPILLARY: 104 mg/dL — AB (ref 65–99)

## 2015-11-26 NOTE — Care Management Note (Signed)
Case Management Note  Patient Details  Name: Wayne [name] MRN: [id number] Date of Birth: Jan 02, 1926  Subjective/Objective:      Sepsis               Action/Plan: HH arranged with [facility]. Contacted [facility] for scheduled dc home today. Pt has RW at home. Family has decided to assist pt at home.   Expected Discharge Date:  11/26/2015               Expected Discharge Plan:  [place]  In-House Referral:  Clinical Social Work  Discharge planning Services  CM Consult  Post Acute Care Choice:  Home Health Choice offered to:  Patient  DME Arranged:  N/A DME Agency:  NA  HH Arranged:  PT, OT, RN, Nurse's Aide [place] Agency:  [facility]  Status of Service:  Completed, signed off  Medicare Important Message Given:    Date Medicare IM Given:    Medicare IM give by:    Date Additional Medicare IM Given:    Additional Medicare Important Message give by:     If discussed at [place] of Stay Meetings, dates discussed:    Additional Comments:  Erenest Rasher, RN 11/26/2015, 1:47 PM

## 2015-11-26 NOTE — Progress Notes (Signed)
Occupational Therapy Treatment Patient Details Name: SKYLAND FEIST MRN: [id number] DOB: 10-Mar-1926 Today's Date: 11/26/2015    History of present illness 81 year old male with past medical history of CKD stage 3, CAD, dyslipidemia, hypertension who presented to [facility] ED 12/20 with reports of diarrhea. He also  presented to ED 12/19 but was subsequently discharged home as CXR was normal and he felt better. Since coming home he just continues to feel week, poor po intake, lethargic. He did not have nausea or vomiting. Diarrhea is non bloody. No associated abdominal pain   OT comments  Pt did really well this day!  Follow Up Recommendations  Home health OT;Other (comment);Supervision - Intermittent (and AIDE)    Equipment Recommendations  None recommended by OT    Recommendations for Other Services      Precautions / Restrictions Precautions Precautions: None Restrictions Weight Bearing Restrictions: No       Mobility Bed Mobility Overal bed mobility: Modified Independent (extra time ) Bed Mobility: Supine to Sit     Supine to sit: Modified independent (Device/Increase time)     General bed mobility comments: extra time, unable to bend left leg fully to bridge, but has good compensations   Transfers Overall transfer level: Modified independent (extra time ) Equipment used: Rolling walker (2 wheeled) Transfers: Sit to/from Stand Sit to Stand: Modified independent (Device/Increase time) Stand pivot transfers: Modified independent (Device/Increase time)       General transfer comment: pt needs extra time     Balance Overall balance assessment: Modified Independent                                 ADL Overall ADL's : Needs assistance/impaired     Grooming: Wash/dry hands;Wash/dry face;Standing;Modified independent   Upper Body Bathing: Modified independent;Sitting   Lower Body Bathing: Supervison/ safety;Sit to/from stand   Upper Body Dressing :  Independent;Sitting   Lower Body Dressing: Sit to/from stand;Supervision/safety       Toileting- Clothing Manipulation and Hygiene: Modified independent;Sit to/from stand Toileting - Clothing Manipulation Details (indicate cue type and reason): with walker       General ADL Comments: pt feels he is close to baseline and is agreeable to using walker at home      Vision                            Cognition   Behavior During Therapy: [facility] for tasks assessed/performed Overall Cognitive Status: Within Functional Limits for tasks assessed                         Exercises General Exercises - Upper Extremity Shoulder Flexion: AROM;Both;10 reps General Exercises - Lower Extremity Ankle Circles/Pumps: AROM;Both;10 reps Heel Slides:  (limited knee flexion on the left ) Straight Leg Raises: AROM;10 reps;Both Hip Flexion/Marching: AROM;Both;5 reps           Pertinent Vitals/ Pain       Pain Assessment: No/denies pain     Prior Functioning/Environment              Frequency Min 2X/week     Progress Toward Goals  OT Goals(current goals can now be found in the care plan section)  Progress towards OT goals: Progressing toward goals  Acute Rehab OT Goals Patient Stated Goal: home    Plan Discharge plan needs to  be updated    Co-evaluation                 End of Session Equipment Utilized During Treatment: Rolling walker   Activity Tolerance Patient tolerated treatment well   Patient Left in chair;with call bell/phone within reach;with family/visitor present   Nurse Communication Mobility status        Time: 1140-1207 OT Time Calculation (min): 27 min  Charges: OT Treatments $Self Care/Home Management : 23-37 mins  Othel Dicostanzo, Thereasa Parkin 11/26/2015, 12:20 PM

## 2015-11-26 NOTE — Progress Notes (Signed)
Pt seen and examined at the bedside Stable for discharge Please refer to D/C summary done  11/25/2015 No changes in medical management since 11/25/2015  [name] [facility] [id number]

## 2015-11-26 NOTE — Progress Notes (Signed)
Physical Therapy Treatment Patient Details Name: Wayne [name] MRN: [id number] DOB: October 30, 1926 Today's Date: 11/26/2015    History of Present Illness  Pt states he is feeling better     PT Comments    Pt was able to participate well with exercise and gait and incuded a trip to the bathroom with supervision only.  Feel he is ready to return home with daughter assist for the next few days.   With home health PT and increase in activity feel he will be able to return to prior level of independence.   Follow Up Recommendations  Home health PT;Supervision/Assistance - 24 hour     Equipment Recommendations  None recommended by PT    Recommendations for Other Services  Home health PT      Precautions / Restrictions Precautions Precautions: Fall Restrictions Weight Bearing Restrictions: No    Mobility  Bed Mobility Overal bed mobility: Modified Independent (extra time ) Bed Mobility: Supine to Sit     Supine to sit: Modified independent (Device/Increase time)     General bed mobility comments: extra time, unable to bend left leg fully to bridge, but has good compensations   Transfers Overall transfer level: Modified independent (extra time )   Transfers: Sit to/from Stand Sit to Stand: Modified independent (Device/Increase time) Stand pivot transfers: Modified independent (Device/Increase time)       General transfer comment: pt needs extra time   Ambulation/Gait Ambulation/Gait assistance: Supervision Ambulation Distance (Feet): 150 Feet Assistive device: Rolling walker (2 wheeled) Gait Pattern/deviations: Decreased step length - right;Decreased step length - left;Shuffle Gait velocity: slow   General Gait Details: pt with evidence of fatigue especially at end of walk and tended to speed up to get to destination, shuffle, He has diffuclty stepping with left leg due to decreased knee flexion. after walk, O2 sat 96%, HR 66    Stairs Stairs: Yes        General stair comments: Pt able to verbally report correct  sequence for step negotiation. He states he does not need to practice.   Wheelchair Mobility    Modified Rankin (Stroke Patients Only)       Balance Overall balance assessment: Modified Independent (uses occasonal finger touch walker in standing )                                  Cognition Arousal/Alertness: Awake/alert Behavior During Therapy: WFL for tasks assessed/performed Overall Cognitive Status: Within Functional Limits for tasks assessed                      Exercises General Exercises - Upper Extremity Shoulder Flexion: AROM;Both;10 reps General Exercises - Lower Extremity Ankle Circles/Pumps: AROM;Both;10 reps Heel Slides:  (limited knee flexion on the left ) Straight Leg Raises: AROM;10 reps;Both Hip Flexion/Marching: AROM;Both;5 reps    General Comments        Pertinent Vitals/Pain Pain Assessment: No/denies pain    Home Living                      Prior Function            PT Goals (current goals can now be found in the care plan section) Acute Rehab PT Goals Patient Stated Goal: home   PT Goal Formulation: With patient Potential to Achieve Goals: Good    Frequency  Min 3X/week    PT Plan  Co-evaluation             End of Session   Activity Tolerance: Patient tolerated treatment well Patient left: with call bell/phone within reach;in bed     Time: [id number] PT Time Calculation (min) (ACUTE ONLY): 42 min  Charges:  $Gait Training: 8-22 mins $Therapeutic Exercise: 8-22 mins                    G Codes:     Teresa K. Owens Shark, PT  11/26/2015, 9:37 AM

## 2015-11-26 NOTE — Clinical Social Work Note (Signed)
Clinical Social Work Assessment  Patient Details  Name: LANGFORD CARIAS MRN: [id number] Date of Birth: 11-16-1926  Date of referral:  11/26/15               Reason for consult:  Facility Placement                Permission sought to share information with:  Family Supports Permission granted to share information::  Yes, Verbal Permission Granted  Name::     Son Legrand Como and daughter  Agency::     Relationship::  [facility]  Contact Information:  [phone]  Housing/Transportation Living arrangements for the past 2 months:  [place] of Information:  Patient, Adult Children Patient Interpreter Needed:  None Criminal Activity/Legal Involvement Pertinent to Current Situation/Hospitalization:  No - Comment as needed Significant Relationships:  Adult Children Lives with:   alone Do you feel safe going back to the place where you live?  Yes Need for family participation in patient care:  Yes (Comment)  Care giving concerns: Pt son and dtr concerned as patient lives alone   Facilities manager / plan:  CSW met with pt at bedside to complete assessment. Pt shared that he lives alone, however his son checks on him daily. Pt shared his daughter is also in town from [place] for the next few days for Christmas. Per PT notes, pt is doing well with only supervision for ADLS. Pt states he feels safe returning home when medically stable. Pt gave verbal permission for csw to speak with his son and daughter. CSW spoke with son, who also included pt duaghter via speaker phone. Pt children to speak with RN CM to ensure home health services with [facility]. Pt family also able to pursue private duty nursing for supervision if needed.    Employment status:  Retired Forensic scientist:  [employer] PT Recommendations:  [place] / Referral to community resources:   (home health, private duty- to be provided by rn cm. )  Patient/Family's Response  to care:  Patient family and patient interested in patient returning home with home health.   Patient/Family's Understanding of and Emotional Response to Diagnosis, Current Treatment, and Prognosis:  Patient and patient family understanding of pt current diagnosis, treatment, and prognosis. Pt family concerned about pt living at home alone however understanding of pt evaluation.  Emotional Assessment Appearance:  Appears stated age Attitude/Demeanor/Rapport:   (calm and coopeartive, happy ) Affect (typically observed):  Accepting, Calm Orientation:  Oriented to Self, Oriented to Place, Oriented to  Time, Oriented to Situation Alcohol / Substance use:  Not Applicable Psych involvement (Current and /or in the community):  No (Comment)  Discharge Needs  Concerns to be addressed:  No discharge needs identified Readmission within the last 30 days:  No Current discharge risk:  None Barriers to Discharge:  Barriers Resolved   Rece Zechman, [place], LCSW 11/26/2015, 11:51 AM

## 2015-11-27 LAB — CULTURE, BLOOD (ROUTINE X 2)
Culture: NO GROWTH
Culture: NO GROWTH

## 2015-11-30 DIAGNOSIS — I129 Hypertensive chronic kidney disease with stage 1 through stage 4 chronic kidney disease, or unspecified chronic kidney disease: Secondary | ICD-10-CM | POA: Diagnosis not present

## 2015-11-30 DIAGNOSIS — E46 Unspecified protein-calorie malnutrition: Secondary | ICD-10-CM | POA: Diagnosis not present

## 2015-11-30 DIAGNOSIS — A419 Sepsis, unspecified organism: Secondary | ICD-10-CM | POA: Diagnosis not present

## 2015-11-30 DIAGNOSIS — D631 Anemia in chronic kidney disease: Secondary | ICD-10-CM | POA: Diagnosis not present

## 2015-11-30 DIAGNOSIS — A09 Infectious gastroenteritis and colitis, unspecified: Secondary | ICD-10-CM | POA: Diagnosis not present

## 2015-11-30 DIAGNOSIS — N183 Chronic kidney disease, stage 3 (moderate): Secondary | ICD-10-CM | POA: Diagnosis not present

## 2015-11-30 DIAGNOSIS — E785 Hyperlipidemia, unspecified: Secondary | ICD-10-CM | POA: Diagnosis not present

## 2015-12-01 DIAGNOSIS — D631 Anemia in chronic kidney disease: Secondary | ICD-10-CM | POA: Diagnosis not present

## 2015-12-01 DIAGNOSIS — E46 Unspecified protein-calorie malnutrition: Secondary | ICD-10-CM | POA: Diagnosis not present

## 2015-12-01 DIAGNOSIS — A09 Infectious gastroenteritis and colitis, unspecified: Secondary | ICD-10-CM | POA: Diagnosis not present

## 2015-12-01 DIAGNOSIS — A419 Sepsis, unspecified organism: Secondary | ICD-10-CM | POA: Diagnosis not present

## 2015-12-01 DIAGNOSIS — N183 Chronic kidney disease, stage 3 (moderate): Secondary | ICD-10-CM | POA: Diagnosis not present

## 2015-12-01 DIAGNOSIS — I129 Hypertensive chronic kidney disease with stage 1 through stage 4 chronic kidney disease, or unspecified chronic kidney disease: Secondary | ICD-10-CM | POA: Diagnosis not present

## 2015-12-05 DIAGNOSIS — E46 Unspecified protein-calorie malnutrition: Secondary | ICD-10-CM | POA: Diagnosis not present

## 2015-12-05 DIAGNOSIS — A419 Sepsis, unspecified organism: Secondary | ICD-10-CM | POA: Diagnosis not present

## 2015-12-05 DIAGNOSIS — I129 Hypertensive chronic kidney disease with stage 1 through stage 4 chronic kidney disease, or unspecified chronic kidney disease: Secondary | ICD-10-CM | POA: Diagnosis not present

## 2015-12-05 DIAGNOSIS — N183 Chronic kidney disease, stage 3 (moderate): Secondary | ICD-10-CM | POA: Diagnosis not present

## 2015-12-05 DIAGNOSIS — A09 Infectious gastroenteritis and colitis, unspecified: Secondary | ICD-10-CM | POA: Diagnosis not present

## 2015-12-05 DIAGNOSIS — D631 Anemia in chronic kidney disease: Secondary | ICD-10-CM | POA: Diagnosis not present

## 2015-12-06 DIAGNOSIS — I129 Hypertensive chronic kidney disease with stage 1 through stage 4 chronic kidney disease, or unspecified chronic kidney disease: Secondary | ICD-10-CM | POA: Diagnosis not present

## 2015-12-06 DIAGNOSIS — D631 Anemia in chronic kidney disease: Secondary | ICD-10-CM | POA: Diagnosis not present

## 2015-12-06 DIAGNOSIS — E46 Unspecified protein-calorie malnutrition: Secondary | ICD-10-CM | POA: Diagnosis not present

## 2015-12-06 DIAGNOSIS — A09 Infectious gastroenteritis and colitis, unspecified: Secondary | ICD-10-CM | POA: Diagnosis not present

## 2015-12-06 DIAGNOSIS — N183 Chronic kidney disease, stage 3 (moderate): Secondary | ICD-10-CM | POA: Diagnosis not present

## 2015-12-06 DIAGNOSIS — A419 Sepsis, unspecified organism: Secondary | ICD-10-CM | POA: Diagnosis not present

## 2015-12-08 DIAGNOSIS — A09 Infectious gastroenteritis and colitis, unspecified: Secondary | ICD-10-CM | POA: Diagnosis not present

## 2015-12-08 DIAGNOSIS — E46 Unspecified protein-calorie malnutrition: Secondary | ICD-10-CM | POA: Diagnosis not present

## 2015-12-08 DIAGNOSIS — D631 Anemia in chronic kidney disease: Secondary | ICD-10-CM | POA: Diagnosis not present

## 2015-12-08 DIAGNOSIS — N183 Chronic kidney disease, stage 3 (moderate): Secondary | ICD-10-CM | POA: Diagnosis not present

## 2015-12-08 DIAGNOSIS — A419 Sepsis, unspecified organism: Secondary | ICD-10-CM | POA: Diagnosis not present

## 2015-12-08 DIAGNOSIS — I129 Hypertensive chronic kidney disease with stage 1 through stage 4 chronic kidney disease, or unspecified chronic kidney disease: Secondary | ICD-10-CM | POA: Diagnosis not present

## 2015-12-12 DIAGNOSIS — E46 Unspecified protein-calorie malnutrition: Secondary | ICD-10-CM | POA: Diagnosis not present

## 2015-12-12 DIAGNOSIS — A09 Infectious gastroenteritis and colitis, unspecified: Secondary | ICD-10-CM | POA: Diagnosis not present

## 2015-12-12 DIAGNOSIS — D631 Anemia in chronic kidney disease: Secondary | ICD-10-CM | POA: Diagnosis not present

## 2015-12-12 DIAGNOSIS — A419 Sepsis, unspecified organism: Secondary | ICD-10-CM | POA: Diagnosis not present

## 2015-12-12 DIAGNOSIS — N183 Chronic kidney disease, stage 3 (moderate): Secondary | ICD-10-CM | POA: Diagnosis not present

## 2015-12-12 DIAGNOSIS — I129 Hypertensive chronic kidney disease with stage 1 through stage 4 chronic kidney disease, or unspecified chronic kidney disease: Secondary | ICD-10-CM | POA: Diagnosis not present

## 2015-12-14 DIAGNOSIS — D631 Anemia in chronic kidney disease: Secondary | ICD-10-CM | POA: Diagnosis not present

## 2015-12-14 DIAGNOSIS — A419 Sepsis, unspecified organism: Secondary | ICD-10-CM | POA: Diagnosis not present

## 2015-12-14 DIAGNOSIS — A09 Infectious gastroenteritis and colitis, unspecified: Secondary | ICD-10-CM | POA: Diagnosis not present

## 2015-12-14 DIAGNOSIS — N183 Chronic kidney disease, stage 3 (moderate): Secondary | ICD-10-CM | POA: Diagnosis not present

## 2015-12-14 DIAGNOSIS — E46 Unspecified protein-calorie malnutrition: Secondary | ICD-10-CM | POA: Diagnosis not present

## 2015-12-14 DIAGNOSIS — I129 Hypertensive chronic kidney disease with stage 1 through stage 4 chronic kidney disease, or unspecified chronic kidney disease: Secondary | ICD-10-CM | POA: Diagnosis not present

## 2015-12-16 DIAGNOSIS — N182 Chronic kidney disease, stage 2 (mild): Secondary | ICD-10-CM | POA: Diagnosis not present

## 2015-12-16 DIAGNOSIS — I1310 Hypertensive heart and chronic kidney disease without heart failure, with stage 1 through stage 4 chronic kidney disease, or unspecified chronic kidney disease: Secondary | ICD-10-CM | POA: Diagnosis not present

## 2015-12-19 DIAGNOSIS — D631 Anemia in chronic kidney disease: Secondary | ICD-10-CM | POA: Diagnosis not present

## 2015-12-19 DIAGNOSIS — A419 Sepsis, unspecified organism: Secondary | ICD-10-CM | POA: Diagnosis not present

## 2015-12-19 DIAGNOSIS — N183 Chronic kidney disease, stage 3 (moderate): Secondary | ICD-10-CM | POA: Diagnosis not present

## 2015-12-19 DIAGNOSIS — I129 Hypertensive chronic kidney disease with stage 1 through stage 4 chronic kidney disease, or unspecified chronic kidney disease: Secondary | ICD-10-CM | POA: Diagnosis not present

## 2015-12-19 DIAGNOSIS — A09 Infectious gastroenteritis and colitis, unspecified: Secondary | ICD-10-CM | POA: Diagnosis not present

## 2015-12-19 DIAGNOSIS — E46 Unspecified protein-calorie malnutrition: Secondary | ICD-10-CM | POA: Diagnosis not present

## 2015-12-21 DIAGNOSIS — E46 Unspecified protein-calorie malnutrition: Secondary | ICD-10-CM | POA: Diagnosis not present

## 2015-12-21 DIAGNOSIS — N183 Chronic kidney disease, stage 3 (moderate): Secondary | ICD-10-CM | POA: Diagnosis not present

## 2015-12-21 DIAGNOSIS — D631 Anemia in chronic kidney disease: Secondary | ICD-10-CM | POA: Diagnosis not present

## 2015-12-21 DIAGNOSIS — A419 Sepsis, unspecified organism: Secondary | ICD-10-CM | POA: Diagnosis not present

## 2015-12-21 DIAGNOSIS — I129 Hypertensive chronic kidney disease with stage 1 through stage 4 chronic kidney disease, or unspecified chronic kidney disease: Secondary | ICD-10-CM | POA: Diagnosis not present

## 2015-12-21 DIAGNOSIS — A09 Infectious gastroenteritis and colitis, unspecified: Secondary | ICD-10-CM | POA: Diagnosis not present

## 2015-12-26 DIAGNOSIS — D631 Anemia in chronic kidney disease: Secondary | ICD-10-CM | POA: Diagnosis not present

## 2015-12-26 DIAGNOSIS — A09 Infectious gastroenteritis and colitis, unspecified: Secondary | ICD-10-CM | POA: Diagnosis not present

## 2015-12-26 DIAGNOSIS — N183 Chronic kidney disease, stage 3 (moderate): Secondary | ICD-10-CM | POA: Diagnosis not present

## 2015-12-26 DIAGNOSIS — E46 Unspecified protein-calorie malnutrition: Secondary | ICD-10-CM | POA: Diagnosis not present

## 2015-12-26 DIAGNOSIS — I129 Hypertensive chronic kidney disease with stage 1 through stage 4 chronic kidney disease, or unspecified chronic kidney disease: Secondary | ICD-10-CM | POA: Diagnosis not present

## 2015-12-26 DIAGNOSIS — A419 Sepsis, unspecified organism: Secondary | ICD-10-CM | POA: Diagnosis not present

## 2015-12-28 DIAGNOSIS — D631 Anemia in chronic kidney disease: Secondary | ICD-10-CM | POA: Diagnosis not present

## 2015-12-28 DIAGNOSIS — N183 Chronic kidney disease, stage 3 (moderate): Secondary | ICD-10-CM | POA: Diagnosis not present

## 2015-12-28 DIAGNOSIS — A09 Infectious gastroenteritis and colitis, unspecified: Secondary | ICD-10-CM | POA: Diagnosis not present

## 2015-12-28 DIAGNOSIS — I129 Hypertensive chronic kidney disease with stage 1 through stage 4 chronic kidney disease, or unspecified chronic kidney disease: Secondary | ICD-10-CM | POA: Diagnosis not present

## 2015-12-28 DIAGNOSIS — A419 Sepsis, unspecified organism: Secondary | ICD-10-CM | POA: Diagnosis not present

## 2015-12-28 DIAGNOSIS — E46 Unspecified protein-calorie malnutrition: Secondary | ICD-10-CM | POA: Diagnosis not present

## 2016-01-02 DIAGNOSIS — A09 Infectious gastroenteritis and colitis, unspecified: Secondary | ICD-10-CM | POA: Diagnosis not present

## 2016-01-02 DIAGNOSIS — D631 Anemia in chronic kidney disease: Secondary | ICD-10-CM | POA: Diagnosis not present

## 2016-01-02 DIAGNOSIS — N183 Chronic kidney disease, stage 3 (moderate): Secondary | ICD-10-CM | POA: Diagnosis not present

## 2016-01-02 DIAGNOSIS — A419 Sepsis, unspecified organism: Secondary | ICD-10-CM | POA: Diagnosis not present

## 2016-01-02 DIAGNOSIS — I129 Hypertensive chronic kidney disease with stage 1 through stage 4 chronic kidney disease, or unspecified chronic kidney disease: Secondary | ICD-10-CM | POA: Diagnosis not present

## 2016-01-02 DIAGNOSIS — E46 Unspecified protein-calorie malnutrition: Secondary | ICD-10-CM | POA: Diagnosis not present

## 2016-01-04 DIAGNOSIS — D631 Anemia in chronic kidney disease: Secondary | ICD-10-CM | POA: Diagnosis not present

## 2016-01-04 DIAGNOSIS — I129 Hypertensive chronic kidney disease with stage 1 through stage 4 chronic kidney disease, or unspecified chronic kidney disease: Secondary | ICD-10-CM | POA: Diagnosis not present

## 2016-01-04 DIAGNOSIS — E46 Unspecified protein-calorie malnutrition: Secondary | ICD-10-CM | POA: Diagnosis not present

## 2016-01-04 DIAGNOSIS — N183 Chronic kidney disease, stage 3 (moderate): Secondary | ICD-10-CM | POA: Diagnosis not present

## 2016-01-04 DIAGNOSIS — A419 Sepsis, unspecified organism: Secondary | ICD-10-CM | POA: Diagnosis not present

## 2016-01-04 DIAGNOSIS — A09 Infectious gastroenteritis and colitis, unspecified: Secondary | ICD-10-CM | POA: Diagnosis not present

## 2016-01-13 DIAGNOSIS — I129 Hypertensive chronic kidney disease with stage 1 through stage 4 chronic kidney disease, or unspecified chronic kidney disease: Secondary | ICD-10-CM | POA: Diagnosis not present

## 2016-01-13 DIAGNOSIS — N183 Chronic kidney disease, stage 3 (moderate): Secondary | ICD-10-CM | POA: Diagnosis not present

## 2016-01-13 DIAGNOSIS — A09 Infectious gastroenteritis and colitis, unspecified: Secondary | ICD-10-CM | POA: Diagnosis not present

## 2016-01-13 DIAGNOSIS — A419 Sepsis, unspecified organism: Secondary | ICD-10-CM | POA: Diagnosis not present

## 2016-01-13 DIAGNOSIS — E46 Unspecified protein-calorie malnutrition: Secondary | ICD-10-CM | POA: Diagnosis not present

## 2016-01-13 DIAGNOSIS — D631 Anemia in chronic kidney disease: Secondary | ICD-10-CM | POA: Diagnosis not present

## 2016-01-17 DIAGNOSIS — I150 Renovascular hypertension: Secondary | ICD-10-CM | POA: Diagnosis not present

## 2016-01-17 DIAGNOSIS — R109 Unspecified abdominal pain: Secondary | ICD-10-CM | POA: Diagnosis not present

## 2016-01-17 DIAGNOSIS — E7800 Pure hypercholesterolemia, unspecified: Secondary | ICD-10-CM | POA: Diagnosis not present

## 2016-02-08 DIAGNOSIS — M1712 Unilateral primary osteoarthritis, left knee: Secondary | ICD-10-CM | POA: Diagnosis not present

## 2016-02-13 ENCOUNTER — Ambulatory Visit (INDEPENDENT_AMBULATORY_CARE_PROVIDER_SITE_OTHER): Payer: Medicare Other | Admitting: Podiatry

## 2016-02-13 ENCOUNTER — Ambulatory Visit (INDEPENDENT_AMBULATORY_CARE_PROVIDER_SITE_OTHER): Payer: Medicare Other

## 2016-02-13 ENCOUNTER — Encounter: Payer: Self-pay | Admitting: Podiatry

## 2016-02-13 VITALS — BP 143/79 | HR 74 | Resp 18

## 2016-02-13 DIAGNOSIS — M79673 Pain in unspecified foot: Secondary | ICD-10-CM | POA: Diagnosis not present

## 2016-02-13 DIAGNOSIS — R52 Pain, unspecified: Secondary | ICD-10-CM

## 2016-02-13 DIAGNOSIS — M19079 Primary osteoarthritis, unspecified ankle and foot: Secondary | ICD-10-CM | POA: Diagnosis not present

## 2016-02-13 DIAGNOSIS — M2042 Other hammer toe(s) (acquired), left foot: Secondary | ICD-10-CM

## 2016-02-15 ENCOUNTER — Encounter: Payer: Self-pay | Admitting: Podiatry

## 2016-02-15 DIAGNOSIS — M1711 Unilateral primary osteoarthritis, right knee: Secondary | ICD-10-CM | POA: Diagnosis not present

## 2016-02-15 NOTE — Progress Notes (Signed)
Patient ID: Wayne [name], male   DOB: August 30, 1926, 82 y.o.   MRN: [id number]  Subjective: Presents the office with concerns of pain on the instep on his right foot. This has been ongoing for the last couple of months. Denies any recent swelling or redness. No injury or trauma. No tingling or numbness. He gets pain when he walks or stands for quite some time. He said no recent treatment. Denies any systemic complaints such as fevers, chills, nausea, vomiting. No acute changes since last appointment, and no other complaints at this time.   Objective: AAO x3, NAD DP/PT pulses palpable bilaterally, CRT less than 3 seconds Protective sensation intact with Simms Weinstein monofilament Mild hammertoe contractures are present lesser digits. There is a significant decrease in medial arch height upon weightbearing and there is a prominence of the plantar medial aspect of the foot on the midfoot plantarly. There is no specific area pinpoint bony tenderness or pain the vibratory sensation. There is no overlying edema, erythema, increased warmth. MMT 5/5. Subtalar joint range of motion is restricted. No open lesions or pre-ulcerative lesions.  No pain with calf compression, swelling, warmth, erythema  Assessment: 82 year old male with symptomatic flat foot deformity with Ostrow threaded changes present.  Plan: -All treatment options discussed with the patient including all alternatives, risks, complications.  -X-rays were obtained and reviewed. There is evidence of flatfoot deformity and midfoot arthritic changes present. -Given his flatfoot and osteoarthritis, he would likely benefit form orthotics. I discussed him custom orthotics. He would like to go to [facility] to have these made. A prescription was provided today. -Follow-up after inserts. -Patient encouraged to call the office with any questions, concerns, change in symptoms.   Celesta Gentile, DPM

## 2016-02-22 DIAGNOSIS — M1711 Unilateral primary osteoarthritis, right knee: Secondary | ICD-10-CM | POA: Diagnosis not present

## 2016-02-22 DIAGNOSIS — Z961 Presence of intraocular lens: Secondary | ICD-10-CM | POA: Diagnosis not present

## 2016-02-22 DIAGNOSIS — H40013 Open angle with borderline findings, low risk, bilateral: Secondary | ICD-10-CM | POA: Diagnosis not present

## 2016-02-22 DIAGNOSIS — H25811 Combined forms of age-related cataract, right eye: Secondary | ICD-10-CM | POA: Diagnosis not present

## 2016-02-29 DIAGNOSIS — M1711 Unilateral primary osteoarthritis, right knee: Secondary | ICD-10-CM | POA: Diagnosis not present

## 2016-03-07 DIAGNOSIS — M1711 Unilateral primary osteoarthritis, right knee: Secondary | ICD-10-CM | POA: Diagnosis not present

## 2016-03-07 DIAGNOSIS — M25571 Pain in right ankle and joints of right foot: Secondary | ICD-10-CM | POA: Diagnosis not present

## 2016-04-04 DIAGNOSIS — Z961 Presence of intraocular lens: Secondary | ICD-10-CM | POA: Diagnosis not present

## 2016-04-04 DIAGNOSIS — H401131 Primary open-angle glaucoma, bilateral, mild stage: Secondary | ICD-10-CM | POA: Diagnosis not present

## 2016-04-04 DIAGNOSIS — H2511 Age-related nuclear cataract, right eye: Secondary | ICD-10-CM | POA: Diagnosis not present

## 2016-04-16 ENCOUNTER — Encounter: Payer: Self-pay | Admitting: Podiatry

## 2016-04-16 ENCOUNTER — Ambulatory Visit (INDEPENDENT_AMBULATORY_CARE_PROVIDER_SITE_OTHER): Payer: Medicare Other | Admitting: Podiatry

## 2016-04-16 VITALS — BP 148/73 | HR 71 | Resp 18

## 2016-04-16 DIAGNOSIS — M2141 Flat foot [pes planus] (acquired), right foot: Secondary | ICD-10-CM

## 2016-04-16 DIAGNOSIS — M19079 Primary osteoarthritis, unspecified ankle and foot: Secondary | ICD-10-CM | POA: Diagnosis not present

## 2016-04-16 DIAGNOSIS — M2142 Flat foot [pes planus] (acquired), left foot: Secondary | ICD-10-CM | POA: Diagnosis not present

## 2016-04-16 DIAGNOSIS — M2040 Other hammer toe(s) (acquired), unspecified foot: Secondary | ICD-10-CM | POA: Diagnosis not present

## 2016-04-16 NOTE — Progress Notes (Signed)
Patient ID: Wayne [name], male   DOB: [birth year], 82 y.o.   MRN: [id number]  Subjective: 82 year old male presents the office today requesting inserts for his shoes. He can skate quite a bit of pain on the instep of his foot for which she points to prominence on the plantar medial aspect of the foot. He has is he gets pain to his hammertoes as a press down his shoes. Denies any open sores or any calluses. Denies any swelling or redness or warmth of the foot. Denies any systemic complaints such as fevers, chills, nausea, vomiting. No acute changes since last appointment, and no other complaints at this time.   Objective: AAO x3, NAD DP/PT pulses palpable bilaterally, CRT less than 3 seconds Protective sensation intact with Derrel Nip monofilament There is a significant decrease in medial arch height upon weightbearing bilaterally the problems that talar head on the plantar medial aspect of the foot. This is where he is the majority of his discomfort upon weightbearing although he is not having discomfort at this time upon palpation. On the mucinous comers with walking and pressure. He also has semirigid hammertoe contractures of the right third and fourth toes and he has some tenderness on the very distal aspect of the toe with walking and pressure. The there is no edema, erythema, pre-ulcerative callus or open wounds. No areas of pinpoint bony tenderness or pain with vibratory sensation. MMT 5/5, ROM WNL. No edema, erythema, increase in warmth to bilateral lower extremities.  No open lesions or pre-ulcerative lesions.  No pain with calf compression, swelling, warmth, erythema  Assessment: Symptomatic flatfoot deformity, hammertoes.  Plan: -All treatment options discussed with the patient including all alternatives, risks, complications.  -I discussed conservative the patient. He is unable to go to [facility] to do them as he states the nail longer do them. He was molded for inserts  today. -Offloading pad to the hammertoes. -Follow-up with inserts arrive or sooner if any issues are to arise. -Patient encouraged to call the office with any questions, concerns, change in symptoms.   Celesta Gentile, DPM

## 2016-04-19 ENCOUNTER — Telehealth: Payer: Self-pay | Admitting: *Deleted

## 2016-04-19 NOTE — Telephone Encounter (Signed)
Spoke with Mr. Lunn in regards to custom molded inserts that Dr. Jacqualyn Posey recommended.  I explained custom molded orthotics are not covered by Medicare or any replacement or supplemental plans.  I the charge for these inserts is $275 and will be due at the time of pick up since Dr. Jacqualyn Posey was having me call him to discuss it.  Also told him that he will need to sign an ABN when he picks them up as it is required by Medicare.  Patient agreed we scheduled him an appointment for 4 weeks to pick them up.

## 2016-04-23 DIAGNOSIS — M1711 Unilateral primary osteoarthritis, right knee: Secondary | ICD-10-CM | POA: Diagnosis not present

## 2016-05-15 ENCOUNTER — Ambulatory Visit: Payer: Medicare Other | Admitting: *Deleted

## 2016-05-15 DIAGNOSIS — M79673 Pain in unspecified foot: Secondary | ICD-10-CM

## 2016-05-15 NOTE — Progress Notes (Signed)
Patient ID: Wayne [name], male   DOB: 1926-06-27, 82 y.o.   MRN: [id number] Patient presents for orthotic pick up.  Verbal and written break in and wear instructions given.  Patient will follow up in 4 weeks if symptoms worsen or fail to improve.

## 2016-05-15 NOTE — Patient Instructions (Signed)

## 2016-06-07 DIAGNOSIS — I1310 Hypertensive heart and chronic kidney disease without heart failure, with stage 1 through stage 4 chronic kidney disease, or unspecified chronic kidney disease: Secondary | ICD-10-CM | POA: Diagnosis not present

## 2016-06-07 DIAGNOSIS — I2510 Atherosclerotic heart disease of native coronary artery without angina pectoris: Secondary | ICD-10-CM | POA: Diagnosis not present

## 2016-06-07 DIAGNOSIS — R7309 Other abnormal glucose: Secondary | ICD-10-CM | POA: Diagnosis not present

## 2016-06-07 DIAGNOSIS — N182 Chronic kidney disease, stage 2 (mild): Secondary | ICD-10-CM | POA: Diagnosis not present

## 2016-06-07 DIAGNOSIS — E559 Vitamin D deficiency, unspecified: Secondary | ICD-10-CM | POA: Diagnosis not present

## 2016-06-22 ENCOUNTER — Encounter: Payer: Self-pay | Admitting: Podiatry

## 2016-06-22 ENCOUNTER — Ambulatory Visit (INDEPENDENT_AMBULATORY_CARE_PROVIDER_SITE_OTHER): Payer: Medicare Other | Admitting: Podiatry

## 2016-06-22 DIAGNOSIS — M2040 Other hammer toe(s) (acquired), unspecified foot: Secondary | ICD-10-CM | POA: Diagnosis not present

## 2016-06-22 NOTE — Patient Instructions (Signed)

## 2016-06-25 ENCOUNTER — Telehealth: Payer: Self-pay | Admitting: *Deleted

## 2016-06-25 DIAGNOSIS — R0989 Other specified symptoms and signs involving the circulatory and respiratory systems: Secondary | ICD-10-CM

## 2016-06-25 NOTE — Telephone Encounter (Signed)
Dr. Jacqualyn Posey ordered arterial dopplers. Faxed to [facility].

## 2016-07-01 DIAGNOSIS — M2040 Other hammer toe(s) (acquired), unspecified foot: Secondary | ICD-10-CM | POA: Insufficient documentation

## 2016-07-01 NOTE — Progress Notes (Signed)
Subjective: 82 year old male presents the office today requesting to discuss surgical intervention his right third and fourth toes. He is tried multiple conservative treatment options including shoe gear changes, offload, padding without any relief symptoms and is tentatively going to proceed with surgical intervention to straighten the third and fourth toes. He has pain on daily basis mostly with shoe gear and pressure. Denies any systemic complaints such as fevers, chills, nausea, vomiting. No acute changes since last appointment, and no other complaints at this time.   Objective: AAO x3, NAD DP/PT pulses palpable bilaterally, CRT less than 3 seconds Protective sensation intact with Simms Weinstein monofilament Hammertoes are present right third and fourth toe. They're semirigid. There is mild erythema to the dorsal aspect of the PIPJ. Shoe gear. There is tenderness upon the distal aspect of the toe is also dorsal PIPJ. No other areas of tenderness bilaterally. No pain to the other toes at this time. No areas of pinpoint bony tenderness or pain with vibratory sensation. MMT 5/5, ROM WNL. No edema, erythema, increase in warmth to bilateral lower extremities.  No open lesions or pre-ulcerative lesions.  No pain with calf compression, swelling, warmth, erythema  Assessment: Right third fourth digit hammertoe contracture  Plan: -All treatment options discussed with the patient including all alternatives, risks, complications.  -At this time he has proceeded with multiple conservative treatment for any relief of symptoms at this time he is requesting surgical intervention. I discussed with him hammertoe repair including arthroplasty the toes. Discussed with him possible K wire fixation. He wishes to proceed with surgery under same risks, complications. -Will obtain arterial studies prior to surgery as well as medical clearance. -The incision placement as well as the postoperative course was  discussed with the patient. I discussed risks of the surgery which include, but not limited to, infection, bleeding, pain, swelling, need for further surgery, delayed or nonhealing, painful or ugly scar, numbness or sensation changes, over/under correction, recurrence, transfer lesions, further deformity, hardware failure, DVT/PE, loss of toe/foot. Patient understands these risks and wishes to proceed with surgery. The surgical consent was reviewed with the patient all 3 pages were signed. No promises or guarantees were given to the outcome of the procedure. All questions were answered to the best of my ability. Before the surgery the patient was encouraged to call the office if there is any further questions. The surgery will be performed at the [facility] [place] on an outpatient basis. -Patient encouraged to call the office with any questions, concerns, change in symptoms.   Celesta Gentile, DPM

## 2016-07-04 ENCOUNTER — Ambulatory Visit (HOSPITAL_COMMUNITY)
Admission: RE | Admit: 2016-07-04 | Discharge: 2016-07-04 | Disposition: A | Discharge status: Home / Self Care | Payer: Medicare Other | Source: Ambulatory Visit | Attending: Podiatry | Admitting: Podiatry

## 2016-07-04 ENCOUNTER — Other Ambulatory Visit: Payer: Self-pay | Admitting: Podiatry

## 2016-07-04 DIAGNOSIS — R0989 Other specified symptoms and signs involving the circulatory and respiratory systems: Secondary | ICD-10-CM

## 2016-07-20 ENCOUNTER — Telehealth: Payer: Self-pay | Admitting: *Deleted

## 2016-07-20 NOTE — Telephone Encounter (Signed)
"  I'm a patient of Dr. Leigh Aurora.  He's supposed to operate on my foot.  I had an ultrasound 3 weeks ago and I haven't heard anything.  I'm trying to find out when I'm having surgery."

## 2016-07-25 ENCOUNTER — Encounter: Payer: Self-pay | Admitting: *Deleted

## 2016-07-25 NOTE — Telephone Encounter (Signed)
I'm returning your call.  We did get your doppler results.  Dr. Jacqualyn Posey wants to get medical clearance from your primary care physician.  Who is your primary care doctor?  "Dr. Baird Cancer is my doctor."  Once we get a response from Dr. Baird Cancer, we'll let you know if we can proceed and get you scheduled for surgery.  Medical clearance letter was faxed to Dr. Glendale Chard to get clearance for surgery.

## 2016-08-03 ENCOUNTER — Telehealth: Payer: Self-pay | Admitting: Podiatry

## 2016-08-03 NOTE — Telephone Encounter (Signed)
Pt called checking on his surgery scheduling status. Told pt  waiting on medical clearance from PCP per Delydia.Told pt when we get the clearance letter we would give him a call to schedule surgery

## 2016-08-15 NOTE — Telephone Encounter (Signed)
"  I am a patient of Dr. Jacqualyn Posey.  It has been 2 months that I have tried to find out when my surgery is going to be.  I have had the ultrasound.  Give me a call.

## 2016-08-21 DIAGNOSIS — Z01818 Encounter for other preprocedural examination: Secondary | ICD-10-CM | POA: Diagnosis not present

## 2016-08-21 DIAGNOSIS — M2041 Other hammer toe(s) (acquired), right foot: Secondary | ICD-10-CM | POA: Diagnosis not present

## 2016-08-21 DIAGNOSIS — Z23 Encounter for immunization: Secondary | ICD-10-CM | POA: Diagnosis not present

## 2016-08-29 ENCOUNTER — Telehealth: Payer: Self-pay | Admitting: *Deleted

## 2016-08-29 NOTE — Telephone Encounter (Signed)
I am returning your call.  We have not received authorization for surgery from Dr. Baird Cancer yet.  They sent a fax but it was what we sent them requesting medical clearance.  I called and left a message for them to resend because it was what we had requested.  "I went over there last week and they did an EKG and everything.  I will go over there at 2pm and get it.  I will personally bring it to you.  They are closed for lunch right now.

## 2016-08-29 NOTE — Telephone Encounter (Signed)
"  I was calling to check on my operation and the clearance from Dr. Baird Cancer."

## 2016-09-11 ENCOUNTER — Telehealth: Payer: Self-pay | Admitting: Podiatry

## 2016-09-11 NOTE — Telephone Encounter (Signed)
Pt called to see if we have gotten a letter for clearance for pt to have surgery from pcp.  Per D Meadows she has not received it. Pt states he will go to his pcp first thing in the am.

## 2016-09-13 ENCOUNTER — Telehealth: Payer: Self-pay | Admitting: *Deleted

## 2016-09-13 NOTE — Telephone Encounter (Signed)
I am calling to let you know we received medical clearance from your doctor.  We can proceed with getting you scheduled for surgery.  When would you like to schedule?  "The sooner the better."  He can do it Wednesday of next week if you like.  "That date will be okay,  Where do I go?"  You will be going to [facility].  They are located at [place]. [address].  You will need to register with the surgical center, they will give you a call.  You should have a brochure in your blue bag.  "I have the bag but I'm not sure if I have a brochure in it.  I know where to go.  Thank you."  Someone from the surgical center will give you a call with the arrival time.  Remember not to eat or drink anything after midnight, the night before surgery date.

## 2016-09-18 DIAGNOSIS — N400 Enlarged prostate without lower urinary tract symptoms: Secondary | ICD-10-CM | POA: Diagnosis not present

## 2016-09-19 ENCOUNTER — Encounter: Payer: Self-pay | Admitting: Podiatry

## 2016-09-19 DIAGNOSIS — K219 Gastro-esophageal reflux disease without esophagitis: Secondary | ICD-10-CM | POA: Diagnosis not present

## 2016-09-19 DIAGNOSIS — M2041 Other hammer toe(s) (acquired), right foot: Secondary | ICD-10-CM | POA: Diagnosis not present

## 2016-09-20 NOTE — Progress Notes (Signed)
DOS 10.18.2017 Right Foot Hammertoe Correction of the Third and Fourth Toes with Use of K-wire Fixation

## 2016-09-26 ENCOUNTER — Telehealth: Payer: Self-pay | Admitting: *Deleted

## 2016-09-26 NOTE — Telephone Encounter (Addendum)
Pt states he stopped the Cephalexin, it was causing his throat and mouth to be so dry he couldn't swallow. 09/27/2016-Informed pt of Dr. Leigh Aurora orders.

## 2016-09-26 NOTE — Telephone Encounter (Signed)
OK to stop.

## 2016-09-28 ENCOUNTER — Ambulatory Visit (INDEPENDENT_AMBULATORY_CARE_PROVIDER_SITE_OTHER): Payer: Medicare Other | Admitting: Podiatry

## 2016-09-28 ENCOUNTER — Ambulatory Visit (INDEPENDENT_AMBULATORY_CARE_PROVIDER_SITE_OTHER): Payer: Medicare Other

## 2016-09-28 VITALS — BP 147/75 | HR 63 | Temp 97.1°F

## 2016-09-28 DIAGNOSIS — M2040 Other hammer toe(s) (acquired), unspecified foot: Secondary | ICD-10-CM

## 2016-09-28 DIAGNOSIS — Z09 Encounter for follow-up examination after completed treatment for conditions other than malignant neoplasm: Secondary | ICD-10-CM

## 2016-09-28 DIAGNOSIS — M2041 Other hammer toe(s) (acquired), right foot: Secondary | ICD-10-CM

## 2016-09-28 MED ORDER — HYDROCODONE-ACETAMINOPHEN 5-325 MG PO TABS: 1.0000 | ORAL_TABLET | ORAL | 0 refills | Status: DC | PRN

## 2016-09-30 NOTE — Progress Notes (Signed)
Subjective: Wayne [name] is a 82 y.o. is seen today in office s/p right 3rd and 4th digit hammertoe repair preformed on 09/19/16. They state their pain is much better controlled. He states he is having quite a bit of pain in the first couple days of the surgery for this as improved. Discontinue the surgical shoe. Denies any systemic complaints such as fevers, chills, nausea, vomiting. No calf pain, chest pain, shortness of breath.   Objective: General: No acute distress, AAOx3  DP/PT pulses palpable 2/4, CRT < 3 sec to all digits.  Protective sensation intact. Motor function intact.  Right foot: Incision is well coapted without any evidence of dehiscence and sutures are intact.K-wires intact without any drainage or signs of infection. There is no surrounding erythema, ascending cellulitis, fluctuance, crepitus, malodor, drainage/purulence. There is mild edema around the surgical site. There is mild pain along the surgical site. Toes are in rectus position.  No other areas of tenderness to bilateral lower extremities.  No other open lesions or pre-ulcerative lesions.  No pain with calf compression, swelling, warmth, erythema.   Assessment and Plan:  Status post right hammertoe repair, doing well with no complications   -Treatment options discussed including all alternatives, risks, and complications -X-rays were obtained and reviewed with the patient. K wires are present. There appears to be in the fourth toe the K wire seen plantar to the toe however clinically the toes in rectus position. Therefore I will leave the K wires intact but discussed with him that we may remove the wires early. -Antibiotic on it was applied overlying the incision K wires and a dressing was applied. Daily dressing clean, dry, intact. -Ice/elevation -Pain medication as needed. -Monitor for any clinical signs or symptoms of infection and DVT/PE and directed to call the office immediately should any occur or go to the  ER. -Follow-up in 1 week for possible suture removal or sooner if any problems arise. In the meantime, encouraged to call the office with any questions, concerns, change in symptoms.   Celesta Gentile, DPM

## 2016-10-05 ENCOUNTER — Ambulatory Visit (INDEPENDENT_AMBULATORY_CARE_PROVIDER_SITE_OTHER): Payer: Medicare Other | Admitting: Podiatry

## 2016-10-05 ENCOUNTER — Encounter: Payer: Self-pay | Admitting: Podiatry

## 2016-10-05 DIAGNOSIS — Z09 Encounter for follow-up examination after completed treatment for conditions other than malignant neoplasm: Secondary | ICD-10-CM

## 2016-10-05 DIAGNOSIS — M2041 Other hammer toe(s) (acquired), right foot: Secondary | ICD-10-CM

## 2016-10-06 NOTE — Progress Notes (Signed)
Subjective: Wayne [name] is a 82 y.o. is seen today in office s/p right 3rd and 4th digit hammertoe repair preformed on 09/19/16. His pain is controlled. He has been wearing the surgical shoe. Denies any systemic complaints such as fevers, chills, nausea, vomiting. No calf pain, chest pain, shortness of breath.   Objective: General: No acute distress, AAOx3  DP/PT pulses palpable 2/4, CRT < 3 sec to all digits.  Protective sensation intact. Motor function intact.  Right foot: Incision is well coapted without any evidence of dehiscence and sutures are intact.K-wires intact without any drainage or signs of infection. There is no surrounding erythema, ascending cellulitis, fluctuance, crepitus, malodor, drainage/purulence. There is mild edema around the surgical site. There is decreased pain along the surgical site. Toes are in rectus position.  No other areas of tenderness to bilateral lower extremities.  No other open lesions or pre-ulcerative lesions.  No pain with calf compression, swelling, warmth, erythema.   Assessment and Plan:  Status post right hammertoe repair, doing well with no complications   -Treatment options discussed including all alternatives, risks, and complications -Sutures removed without complications.  -Antibiotic on it was applied overlying the incision K wires and a dressing was applied. Daily dressing clean, dry, intact. -Ice/elevation -Pain medication as needed. -Monitor for any clinical signs or symptoms of infection and DVT/PE and directed to call the office immediately should any occur or go to the ER. -Follow-up in 2 weeks or sooner if any problems arise. In the meantime, encouraged to call the office with any questions, concerns, change in symptoms.   Celesta Gentile, DPM

## 2016-10-09 DIAGNOSIS — Z961 Presence of intraocular lens: Secondary | ICD-10-CM | POA: Diagnosis not present

## 2016-10-09 DIAGNOSIS — H25811 Combined forms of age-related cataract, right eye: Secondary | ICD-10-CM | POA: Diagnosis not present

## 2016-10-09 DIAGNOSIS — H5703 Miosis: Secondary | ICD-10-CM | POA: Diagnosis not present

## 2016-10-09 DIAGNOSIS — H401131 Primary open-angle glaucoma, bilateral, mild stage: Secondary | ICD-10-CM | POA: Diagnosis not present

## 2016-10-19 ENCOUNTER — Encounter: Payer: Self-pay | Admitting: Podiatry

## 2016-10-19 ENCOUNTER — Ambulatory Visit (INDEPENDENT_AMBULATORY_CARE_PROVIDER_SITE_OTHER): Payer: Medicare Other | Admitting: Podiatry

## 2016-10-19 ENCOUNTER — Ambulatory Visit (INDEPENDENT_AMBULATORY_CARE_PROVIDER_SITE_OTHER): Payer: Medicare Other

## 2016-10-19 DIAGNOSIS — M2041 Other hammer toe(s) (acquired), right foot: Secondary | ICD-10-CM | POA: Diagnosis not present

## 2016-10-19 DIAGNOSIS — Z09 Encounter for follow-up examination after completed treatment for conditions other than malignant neoplasm: Secondary | ICD-10-CM

## 2016-10-22 NOTE — Progress Notes (Signed)
Subjective: Wayne [name] is a 82 y.o. is seen today in office s/p right 3rd and 4th digit hammertoe repair preformed on 09/19/16. He has been having pain to the 4th toe. He has remained in the surgical shoe. No redness or increase in swelling. Denies any systemic complaints such as fevers, chills, nausea, vomiting. No calf pain, chest pain, shortness of breath.   Objective: General: No acute distress, AAOx3  DP/PT pulses palpable 2/4, CRT < 3 sec to all digits.  Protective sensation intact. Motor function intact.  Right foot: Incision is well coapted without any evidence of dehiscence and sutures are intact.K-wires intact without any drainage or signs of infection. There is no surrounding erythema, ascending cellulitis, fluctuance, crepitus, malodor, drainage/purulence. There is minimal  edema around the surgical site. There is minimal pain along the surgical site and mostly to the 4th digit. Toes are in rectus position.  No other areas of tenderness to bilateral lower extremities.  No other open lesions or pre-ulcerative lesions.  No pain with calf compression, swelling, warmth, erythema.   Assessment and Plan:  Status post right hammertoe repair, doing well with no complications   -Treatment options discussed including all alternatives, risks, and complications -X-rays were obtained and reviewed with the patient. No evidence of acute fracture. K-wire in the 4th toe appears to be dorsal to the distal phalanx.  -Due to the x-ray finding and clinical pain with wire the 4th digit wire was removed today without complications in total. Antibiotic ointment was applied followed by a bandage. Keep clean, dry, intact.  -Continue surgical shoe.  -Ice/elevation -Pain medication prn -Monitor for any clinical signs or symptoms of infection and DVT/PE and directed to call the office immediately should any occur or go to the ER. -Follow-up in 2 weeks or sooner if any problems arise. In the meantime,  encouraged to call the office with any questions, concerns, change in symptoms.   *x-ray next appointment and removal of wire.   Celesta Gentile, DPM

## 2016-11-02 ENCOUNTER — Ambulatory Visit (INDEPENDENT_AMBULATORY_CARE_PROVIDER_SITE_OTHER): Payer: Medicare Other

## 2016-11-02 ENCOUNTER — Encounter: Payer: Self-pay | Admitting: Podiatry

## 2016-11-02 ENCOUNTER — Ambulatory Visit (INDEPENDENT_AMBULATORY_CARE_PROVIDER_SITE_OTHER): Payer: Medicare Other | Admitting: Podiatry

## 2016-11-02 VITALS — BP 147/68 | HR 72 | Resp 16

## 2016-11-02 DIAGNOSIS — Z09 Encounter for follow-up examination after completed treatment for conditions other than malignant neoplasm: Secondary | ICD-10-CM

## 2016-11-02 DIAGNOSIS — M2041 Other hammer toe(s) (acquired), right foot: Secondary | ICD-10-CM | POA: Diagnosis not present

## 2016-11-06 DIAGNOSIS — R600 Localized edema: Secondary | ICD-10-CM | POA: Diagnosis not present

## 2016-11-06 DIAGNOSIS — I10 Essential (primary) hypertension: Secondary | ICD-10-CM | POA: Diagnosis not present

## 2016-11-06 DIAGNOSIS — E7800 Pure hypercholesterolemia, unspecified: Secondary | ICD-10-CM | POA: Diagnosis not present

## 2016-11-06 DIAGNOSIS — I150 Renovascular hypertension: Secondary | ICD-10-CM | POA: Diagnosis not present

## 2016-11-08 DIAGNOSIS — R600 Localized edema: Secondary | ICD-10-CM | POA: Diagnosis not present

## 2016-11-08 NOTE — Progress Notes (Signed)
Subjective: Wayne [name] is a 82 y.o. is seen today in office s/p right 3rd and 4th digit hammertoe repair preformed on 09/19/16. He states he is doing well. He is continuing the surgical shoe. He is having no pain and surgical sites. Denies any increase in swelling or redness to his foot. Denies any systemic complaints such as fevers, chills, nausea, vomiting. No calf pain, chest pain, shortness of breath.   Objective: General: No acute distress, AAOx3  DP/PT pulses palpable 2/4, CRT < 3 sec to all digits.  Protective sensation intact. Motor function intact.  Right foot: Incision is well coapted without any evidence of dehiscence and sutures are intact. K-wires intact without any drainage or signs of infection to the third toe. There is no surrounding erythema, ascending cellulitis, fluctuance, crepitus, malodor, drainage/purulence. There is minimal  edema around the surgical site. Toes are in rectus position.  No other areas of tenderness to bilateral lower extremities.  No other open lesions or pre-ulcerative lesions.  No pain with calf compression, swelling, warmth, erythema.   Assessment and Plan:  Status post right hammertoe repair, doing well with no complications   -Treatment options discussed including all alternatives, risks, and complications -X-rays were obtained and reviewed with the patient. Hardware is been removed. Status post hammertoe repair. No evidence of acute fracture. -States that K wire to the third toe was removed today and total any complications. Antibiotic ointment was applied followed by a bandage. -Continue surgical shoe for now we can transition to regular shoe as tolerated -Continue ice and elevation limited activity; gradually increase activity as tolerated. -Monitor for any clinical signs or symptoms of infection and DVT/PE and directed to call the office immediately should any occur or go to the ER. -Follow-up as scheduled or sooner if any problems arise.  In the meantime, encouraged to call the office with any questions, concerns, change in symptoms.    Celesta Gentile, DPM

## 2016-11-30 ENCOUNTER — Ambulatory Visit (INDEPENDENT_AMBULATORY_CARE_PROVIDER_SITE_OTHER): Payer: Medicare Other

## 2016-11-30 ENCOUNTER — Ambulatory Visit (INDEPENDENT_AMBULATORY_CARE_PROVIDER_SITE_OTHER): Payer: Medicare Other | Admitting: Podiatry

## 2016-11-30 DIAGNOSIS — M779 Enthesopathy, unspecified: Secondary | ICD-10-CM | POA: Diagnosis not present

## 2016-11-30 DIAGNOSIS — Z09 Encounter for follow-up examination after completed treatment for conditions other than malignant neoplasm: Secondary | ICD-10-CM

## 2016-11-30 DIAGNOSIS — M2041 Other hammer toe(s) (acquired), right foot: Secondary | ICD-10-CM | POA: Diagnosis not present

## 2016-11-30 DIAGNOSIS — M7661 Achilles tendinitis, right leg: Secondary | ICD-10-CM

## 2016-11-30 NOTE — Progress Notes (Signed)
Subjective: Wayne [name] is a 82 y.o. is seen today in office s/p right 3rd and 4th digit hammertoe repair preformed on 09/19/16. He states he is doing well to the hammertoes. He does state that he is having some discomfort of the Achilles tendon and he is asking for a brace to wear. Denies any recent injury or trauma. No swelling or redness. No recent treatment for this. Denies any systemic complaints such as fevers, chills, nausea, vomiting. No calf pain, chest pain, shortness of breath.   Objective: General: No acute distress, AAOx3  DP/PT pulses palpable 2/4, CRT < 3 sec to all digits.  Protective sensation intact. Motor function intact.  Right foot: Incision is well coapted without any evidence of dehiscence and a scar has formed. There is trace edema to the toes there is no erythema. There is no pain to the toes. There is minimal discomfort on the Achilles tendon on the insertion. Soft and has is negative. No climb swelling. No pain lateral compression of the calcaneus. Mild discomfort on the sinus tarsi and lateral aspect of the foot. There is a severe flatfoot deformity present on the right side. Equinus is present. No other areas of tenderness.  No other open lesions or pre-ulcerative lesions.  No pain with calf compression, swelling, warmth, erythema.   Assessment and Plan:  Status post right hammertoe repair, doing well with no complications however with New concerns of Achilles tendinitis, likely some capsulitis  -Treatment options discussed including all alternatives, risks, and complications -X-rays were obtained and reviewed with the patient. No evidence of acute fracture. Status post hammertoe repair. -As far as the [employer] he is doing very well from this and would discharge him from the postoperative care. Continue supportive shoe gear. -Dispensed ankle brace at his request. Also recommended stretching exercises daily the Achilles tendon. Ice to the area and limited  activity. -Follow-up in 4 weeks or sooner if needed.   Celesta Gentile, DPM

## 2016-11-30 NOTE — Patient Instructions (Signed)

## 2016-12-11 DIAGNOSIS — I2510 Atherosclerotic heart disease of native coronary artery without angina pectoris: Secondary | ICD-10-CM | POA: Diagnosis not present

## 2016-12-11 DIAGNOSIS — Z0000 Encounter for general adult medical examination without abnormal findings: Secondary | ICD-10-CM | POA: Diagnosis not present

## 2016-12-11 DIAGNOSIS — E559 Vitamin D deficiency, unspecified: Secondary | ICD-10-CM | POA: Diagnosis not present

## 2016-12-11 DIAGNOSIS — R7309 Other abnormal glucose: Secondary | ICD-10-CM | POA: Diagnosis not present

## 2016-12-11 DIAGNOSIS — I1310 Hypertensive heart and chronic kidney disease without heart failure, with stage 1 through stage 4 chronic kidney disease, or unspecified chronic kidney disease: Secondary | ICD-10-CM | POA: Diagnosis not present

## 2016-12-11 DIAGNOSIS — N183 Chronic kidney disease, stage 3 (moderate): Secondary | ICD-10-CM | POA: Diagnosis not present

## 2017-01-02 DIAGNOSIS — M25562 Pain in left knee: Secondary | ICD-10-CM | POA: Diagnosis not present

## 2017-01-02 DIAGNOSIS — M1711 Unilateral primary osteoarthritis, right knee: Secondary | ICD-10-CM | POA: Diagnosis not present

## 2017-01-09 DIAGNOSIS — M1711 Unilateral primary osteoarthritis, right knee: Secondary | ICD-10-CM | POA: Diagnosis not present

## 2017-01-10 DIAGNOSIS — I272 Pulmonary hypertension, unspecified: Secondary | ICD-10-CM | POA: Diagnosis not present

## 2017-01-10 DIAGNOSIS — I2510 Atherosclerotic heart disease of native coronary artery without angina pectoris: Secondary | ICD-10-CM | POA: Diagnosis not present

## 2017-01-10 DIAGNOSIS — R600 Localized edema: Secondary | ICD-10-CM | POA: Diagnosis not present

## 2017-01-10 DIAGNOSIS — I150 Renovascular hypertension: Secondary | ICD-10-CM | POA: Diagnosis not present

## 2017-01-16 DIAGNOSIS — M1711 Unilateral primary osteoarthritis, right knee: Secondary | ICD-10-CM | POA: Diagnosis not present

## 2017-01-23 DIAGNOSIS — M1711 Unilateral primary osteoarthritis, right knee: Secondary | ICD-10-CM | POA: Diagnosis not present

## 2017-01-30 DIAGNOSIS — M1711 Unilateral primary osteoarthritis, right knee: Secondary | ICD-10-CM | POA: Diagnosis not present

## 2017-02-06 DIAGNOSIS — Z86010 Personal history of colonic polyps: Secondary | ICD-10-CM | POA: Diagnosis not present

## 2017-02-06 DIAGNOSIS — I10 Essential (primary) hypertension: Secondary | ICD-10-CM | POA: Diagnosis not present

## 2017-02-06 DIAGNOSIS — K219 Gastro-esophageal reflux disease without esophagitis: Secondary | ICD-10-CM | POA: Diagnosis not present

## 2017-03-05 DIAGNOSIS — M79641 Pain in right hand: Secondary | ICD-10-CM | POA: Diagnosis not present

## 2017-03-05 DIAGNOSIS — M25561 Pain in right knee: Secondary | ICD-10-CM | POA: Diagnosis not present

## 2017-03-05 DIAGNOSIS — M25562 Pain in left knee: Secondary | ICD-10-CM | POA: Diagnosis not present

## 2017-03-05 DIAGNOSIS — W01198A Fall on same level from slipping, tripping and stumbling with subsequent striking against other object, initial encounter: Secondary | ICD-10-CM | POA: Diagnosis not present

## 2017-03-06 ENCOUNTER — Encounter (INDEPENDENT_AMBULATORY_CARE_PROVIDER_SITE_OTHER): Payer: Self-pay

## 2017-03-06 ENCOUNTER — Encounter: Payer: Self-pay | Admitting: Diagnostic Neuroimaging

## 2017-03-06 ENCOUNTER — Ambulatory Visit (INDEPENDENT_AMBULATORY_CARE_PROVIDER_SITE_OTHER): Payer: Medicare Other | Admitting: Diagnostic Neuroimaging

## 2017-03-06 VITALS — BP 153/86 | HR 69 | Ht 67.0 in | Wt 170.4 lb

## 2017-03-06 DIAGNOSIS — G5603 Carpal tunnel syndrome, bilateral upper limbs: Secondary | ICD-10-CM | POA: Diagnosis not present

## 2017-03-06 DIAGNOSIS — M5412 Radiculopathy, cervical region: Secondary | ICD-10-CM | POA: Diagnosis not present

## 2017-03-06 NOTE — Progress Notes (Signed)
[facility]  PATIENT: Wayne [name] DOB: 06/15/1926  REFERRING CLINICIAN: Baird Cancer  HISTORY FROM: patient  REASON FOR VISIT: follow up   HISTORICAL  CHIEF COMPLAINT:  Chief Complaint  Patient presents with  . Follow-up  . Neck Pain    still with L hand numbness and starting on right hand.    HISTORY OF PRESENT ILLNESS:   UPDATE 03/06/17: Since last visit, sxs continue and slightly worsened. Still with left hand numbness, cold feeling esp at night. No major pain. No weakness. Wearing wrist splints at bedtime.   PRIOR HPI (06/03/15): 83 year old right-handed male here for evaluation of numbness and tingling in the hands. For past 1-2 years patient has had intermittent, progressively worsening, numbness and tingling in his arms, hands, fingers, especially digits 1-3. Left hand is more affected than the right. He notices when he puts his arm on arm rests are hard surfaces that this seems to trigger the pain and numbness. He has purchased elbow pads and has tried to avoid providing pressure on his arms but this has not relieved his symptoms. No problems in his toes or feet.   REVIEW OF SYSTEMS: Full 14 system review of systems performed and negative except: joint pain back pain freq urination blurred vision hearing loss. History of hypertension. History of right carpal tunnel surgery in 1960s.   ALLERGIES: Allergies  Allergen Reactions  . Cephalexin     Severe mouth and throat dryness.    HOME MEDICATIONS: Outpatient Medications Prior to Visit  Medication Sig Dispense Refill  . amLODipine (NORVASC) 5 MG tablet Take 5 mg by mouth daily.     Marland Kitchen aspirin EC 81 MG tablet Take 81 mg by mouth daily.    Marland Kitchen HYDROcodone-acetaminophen (NORCO/VICODIN) 5-325 MG tablet Take 1 tablet by mouth every 4 (four) hours as needed for moderate pain.    Marland Kitchen HYDROcodone-acetaminophen (NORCO/VICODIN) 5-325 MG tablet Take 1 tablet by mouth every 4 (four) hours as needed. 30 tablet 0  .  ketoconazole (NIZORAL) 2 % cream Apply 1 application topically 2 (two) times daily. Applies to face.  0  . Multiple Vitamin (MULTIVITAMIN WITH MINERALS) TABS tablet Take 1 tablet by mouth daily.    . Omega-3 Fatty Acids (FISH OIL) 1200 MG CAPS Take 1 capsule by mouth 2 (two) times daily.    . pravastatin (PRAVACHOL) 40 MG tablet Take 40 mg by mouth daily.    . tamsulosin (FLOMAX) 0.4 MG CAPS capsule Take 0.4 mg by mouth daily.   1  . timolol (BETIMOL) 0.5 % ophthalmic solution Place 1 drop into both eyes daily.     . traMADol (ULTRAM) 50 MG tablet TAKE 1 TABLET BY MOUTH EVERY 6 HOURS AS NEEDED FOR PAIN. MAX 4 TABS PER DAY  0  . Vitamin D, Ergocalciferol, (DRISDOL) 50000 UNITS CAPS capsule Take 50,000 Units by mouth every 7 (seven) days. Takes on Monday    . benzonatate (TESSALON) 100 MG capsule Take 1 capsule (100 mg total) by mouth every 8 (eight) hours. (Patient not taking: Reported on 03/06/2017) 21 capsule 0  . cephALEXin (KEFLEX) 500 MG capsule Take 500 mg by mouth 3 (three) times daily.    . feeding supplement, ENSURE ENLIVE, (ENSURE ENLIVE) LIQD Take 237 mLs by mouth 2 (two) times daily between meals. (Patient not taking: Reported on 03/06/2017) 237 mL 12  . ondansetron (ZOFRAN) 4 MG tablet Take 1 tablet (4 mg total) by mouth every 6 (six) hours as needed for nausea. (Patient not taking:  Reported on 03/06/2017) 20 tablet 0  . oxybutynin (DITROPAN) 5 MG tablet Take 5 mg by mouth 3 (three) times daily.    . promethazine (PHENERGAN) 12.5 MG tablet Take 12.5 mg by mouth every 8 (eight) hours as needed for nausea or vomiting.     No facility-administered medications prior to visit.     PAST MEDICAL HISTORY: No past medical history on file.  PAST SURGICAL HISTORY: Past Surgical History:  Procedure Laterality Date  . APPENDECTOMY    . BACK SURGERY    . ballon angioplasty  03/18/2012  . FOOT SURGERY    . kidney stent Left 2015  . KNEE SURGERY Left x 2   . left ankle    . LEFT HEART  CATHETERIZATION WITH CORONARY ANGIOGRAM N/A 03/18/2012   Procedure: LEFT HEART CATHETERIZATION WITH CORONARY ANGIOGRAM;  Surgeon: Laverda Page, MD;  Location[facility] C[facility] LAB;  Service: Cardiovascular;  Laterality: N/A;  . RENAL ANGIOGRAM N/A 03/18/2012   Procedure: RENAL ANGIOGRAM;  Surgeon: Laverda Page, MD;  Location: [facility] CATH LAB;  Service: Cardiovascular;  Laterality: N/A;  . right wrist      FAMILY HISTORY: Family History  Problem Relation Age of Onset  . Dementia Mother   . Cancer Father     SOCIAL HISTORY:  Social History   Social History  . Marital status: Widowed    Spouse name: N/A  . Number of children: 2  . Years of education: 12.5   Occupational History  . retired     Astronomer   Social History Main Topics  . Smoking status: Former Smoker    Quit date: 06/02/1972  . Smokeless tobacco: Never Used  . Alcohol use Yes     Comment: rare  . Drug use: No  . Sexual activity: Not Currently   Other Topics Concern  . Not on file   Social History Narrative   Lives at home by himself   Caffeine use- none     PHYSICAL EXAM  GENERAL EXAM/CONSTITUTIONAL: Vitals:  Vitals:   03/06/17 1318  BP: (!) 153/86  Pulse: 69  Weight: 170 lb 6.4 oz (77.3 kg)  Height: 5\' 7"  (1.702 m)   Body mass index is 26.69 kg/m. No exam data present  Patient is in no distress; well developed, nourished and groomed; NECK HAS DECR ROM  CARDIOVASCULAR:  Examination of carotid arteries is normal; no carotid bruits  Regular rate and rhythm, no murmurs  Examination of peripheral vascular system by observation and palpation is normal  EYES:  Ophthalmoscopic exam of optic discs and posterior segments is normal; no papilledema or hemorrhages  MUSCULOSKELETAL:  Gait, strength, tone, movements noted in Neurologic exam below  NEUROLOGIC: MENTAL STATUS:  No flowsheet data found.  awake, alert, oriented to person, place and time  recent and remote memory intact  normal  attention and concentration  language fluent, comprehension intact, naming intact,   fund of knowledge appropriate  CRANIAL NERVE:   2nd - no papilledema on fundoscopic exam  2nd, 3rd, 4th, 6th - pupils equal and reactive to light, visual fields full to confrontation, extraocular muscles intact, no nystagmus  5th - facial sensation symmetric  7th - facial strength symmetric --> RARE LEFT HEMIFACIAL SPASM  8th - hearing intact  9th - palate elevates symmetrically, uvula midline  11th - shoulder shrug symmetric  12th - tongue protrusion midline  MOTOR:   normal bulk and tone, full strength in the BUE, BLE; EXCEPT ATROPHY AND WEAKNESS OF BILATERAL  APB / THENAR MUSCLES. LIMITED ROM IN RIGHT WRIST DUE TO ARTHRITIS; SCAR FROM PRIOR RIGHT CARPAL TUNNEL SURGERY/RELEASE  SENSORY:   normal and symmetric to light touch, temperature; ABSENT VIB AT TOES/ANKLES; NEG PHALENS; NEG TINELS; DECR PP IN LEFT HAND DIGITS 2-3  COORDINATION:   finger-nose-finger, fine finger movements normal  REFLEXES:   deep tendon reflexes present and symmetric; SLIGHTLY BRISK IN ARMS (2-3); KNEES 1; ANKLES ABSENT  GAIT/STATION:   narrow based gait; STABLE    DIAGNOSTIC DATA (LABS, IMAGING, TESTING) - I reviewed patient records, labs, notes, testing and imaging myself where available.  Lab Results  Component Value Date   WBC 3.4 (L) 11/23/2015   HGB 10.9 (L) 11/23/2015   HCT 32.3 (L) 11/23/2015   MCV 99.1 11/23/2015   PLT 147 (L) 11/23/2015      Component Value Date/Time   NA 138 11/23/2015 0517   K 3.9 11/23/2015 0517   CL 109 11/23/2015 0517   CO2 21 (L) 11/23/2015 0517   GLUCOSE 95 11/23/2015 0517   BUN 24 (H) 11/23/2015 0517   CREATININE 1.29 (H) 11/23/2015 0517   CALCIUM 8.0 (L) 11/23/2015 0517   PROT 7.1 11/22/2015 1348   ALBUMIN 3.7 11/22/2015 1348   AST 31 11/22/2015 1348   ALT 15 (L) 11/22/2015 1348   ALKPHOS 70 11/22/2015 1348   BILITOT 0.8 11/22/2015 1348   GFRNONAA 48  (L) 11/23/2015 0517   GFRAA 56 (L) 11/23/2015 0517   No results found for: CHOL, HDL, LDLCALC, LDLDIRECT, TRIG, CHOLHDL No results found for: HGBA1C No results found for: VITAMINB12 Lab Results  Component Value Date   TSH 0.999 11/22/2015    03/31/14 CT HEAD [I reviewed images myself and agree with interpretation. -VRP]  1. Small rounded area of low attenuation in the peripheral aspect of the right cerebellar hemisphere, possibly representing foliar prominence related to atrophy. Difficult to exclude an infarct. 2. No additional evidence of an acute intracranial abnormality. 3. Atrophy and chronic microvascular white matter ischemic changes.  03/31/14 CT CERVICAL SPINE [I reviewed images myself and agree with interpretation. -VRP]  - Advanced cervical spondylosis without fracture or subluxation.  06/15/15 MRI cervical spine (without) demonstrating: 1. At C4-5, C5-6: disc bulging and uncovertebral joint hypertrophy with mild spinal stenosis and severe biforaminal stenosis  2. At C3-4: anterior and posterior bone bridging, uncovertebral joint hypertrophy with severe left foraminal stenosis 3. At C6-7: disc bulging with moderate biforaminal stenosis   06/16/15 EMG/NCS  1. Bilateral cervical radiculopathies (C5, C6). 2. Bilateral median neuropathies at the wrists consistent with bilateral carpal tunnel syndrome.     ASSESSMENT AND PLAN  83 y.o. year old male here with progressively worsening and intermittent numbness and tingling in his arms, hands, since 2014. Exam notable for weakness and atrophy of bilateral thenar muscles, as well as hyperreflexia in the upper extremities.  Based on patient's symptoms, physical exam findings, test results, I think he has significant cervical polyradiculopathy accounting for majority of his symptoms. MRI cervical spine confirms this. Electrodiagnostic testing confirms this as well as shows evidence of superimposed bilateral carpal tunnel syndrome.     Dx: cervical polyradiculopathy + bilateral carpal tunnel syndrome  Bilateral carpal tunnel syndrome  Cervical radiculopathy    PLAN:  CERVICAL POLYRADICULOPATHY (DEGENERATIVE) - I reviewed options for patient. He is not interested in cervical spine surgery at this time. He is also needle phobic and does not want to pursue epidural steroidal injections for the neck.   BILATERAL CARPAL TUNNEL SYNDROME (  LEFT MORE SEVERE THAN RIGHT; s/p RIGHT CTS release in 1960) - We discussed carpal tunnel syndrome treatment options including steroid injections versus carpal tunnel release surgery. He would like to try steroid injections and discuss options with hand surgeon.  Orders Placed This Encounter  Procedures  . Ambulatory referral to Hand Surgery   Return if symptoms worsen or fail to improve, for refer to hand surgery.    Penni Bombard, MD 6/0/6301, 6:01 PM Certified in Neurology, Neurophysiology and Neuroimaging  [facility] [address], [place], [place] [address] [phone]

## 2017-04-09 DIAGNOSIS — I2510 Atherosclerotic heart disease of native coronary artery without angina pectoris: Secondary | ICD-10-CM | POA: Diagnosis not present

## 2017-04-09 DIAGNOSIS — N182 Chronic kidney disease, stage 2 (mild): Secondary | ICD-10-CM | POA: Diagnosis not present

## 2017-04-09 DIAGNOSIS — M1990 Unspecified osteoarthritis, unspecified site: Secondary | ICD-10-CM | POA: Diagnosis not present

## 2017-04-09 DIAGNOSIS — G5603 Carpal tunnel syndrome, bilateral upper limbs: Secondary | ICD-10-CM | POA: Diagnosis not present

## 2017-04-09 DIAGNOSIS — I1310 Hypertensive heart and chronic kidney disease without heart failure, with stage 1 through stage 4 chronic kidney disease, or unspecified chronic kidney disease: Secondary | ICD-10-CM | POA: Diagnosis not present

## 2017-04-18 DIAGNOSIS — H25811 Combined forms of age-related cataract, right eye: Secondary | ICD-10-CM | POA: Diagnosis not present

## 2017-04-18 DIAGNOSIS — H2511 Age-related nuclear cataract, right eye: Secondary | ICD-10-CM | POA: Diagnosis not present

## 2017-04-18 DIAGNOSIS — H338 Other retinal detachments: Secondary | ICD-10-CM | POA: Diagnosis not present

## 2017-04-18 DIAGNOSIS — H5703 Miosis: Secondary | ICD-10-CM | POA: Diagnosis not present

## 2017-04-18 DIAGNOSIS — H401131 Primary open-angle glaucoma, bilateral, mild stage: Secondary | ICD-10-CM | POA: Diagnosis not present

## 2017-04-18 DIAGNOSIS — H04123 Dry eye syndrome of bilateral lacrimal glands: Secondary | ICD-10-CM | POA: Diagnosis not present

## 2017-04-29 DIAGNOSIS — H2511 Age-related nuclear cataract, right eye: Secondary | ICD-10-CM | POA: Diagnosis not present

## 2017-04-29 DIAGNOSIS — H25011 Cortical age-related cataract, right eye: Secondary | ICD-10-CM | POA: Diagnosis not present

## 2017-05-06 DIAGNOSIS — G5602 Carpal tunnel syndrome, left upper limb: Secondary | ICD-10-CM | POA: Diagnosis not present

## 2017-05-06 DIAGNOSIS — G5601 Carpal tunnel syndrome, right upper limb: Secondary | ICD-10-CM | POA: Diagnosis not present

## 2017-06-06 DIAGNOSIS — G5601 Carpal tunnel syndrome, right upper limb: Secondary | ICD-10-CM | POA: Diagnosis not present

## 2017-06-06 DIAGNOSIS — G5602 Carpal tunnel syndrome, left upper limb: Secondary | ICD-10-CM | POA: Diagnosis not present

## 2017-06-15 DIAGNOSIS — M1711 Unilateral primary osteoarthritis, right knee: Secondary | ICD-10-CM | POA: Diagnosis not present

## 2017-06-19 DIAGNOSIS — M25571 Pain in right ankle and joints of right foot: Secondary | ICD-10-CM | POA: Diagnosis not present

## 2017-06-19 DIAGNOSIS — M7661 Achilles tendinitis, right leg: Secondary | ICD-10-CM | POA: Diagnosis not present

## 2017-06-21 ENCOUNTER — Other Ambulatory Visit: Payer: Self-pay

## 2017-06-24 DIAGNOSIS — M25561 Pain in right knee: Secondary | ICD-10-CM | POA: Diagnosis not present

## 2017-06-28 DIAGNOSIS — M79672 Pain in left foot: Secondary | ICD-10-CM | POA: Diagnosis not present

## 2017-06-28 DIAGNOSIS — M79671 Pain in right foot: Secondary | ICD-10-CM | POA: Diagnosis not present

## 2017-08-10 ENCOUNTER — Encounter (HOSPITAL_COMMUNITY): Payer: Self-pay | Admitting: Emergency Medicine

## 2017-08-10 ENCOUNTER — Emergency Department (HOSPITAL_COMMUNITY)
Admission: EM | Admit: 2017-08-10 | Discharge: 2017-08-10 | Disposition: A | Discharge status: Home / Self Care | Payer: Medicare Other | Attending: Emergency Medicine | Admitting: Emergency Medicine

## 2017-08-10 ENCOUNTER — Emergency Department (HOSPITAL_COMMUNITY): Payer: Medicare Other

## 2017-08-10 DIAGNOSIS — J069 Acute upper respiratory infection, unspecified: Secondary | ICD-10-CM | POA: Insufficient documentation

## 2017-08-10 DIAGNOSIS — Z87891 Personal history of nicotine dependence: Secondary | ICD-10-CM | POA: Diagnosis not present

## 2017-08-10 DIAGNOSIS — B9789 Other viral agents as the cause of diseases classified elsewhere: Secondary | ICD-10-CM | POA: Diagnosis not present

## 2017-08-10 DIAGNOSIS — R05 Cough: Secondary | ICD-10-CM | POA: Diagnosis not present

## 2017-08-10 LAB — COMPREHENSIVE METABOLIC PANEL
ALT: 18 U/L (ref 17–63)
AST: 31 U/L (ref 15–41)
Albumin: 3.6 g/dL (ref 3.5–5.0)
Alkaline Phosphatase: 70 U/L (ref 38–126)
Anion gap: 11 (ref 5–15)
BUN: 22 mg/dL — AB (ref 6–20)
CALCIUM: 9.3 mg/dL (ref 8.9–10.3)
CO2: 26 mmol/L (ref 22–32)
CREATININE: 1.28 mg/dL — AB (ref 0.61–1.24)
Chloride: 104 mmol/L (ref 101–111)
GFR calc non Af Amer: 48 mL/min — ABNORMAL LOW (ref >60)
GFR, EST AFRICAN AMERICAN: 56 mL/min — AB (ref >60)
Glucose, Bld: 85 mg/dL (ref 65–99)
Potassium: 4.1 mmol/L (ref 3.5–5.1)
SODIUM: 141 mmol/L (ref 135–145)
Total Bilirubin: 0.7 mg/dL (ref 0.3–1.2)
Total Protein: 7.2 g/dL (ref 6.5–8.1)

## 2017-08-10 LAB — CBC WITH DIFFERENTIAL/PLATELET
Basophils Absolute: 0 10*3/uL (ref 0.0–0.1)
Basophils Relative: 1 %
EOS ABS: 0.2 10*3/uL (ref 0.0–0.7)
EOS PCT: 4 %
HCT: 38.9 % — ABNORMAL LOW (ref 39.0–52.0)
HEMOGLOBIN: 13 g/dL (ref 13.0–17.0)
Lymphocytes Relative: 40 %
Lymphs Abs: 1.9 10*3/uL (ref 0.7–4.0)
MCH: 33.4 pg (ref 26.0–34.0)
MCHC: 33.4 g/dL (ref 30.0–36.0)
MCV: 100 fL (ref 78.0–100.0)
MONOS PCT: 16 %
Monocytes Absolute: 0.8 10*3/uL (ref 0.1–1.0)
NEUTROS PCT: 39 %
Neutro Abs: 1.9 10*3/uL (ref 1.7–7.7)
PLATELETS: 212 10*3/uL (ref 150–400)
RBC: 3.89 MIL/uL — ABNORMAL LOW (ref 4.22–5.81)
RDW: 12.9 % (ref 11.5–15.5)
WBC: 4.7 10*3/uL (ref 4.0–10.5)

## 2017-08-10 MED ORDER — BENZONATATE 100 MG PO CAPS: 100.0000 mg | ORAL_CAPSULE | Freq: Three times a day (TID) | ORAL | 0 refills | Status: DC

## 2017-08-10 NOTE — Discharge Instructions (Signed)
You have been seen in the Emergency Department (ED) today for a likely viral illness.  Please drink plenty of clear fluids (water, Gatorade, chicken broth, etc).  You may use Tylenol according to label instructions.  You can alternate between the two without any side effects.   Please follow up with your doctor as listed above.  Call your doctor or return to the Emergency Department (ED) if you are unable to tolerate fluids due to vomiting, have worsening trouble breathing, become extremely tired or difficult to awaken, or if you develop any other symptoms that concern you.

## 2017-08-10 NOTE — ED Provider Notes (Signed)
Emergency Department Provider Note   I have reviewed the triage vital signs and the nursing notes.   HISTORY  Chief Complaint Cough   HPI Wayne ISHMAN is a 83 y.o. male with PMH of CKD, HTN, and HLD Presents to the emergency department for evaluation of cough, runny nose, watery eyes, chills, sore throat, and generalized fatigue. Symptoms been ongoing for the last 4 days. Not worsening significantly. Denies any chest pain but does have coughing. No nausea or vomiting. Some mild diarrhea. No modifying factors. No radiation of symptoms. Patient eating and drinking well.    History reviewed. No pertinent past medical history.  Patient Active Problem List   Diagnosis Date Noted  . Hammertoe 07/01/2016  . Anemia of chronic renal failure, stage 3 (moderate) 11/23/2015  . Dehydration 11/22/2015  . CKD (chronic kidney disease) stage 3, GFR 30-59 ml/min 11/22/2015  . Sepsis ([place]) 11/22/2015  . Diarrhea 11/22/2015  . Leukopenia 11/22/2015  . Benign essential HTN 11/22/2015  . Protein-calorie malnutrition, severe ([place]) 04/01/2014  . Coronary artery disease involving native coronary artery without angina pectoris 03/18/2012  . Hyperlipidemia 03/18/2012    Past Surgical History:  Procedure Laterality Date  . APPENDECTOMY    . BACK SURGERY    . ballon angioplasty  03/18/2012  . FOOT SURGERY    . kidney stent Left 2015  . KNEE SURGERY Left x 2   . left ankle    . LEFT HEART CATHETERIZATION WITH CORONARY ANGIOGRAM N/A 03/18/2012   Procedure: LEFT HEART CATHETERIZATION WITH CORONARY ANGIOGRAM;  Surgeon: Laverda Page, MD;  Location: [facility] CATH LAB;  Service: Cardiovascular;  Laterality: N/A;  . RENAL ANGIOGRAM N/A 03/18/2012   Procedure: RENAL ANGIOGRAM;  Surgeon: Laverda Page, MD;  Location: [facility] CATH LAB;  Service: Cardiovascular;  Laterality: N/A;  . right wrist      Current Outpatient Rx  . Order #: [id number] Class: Historical Med  . Order #: [id number] Class: Historical Med   . Order #: [id number] Class: Historical Med  . Order #: [id number] Class: Historical Med  . Order #: [id number] Class: Historical Med  . Order #: [id number] Class: Historical Med  . Order #: [id number] Class: Historical Med  . Order #: [id number] Class: Historical Med  . Order #: [id number] Class: Historical Med  . Order #: [id number] Class: Historical Med  . Order #: [id number] Class: Print  . Order #: [id number] Class: Print    Allergies Cephalexin  Family History  Problem Relation Age of Onset  . Dementia Mother   . Cancer Father     Social History Social History  Substance Use Topics  . Smoking status: Former Smoker    Quit date: 06/02/1972  . Smokeless tobacco: Never Used  . Alcohol use Yes     Comment: rare    Review of Systems  Constitutional: No fever. Positive chills.  Eyes: No visual changes. Positive watery eyes.  ENT: Positive sore throat. Cardiovascular: Denies chest pain. Respiratory: Denies shortness of breath. Positive cough.  Gastrointestinal: No abdominal pain.  No nausea, no vomiting.  No diarrhea.  No constipation. Genitourinary: Negative for dysuria. Musculoskeletal: Negative for back pain. Skin: Negative for rash. Neurological: Negative for headaches, focal weakness or numbness.  10-point ROS otherwise negative.  ____________________________________________   PHYSICAL EXAM:  VITAL SIGNS: ED Triage Vitals  Enc Vitals Group     BP 08/10/17 1202 116/70     Pulse Rate 08/10/17 1202 60     Resp 08/10/17 1202 18     Temp 08/10/17 1202 98 F (36.7 C)  Temp Source 08/10/17 1202 Oral     SpO2 08/10/17 1202 96 %     Weight 08/10/17 1202 160 lb (72.6 kg)     Height 08/10/17 1202 5\' 7"  (1.702 m)     Pain Score 08/10/17 1217 0   Constitutional: Alert and oriented. Well appearing and in no acute distress. Eyes: Conjunctivae are normal. Head: Atraumatic. Nose: No congestion/rhinnorhea. Mouth/Throat: Mucous membranes are moist.  Oropharynx with mild erythema.    Neck: No stridor.   Cardiovascular: Normal rate, regular rhythm. Good peripheral circulation. Grossly normal heart sounds.   Respiratory: Normal respiratory effort.  No retractions. Lungs CTAB. Gastrointestinal: Soft and nontender. No distention.  Musculoskeletal: No lower extremity tenderness nor edema. No gross deformities of extremities. Neurologic:  Normal speech and language. No gross focal neurologic deficits are appreciated.  Skin:  Skin is warm, dry and intact. No rash noted.  ____________________________________________   LABS (all labs ordered are listed, but only abnormal results are displayed)  Labs Reviewed  COMPREHENSIVE METABOLIC PANEL - Abnormal; Notable for the following:       Result Value   BUN 22 (*)    Creatinine, Ser 1.28 (*)    GFR calc non Af Amer 48 (*)    GFR calc Af Amer 56 (*)    All other components within normal limits  CBC WITH DIFFERENTIAL/PLATELET - Abnormal; Notable for the following:    RBC 3.89 (*)    HCT 38.9 (*)    All other components within normal limits   ____________________________________________  RADIOLOGY  Dg Chest 2 View  Result Date: 08/10/2017 CLINICAL DATA:  Productive cough EXAM: CHEST  2 VIEW COMPARISON:  11/21/2015 FINDINGS: No acute consolidation or pleural effusion is seen. Left pleural opacity is unchanged. Mild volume loss on the left. Stable cardiomediastinal silhouette. No pneumothorax. IMPRESSION: No active cardiopulmonary disease. Electronically Signed   By: Donavan Foil M.D.   On: 08/10/2017 18:32    ____________________________________________   PROCEDURES  Procedure(s) performed:   Procedures  None ____________________________________________   INITIAL IMPRESSION / ASSESSMENT AND PLAN / ED COURSE  Pertinent labs & imaging results that were available during my care of the patient were reviewed by me and considered in my medical decision making (see chart for details).  Patient presents to the ED with  URI symptoms and productive cough. Patient is well appearing. Plan for basic labs and CXR.   Labs and x-ray unremarkable. On reevaluation the patient is feeling much better. No pneumonia. Plan for symptom treatment with Tessalon for cough.   At this time, I do not feel there is any life-threatening condition present. I have reviewed and discussed all results (EKG, imaging, lab, urine as appropriate), exam findings with patient. I have reviewed nursing notes and appropriate previous records.  I feel the patient is safe to be discharged home without further emergent workup. Discussed usual and customary return precautions. Patient and family (if present) verbalize understanding and are comfortable with this plan.  Patient will follow-up with their primary care provider. If they do not have a primary care provider, information for follow-up has been provided to them. All questions have been answered.  ____________________________________________  FINAL CLINICAL IMPRESSION(S) / ED DIAGNOSES  Final diagnoses:  Viral URI with cough     MEDICATIONS GIVEN DURING THIS VISIT:  Medications - No data to display   NEW OUTPATIENT MEDICATIONS STARTED DURING THIS VISIT:  Discharge Medication List as of 08/10/2017  7:25 PM    START taking these medications  Details  benzonatate (TESSALON) 100 MG capsule Take 1 capsule (100 mg total) by mouth every 8 (eight) hours., Starting Sat 08/10/2017, Print        Note:  This document was prepared using Dragon voice recognition software and may include unintentional dictation errors.  Nanda Quinton, MD Emergency Medicine    Long, [facility], MD 08/10/17 [phone]

## 2017-08-10 NOTE — ED Triage Notes (Addendum)
Pt reports he has had a productive cough, runny nose, watery eyes, chills, and generalized malaise since Wednesday.  Denies CP, SOB, lightheadedness, or dizziness.

## 2017-08-10 NOTE — ED Notes (Signed)
Bed: PZ98 Expected date:  Expected time:  Means of arrival:  Comments: EVS

## 2017-09-02 DIAGNOSIS — G5602 Carpal tunnel syndrome, left upper limb: Secondary | ICD-10-CM | POA: Diagnosis not present

## 2017-09-06 DIAGNOSIS — Z23 Encounter for immunization: Secondary | ICD-10-CM | POA: Diagnosis not present

## 2017-09-16 DIAGNOSIS — G5602 Carpal tunnel syndrome, left upper limb: Secondary | ICD-10-CM | POA: Diagnosis not present

## 2017-10-07 DIAGNOSIS — M1711 Unilateral primary osteoarthritis, right knee: Secondary | ICD-10-CM | POA: Diagnosis not present

## 2017-10-09 DIAGNOSIS — Z79899 Other long term (current) drug therapy: Secondary | ICD-10-CM | POA: Diagnosis not present

## 2017-10-09 DIAGNOSIS — N183 Chronic kidney disease, stage 3 (moderate): Secondary | ICD-10-CM | POA: Diagnosis not present

## 2017-10-09 DIAGNOSIS — I1310 Hypertensive heart and chronic kidney disease without heart failure, with stage 1 through stage 4 chronic kidney disease, or unspecified chronic kidney disease: Secondary | ICD-10-CM | POA: Diagnosis not present

## 2017-10-09 DIAGNOSIS — E785 Hyperlipidemia, unspecified: Secondary | ICD-10-CM | POA: Diagnosis not present

## 2017-10-09 DIAGNOSIS — I2510 Atherosclerotic heart disease of native coronary artery without angina pectoris: Secondary | ICD-10-CM | POA: Diagnosis not present

## 2017-10-14 DIAGNOSIS — G5602 Carpal tunnel syndrome, left upper limb: Secondary | ICD-10-CM | POA: Diagnosis not present

## 2017-10-14 DIAGNOSIS — M1711 Unilateral primary osteoarthritis, right knee: Secondary | ICD-10-CM | POA: Diagnosis not present

## 2017-10-21 DIAGNOSIS — M1711 Unilateral primary osteoarthritis, right knee: Secondary | ICD-10-CM | POA: Diagnosis not present

## 2017-11-18 DIAGNOSIS — N400 Enlarged prostate without lower urinary tract symptoms: Secondary | ICD-10-CM | POA: Diagnosis not present

## 2017-12-12 DIAGNOSIS — G5602 Carpal tunnel syndrome, left upper limb: Secondary | ICD-10-CM | POA: Diagnosis not present

## 2017-12-18 DIAGNOSIS — H401131 Primary open-angle glaucoma, bilateral, mild stage: Secondary | ICD-10-CM | POA: Diagnosis not present

## 2017-12-20 DIAGNOSIS — I2510 Atherosclerotic heart disease of native coronary artery without angina pectoris: Secondary | ICD-10-CM | POA: Diagnosis not present

## 2017-12-20 DIAGNOSIS — I1310 Hypertensive heart and chronic kidney disease without heart failure, with stage 1 through stage 4 chronic kidney disease, or unspecified chronic kidney disease: Secondary | ICD-10-CM | POA: Diagnosis not present

## 2017-12-20 DIAGNOSIS — N183 Chronic kidney disease, stage 3 (moderate): Secondary | ICD-10-CM | POA: Diagnosis not present

## 2018-02-17 DIAGNOSIS — N183 Chronic kidney disease, stage 3 (moderate): Secondary | ICD-10-CM | POA: Diagnosis not present

## 2018-02-17 DIAGNOSIS — I129 Hypertensive chronic kidney disease with stage 1 through stage 4 chronic kidney disease, or unspecified chronic kidney disease: Secondary | ICD-10-CM | POA: Diagnosis not present

## 2018-02-17 DIAGNOSIS — M1990 Unspecified osteoarthritis, unspecified site: Secondary | ICD-10-CM | POA: Diagnosis not present

## 2018-02-17 DIAGNOSIS — I2510 Atherosclerotic heart disease of native coronary artery without angina pectoris: Secondary | ICD-10-CM | POA: Diagnosis not present

## 2018-02-17 DIAGNOSIS — R7309 Other abnormal glucose: Secondary | ICD-10-CM | POA: Diagnosis not present

## 2018-02-17 DIAGNOSIS — I1310 Hypertensive heart and chronic kidney disease without heart failure, with stage 1 through stage 4 chronic kidney disease, or unspecified chronic kidney disease: Secondary | ICD-10-CM | POA: Diagnosis not present

## 2018-02-20 ENCOUNTER — Ambulatory Visit (INDEPENDENT_AMBULATORY_CARE_PROVIDER_SITE_OTHER): Payer: Medicare Other

## 2018-02-20 ENCOUNTER — Encounter: Payer: Self-pay | Admitting: Podiatry

## 2018-02-20 ENCOUNTER — Other Ambulatory Visit: Payer: Self-pay | Admitting: Podiatry

## 2018-02-20 ENCOUNTER — Ambulatory Visit (INDEPENDENT_AMBULATORY_CARE_PROVIDER_SITE_OTHER): Payer: Medicare Other | Admitting: Podiatry

## 2018-02-20 DIAGNOSIS — R52 Pain, unspecified: Secondary | ICD-10-CM

## 2018-02-20 DIAGNOSIS — M779 Enthesopathy, unspecified: Secondary | ICD-10-CM

## 2018-02-20 MED ORDER — METHYLPREDNISOLONE 4 MG PO TBPK: ORAL_TABLET | ORAL | 0 refills | Status: DC

## 2018-02-24 NOTE — Progress Notes (Signed)
Subjective: Wayne [name] presents the office today for concerns of pain to the outer aspect of the right ankle as well as along the heel on the back side of the heel.  He denies any recent injury or trauma.  Is been ongoing for about 1 month.  He denies any recent injury or trauma denies any swelling or redness.  He said no recent treatment for this.  He states the pain gets worse after walking.  No change in activity level.  No numbness or tingling. Denies any systemic complaints such as fevers, chills, nausea, vomiting. No acute changes since last appointment, and no other complaints at this time.   Objective: AAO x3, NAD DP/PT pulses palpable bilaterally, CRT less than 3 seconds Significant decrease in medial arch height upon weightbearing there is tenderness to the lateral aspect the foot on the sinus tarsi.  Minimal movement along the subtalar joint.  Ankle joint range of motion restricted as well.  There is no specific area pinpoint bony tenderness or pain to vibratory sensation.  Mild discomfort along the insertion of the Achilles tendon and the posterior heel but Thompson test is negative and tendon appears to be intact.  There is no overlying edema, erythema, increase in warmth. No open lesions or pre-ulcerative lesions.  No pain with calf compression, swelling, warmth, erythema  Assessment: Right foot pain, capsulitis/tendinitis  Plan: -All treatment options discussed with the patient including all alternatives, risks, complications.  -X-rays were obtained and reviewed.  There is arthritic changes present of the foot as well as the ankle joint.  No evidence of acute fracture was identified today. -Steroid injection performed to the sinus tarsi in the right foot.  Foot was prepped with Betadine and then a mixture of 1 cc of Kenalog 10, 0.5 cc of 0.5% Marcaine plain 0.5 cc of 1% lidocaine plain was infiltrated into the lateral aspect along the sinus tarsi without complications.  Postinjection  care was discussed.  He tolerated well. -Tri-Lock ankle brace -Discussed supportive shoes -Stretching and icing -Patient encouraged to call the office with any questions, concerns, change in symptoms.   Trula Slade DPM

## 2018-03-13 ENCOUNTER — Encounter: Payer: Self-pay | Admitting: Podiatry

## 2018-03-13 ENCOUNTER — Ambulatory Visit (INDEPENDENT_AMBULATORY_CARE_PROVIDER_SITE_OTHER): Payer: Medicare Other | Admitting: Podiatry

## 2018-03-13 DIAGNOSIS — M19079 Primary osteoarthritis, unspecified ankle and foot: Secondary | ICD-10-CM

## 2018-03-13 DIAGNOSIS — M2141 Flat foot [pes planus] (acquired), right foot: Secondary | ICD-10-CM | POA: Diagnosis not present

## 2018-03-13 DIAGNOSIS — M2142 Flat foot [pes planus] (acquired), left foot: Secondary | ICD-10-CM

## 2018-03-13 DIAGNOSIS — M7661 Achilles tendinitis, right leg: Secondary | ICD-10-CM | POA: Diagnosis not present

## 2018-03-16 NOTE — Progress Notes (Signed)
Subjective: 84 year old male presents the office for follow-up evaluation of right foot tendinitis, arthritis.  He states that the injection did not help and he continues to have pain.  At this point this is been ongoing now intermittently for several years.  Tried over-the-counter regular bracing as well as injections and shoe changes without any significant improvement. Denies any systemic complaints such as fevers, chills, nausea, vomiting. No acute changes since last appointment, and no other complaints at this time.   Objective: AAO x3, NAD DP/PT pulses palpable bilaterally, CRT less than 3 seconds There is a significant decrease in medial arch height upon weightbearing.  There is minimal range of motion of the subtalar joint and also the ankle joint range of motion is restricted there is crepitation with ankle joint range of motion.  There is tenderness mostly to the lateral aspect the foot on the sinus tarsi.  There is no significant edema there is no erythema or increase in warmth.  There is no area pinpoint bony tenderness.  No open lesions or pre-ulcerative lesions.  No pain with calf compression, swelling, warmth, erythema  Assessment: Significant flatfoot deformity with severe arthritis  Plan: -All treatment options discussed with the patient including all alternatives, risks, complications.  -At this point given that he has had ongoing symptoms I think a more custom brace would be beneficial for him.  The [place], AFO brace would be helpful.  I would have him follow-up with either Liliane Channel or Benjie Karvonen in order to get molded for this and will start with a precertification. -Patient encouraged to call the office with any questions, concerns, change in symptoms.   Trula Slade DPM

## 2018-03-31 ENCOUNTER — Ambulatory Visit: Payer: Medicare Other | Admitting: Orthotics

## 2018-03-31 DIAGNOSIS — M2142 Flat foot [pes planus] (acquired), left foot: Principal | ICD-10-CM

## 2018-03-31 DIAGNOSIS — M2141 Flat foot [pes planus] (acquired), right foot: Secondary | ICD-10-CM

## 2018-03-31 DIAGNOSIS — M19079 Primary osteoarthritis, unspecified ankle and foot: Secondary | ICD-10-CM

## 2018-03-31 DIAGNOSIS — M7661 Achilles tendinitis, right leg: Secondary | ICD-10-CM

## 2018-03-31 NOTE — Progress Notes (Signed)
Saw patietn today per Dr. Jacqualyn Posey for evaluation/casting standard AFO brace.   Patient has fixed pes planus valgus deformity.  Patient has limited ROM ankle sagittal and front planes.  [place] brace w/ little to no correction to accommodate the deformity.  Standard with velcro upper/black. Size 12

## 2018-04-09 DIAGNOSIS — R143 Flatulence: Secondary | ICD-10-CM | POA: Diagnosis not present

## 2018-04-21 ENCOUNTER — Ambulatory Visit (INDEPENDENT_AMBULATORY_CARE_PROVIDER_SITE_OTHER): Payer: Medicare Other | Admitting: Orthotics

## 2018-04-21 DIAGNOSIS — M779 Enthesopathy, unspecified: Secondary | ICD-10-CM

## 2018-04-21 DIAGNOSIS — M19079 Primary osteoarthritis, unspecified ankle and foot: Secondary | ICD-10-CM

## 2018-04-21 DIAGNOSIS — M7661 Achilles tendinitis, right leg: Secondary | ICD-10-CM | POA: Diagnosis not present

## 2018-04-21 DIAGNOSIS — M2141 Flat foot [pes planus] (acquired), right foot: Secondary | ICD-10-CM

## 2018-04-21 DIAGNOSIS — M2142 Flat foot [pes planus] (acquired), left foot: Secondary | ICD-10-CM

## 2018-04-21 NOTE — Progress Notes (Signed)
Patient came in today to pick up standard Afo brace.  Patient was evaluated for fit and function.   The brace fit very well and there were any complaints of the way it felt once donned.  The brace offered ankle stability in both saggital and coroneal planes.  Patient advised to always wear proper fitting shoes with brace. 

## 2018-04-23 DIAGNOSIS — M1711 Unilateral primary osteoarthritis, right knee: Secondary | ICD-10-CM | POA: Diagnosis not present

## 2018-04-30 DIAGNOSIS — M1711 Unilateral primary osteoarthritis, right knee: Secondary | ICD-10-CM | POA: Diagnosis not present

## 2018-05-07 DIAGNOSIS — M1711 Unilateral primary osteoarthritis, right knee: Secondary | ICD-10-CM | POA: Diagnosis not present

## 2018-06-10 ENCOUNTER — Other Ambulatory Visit: Payer: Self-pay | Admitting: Emergency Medicine

## 2018-06-10 ENCOUNTER — Ambulatory Visit (INDEPENDENT_AMBULATORY_CARE_PROVIDER_SITE_OTHER): Payer: Medicare Other | Admitting: Emergency Medicine

## 2018-06-10 ENCOUNTER — Other Ambulatory Visit: Payer: Self-pay

## 2018-06-10 ENCOUNTER — Encounter: Payer: Self-pay | Admitting: Emergency Medicine

## 2018-06-10 VITALS — BP 123/62 | HR 53 | Temp 98.3°F | Resp 16 | Ht 65.5 in | Wt 165.2 lb

## 2018-06-10 DIAGNOSIS — M549 Dorsalgia, unspecified: Secondary | ICD-10-CM | POA: Diagnosis not present

## 2018-06-10 DIAGNOSIS — M47816 Spondylosis without myelopathy or radiculopathy, lumbar region: Secondary | ICD-10-CM | POA: Diagnosis not present

## 2018-06-10 LAB — POCT URINALYSIS DIP (MANUAL ENTRY)
Bilirubin, UA: NEGATIVE
Blood, UA: NEGATIVE
GLUCOSE UA: NEGATIVE mg/dL
LEUKOCYTES UA: NEGATIVE
NITRITE UA: NEGATIVE
Protein Ur, POC: 30 mg/dL — AB
SPEC GRAV UA: 1.02 (ref 1.010–1.025)
UROBILINOGEN UA: 0.2 U/dL
pH, UA: 5.5 (ref 5.0–8.0)

## 2018-06-10 MED ORDER — KETOROLAC TROMETHAMINE 60 MG/2ML IM SOLN
60.0000 mg | Freq: Once | INTRAMUSCULAR | Status: AC
  Administered 2018-06-10: 60 mg via INTRAMUSCULAR

## 2018-06-10 MED ORDER — DICLOFENAC SODIUM 75 MG PO TBEC: 75.0000 mg | DELAYED_RELEASE_TABLET | Freq: Two times a day (BID) | ORAL | 0 refills | Status: AC

## 2018-06-10 NOTE — Progress Notes (Signed)
Wayne [name] 84 y.o.   Chief Complaint  Patient presents with  . Back Pain    lower x 1 month    HISTORY OF PRESENT ILLNESS: This is a 84 y.o. male complaining of persistent sharp low back pain started about a month ago with no injuries.  Has a history of osteoarthritis.  Denies neurological symptoms.  No pain radiation.  No other significant symptoms.  Back Pain  This is a chronic problem. The current episode started more than 1 month ago. The problem occurs constantly. The problem has been waxing and waning since onset. The pain is present in the lumbar spine. The quality of the pain is described as aching. The pain does not radiate. The pain is at a severity of 7/10. The pain is moderate. The symptoms are aggravated by bending, position and twisting. Stiffness is present all day. Pertinent negatives include no abdominal pain, bladder incontinence, bowel incontinence, chest pain, dysuria, fever, headaches, leg pain, numbness, paresis, paresthesias, pelvic pain, perianal numbness, tingling, weakness or weight loss. Risk factors: History of osteoarthritis. He has tried nothing for the symptoms.     Prior to Admission medications   Medication Sig Start Date End Date Taking? Authorizing Provider  amLODipine (NORVASC) 5 MG tablet Take 5 mg by mouth daily.    Yes [provider]  aspirin EC 81 MG tablet Take 81 mg by mouth daily.   Yes [provider]  HYDROcodone-acetaminophen (NORCO/VICODIN) 5-325 MG tablet Take 1 tablet by mouth every 4 (four) hours as needed. 09/28/16  Yes Trula Slade, DPM  Multiple Vitamin (MULTIVITAMIN WITH MINERALS) TABS tablet Take 1 tablet by mouth daily.   Yes [provider]  Omega-3 Fatty Acids (FISH OIL) 1200 MG CAPS Take 1 capsule by mouth 2 (two) times daily.   Yes [provider]  timolol (BETIMOL) 0.5 % ophthalmic solution Place 1 drop into both eyes daily.    Yes [provider]    valsartan-hydrochlorothiazide (DIOVAN-HCT) 80-12.5 MG tablet Take 1 tablet by mouth daily. 07/07/17  Yes [provider]  Vitamin D, Ergocalciferol, (DRISDOL) 50000 UNITS CAPS capsule Take 50,000 Units by mouth every 7 (seven) days. Takes on Monday   Yes [provider]  Influenza vac split quadrivalent PF (FLUZONE HIGH-DOSE) 0.5 ML injection Fluzone High-Dose 2018-2019 (PF) 180 mcg/0.5 mL intramuscular syringe  inject 0.5 milliliter intramuscularly    [provider]  methylPREDNISolone (MEDROL DOSEPAK) 4 MG TBPK tablet Take as directed Patient not taking: Reported on 06/10/2018 02/20/18   Trula Slade, DPM  omeprazole (PRILOSEC) 40 MG capsule Take 40 mg by mouth daily. 07/07/17   [provider]  oxyCODONE-acetaminophen (PERCOCET/ROXICET) 5-325 MG tablet Take 1 tablet by mouth daily as needed for pain. 07/18/17   [provider]  pravastatin (PRAVACHOL) 40 MG tablet pravastatin 40 mg tablet    [provider]  tamsulosin (FLOMAX) 0.4 MG CAPS capsule Take 0.4 mg by mouth daily.  05/03/15   [provider]  timolol (TIMOPTIC) 0.5 % ophthalmic solution INSTILL 1 DROP INTO BOTH EYES ONCE DAILY 03/04/18   [provider]    Allergies  Allergen Reactions  . Cephalexin     Severe mouth and throat dryness.    Patient Active Problem List   Diagnosis Date Noted  . Hammertoe 07/01/2016  . Anemia of chronic renal failure, stage 3 (moderate) ([place]) 11/23/2015  . Dehydration 11/22/2015  . CKD (chronic kidney disease) stage 3, GFR 30-59 ml/min ([facility]) 11/22/2015  .  Sepsis ([place]) 11/22/2015  . Diarrhea 11/22/2015  . Leukopenia 11/22/2015  . Benign essential HTN 11/22/2015  . Protein-calorie malnutrition, severe ([place]) 04/01/2014  . Coronary artery disease involving native coronary artery without angina pectoris 03/18/2012  . Hyperlipidemia 03/18/2012    No past medical history on file.  Past Surgical History:  Procedure Laterality  Date  . APPENDECTOMY    . BACK SURGERY    . ballon angioplasty  03/18/2012  . FOOT SURGERY    . kidney stent Left 2015  . KNEE SURGERY Left x 2   . left ankle    . LEFT HEART CATHETERIZATION WITH CORONARY ANGIOGRAM N/A 03/18/2012   Procedure: LEFT HEART CATHETERIZATION WITH CORONARY ANGIOGRAM;  Surgeon: Laverda Page, MD;  Location: [facility] C[facility] LAB;  Service: Cardiovascular;  Laterality: N/A;  . RENAL ANGIOGRAM N/A 03/18/2012   Procedure: RENAL ANGIOGRAM;  Surgeon: Laverda Page, MD;  Location: [facility] CATH LAB;  Service: Cardiovascular;  Laterality: N/A;  . right wrist      Social History   Socioeconomic History  . Marital status: Widowed    Spouse name: Not on file  . Number of children: 2  . Years of education: 12.5  . Highest education level: Not on file  Occupational History  . Occupation: retired    Comment: [employer]  Social Needs  . Financial resource strain: Not on file  . Food insecurity:    Worry: Not on file    Inability: Not on file  . Transportation needs:    Medical: Not on file    Non-medical: Not on file  Tobacco Use  . Smoking status: Former Smoker    Last attempt to quit: 06/02/1972    Years since quitting: 46.0  . Smokeless tobacco: Never Used  Substance and Sexual Activity  . Alcohol use: Yes    Comment: rare  . Drug use: No  . Sexual activity: Not Currently  Lifestyle  . Physical activity:    Days per week: Not on file    Minutes per session: Not on file  . Stress: Not on file  Relationships  . Social connections:    Talks on phone: Not on file    Gets together: Not on file    Attends religious service: Not on file    Active member of club or organization: Not on file    Attends meetings of clubs or organizations: Not on file    Relationship status: Not on file  . Intimate partner violence:    Fear of current or ex partner: Not on file    Emotionally abused: Not on file    Physically abused: Not on file    Forced sexual activity: Not on  file  Other Topics Concern  . Not on file  Social History Narrative   Lives at home by himself   Caffeine use- none    Family History  Problem Relation Age of Onset  . Dementia Mother   . Cancer Father      Review of Systems  Constitutional: Negative for fever, malaise/fatigue and weight loss.  HENT: Negative.   Eyes: Negative.   Respiratory: Negative.  Negative for cough and shortness of breath.   Cardiovascular: Negative for chest pain, palpitations and leg swelling.  Gastrointestinal: Negative for abdominal pain, blood in stool, bowel incontinence, diarrhea, nausea and vomiting.  Genitourinary: Negative for bladder incontinence, dysuria, hematuria and pelvic pain.  Musculoskeletal: Positive for back pain.  Skin: Negative.  Negative for rash.  Neurological: Negative  for dizziness, tingling, sensory change, focal weakness, weakness, numbness, headaches and paresthesias.  Endo/Heme/Allergies: Negative.   All other systems reviewed and are negative.   Vitals:   06/10/18 1351  BP: 123/62  Pulse: (!) 53  Resp: 16  Temp: 98.3 F (36.8 C)  SpO2: 98%    Physical Exam  Constitutional: He is oriented to person, place, and time. He appears well-developed and well-nourished.  HENT:  Head: Normocephalic.  Eyes: Pupils are equal, round, and reactive to light. EOM are normal.  Neck: Normal range of motion.  Cardiovascular: Normal rate and regular rhythm.  Pulmonary/Chest: Effort normal and breath sounds normal.  Abdominal: Soft. He exhibits no distension. There is no tenderness.  Musculoskeletal:       Lumbar back: He exhibits decreased range of motion and tenderness. He exhibits no bony tenderness and normal pulse.       Back:  Neurological: He is alert and oriented to person, place, and time. No sensory deficit. He exhibits normal muscle tone.  Skin: Skin is warm and dry. Capillary refill takes less than 2 seconds.  Psychiatric: He has a normal mood and affect. His  behavior is normal.  Vitals reviewed.  Results for orders placed or performed in visit on 06/10/18 (from the past 24 hour(s))  POCT urinalysis dipstick     Status: Abnormal   Collection Time: 06/10/18  2:04 PM  Result Value Ref Range   Color, UA yellow yellow   Clarity, UA clear clear   Glucose, UA negative negative mg/dL   Bilirubin, UA negative negative   Ketones, POC UA trace (5) (A) negative mg/dL   Spec Grav, UA 1.020 1.010 - 1.025   Blood, UA negative negative   pH, UA 5.5 5.0 - 8.0   Protein Ur, POC =30 (A) negative mg/dL   Urobilinogen, UA 0.2 0.2 or 1.0 E.U./dL   Nitrite, UA Negative Negative   Leukocytes, UA Negative Negative     ASSESSMENT & PLAN: Wayne [name] was seen today for back pain.  Diagnoses and all orders for this visit:  Acute back pain, unspecified back location, unspecified back pain laterality -     POCT urinalysis dipstick -     ketorolac (TORADOL) injection 60 mg -     diclofenac (VOLTAREN) 75 MG EC tablet; Take 1 tablet (75 mg total) by mouth 2 (two) times daily for 5 days. After 5 days take as needed.  Spondylosis of lumbar region without myelopathy or radiculopathy    Patient Instructions       IF you received an x-ray today, you will receive an invoice from [facility]. Please contact [facility] at [phone] with questions or concerns regarding your invoice.   IF you received labwork today, you will receive an invoice from [facility]. Please contact [facility] at [phone] with questions or concerns regarding your invoice.   Our billing staff will not be able to assist you with questions regarding bills from these companies.  You will be contacted with the lab results as soon as they are available. The fastest way to get your results is to activate your My Chart account. Instructions are located on the last page of this paperwork. If you have not heard from [place] regarding the results in 2 weeks, please contact this  office.     Back Pain, Adult Back pain is very common. The pain often gets better over time. The cause of back pain is usually not dangerous. Most people can learn to manage their back pain  on their own. Follow these instructions at home: Watch your back pain for any changes. The following actions may help to lessen any pain you are feeling:  Stay active. Start with short walks on flat ground if you can. Try to walk farther each day.  Exercise regularly as told by your doctor. Exercise helps your back heal faster. It also helps avoid future injury by keeping your muscles strong and flexible.  Do not sit, drive, or stand in one place for more than 30 minutes.  Do not stay in bed. Resting more than 1-2 days can slow down your recovery.  Be careful when you bend or lift an object. Use good form when lifting: ? Bend at your knees. ? Keep the object close to your body. ? Do not twist.  Sleep on a firm mattress. Lie on your side, and bend your knees. If you lie on your back, put a pillow under your knees.  Take medicines only as told by your doctor.  Put ice on the injured area. ? Put ice in a plastic bag. ? Place a towel between your skin and the bag. ? Leave the ice on for 20 minutes, 2-3 times a day for the first 2-3 days. After that, you can switch between ice and heat packs.  Avoid feeling anxious or stressed. Find good ways to deal with stress, such as exercise.  Maintain a healthy weight. Extra weight puts stress on your back.  Contact a doctor if:  You have pain that does not go away with rest or medicine.  You have worsening pain that goes down into your legs or buttocks.  You have pain that does not get better in one week.  You have pain at night.  You lose weight.  You have a fever or chills. Get help right away if:  You cannot control when you poop (bowel movement) or pee (urinate).  Your arms or legs feel weak.  Your arms or legs lose feeling  (numbness).  You feel sick to your stomach (nauseous) or throw up (vomit).  You have belly (abdominal) pain.  You feel like you may pass out (faint). This information is not intended to replace advice given to you by your health care provider. Make sure you discuss any questions you have with your health care provider. Document Released: 05/07/2008 Document Revised: 04/26/2016 Document Reviewed: 03/23/2014 Elsevier Interactive Patient Education  2018 Elsevier Inc.      Agustina Caroli, MD [facility]

## 2018-06-10 NOTE — Patient Instructions (Addendum)
     IF you received an x-ray today, you will receive an invoice from [facility]. Please contact [facility] at [phone] with questions or concerns regarding your invoice.   IF you received labwork today, you will receive an invoice from [facility]. Please contact [facility] at [phone] with questions or concerns regarding your invoice.   Our billing staff will not be able to assist you with questions regarding bills from these companies.  You will be contacted with the lab results as soon as they are available. The fastest way to get your results is to activate your My Chart account. Instructions are located on the last page of this paperwork. If you have not heard from us regarding the results in 2 weeks, please contact this office.      Back Pain, Adult Back pain is very common. The pain often gets better over time. The cause of back pain is usually not dangerous. Most people can learn to manage their back pain on their own. Follow these instructions at home: Watch your back pain for any changes. The following actions may help to lessen any pain you are feeling:  Stay active. Start with short walks on flat ground if you can. Try to walk farther each day.  Exercise regularly as told by your doctor. Exercise helps your back heal faster. It also helps avoid future injury by keeping your muscles strong and flexible.  Do not sit, drive, or stand in one place for more than 30 minutes.  Do not stay in bed. Resting more than 1-2 days can slow down your recovery.  Be careful when you bend or lift an object. Use good form when lifting: ? Bend at your knees. ? Keep the object close to your body. ? Do not twist.  Sleep on a firm mattress. Lie on your side, and bend your knees. If you lie on your back, put a pillow under your knees.  Take medicines only as told by your doctor.  Put ice on the injured area. ? Put ice in a plastic bag. ? Place a towel between your  skin and the bag. ? Leave the ice on for 20 minutes, 2-3 times a day for the first 2-3 days. After that, you can switch between ice and heat packs.  Avoid feeling anxious or stressed. Find good ways to deal with stress, such as exercise.  Maintain a healthy weight. Extra weight puts stress on your back.  Contact a doctor if:  You have pain that does not go away with rest or medicine.  You have worsening pain that goes down into your legs or buttocks.  You have pain that does not get better in one week.  You have pain at night.  You lose weight.  You have a fever or chills. Get help right away if:  You cannot control when you poop (bowel movement) or pee (urinate).  Your arms or legs feel weak.  Your arms or legs lose feeling (numbness).  You feel sick to your stomach (nauseous) or throw up (vomit).  You have belly (abdominal) pain.  You feel like you may pass out (faint). This information is not intended to replace advice given to you by your health care provider. Make sure you discuss any questions you have with your health care provider. Document Released: 05/07/2008 Document Revised: 04/26/2016 Document Reviewed: 03/23/2014 Elsevier Interactive Patient Education  2018 Elsevier Inc.  

## 2018-06-17 DIAGNOSIS — Z961 Presence of intraocular lens: Secondary | ICD-10-CM | POA: Diagnosis not present

## 2018-06-17 DIAGNOSIS — H401133 Primary open-angle glaucoma, bilateral, severe stage: Secondary | ICD-10-CM | POA: Diagnosis not present

## 2018-06-17 DIAGNOSIS — H33052 Total retinal detachment, left eye: Secondary | ICD-10-CM | POA: Diagnosis not present

## 2018-06-19 DIAGNOSIS — R413 Other amnesia: Secondary | ICD-10-CM | POA: Diagnosis not present

## 2018-06-19 DIAGNOSIS — M1990 Unspecified osteoarthritis, unspecified site: Secondary | ICD-10-CM | POA: Diagnosis not present

## 2018-06-19 DIAGNOSIS — E559 Vitamin D deficiency, unspecified: Secondary | ICD-10-CM | POA: Diagnosis not present

## 2018-06-19 DIAGNOSIS — R5383 Other fatigue: Secondary | ICD-10-CM | POA: Diagnosis not present

## 2018-06-19 DIAGNOSIS — N183 Chronic kidney disease, stage 3 (moderate): Secondary | ICD-10-CM | POA: Diagnosis not present

## 2018-06-19 DIAGNOSIS — I1310 Hypertensive heart and chronic kidney disease without heart failure, with stage 1 through stage 4 chronic kidney disease, or unspecified chronic kidney disease: Secondary | ICD-10-CM | POA: Diagnosis not present

## 2018-06-19 DIAGNOSIS — R7309 Other abnormal glucose: Secondary | ICD-10-CM | POA: Diagnosis not present

## 2018-06-19 DIAGNOSIS — Z79899 Other long term (current) drug therapy: Secondary | ICD-10-CM | POA: Diagnosis not present

## 2018-06-19 DIAGNOSIS — I2510 Atherosclerotic heart disease of native coronary artery without angina pectoris: Secondary | ICD-10-CM | POA: Diagnosis not present

## 2018-07-16 DIAGNOSIS — M545 Low back pain: Secondary | ICD-10-CM | POA: Diagnosis not present

## 2018-07-18 DIAGNOSIS — M47816 Spondylosis without myelopathy or radiculopathy, lumbar region: Secondary | ICD-10-CM | POA: Diagnosis not present

## 2018-07-18 DIAGNOSIS — S335XXD Sprain of ligaments of lumbar spine, subsequent encounter: Secondary | ICD-10-CM | POA: Diagnosis not present

## 2018-07-22 DIAGNOSIS — S335XXD Sprain of ligaments of lumbar spine, subsequent encounter: Secondary | ICD-10-CM | POA: Diagnosis not present

## 2018-07-22 DIAGNOSIS — M47816 Spondylosis without myelopathy or radiculopathy, lumbar region: Secondary | ICD-10-CM | POA: Diagnosis not present

## 2018-07-24 DIAGNOSIS — S335XXD Sprain of ligaments of lumbar spine, subsequent encounter: Secondary | ICD-10-CM | POA: Diagnosis not present

## 2018-07-24 DIAGNOSIS — M47816 Spondylosis without myelopathy or radiculopathy, lumbar region: Secondary | ICD-10-CM | POA: Diagnosis not present

## 2018-07-29 DIAGNOSIS — M47816 Spondylosis without myelopathy or radiculopathy, lumbar region: Secondary | ICD-10-CM | POA: Diagnosis not present

## 2018-07-29 DIAGNOSIS — S335XXD Sprain of ligaments of lumbar spine, subsequent encounter: Secondary | ICD-10-CM | POA: Diagnosis not present

## 2018-07-31 DIAGNOSIS — S335XXD Sprain of ligaments of lumbar spine, subsequent encounter: Secondary | ICD-10-CM | POA: Diagnosis not present

## 2018-07-31 DIAGNOSIS — M47816 Spondylosis without myelopathy or radiculopathy, lumbar region: Secondary | ICD-10-CM | POA: Diagnosis not present

## 2018-08-05 DIAGNOSIS — M47816 Spondylosis without myelopathy or radiculopathy, lumbar region: Secondary | ICD-10-CM | POA: Diagnosis not present

## 2018-08-05 DIAGNOSIS — S335XXD Sprain of ligaments of lumbar spine, subsequent encounter: Secondary | ICD-10-CM | POA: Diagnosis not present

## 2018-08-07 ENCOUNTER — Emergency Department (HOSPITAL_COMMUNITY)
Admission: EM | Admit: 2018-08-07 | Discharge: 2018-08-07 | Disposition: A | Discharge status: Home / Self Care | Payer: Medicare Other | Attending: Emergency Medicine | Admitting: Emergency Medicine

## 2018-08-07 ENCOUNTER — Encounter (HOSPITAL_COMMUNITY): Payer: Self-pay | Admitting: *Deleted

## 2018-08-07 DIAGNOSIS — Z87891 Personal history of nicotine dependence: Secondary | ICD-10-CM | POA: Insufficient documentation

## 2018-08-07 DIAGNOSIS — N183 Chronic kidney disease, stage 3 (moderate): Secondary | ICD-10-CM | POA: Insufficient documentation

## 2018-08-07 DIAGNOSIS — M545 Low back pain, unspecified: Secondary | ICD-10-CM

## 2018-08-07 DIAGNOSIS — I2510 Atherosclerotic heart disease of native coronary artery without angina pectoris: Secondary | ICD-10-CM | POA: Insufficient documentation

## 2018-08-07 DIAGNOSIS — Z79899 Other long term (current) drug therapy: Secondary | ICD-10-CM | POA: Diagnosis not present

## 2018-08-07 DIAGNOSIS — I129 Hypertensive chronic kidney disease with stage 1 through stage 4 chronic kidney disease, or unspecified chronic kidney disease: Secondary | ICD-10-CM | POA: Insufficient documentation

## 2018-08-07 DIAGNOSIS — Z7982 Long term (current) use of aspirin: Secondary | ICD-10-CM | POA: Diagnosis not present

## 2018-08-07 MED ORDER — DEXAMETHASONE 4 MG PO TABS: 4.0000 mg | ORAL_TABLET | Freq: Two times a day (BID) | ORAL | 0 refills | Status: DC

## 2018-08-07 MED ORDER — HYDROMORPHONE HCL 1 MG/ML IJ SOLN
0.7500 mg | Freq: Once | INTRAMUSCULAR | Status: AC
  Administered 2018-08-07: 0.75 mg via INTRAMUSCULAR
  Filled 2018-08-07: qty 1

## 2018-08-07 MED ORDER — MELOXICAM 7.5 MG PO TABS: 7.5000 mg | ORAL_TABLET | Freq: Every day | ORAL | 0 refills | Status: DC

## 2018-08-07 MED ORDER — DEXAMETHASONE 4 MG PO TABS
8.0000 mg | ORAL_TABLET | Freq: Once | ORAL | Status: AC
  Administered 2018-08-07: 8 mg via ORAL
  Filled 2018-08-07: qty 2

## 2018-08-07 MED ORDER — LORAZEPAM 0.5 MG PO TABS
0.5000 mg | ORAL_TABLET | Freq: Once | ORAL | Status: AC
Start: 2018-08-07 — End: 2018-08-07
  Administered 2018-08-07: 0.5 mg via ORAL
  Filled 2018-08-07: qty 1

## 2018-08-07 MED ORDER — KETOROLAC TROMETHAMINE 15 MG/ML IJ SOLN
15.0000 mg | Freq: Once | INTRAMUSCULAR | Status: AC
  Administered 2018-08-07: 15 mg via INTRAMUSCULAR
  Filled 2018-08-07: qty 1

## 2018-08-07 MED ORDER — CYCLOBENZAPRINE HCL 5 MG PO TABS: 10.0000 mg | ORAL_TABLET | Freq: Three times a day (TID) | ORAL | 0 refills | Status: DC | PRN

## 2018-08-07 NOTE — ED Triage Notes (Signed)
Pt complains of lower back and right hip pain for the past few months. Pt has been going to physical therapy twice a week for the pain but states he feels worse.

## 2018-08-13 DIAGNOSIS — M545 Low back pain: Secondary | ICD-10-CM | POA: Diagnosis not present

## 2018-08-16 NOTE — ED Provider Notes (Signed)
[facility] DEPT Provider Note   CSN: [id number] Arrival date & time: 08/07/18  0915     History   Chief Complaint Chief Complaint  Patient presents with  . Back Pain    HPI ARROW TOMKO is a 84 y.o. male.  HPI   84 year old male with right lower back pain.  He has had it for a few months but is sniffily worse here recently.  Recently started in physical therapy for it and feels like his pain has been worse since then.  Followed by rest.  Much worse with movement, standing and ambulating.  Pain radiates into his right buttocks/distal thigh.  No numbness, tingling or focal loss of strength.  Denies any acute trauma.  He has been taking Tylenol with minimal improvement.  Past Medical History:  Diagnosis Date  . Arthritis   . Cataract   . Glaucoma   . Hypertension     Patient Active Problem List   Diagnosis Date Noted  . Acute back pain 06/10/2018  . Spondylosis of lumbar region without myelopathy or radiculopathy 06/10/2018  . Hammertoe 07/01/2016  . Anemia of chronic renal failure, stage 3 (moderate) ([place]) 11/23/2015  . Dehydration 11/22/2015  . CKD (chronic kidney disease) stage 3, GFR 30-59 ml/min ([facility]) 11/22/2015  . Sepsis ([place]) 11/22/2015  . Diarrhea 11/22/2015  . Leukopenia 11/22/2015  . Benign essential HTN 11/22/2015  . Protein-calorie malnutrition, severe ([place]) 04/01/2014  . Coronary artery disease involving native coronary artery without angina pectoris 03/18/2012  . Hyperlipidemia 03/18/2012    Past Surgical History:  Procedure Laterality Date  . APPENDECTOMY    . BACK SURGERY    . ballon angioplasty  03/18/2012  . FOOT SURGERY    . JOINT REPLACEMENT    . kidney stent Left 2015  . KNEE SURGERY Left x 2   . left ankle    . LEFT HEART CATHETERIZATION WITH CORONARY ANGIOGRAM N/A 03/18/2012   Procedure: LEFT HEART CATHETERIZATION WITH CORONARY ANGIOGRAM;  Surgeon: Laverda Page, MD;  Location[facility] CATH LAB;  Service:  Cardiovascular;  Laterality: N/A;  . RENAL ANGIOGRAM N/A 03/18/2012   Procedure: RENAL ANGIOGRAM;  Surgeon: Laverda Page, MD;  Location: [facility] CATH LAB;  Service: Cardiovascular;  Laterality: N/A;  . right wrist    . SPINE SURGERY          Home Medications    Prior to Admission medications   Medication Sig Start Date End Date Taking? Authorizing Provider  amLODipine (NORVASC) 5 MG tablet Take 5 mg by mouth daily.    Yes [provider]  aspirin EC 81 MG tablet Take 81 mg by mouth daily.   Yes [provider]  B Complex-C (B-COMPLEX WITH VITAMIN C) tablet Take 1 tablet by mouth daily.   Yes [provider]  cholecalciferol (VITAMIN D) 1000 units tablet Take 1,000 Units by mouth daily.   Yes [provider]  Multiple Vitamin (MULTIVITAMIN WITH MINERALS) TABS tablet Take 1 tablet by mouth daily.   Yes [provider]  Omega-3 Fatty Acids (FISH OIL) 1200 MG CAPS Take 1 capsule by mouth 2 (two) times daily.   Yes [provider]  oxyCODONE-acetaminophen (PERCOCET/ROXICET) 5-325 MG tablet Take 1 tablet by mouth daily as needed for pain. 07/18/17  Yes [provider]  tamsulosin (FLOMAX) 0.4 MG CAPS capsule Take 0.4 mg by mouth daily.  05/03/15  Yes [provider]  timolol (BETIMOL) 0.5 % ophthalmic solution Place 1 drop into  both eyes daily.    Yes [provider]  valsartan-hydrochlorothiazide (DIOVAN-HCT) 80-12.5 MG tablet Take 1 tablet by mouth daily. 07/07/17  Yes [provider]  cyclobenzaprine (FLEXERIL) 5 MG tablet Take 2 tablets (10 mg total) by mouth 3 (three) times daily as needed for muscle spasms. 08/07/18   Virgel Manifold, MD  dexamethasone (DECADRON) 4 MG tablet Take 1 tablet (4 mg total) by mouth 2 (two) times daily. 08/07/18   Virgel Manifold, MD  HYDROcodone-acetaminophen (NORCO/VICODIN) 5-325 MG tablet Take 1 tablet by mouth every 4 (four) hours as needed. Patient not taking: Reported on  08/07/2018 09/28/16   Trula Slade, DPM  meloxicam (MOBIC) 7.5 MG tablet Take 1 tablet (7.5 mg total) by mouth daily. 08/07/18   Virgel Manifold, MD  methylPREDNISolone (MEDROL DOSEPAK) 4 MG TBPK tablet Take as directed Patient not taking: Reported on 06/10/2018 02/20/18   Trula Slade, DPM    Family History Family History  Problem Relation Age of Onset  . Dementia Mother   . Cancer Father     Social History Social History   Tobacco Use  . Smoking status: Former Smoker    Last attempt to quit: 06/02/1972    Years since quitting: 46.2  . Smokeless tobacco: Never Used  Substance Use Topics  . Alcohol use: Yes    Comment: rare  . Drug use: No     Allergies   Cephalexin   Review of Systems Review of Systems  All systems reviewed and negative, other than as noted in HPI.  Physical Exam Updated Vital Signs BP 129/73   Pulse 66   Temp 98 F (36.7 C) (Oral)   Resp 16   SpO2 97%   Physical Exam  Constitutional: He appears well-developed and well-nourished. No distress.  HENT:  Head: Normocephalic and atraumatic.  Eyes: Conjunctivae are normal. Right eye exhibits no discharge. Left eye exhibits no discharge.  Neck: Neck supple.  Cardiovascular: Normal rate, regular rhythm and normal heart sounds. Exam reveals no gallop and no friction rub.  No murmur heard. Pulmonary/Chest: Effort normal and breath sounds normal. No respiratory distress.  Abdominal: Soft. He exhibits no distension. There is no tenderness.  Musculoskeletal: He exhibits no edema.  Tenderness to palpation in the right lower back extending to the right buttock.  No overlying skin changes.  Strength is normal in bilateral lower extremities.  Increased pain with straight leg test.  Neurological: He is alert.  Skin: Skin is warm and dry.  Psychiatric: He has a normal mood and affect. His behavior is normal. Thought content normal.  Nursing note and vitals reviewed.    ED Treatments / Results   Labs (all labs ordered are listed, but only abnormal results are displayed) Labs Reviewed - No data to display  EKG None  Radiology No results found.   No results found.  Procedures Procedures (including critical care time)  Medications Ordered in ED Medications  HYDROmorphone (DILAUDID) injection 0.75 mg (0.75 mg Intramuscular Given 08/07/18 1048)  ketorolac (TORADOL) 15 MG/ML injection 15 mg (15 mg Intramuscular Given 08/07/18 1048)  dexamethasone (DECADRON) tablet 8 mg (8 mg Oral Given 08/07/18 1048)  LORazepam (ATIVAN) tablet 0.5 mg (0.5 mg Oral Given 08/07/18 1048)     Initial Impression / Assessment and Plan / ED Course  I have reviewed the triage vital signs and the nursing notes.  Pertinent labs & imaging results that were available during my care of the patient were reviewed by me and considered in  my medical decision making (see chart for details).     84 year old male with lower back pain without red flags.  He was treated symptomatically the emergency room with much improvement.  He ambulated through the halls with his walker.  He states that he feels significantly better.  Plan continue symptomatic treatment.  Suspect that he may be overdoing a little bit with this therapy.  Activity as tolerated.  Outpatient follow-up.  Emergent return precautions were discussed.  Final Clinical Impressions(s) / ED Diagnoses   Final diagnoses:  Right-sided low back pain without sciatica, unspecified chronicity    ED Discharge Orders         Ordered    dexamethasone (DECADRON) 4 MG tablet  2 times daily     08/07/18 1233    meloxicam (MOBIC) 7.5 MG tablet  Daily     08/07/18 1233    cyclobenzaprine (FLEXERIL) 5 MG tablet  3 times daily PRN     08/07/18 1233           Virgel Manifold, MD 08/16/18 1237

## 2018-09-05 DIAGNOSIS — Z23 Encounter for immunization: Secondary | ICD-10-CM | POA: Diagnosis not present

## 2018-11-10 DIAGNOSIS — M65351 Trigger finger, right little finger: Secondary | ICD-10-CM | POA: Diagnosis not present

## 2018-11-10 DIAGNOSIS — M65352 Trigger finger, left little finger: Secondary | ICD-10-CM | POA: Diagnosis not present

## 2018-12-04 ENCOUNTER — Ambulatory Visit (INDEPENDENT_AMBULATORY_CARE_PROVIDER_SITE_OTHER): Payer: Medicare Other | Admitting: Nurse Practitioner

## 2018-12-04 ENCOUNTER — Encounter: Payer: Self-pay | Admitting: Nurse Practitioner

## 2018-12-04 VITALS — BP 148/80 | HR 98 | Temp 97.5°F | Ht 64.0 in | Wt 163.6 lb

## 2018-12-04 DIAGNOSIS — I10 Essential (primary) hypertension: Secondary | ICD-10-CM

## 2018-12-04 DIAGNOSIS — H6123 Impacted cerumen, bilateral: Secondary | ICD-10-CM | POA: Diagnosis not present

## 2018-12-04 NOTE — Progress Notes (Signed)
Subjective:     Patient ID: Wayne [name] , male    DOB: Aug 21, 1926 , 85 y.o.   MRN: [id number]   Chief Complaint  Patient presents with  . Cerumen Impaction    patient presents today to have his ears cleaned    HPI  Hearing loss - has a muffled sound since Monday.  Slight nose running.  Denies pain, no fever.      Past Medical History:  Diagnosis Date  . Arthritis   . Cataract   . Glaucoma   . Hypertension      Family History  Problem Relation Age of Onset  . Dementia Mother   . Cancer Father      Current Outpatient Medications:  .  amLODipine (NORVASC) 5 MG tablet, Take 5 mg by mouth daily. , Disp: , Rfl:  .  aspirin EC 81 MG tablet, Take 81 mg by mouth daily., Disp: , Rfl:  .  B Complex-C (B-COMPLEX WITH VITAMIN C) tablet, Take 1 tablet by mouth daily., Disp: , Rfl:  .  cholecalciferol (VITAMIN D) 1000 units tablet, Take 1,000 Units by mouth daily., Disp: , Rfl:  .  cyclobenzaprine (FLEXERIL) 5 MG tablet, Take 2 tablets (10 mg total) by mouth 3 (three) times daily as needed for muscle spasms., Disp: 15 tablet, Rfl: 0 .  HYDROcodone-acetaminophen (NORCO/VICODIN) 5-325 MG tablet, Take 1 tablet by mouth every 4 (four) hours as needed., Disp: 30 tablet, Rfl: 0 .  Multiple Vitamin (MULTIVITAMIN WITH MINERALS) TABS tablet, Take 1 tablet by mouth daily., Disp: , Rfl:  .  Omega-3 Fatty Acids (FISH OIL) 1200 MG CAPS, Take 1 capsule by mouth 2 (two) times daily., Disp: , Rfl:  .  oxyCODONE-acetaminophen (PERCOCET/ROXICET) 5-325 MG tablet, Take 1 tablet by mouth daily as needed for pain., Disp: , Rfl: 0 .  tamsulosin (FLOMAX) 0.4 MG CAPS capsule, Take 0.4 mg by mouth daily. , Disp: , Rfl: 1 .  timolol (BETIMOL) 0.5 % ophthalmic solution, Place 1 drop into both eyes daily. , Disp: , Rfl:  .  valsartan-hydrochlorothiazide (DIOVAN-HCT) 80-12.5 MG tablet, Take 1 tablet by mouth daily., Disp: , Rfl:    Allergies  Allergen Reactions  . Cephalexin     Severe mouth and throat  dryness.     Review of Systems  Constitutional: Negative.  Negative for fatigue.  HENT: Positive for hearing loss (already has hearing loss to left ear ).   Respiratory: Negative.   Cardiovascular: Negative.  Negative for chest pain, palpitations and leg swelling.  Psychiatric/Behavioral: Negative.      Today's Vitals   12/04/18 0940  BP: (!) 148/80  Pulse: 98  Temp: (!) 97.5 F (36.4 C)  TempSrc: Oral  SpO2: 95%  Weight: 163 lb 9.6 oz (74.2 kg)  Height: 5\' 4"  (1.626 m)  PainSc: 0-No pain   Body mass index is 28.08 kg/m.   Objective:  Physical Exam Vitals signs reviewed.  Constitutional:      Appearance: Normal appearance.  HENT:     Right Ear: There is impacted cerumen.     Left Ear: There is impacted cerumen.  Cardiovascular:     Rate and Rhythm: Normal rate.  Skin:    General: Skin is warm and dry.  Neurological:     General: No focal deficit present.     Mental Status: He is alert and oriented to person, place, and time.  Psychiatric:        Mood and Affect: Mood normal.  Assessment And Plan:     1. Hearing loss due to cerumen impaction, bilateral  Removed large amount of cerumen from both ears, mild improvement after removal with lighted curette       Minette Brine, FNP

## 2018-12-10 ENCOUNTER — Ambulatory Visit (INDEPENDENT_AMBULATORY_CARE_PROVIDER_SITE_OTHER): Payer: Medicare Other | Admitting: Internal Medicine

## 2018-12-10 ENCOUNTER — Encounter: Payer: Self-pay | Admitting: Internal Medicine

## 2018-12-10 VITALS — BP 136/80 | HR 65 | Temp 97.2°F | Ht 64.0 in | Wt 165.2 lb

## 2018-12-10 DIAGNOSIS — I1310 Hypertensive heart and chronic kidney disease without heart failure, with stage 1 through stage 4 chronic kidney disease, or unspecified chronic kidney disease: Secondary | ICD-10-CM

## 2018-12-10 DIAGNOSIS — J209 Acute bronchitis, unspecified: Secondary | ICD-10-CM | POA: Diagnosis not present

## 2018-12-10 DIAGNOSIS — I2510 Atherosclerotic heart disease of native coronary artery without angina pectoris: Secondary | ICD-10-CM

## 2018-12-10 MED ORDER — ALBUTEROL SULFATE (2.5 MG/3ML) 0.083% IN NEBU: 2.5000 mg | INHALATION_SOLUTION | Freq: Once | RESPIRATORY_TRACT | Status: DC

## 2018-12-10 NOTE — Patient Instructions (Signed)
Acute Bronchitis, Adult Acute bronchitis is when air tubes (bronchi) in the lungs suddenly get swollen. The condition can make it hard to breathe. It can also cause these symptoms:  A cough.  Coughing up clear, yellow, or green mucus.  Wheezing.  Chest congestion.  Shortness of breath.  A fever.  Body aches.  Chills.  A sore throat. Follow these instructions at home:  Medicines  Take over-the-counter and prescription medicines only as told by your doctor.  If you were prescribed an antibiotic medicine, take it as told by your doctor. Do not stop taking the antibiotic even if you start to feel better. General instructions  Rest.  Drink enough fluids to keep your pee (urine) pale yellow.  Avoid smoking and secondhand smoke. If you smoke and you need help quitting, ask your doctor. Quitting will help your lungs heal faster.  Use an inhaler, cool mist vaporizer, or humidifier as told by your doctor.  Keep all follow-up visits as told by your doctor. This is important. How is this prevented? To lower your risk of getting this condition again:  Wash your hands often with soap and water. If you cannot use soap and water, use hand sanitizer.  Avoid contact with people who have cold symptoms.  Try not to touch your hands to your mouth, nose, or eyes.  Make sure to get the flu shot every year. Contact a doctor if:  Your symptoms do not get better in 2 weeks. Get help right away if:  You cough up blood.  You have chest pain.  You have very bad shortness of breath.  You become dehydrated.  You faint (pass out) or keep feeling like you are going to pass out.  You keep throwing up (vomiting).  You have a very bad headache.  Your fever or chills gets worse. This information is not intended to replace advice given to you by your health care provider. Make sure you discuss any questions you have with your health care provider. Document Released: 05/07/2008 Document  Revised: 07/03/2017 Document Reviewed: 05/09/2016 Elsevier Interactive Patient Education  2019 Elsevier Inc.  

## 2018-12-18 ENCOUNTER — Encounter: Payer: Self-pay | Admitting: Internal Medicine

## 2018-12-18 NOTE — Progress Notes (Signed)
Subjective:     Patient ID: Wayne [name] , male    DOB: 1926-05-12 , 85 y.o.   MRN: [id number]   Chief Complaint  Patient presents with  . Cough    HPI  Cough  This is a recurrent problem. The current episode started 1 to 4 weeks ago. The problem has been gradually worsening. The cough is non-productive. Associated symptoms include chills. Pertinent negatives include no nasal congestion, weight loss or wheezing. The symptoms are aggravated by lying down and cold air. He has tried OTC cough suppressant for the symptoms. The treatment provided mild relief.      Past Medical History:  Diagnosis Date  . Arthritis   . Cataract   . Glaucoma   . Hypertension      Family History  Problem Relation Age of Onset  . Dementia Mother   . Cancer Father      Current Outpatient Medications:  .  amLODipine (NORVASC) 5 MG tablet, Take 5 mg by mouth daily. , Disp: , Rfl:  .  aspirin EC 81 MG tablet, Take 81 mg by mouth daily., Disp: , Rfl:  .  B Complex-C (B-COMPLEX WITH VITAMIN C) tablet, Take 1 tablet by mouth daily., Disp: , Rfl:  .  cholecalciferol (VITAMIN D) 1000 units tablet, Take 1,000 Units by mouth daily., Disp: , Rfl:  .  cyclobenzaprine (FLEXERIL) 5 MG tablet, Take 2 tablets (10 mg total) by mouth 3 (three) times daily as needed for muscle spasms., Disp: 15 tablet, Rfl: 0 .  Multiple Vitamin (MULTIVITAMIN WITH MINERALS) TABS tablet, Take 1 tablet by mouth daily., Disp: , Rfl:  .  Omega-3 Fatty Acids (FISH OIL) 1200 MG CAPS, Take 1 capsule by mouth 2 (two) times daily., Disp: , Rfl:  .  oxyCODONE-acetaminophen (PERCOCET/ROXICET) 5-325 MG tablet, Take 1 tablet by mouth daily as needed for pain., Disp: , Rfl: 0 .  tamsulosin (FLOMAX) 0.4 MG CAPS capsule, Take 0.4 mg by mouth daily. , Disp: , Rfl: 1 .  timolol (BETIMOL) 0.5 % ophthalmic solution, Place 1 drop into both eyes daily. , Disp: , Rfl:  .  valsartan-hydrochlorothiazide (DIOVAN-HCT) 80-12.5 MG tablet, Take 1 tablet by  mouth daily., Disp: , Rfl:   Current Facility-Administered Medications:  .  albuterol (PROVENTIL) (2.5 MG/3ML) 0.083% nebulizer solution 2.5 mg, 2.5 mg, Nebulization, Once, Glendale Chard, MD   Allergies  Allergen Reactions  . Cephalexin     Severe mouth and throat dryness.     Review of Systems  Constitutional: Positive for chills and fatigue. Negative for weight loss.  Respiratory: Positive for cough. Negative for wheezing.   Cardiovascular: Negative.   Genitourinary: Negative.   Neurological: Negative.   Psychiatric/Behavioral: Negative.      Today's Vitals   12/10/18 1033  BP: 136/80  Pulse: 65  Temp: (!) 97.2 F (36.2 C)  TempSrc: Oral  SpO2: 96%  Weight: 165 lb 3.2 oz (74.9 kg)  Height: 5\' 4"  (1.626 m)  PainSc: 8   PainLoc: Foot   Body mass index is 28.36 kg/m.   Objective:  Physical Exam Vitals signs and nursing note reviewed.  Constitutional:      Appearance: Normal appearance. He is ill-appearing.  HENT:     Head: Normocephalic and atraumatic.     Mouth/Throat:     Mouth: Mucous membranes are moist.     Pharynx: Oropharynx is clear. Posterior oropharyngeal erythema present.  Cardiovascular:     Rate and Rhythm: Normal rate and regular rhythm.  Heart sounds: Normal heart sounds.  Pulmonary:     Effort: Pulmonary effort is normal.     Breath sounds: Rhonchi present.  Skin:    General: Skin is warm.  Neurological:     General: No focal deficit present.     Mental Status: He is alert.  Psychiatric:        Mood and Affect: Mood normal.         Assessment And Plan:     1. Acute bronchitis, unspecified organism  He was given albuterol nebulizer treatment with improvement in his breath sounds. He will continue to use Coricidin prn. He may use Delsym 5cc nightly as needed. He will call me in 48-72 hours if he has not had any improvement in his sx.   - albuterol (PROVENTIL) (2.5 MG/3ML) 0.083% nebulizer solution 2.5 mg  2. Hypertensive heart  and renal disease with renal failure, stage 1 through stage 4 or unspecified chronic kidney disease, without heart failure  Controlled.  He will continue with current meds. He is encouraged to use Coricidin products since he has underlying htn.   3. Coronary artery disease involving native coronary artery of native heart without angina pectoris  Chronic, yet stable.   [name], MD

## 2018-12-25 ENCOUNTER — Ambulatory Visit (INDEPENDENT_AMBULATORY_CARE_PROVIDER_SITE_OTHER): Payer: Medicare Other | Admitting: Internal Medicine

## 2018-12-25 ENCOUNTER — Ambulatory Visit (INDEPENDENT_AMBULATORY_CARE_PROVIDER_SITE_OTHER): Payer: Medicare Other

## 2018-12-25 ENCOUNTER — Encounter: Payer: Self-pay | Admitting: Internal Medicine

## 2018-12-25 VITALS — BP 150/86 | HR 80 | Temp 97.8°F | Ht 64.0 in | Wt 164.6 lb

## 2018-12-25 DIAGNOSIS — Z0000 Encounter for general adult medical examination without abnormal findings: Secondary | ICD-10-CM | POA: Diagnosis not present

## 2018-12-25 DIAGNOSIS — R0982 Postnasal drip: Secondary | ICD-10-CM

## 2018-12-25 DIAGNOSIS — Z7982 Long term (current) use of aspirin: Secondary | ICD-10-CM

## 2018-12-25 DIAGNOSIS — N183 Chronic kidney disease, stage 3 unspecified: Secondary | ICD-10-CM

## 2018-12-25 DIAGNOSIS — H33052 Total retinal detachment, left eye: Secondary | ICD-10-CM | POA: Diagnosis not present

## 2018-12-25 DIAGNOSIS — Z961 Presence of intraocular lens: Secondary | ICD-10-CM | POA: Diagnosis not present

## 2018-12-25 DIAGNOSIS — I129 Hypertensive chronic kidney disease with stage 1 through stage 4 chronic kidney disease, or unspecified chronic kidney disease: Secondary | ICD-10-CM | POA: Diagnosis not present

## 2018-12-25 DIAGNOSIS — H401131 Primary open-angle glaucoma, bilateral, mild stage: Secondary | ICD-10-CM | POA: Diagnosis not present

## 2018-12-25 NOTE — Progress Notes (Signed)
Subjective:   Wayne [name] is a 85 y.o. male who presents for Medicare Annual/Subsequent preventive examination.  Review of Systems:  n/a Cardiac Risk Factors include: advanced age (>30men, >54 women);dyslipidemia     Objective:    Vitals: BP (!) 150/86 (BP Location: Left Arm, Patient Position: Sitting)   Pulse 80   Temp 97.8 F (36.6 C) (Oral)   Ht 5\' 4"  (1.626 m)   Wt 164 lb 9.6 oz (74.7 kg)   SpO2 96%   BMI 28.25 kg/m   Body mass index is 28.25 kg/m.  Advanced Directives 12/25/2018 08/07/2018 08/10/2017 11/22/2015 11/21/2015 06/03/2015 03/31/2014  Does Patient Have a Medical Advance Directive? Yes No Yes No No Yes Patient does not have advance directive;Patient would not like information  Type of Scientist, forensic Power of [place];Living will - Living will - - Living will;Healthcare Power of Attorney -  Does patient want to make changes to medical advance directive? No - Patient declined - - - - - -  Copy of [place] in Chart? No - copy requested - - - - No - copy requested -  Would patient like information on creating a medical advance directive? - No - Patient declined - No - patient declined information No - patient declined information - -  Pre-existing out of facility DNR order (yellow form or pink MOST form) - - - - - - No    Tobacco Social History   Tobacco Use  Smoking Status Former Smoker  . Years: 3.00  . Last attempt to quit: 06/02/1972  . Years since quitting: 46.5  Smokeless Tobacco Never Used     Counseling given: Not Answered   Clinical Intake:  Pre-visit preparation completed: Yes  Pain : No/denies pain Pain Score: 0-No pain     Nutritional Status: BMI 25 -29 Overweight Nutritional Risks: None Diabetes: No  How often do you need to have someone help you when you read instructions, pamphlets, or other written materials from your doctor or pharmacy?: 1 - Never What is the last grade level you completed in school?:  12th grade  Interpreter Needed?: No  Information entered by :: NAllen LPN  Past Medical History:  Diagnosis Date  . Arthritis   . Cataract   . Glaucoma   . Hypertension    Past Surgical History:  Procedure Laterality Date  . APPENDECTOMY    . BACK SURGERY    . ballon angioplasty  03/18/2012  . FOOT SURGERY    . JOINT REPLACEMENT    . kidney stent Left 2015  . KNEE SURGERY Left x 2   . left ankle    . LEFT HEART CATHETERIZATION WITH CORONARY ANGIOGRAM N/A 03/18/2012   Procedure: LEFT HEART CATHETERIZATION WITH CORONARY ANGIOGRAM;  Surgeon: Laverda Page, MD;  Location[facility] CATH LAB;  Service: Cardiovascular;  Laterality: N/A;  . RENAL ANGIOGRAM N/A 03/18/2012   Procedure: RENAL ANGIOGRAM;  Surgeon: Laverda Page, MD;  Location: [facility] CATH LAB;  Service: Cardiovascular;  Laterality: N/A;  . right wrist    . SPINE SURGERY     Family History  Problem Relation Age of Onset  . Dementia Mother   . Cancer Father    Social History   Socioeconomic History  . Marital status: Widowed    Spouse name: Not on file  . Number of children: 2  . Years of education: 12.5  . Highest education level: Not on file  Occupational History  . Occupation: retired  Comment: [employer]  Social Needs  . Financial resource strain: Not hard at all  . Food insecurity:    Worry: Never true    Inability: Never true  . Transportation needs:    Medical: No    Non-medical: No  Tobacco Use  . Smoking status: Former Smoker    Years: 3.00    Last attempt to quit: 06/02/1972    Years since quitting: 46.5  . Smokeless tobacco: Never Used  Substance and Sexual Activity  . Alcohol use: Yes    Comment: rare  . Drug use: No  . Sexual activity: Not Currently  Lifestyle  . Physical activity:    Days per week: 3 days    Minutes per session: 90 min  . Stress: Not at all  Relationships  . Social connections:    Talks on phone: Not on file    Gets together: Not on file    Attends religious  service: Not on file    Active member of club or organization: Not on file    Attends meetings of clubs or organizations: Not on file    Relationship status: Not on file  Other Topics Concern  . Not on file  Social History Narrative   Lives at home by himself   Caffeine use- none    Outpatient Encounter Medications as of 12/25/2018  Medication Sig  . amLODipine (NORVASC) 5 MG tablet Take 5 mg by mouth daily.   Marland Kitchen aspirin EC 81 MG tablet Take 81 mg by mouth daily.  . B Complex-C (B-COMPLEX WITH VITAMIN C) tablet Take 1 tablet by mouth daily.  . cholecalciferol (VITAMIN D) 1000 units tablet Take 1,000 Units by mouth daily.  . Multiple Vitamin (MULTIVITAMIN WITH MINERALS) TABS tablet Take 1 tablet by mouth daily.  . Omega-3 Fatty Acids (FISH OIL) 1200 MG CAPS Take 1 capsule by mouth 2 (two) times daily.  Marland Kitchen oxyCODONE-acetaminophen (PERCOCET/ROXICET) 5-325 MG tablet Take 1 tablet by mouth daily as needed for pain.  . tamsulosin (FLOMAX) 0.4 MG CAPS capsule Take 0.4 mg by mouth daily.   . timolol (BETIMOL) 0.5 % ophthalmic solution Place 1 drop into both eyes daily.   . valsartan-hydrochlorothiazide (DIOVAN-HCT) 80-12.5 MG tablet Take 1 tablet by mouth daily.  . cyclobenzaprine (FLEXERIL) 5 MG tablet Take 2 tablets (10 mg total) by mouth 3 (three) times daily as needed for muscle spasms. (Patient not taking: Reported on 12/25/2018)   Facility-Administered Encounter Medications as of 12/25/2018  Medication  . albuterol (PROVENTIL) (2.5 MG/3ML) 0.083% nebulizer solution 2.5 mg    Activities of Daily Living In your present state of health, do you have any difficulty performing the following activities: 12/25/2018  Hearing? Y  Comment deaf in left ear, wear hearing aides  Vision? N  Difficulty concentrating or making decisions? Y  Comment sometimes  Walking or climbing stairs? Y  Comment 2 knee surgeries  Dressing or bathing? N  Doing errands, shopping? N  Preparing Food and eating ? N    Using the Toilet? N  In the past six months, have you accidently leaked urine? N  Do you have problems with loss of bowel control? N  Managing your Medications? N  Managing your Finances? N  Housekeeping or managing your Housekeeping? N  Some recent data might be hidden    Patient Care Team: Glendale Chard, MD as PCP - General (Internal Medicine) Warden Fillers, MD as Consulting Physician (Ophthalmology)   Assessment:   This is a routine wellness examination  for [employer].  Exercise Activities and Dietary recommendations Current Exercise Habits: Structured exercise class(goes to gym), Type of exercise: strength training/weights;stretching, Time (Minutes): > 60, Frequency (Times/Week): 3, Weekly Exercise (Minutes/Week): 0, Intensity: Moderate, Exercise limited by: None identified  Goals    . Patient Stated (pt-stated)     Wants to remain in best health that he can be in       Fall Risk Fall Risk  12/25/2018 12/04/2018 06/10/2018 06/21/2017 06/03/2015  Falls in the past year? 1 1 Yes Yes Yes  Comment - - - Emmi Telephone Survey: data to providers prior to load -  Number falls in past yr: 0 0 1 1 2  or more  Comment lost balance due to equilibrium - - Emmi Telephone Survey Actual Response = 1 -  Injury with Fall? 0 0 No No Yes  Comment - - - - "hand swollen, skinned up"  Risk for fall due to : History of fall(s);Impaired balance/gait;Medication side effect - - - Impaired balance/gait  Risk for fall due to: Comment - - - - arthritis  Follow up Education provided;Falls prevention discussed - - - -   Is the patient's home free of loose throw rugs in walkways, pet beds, electrical cords, etc?   yes      Grab bars in the bathroom? no      Handrails on the stairs?   n/a      Adequate lighting?   yes  Timed Get Up and Go Performed: n/a  Depression Screen PHQ 2/9 Scores 12/25/2018 12/04/2018 06/10/2018  PHQ - 2 Score 0 0 0  PHQ- 9 Score 5 - -    Cognitive Function     6CIT Screen  12/25/2018  What Year? 0 points  What month? 0 points  What time? 0 points  Count back from 20 0 points  Months in reverse 0 points  Repeat phrase 2 points  Total Score 2    Immunization History  Administered Date(s) Administered  . Influenza-Unspecified 08/04/2018  . Pneumococcal-Unspecified 06/18/2011    Qualifies for Shingles Vaccine?  yes  Screening Tests Health Maintenance  Topic Date Due  . HEMOGLOBIN A1C  Jan 05, 1926  . FOOT EXAM  10/12/1938  . OPHTHALMOLOGY EXAM  12/03/2018  . PNA vac Low Risk Adult (2 of 2 - PCV13) 12/26/2019 (Originally 06/17/2012)  . TETANUS/TDAP  08/09/2021  . INFLUENZA VACCINE  Completed   Cancer Screenings: Lung: Low Dose CT Chest recommended if Age 61-80 years, 30 pack-year currently smoking OR have quit w/in 15years. Patient does not qualify. Colorectal: not required  Additional Screenings:  Hepatitis C Screening:      Plan:   States he had Prevnar 13 in 2019. Does not remember exactly when  I have personally reviewed and noted the following in the patient's chart:   . Medical and social history . Use of alcohol, tobacco or illicit drugs  . Current medications and supplements . Functional ability and status . Nutritional status . Physical activity . Advanced directives . List of other physicians . Hospitalizations, surgeries, and ER visits in previous 12 months . Vitals . Screenings to include cognitive, depression, and falls . Referrals and appointments  In addition, I have reviewed and discussed with patient certain preventive protocols, quality metrics, and best practice recommendations. A written personalized care plan for preventive services as well as general preventive health recommendations were provided to patient.     Kellie Simmering, LPN  8/50/[id number]

## 2018-12-25 NOTE — Patient Instructions (Signed)
Mr. Wayne [name] , Thank you for taking time to come for your Medicare Wellness Visit. I appreciate your ongoing commitment to your health goals. Please review the following plan we discussed and let me know if I can assist you in the future.   Screening recommendations/referrals: Colonoscopy: not required Recommended yearly ophthalmology/optometry visit for glaucoma screening and checkup Recommended yearly dental visit for hygiene and checkup  Vaccinations: Influenza vaccine: 08/2018 Pneumococcal vaccine: 06/2010 Tdap vaccine: 08/2011 Shingles vaccine: discussed    Advanced directives: Please bring a copy of your POA (Power of [place]) and/or Living Will to your next appointment.    Conditions/risks identified: Overweight  Next appointment: 12/25/2018  Preventive Care 56 Years and Older, Male Preventive care refers to lifestyle choices and visits with your health care provider that can promote health and wellness. What does preventive care include?  A yearly physical exam. This is also called an annual well check.  Dental exams once or twice a year.  Routine eye exams. Ask your health care provider how often you should have your eyes checked.  Personal lifestyle choices, including:  Daily care of your teeth and gums.  Regular physical activity.  Eating a healthy diet.  Avoiding tobacco and drug use.  Limiting alcohol use.  Practicing safe sex.  Taking low doses of aspirin every day.  Taking vitamin and mineral supplements as recommended by your health care provider. What happens during an annual well check? The services and screenings done by your health care provider during your annual well check will depend on your age, overall health, lifestyle risk factors, and family history of disease. Counseling  Your health care provider may ask you questions about your:  Alcohol use.  Tobacco use.  Drug use.  Emotional well-being.  Home and relationship  well-being.  Sexual activity.  Eating habits.  History of falls.  Memory and ability to understand (cognition).  Work and work Statistician. Screening  You may have the following tests or measurements:  Height, weight, and BMI.  Blood pressure.  Lipid and cholesterol levels. These may be checked every 5 years, or more frequently if you are over 48 years old.  Skin check.  Lung cancer screening. You may have this screening every year starting at age 20 if you have a 30-pack-year history of smoking and currently smoke or have quit within the past 15 years.  Fecal occult blood test (FOBT) of the stool. You may have this test every year starting at age 43.  Flexible sigmoidoscopy or colonoscopy. You may have a sigmoidoscopy every 5 years or a colonoscopy every 10 years starting at age 23.  Prostate cancer screening. Recommendations will vary depending on your family history and other risks.  Hepatitis C blood test.  Hepatitis B blood test.  Sexually transmitted disease (STD) testing.  Diabetes screening. This is done by checking your blood sugar (glucose) after you have not eaten for a while (fasting). You may have this done every 1-3 years.  Abdominal aortic aneurysm (AAA) screening. You may need this if you are a current or former smoker.  Osteoporosis. You may be screened starting at age 46 if you are at high risk. Talk with your health care provider about your test results, treatment options, and if necessary, the need for more tests. Vaccines  Your health care provider may recommend certain vaccines, such as:  Influenza vaccine. This is recommended every year.  Tetanus, diphtheria, and acellular pertussis (Tdap, Td) vaccine. You may need a Td booster every 10  years.  Zoster vaccine. You may need this after age 32.  Pneumococcal 13-valent conjugate (PCV13) vaccine. One dose is recommended after age 27.  Pneumococcal polysaccharide (PPSV23) vaccine. One dose is  recommended after age 67. Talk to your health care provider about which screenings and vaccines you need and how often you need them. This information is not intended to replace advice given to you by your health care provider. Make sure you discuss any questions you have with your health care provider. Document Released: 12/16/2015 Document Revised: 08/08/2016 Document Reviewed: 09/20/2015 Elsevier Interactive Patient Education  2017 [place] Prevention in the Home Falls can cause injuries. They can happen to people of all ages. There are many things you can do to make your home safe and to help prevent falls. What can I do on the outside of my home?  Regularly fix the edges of walkways and driveways and fix any cracks.  Remove anything that might make you trip as you walk through a door, such as a raised step or threshold.  Trim any bushes or trees on the path to your home.  Use bright outdoor lighting.  Clear any walking paths of anything that might make someone trip, such as rocks or tools.  Regularly check to see if handrails are loose or broken. Make sure that both sides of any steps have handrails.  Any raised decks and porches should have guardrails on the edges.  Have any leaves, snow, or ice cleared regularly.  Use sand or salt on walking paths during winter.  Clean up any spills in your garage right away. This includes oil or grease spills. What can I do in the bathroom?  Use night lights.  Install grab bars by the toilet and in the tub and shower. Do not use towel bars as grab bars.  Use non-skid mats or decals in the tub or shower.  If you need to sit down in the shower, use a plastic, non-slip stool.  Keep the floor dry. Clean up any water that spills on the floor as soon as it happens.  Remove soap buildup in the tub or shower regularly.  Attach bath mats securely with double-sided non-slip rug tape.  Do not have throw rugs and other things on  the floor that can make you trip. What can I do in the bedroom?  Use night lights.  Make sure that you have a light by your bed that is easy to reach.  Do not use any sheets or blankets that are too big for your bed. They should not hang down onto the floor.  Have a firm chair that has side arms. You can use this for support while you get dressed.  Do not have throw rugs and other things on the floor that can make you trip. What can I do in the kitchen?  Clean up any spills right away.  Avoid walking on wet floors.  Keep items that you use a lot in easy-to-reach places.  If you need to reach something above you, use a strong step stool that has a grab bar.  Keep electrical cords out of the way.  Do not use floor polish or wax that makes floors slippery. If you must use wax, use non-skid floor wax.  Do not have throw rugs and other things on the floor that can make you trip. What can I do with my stairs?  Do not leave any items on the stairs.  Make sure that  there are handrails on both sides of the stairs and use them. Fix handrails that are broken or loose. Make sure that handrails are as long as the stairways.  Check any carpeting to make sure that it is firmly attached to the stairs. Fix any carpet that is loose or worn.  Avoid having throw rugs at the top or bottom of the stairs. If you do have throw rugs, attach them to the floor with carpet tape.  Make sure that you have a light switch at the top of the stairs and the bottom of the stairs. If you do not have them, ask someone to add them for you. What else can I do to help prevent falls?  Wear shoes that:  Do not have high heels.  Have rubber bottoms.  Are comfortable and fit you well.  Are closed at the toe. Do not wear sandals.  If you use a stepladder:  Make sure that it is fully opened. Do not climb a closed stepladder.  Make sure that both sides of the stepladder are locked into place.  Ask someone to  hold it for you, if possible.  Clearly mark and make sure that you can see:  Any grab bars or handrails.  First and last steps.  Where the edge of each step is.  Use tools that help you move around (mobility aids) if they are needed. These include:  Canes.  Walkers.  Scooters.  Crutches.  Turn on the lights when you go into a dark area. Replace any light bulbs as soon as they burn out.  Set up your furniture so you have a clear path. Avoid moving your furniture around.  If any of your floors are uneven, fix them.  If there are any pets around you, be aware of where they are.  Review your medicines with your doctor. Some medicines can make you feel dizzy. This can increase your chance of falling. Ask your doctor what other things that you can do to help prevent falls. This information is not intended to replace advice given to you by your health care provider. Make sure you discuss any questions you have with your health care provider. Document Released: 09/15/2009 Document Revised: 04/26/2016 Document Reviewed: 12/24/2014 Elsevier Interactive Patient Education  2017 [employer].

## 2018-12-28 NOTE — Progress Notes (Signed)
Subjective:     Patient ID: Wayne [name] , male    DOB: 05-02-1926 , 85 y.o.   MRN: [id number]   Chief Complaint  Patient presents with  . Hypertension    HPI  Hypertension  This is a chronic problem. The current episode started more than 1 year ago. The problem has been gradually improving since onset. The problem is controlled. Pertinent negatives include no blurred vision, chest pain, palpitations or shortness of breath. Past treatments include calcium channel blockers, angiotensin blockers, diuretics and lifestyle changes.     Past Medical History:  Diagnosis Date  . Arthritis   . Cataract   . Glaucoma   . Hypertension      Family History  Problem Relation Age of Onset  . Dementia Mother   . Cancer Father      Current Outpatient Medications:  .  amLODipine (NORVASC) 5 MG tablet, Take 5 mg by mouth daily. , Disp: , Rfl:  .  aspirin EC 81 MG tablet, Take 81 mg by mouth daily., Disp: , Rfl:  .  B Complex-C (B-COMPLEX WITH VITAMIN C) tablet, Take 1 tablet by mouth daily., Disp: , Rfl:  .  cholecalciferol (VITAMIN D) 1000 units tablet, Take 1,000 Units by mouth daily., Disp: , Rfl:  .  cyclobenzaprine (FLEXERIL) 5 MG tablet, Take 2 tablets (10 mg total) by mouth 3 (three) times daily as needed for muscle spasms. (Patient not taking: Reported on 12/25/2018), Disp: 15 tablet, Rfl: 0 .  Multiple Vitamin (MULTIVITAMIN WITH MINERALS) TABS tablet, Take 1 tablet by mouth daily., Disp: , Rfl:  .  Omega-3 Fatty Acids (FISH OIL) 1200 MG CAPS, Take 1 capsule by mouth 2 (two) times daily., Disp: , Rfl:  .  oxyCODONE-acetaminophen (PERCOCET/ROXICET) 5-325 MG tablet, Take 1 tablet by mouth daily as needed for pain., Disp: , Rfl: 0 .  tamsulosin (FLOMAX) 0.4 MG CAPS capsule, Take 0.4 mg by mouth daily. , Disp: , Rfl: 1 .  timolol (BETIMOL) 0.5 % ophthalmic solution, Place 1 drop into both eyes daily. , Disp: , Rfl:  .  valsartan-hydrochlorothiazide (DIOVAN-HCT) 80-12.5 MG tablet, Take 1  tablet by mouth daily., Disp: , Rfl:   Current Facility-Administered Medications:  .  albuterol (PROVENTIL) (2.5 MG/3ML) 0.083% nebulizer solution 2.5 mg, 2.5 mg, Nebulization, Once, [place]endale Chard, MD   Allergies  Allergen Reactions  . Cephalexin     Severe mouth and throat dryness.     Review of Systems  Constitutional: Negative.   Eyes: Negative for blurred vision.  Respiratory: Negative.  Negative for shortness of breath.   Cardiovascular: Negative.  Negative for chest pain and palpitations.  Gastrointestinal: Negative.   Neurological: Negative.   Psychiatric/Behavioral: Negative.      Today's Vitals   12/25/18 1012  BP: (!) 150/86  Pulse: 80  Temp: 97.8 F (36.6 C)  TempSrc: Oral  Weight: 164 lb 9.6 oz (74.7 kg)  Height: 5\' 4"  (1.626 m)  PainSc: 0-No pain   Body mass index is 28.25 kg/m.   Objective:  Physical Exam Vitals signs reviewed.  Constitutional:      Appearance: Normal appearance.  HENT:     Head: Normocephalic and atraumatic.  Cardiovascular:     Rate and Rhythm: Normal rate and regular rhythm.     Heart sounds: Normal heart sounds.  Pulmonary:     Effort: Pulmonary effort is normal.  Skin:    General: Skin is warm.  Neurological:     General: No focal deficit  present.     Mental Status: He is alert.  Psychiatric:        Mood and Affect: Mood normal.         Assessment And Plan:     1. Parenchymal renal hypertension, stage 1 through stage 4 or unspecified chronic kidney disease  Uncontrolled. He does not wish to have any medication adjustment. He is on edge b/c he has scheduled his eye appt too close to this appt. He is worried that he will be late and will not be seen. Pt advised I will contact Dr. Katy Fitch to let him know he is on the way. He will rto in 3 months for re-evaluation.   2. Chronic renal disease, stage III (HCC)  Chronic. He will rto next week for labwork since he has another appt within the hour.   3. Postnasal drip  He  is advised to take loratadine 10mg  once daily. He is advised to take nightly if this causes drowsiness. Also encouraged to stay well hydrated while on this medication. He is encouraged to avoid all OTC decongestants.   4. Abnormal Glucose  HIS A1C HAS BEEN ELEVATED IN THE PAST. I WILL CHECK AN A1C, BMET TODAY. HE IS ENCOURAGED TO AVOID SUGARY BEVERAGES AND PROCESSED FOODS INCLUDNG BREADS, RICE AND PASTA.  [name], MD

## 2018-12-29 ENCOUNTER — Other Ambulatory Visit (INDEPENDENT_AMBULATORY_CARE_PROVIDER_SITE_OTHER): Payer: Medicare Other

## 2018-12-29 DIAGNOSIS — I10 Essential (primary) hypertension: Secondary | ICD-10-CM

## 2018-12-29 DIAGNOSIS — E785 Hyperlipidemia, unspecified: Secondary | ICD-10-CM | POA: Diagnosis not present

## 2018-12-29 DIAGNOSIS — Z0000 Encounter for general adult medical examination without abnormal findings: Secondary | ICD-10-CM

## 2018-12-29 DIAGNOSIS — R7309 Other abnormal glucose: Secondary | ICD-10-CM | POA: Diagnosis not present

## 2018-12-29 LAB — POCT URINALYSIS DIPSTICK
Bilirubin, UA: NEGATIVE
Blood, UA: NEGATIVE
GLUCOSE UA: NEGATIVE
KETONES UA: NEGATIVE
Leukocytes, UA: NEGATIVE
Nitrite, UA: NEGATIVE
Protein, UA: POSITIVE — AB
SPEC GRAV UA: 1.02 (ref 1.010–1.025)
Urobilinogen, UA: 0.2 E.U./dL
pH, UA: 7.5 (ref 5.0–8.0)

## 2018-12-29 NOTE — Addendum Note (Signed)
Addended by: Luana Shu on: 12/29/2018 12:30 PM   Modules accepted: Orders

## 2018-12-29 NOTE — Addendum Note (Signed)
Addended by: Michelle Nasuti on: 12/29/2018 12:24 PM   Modules accepted: Orders

## 2018-12-30 LAB — LIPID PANEL
Chol/HDL Ratio: 3.3 ratio (ref 0.0–5.0)
Cholesterol, Total: 170 mg/dL (ref 100–199)
HDL: 51 mg/dL (ref >39)
LDL Calculated: 101 mg/dL — ABNORMAL HIGH (ref 0–99)
Triglycerides: 88 mg/dL (ref 0–149)
VLDL Cholesterol Cal: 18 mg/dL (ref 5–40)

## 2018-12-30 LAB — CMP14+EGFR
A/G RATIO: 1.5 (ref 1.2–2.2)
ALT: 14 IU/L (ref 0–44)
AST: 18 IU/L (ref 0–40)
Albumin: 4.1 g/dL (ref 3.5–4.6)
Alkaline Phosphatase: 102 IU/L (ref 39–117)
BUN/Creatinine Ratio: 18 (ref 10–24)
BUN: 24 mg/dL (ref 10–36)
Bilirubin Total: 0.4 mg/dL (ref 0.0–1.2)
CALCIUM: 9.7 mg/dL (ref 8.6–10.2)
CO2: 26 mmol/L (ref 20–29)
Chloride: 102 mmol/L (ref 96–106)
Creatinine, Ser: 1.37 mg/dL — ABNORMAL HIGH (ref 0.76–1.27)
GFR calc Af Amer: 52 mL/min/{1.73_m2} — ABNORMAL LOW (ref >59)
GFR, EST NON AFRICAN AMERICAN: 45 mL/min/{1.73_m2} — AB (ref >59)
Globulin, Total: 2.7 g/dL (ref 1.5–4.5)
Glucose: 81 mg/dL (ref 65–99)
POTASSIUM: 4.4 mmol/L (ref 3.5–5.2)
Sodium: 144 mmol/L (ref 134–144)
Total Protein: 6.8 g/dL (ref 6.0–8.5)

## 2018-12-30 LAB — HEMOGLOBIN A1C
ESTIMATED AVERAGE GLUCOSE: 111 mg/dL
HEMOGLOBIN A1C: 5.5 % (ref 4.8–5.6)

## 2018-12-31 ENCOUNTER — Ambulatory Visit: Payer: Self-pay | Admitting: Internal Medicine

## 2018-12-31 DIAGNOSIS — M67912 Unspecified disorder of synovium and tendon, left shoulder: Secondary | ICD-10-CM | POA: Diagnosis not present

## 2019-01-02 ENCOUNTER — Other Ambulatory Visit: Payer: Self-pay | Admitting: Internal Medicine

## 2019-01-15 ENCOUNTER — Other Ambulatory Visit: Payer: Self-pay | Admitting: Internal Medicine

## 2019-01-16 ENCOUNTER — Other Ambulatory Visit: Payer: Self-pay | Admitting: Internal Medicine

## 2019-01-16 MED ORDER — AMLODIPINE BESYLATE 5 MG PO TABS: 5.0000 mg | ORAL_TABLET | Freq: Every day | ORAL | 1 refills | Status: DC

## 2019-01-19 DIAGNOSIS — M1711 Unilateral primary osteoarthritis, right knee: Secondary | ICD-10-CM | POA: Diagnosis not present

## 2019-01-26 DIAGNOSIS — M1711 Unilateral primary osteoarthritis, right knee: Secondary | ICD-10-CM | POA: Diagnosis not present

## 2019-01-30 ENCOUNTER — Other Ambulatory Visit: Payer: Self-pay

## 2019-01-30 MED ORDER — VALSARTAN-HYDROCHLOROTHIAZIDE 80-12.5 MG PO TABS: 1.0000 | ORAL_TABLET | Freq: Every day | ORAL | 2 refills | Status: DC

## 2019-02-04 DIAGNOSIS — M1711 Unilateral primary osteoarthritis, right knee: Secondary | ICD-10-CM | POA: Diagnosis not present

## 2019-02-09 DIAGNOSIS — R351 Nocturia: Secondary | ICD-10-CM | POA: Diagnosis not present

## 2019-02-09 DIAGNOSIS — N401 Enlarged prostate with lower urinary tract symptoms: Secondary | ICD-10-CM | POA: Diagnosis not present

## 2019-03-05 ENCOUNTER — Telehealth: Payer: Self-pay

## 2019-03-05 NOTE — Telephone Encounter (Signed)
The patient called and said that he is having runny nose, eyes, cough, and no smell.  The pt was told that I was advised to tell him to go to urgent care for evaluation.  The office has no openings today.

## 2019-03-06 ENCOUNTER — Telehealth: Payer: Self-pay

## 2019-03-06 ENCOUNTER — Telehealth: Payer: Self-pay | Admitting: Internal Medicine

## 2019-03-06 NOTE — Telephone Encounter (Signed)
PT CONSENT TO TELEPHONE VISIT FOR MONDAY 03/09/19@930AM . PT AWARE TO BE READY FOR CALL 15 MINS EARLY

## 2019-03-06 NOTE — Telephone Encounter (Signed)
The patient wanted an appointment for runny nose, runny eyes, and a cough. The pt was told the office is closed today and that he should go to urgent care.  The pt said he went to urgent care and it was packed.  The pt agreed to a phone visit.

## 2019-03-09 ENCOUNTER — Ambulatory Visit (INDEPENDENT_AMBULATORY_CARE_PROVIDER_SITE_OTHER): Payer: Medicare Other | Admitting: Nurse Practitioner

## 2019-03-09 ENCOUNTER — Encounter: Payer: Self-pay | Admitting: Nurse Practitioner

## 2019-03-09 DIAGNOSIS — J069 Acute upper respiratory infection, unspecified: Secondary | ICD-10-CM | POA: Diagnosis not present

## 2019-03-09 DIAGNOSIS — Z7189 Other specified counseling: Secondary | ICD-10-CM

## 2019-03-09 MED ORDER — AZITHROMYCIN 250 MG PO TABS: ORAL_TABLET | ORAL | 0 refills | Status: AC

## 2019-03-09 MED ORDER — AZITHROMYCIN 250 MG PO TABS: ORAL_TABLET | ORAL | 0 refills | Status: DC

## 2019-03-09 NOTE — Progress Notes (Addendum)
This visit type was conducted due to national recommendations for restrictions regarding the COVID-19 Pandemic (e.g. social distancing).  This format is felt to be most appropriate for this patient at this time.  All issues noted in this document were discussed and addressed.  No physical exam was performed (except for noted visual exam findings with Video Visits).  Please refer to the patient's chart (MyChart message for video visits and phone note for telephone visits) for the patient's consent to telehealth for [place].   Subjective:     Patient ID: Wayne [name] , male    DOB: 1926/04/04 , 85 y.o.   MRN: [id number]  Virtual Visit via Telephone Note  I connected with Wayne [name] at home on 03/09/19 at  9:30 AM EDT by telephone and verified that I am speaking with the correct person using two identifiers.   I discussed the limitations, risks, security and privacy concerns of performing an evaluation and management service by telephone and the availability of in person appointments. I also discussed with the patient that there may be a patient responsible charge related to this service. The patient expressed understanding and agreed to proceed.  Chief Complaint  Patient presents with  . Influenza    cough/runny nose/ no taste or smell    History of Present Illness:   Watery eyes and thick nasal mucous. Unable to taste or smell.  Symptoms occurring since last week in March.  No exposure to anyone with sickness.   Once get up and start moving his chest tightness improves.   Influenza  This is a new problem. The current episode started 1 to 4 weeks ago. The problem has been gradually improving. Associated symptoms include coughing (improving sometimes something will come up ). Pertinent negatives include no chest pain, congestion, fatigue, fever, headaches, myalgias or vomiting. Nothing aggravates the symptoms. The treatment provided no relief.     Past Medical History:  Diagnosis  Date  . Arthritis   . Cataract   . Glaucoma   . Hypertension      Family History  Problem Relation Age of Onset  . Dementia Mother   . Cancer Father      Current Outpatient Medications:  .  amLODipine (NORVASC) 5 MG tablet, Take 1 tablet (5 mg total) by mouth daily., Disp: 90 tablet, Rfl: 1 .  aspirin EC 81 MG tablet, Take 81 mg by mouth daily., Disp: , Rfl:  .  B Complex-C (B-COMPLEX WITH VITAMIN C) tablet, Take 1 tablet by mouth daily., Disp: , Rfl:  .  cholecalciferol (VITAMIN D) 1000 units tablet, Take 1,000 Units by mouth daily., Disp: , Rfl:  .  Multiple Vitamin (MULTIVITAMIN WITH MINERALS) TABS tablet, Take 1 tablet by mouth daily., Disp: , Rfl:  .  Omega-3 Fatty Acids (FISH OIL) 1200 MG CAPS, Take 1 capsule by mouth 2 (two) times daily., Disp: , Rfl:  .  oxyCODONE-acetaminophen (PERCOCET/ROXICET) 5-325 MG tablet, Take 1 tablet by mouth daily as needed for pain., Disp: , Rfl: 0 .  timolol (BETIMOL) 0.5 % ophthalmic solution, Place 1 drop into both eyes daily. , Disp: , Rfl:  .  valsartan-hydrochlorothiazide (DIOVAN-HCT) 80-12.5 MG tablet, Take 1 tablet by mouth daily., Disp: 90 tablet, Rfl: 2 .  cyclobenzaprine (FLEXERIL) 5 MG tablet, Take 2 tablets (10 mg total) by mouth 3 (three) times daily as needed for muscle spasms. (Patient not taking: Reported on 12/25/2018), Disp: 15 tablet, Rfl: 0 .  tamsulosin (FLOMAX) 0.4 MG CAPS capsule,  Take 0.4 mg by mouth daily. , Disp: , Rfl: 1  Current Facility-Administered Medications:  .  albuterol (PROVENTIL) (2.5 MG/3ML) 0.083% nebulizer solution 2.5 mg, 2.5 mg, Nebulization, Once, Glendale Chard, MD   Allergies  Allergen Reactions  . Cephalexin     Severe mouth and throat dryness.     Review of Systems  Constitutional: Negative for fatigue and fever.  HENT: Negative for congestion.   Eyes: Negative for photophobia.  Respiratory: Positive for cough (improving sometimes something will come up ). Negative for wheezing.    Cardiovascular: Negative for chest pain, palpitations and leg swelling.  Gastrointestinal: Negative for vomiting.  Endocrine: Negative for polydipsia, polyphagia and polyuria.  Musculoskeletal: Negative for myalgias.  Neurological: Negative for dizziness and headaches.     There were no vitals filed for this visit.  Observations/Objective: He does not sound like he is in any acute distress.  Unable to visualize patient had phone visit.         Assessment and Plan: 1. Viral upper respiratory tract infection  10 day history of cough, thick nasal secretions, watery eyes  Will treat with zpack due to length of time - azithromycin (ZITHROMAX Z-PAK) 250 MG tablet; Take 2 tablets (500 mg) on  Day 1,  followed by 1 tablet (250 mg) once daily on Days 2 through 5.  Dispense: 6 each; Refill: 0   Follow Up Instructions:   I discussed the assessment and treatment plan with the patient. The patient was provided an opportunity to ask questions and all were answered. The patient agreed with the plan and demonstrated an understanding of the instructions.   The patient was advised to call back or seek an in-person evaluation if the symptoms worsen or if the condition fails to improve as anticipated.  COVID-19 Education: The signs and symptoms of COVID-19 were discussed with the patient and how to seek care for testing (follow up with PCP or arrange E-visit).  The importance of social distancing was discussed today.   Patient Risk:   After full review of this patients clinical status, I feel that they are at least moderate risk at this time.   I provided 11 minutes of non-face-to-face time during this encounter.   Minette Brine, FNP

## 2019-03-31 ENCOUNTER — Encounter: Payer: Self-pay | Admitting: Internal Medicine

## 2019-03-31 ENCOUNTER — Other Ambulatory Visit: Payer: Self-pay

## 2019-03-31 ENCOUNTER — Ambulatory Visit (INDEPENDENT_AMBULATORY_CARE_PROVIDER_SITE_OTHER): Payer: Medicare Other | Admitting: Internal Medicine

## 2019-03-31 VITALS — BP 144/82 | HR 74 | Temp 97.9°F | Ht 64.2 in | Wt 162.4 lb

## 2019-03-31 DIAGNOSIS — R1310 Dysphagia, unspecified: Secondary | ICD-10-CM

## 2019-03-31 DIAGNOSIS — R0982 Postnasal drip: Secondary | ICD-10-CM

## 2019-03-31 DIAGNOSIS — I129 Hypertensive chronic kidney disease with stage 1 through stage 4 chronic kidney disease, or unspecified chronic kidney disease: Secondary | ICD-10-CM | POA: Diagnosis not present

## 2019-03-31 DIAGNOSIS — N183 Chronic kidney disease, stage 3 unspecified: Secondary | ICD-10-CM

## 2019-03-31 MED ORDER — LORATADINE 10 MG PO TABS: 10.0000 mg | ORAL_TABLET | Freq: Every day | ORAL | 2 refills | Status: DC

## 2019-03-31 NOTE — Patient Instructions (Signed)
Dysphagia  Dysphagia is trouble swallowing. This condition occurs when solids and liquids stick in a person's throat on the way down to the stomach, or when food takes longer to get to the stomach. You may have problems swallowing food, liquids, or both. You may also have pain while trying to swallow. It may take you more time and effort to swallow something. What are the causes? This condition is caused by:  Problems with the muscles. They may make it difficult for you to move food and liquids through the tube that connects your mouth to your stomach (esophagus). You may have ulcers, scar tissue, or inflammation that blocks the normal passage of food and liquids. Causes of these problems include: ? Acid reflux from your stomach into your esophagus (gastroesophageal reflux). ? Infections. ? Radiation treatment for cancer. ? Medicines taken without enough fluids to wash them down into your stomach.  Nerve problems. These prevent signals from being sent to the muscles of your esophagus to squeeze (contract) and move what you swallow down to your stomach.  Globus pharyngeus. This is a common problem that involves feeling like something is stuck in the throat or a sense of trouble with swallowing even though nothing is wrong with the swallowing passages.  Stroke. This can affect the nerves and make it difficult to swallow.  Certain conditions, such as cerebral palsy or Parkinson disease. What are the signs or symptoms? Common symptoms of this condition include:  A feeling that solids or liquids are stuck in your throat on the way down to the stomach.  Food taking too long to get to the stomach. Other symptoms include:  Food moving back from your stomach to your mouth (regurgitation).  Noises coming from your throat.  Chest discomfort with swallowing.  A feeling of fullness when swallowing.  Drooling, especially when the throat is blocked.  Pain while swallowing.  Heartburn.   Coughing or gagging while trying to swallow. How is this diagnosed? This condition is diagnosed by:  Barium X-ray. In this test, you swallow a white substance (contrast medium)that sticks to the inside of your esophagus. X-ray images are then taken.  Endoscopy. In this test, a flexible telescope is inserted down your throat to look at your esophagus and your stomach.  CT scans and MRI. How is this treated? Treatment for dysphagia depends on the cause of the condition:  If the dysphagia is caused by acid reflux or infection, medicines may be used. They may include antibiotics and heartburn medicines.  If the dysphagia is caused by problems with your muscles, swallowing therapy may be used to help you strengthen your swallowing muscles. You may have to do specific exercises to strengthen the muscles or stretch them.  If the dysphagia is caused by a blockage or mass, procedures to remove the blockage may be done. You may need surgery and a feeding tube. You may need to make diet changes. Ask your health care provider for specific instructions. Follow these instructions at home: Eating and drinking  Try to eat soft food that is easier to swallow.  Follow any diet changes as told by your health care provider.  Cut your food into small pieces and eat slowly.  Eat and drink only when you are sitting upright.  Do not drink alcohol or caffeine. If you need help quitting, ask your health care provider. General instructions  Check your weight every day to make sure you are not losing weight.  Take over-the-counter and prescription medicines only  as told by your health care provider.  If you were prescribed an antibiotic medicine, take it as told by your health care provider. Do not stop taking the antibiotic even if you start to feel better.  Do not use any products that contain nicotine or tobacco, such as cigarettes and e-cigarettes. If you need help quitting, ask your health care  provider.  Keep all follow-up visits as told by your health care provider. This is important. Contact a health care provider if:  You lose weight because you cannot swallow.  You cough when you drink liquids (aspiration).  You cough up partially digested food. Get help right away if:  You cannot swallow your saliva.  You have shortness of breath or a fever, or both.  You have a hoarse voice and also have trouble swallowing. Summary  Dysphagia is trouble swallowing. This condition occurs when solids and liquids stick in a person's throat on the way down to the stomach, or when food takes longer to get to the stomach.  Dysphagia has many possible causes and symptoms.  Treatment for dysphagia depends on the cause of the condition. This information is not intended to replace advice given to you by your health care provider. Make sure you discuss any questions you have with your health care provider. Document Released: 11/16/2000 Document Revised: 11/08/2016 Document Reviewed: 11/08/2016 Elsevier Interactive Patient Education  2019 Else[employer] Inc[employer]   Dysphagia Eating Plan, Bite Size Food This diet plan is for people with moderate swallowing problems who have transitioned from pureed and minced foods. Bite size foods are soft and cut into small chunks so that they can be swallowed safely. On this eating plan, you may be instructed to drink liquids that are thickened. Work with your health care provider and your diet and nutrition specialist (dietitian) to make sure that you are following the diet safely and getting all the nutrients you need. What are tips for following this plan? General guidelines for foods   You may eat foods that are tender, soft, and moist.  Always test food texture before taking a bite. Poke food with a fork or spoon to make sure it is tender.  Food should be easy to cut and shew. Avoid large pieces of food that require a lot of chewing.  Take small bites.  Each bite should be smaller than your thumb nail (about 49mm by 15 mm).  If you were on pureed and minced food diet plans, you may eat any of the foods included in those diets.  Avoid foods that are very dry, hard, sticky, chewy, coarse, or crunchy.  If instructed by your health care provider, thicken liquids. Follow your health care provider's instructions for what products to use, how to do this, and to what thickness. ? Your health care provider may recommend using a commercial thickener, rice cereal, or potato flakes. Ask your health care provider to recommend thickeners. ? Thickened liquids are usually a "pudding-like" consistency, or they may be as thick as honey or thick enough to eat with a spoon. Cooking  To moisten foods, you may add liquids while you are blending, mashing, or grinding your foods to the right consistency. These liquids include gravies, sauces, vegetable or fruit juice, milk, half and half, or water.  Strain extra liquid from foods before eating.  Reheat foods slowly to prevent a tough crust from forming.  Prepare foods in advance. Meal planning  Eat a variety of foods to get all the nutrients you need.  Some foods may be tolerated better than others. Work with your dietitian to identify which foods are safest for you to eat.  Follow your meal plan as told by your dietitian. What foods are allowed? Grains Moist breads without nuts or seeds. Biscuits, muffins, pancakes, and waffles that are well-moistened with syrup, jelly, margarine, or butter. Cooked cereals. Moist bread stuffing. Moist rice. Well-moistened cold cereal with small chunks. Well-cooked pasta, noodles, rice, and bread dressing in small pieces and thick sauce. Soft dumplings or spaetzle in small pieces and butter or gravy. Vegetables Soft, well-cooked vegetables in small pieces. Soft-cooked, mashed potatoes. Thickened vegetable juice. Fruits Canned or cooked fruits that are soft or moist and do  not have skin or seeds. Fresh, soft bananas. Thickened fruit juices. Meat and other protein foods Tender, moist meats or poultry in small pieces. Moist meatballs or meatloaf. Fish without bones. Eggs or egg substitutes in small pieces. Tofu. Tempeh and meat alternatives in small pieces. Well-cooked, tender beans, peas, baked beans, and other legumes. Dairy Thickened milk. Cream cheese. Yogurt. Cottage cheese. Sour cream. Small pieces of soft cheese. Fats and oils Butter. Oils. Margarine. Mayonnaise. Gravy. Spreads. Sweets and desserts Soft, smooth, moist desserts. Pudding. Custard. Moist cakes. Jam. Jelly. Honey. Preserves. Ask your health care provider whether you can have frozen desserts. Seasoning and other foods All seasonings and sweeteners. All sauces with small chunks. Prepared tuna, egg, or chicken salad without raw fruits or vegetables. Moist casseroles with small, tender pieces of meat. Soups with tender meat. What foods are not allowed? Grains Coarse or dry cereals. Dry breads. Toast. Crackers. Tough, crusty breads, such as [place] bread and baguettes. Dry pancakes, waffles, and muffins. Sticky rice. Dry bread stuffing. Granola. Popcorn. Chips. Vegetables All raw vegetables. Cooked corn. Rubbery or stiff cooked vegetables. Stringy vegetables, such as celery. Tough, crisp fried potatoes. Potato skins. Fruits Hard, crunchy, stringy, high-pulp, and juicy raw fruits such as apples, pineapple, papaya, and watermelon. Small, round fruits, such as grapes. Dried fruit and fruit leather. Meat and other protein foods Large pieces of meat. Dry, tough meats, such as bacon, sausage, and hot dogs. Chicken, [place], or fish with skin and bones. Crunchy peanut butter. Nuts. Seeds. Nut and seed butters. Dairy Yogurt with nuts, seeds, or large chunks. Large chunks of cheese. Frozen desserts and milk consistency not allowed by your dietitian. Sweets and desserts Dry cakes. Chewy or dry cookies. Any  desserts with nuts, seeds, dry fruits, coconut, pineapple, or anything dry, sticky, or hard. Chewy caramel. Licorice. Taffy-type candies. Ask your health care provider whether you can have frozen desserts. Seasoning and other foods Soups with tough or large chunks of meats, poultry, or vegetables. Corn or clam chowder. Smoothies with large chunks of fruit. Summary  Bite size foods can be helpful for people with moderate swallowing problems.  On the dysphagia eating plan, you may eat foods that are soft, moist, and cut into pieces smaller than 21mm by 61mm.  You may be instructed to thicken liquids. Follow your health care provider's instructions about how to do this and to what consistency. This information is not intended to replace advice given to you by your health care provider. Make sure you discuss any questions you have with your health care provider. Document Released: 11/19/2005 Document Revised: 03/01/2017 Document Reviewed: 03/01/2017 Elsevier Interactive Patient Education  2019 [employer].

## 2019-03-31 NOTE — Progress Notes (Signed)
Subjective:     Patient ID: Wayne [name] , male    DOB: 03/18/1926 , 85 y.o.   MRN: [id number]   Chief Complaint  Patient presents with  . Hypertension    HPI  Hypertension  This is a chronic problem. The current episode started more than 1 year ago. The problem has been gradually improving since onset. The problem is uncontrolled. Pertinent negatives include no blurred vision, chest pain, palpitations or shortness of breath. The current treatment provides moderate improvement. Hypertensive end-organ damage includes kidney disease.     Past Medical History:  Diagnosis Date  . Arthritis   . Cataract   . Glaucoma   . Hypertension      Family History  Problem Relation Age of Onset  . Dementia Mother   . Cancer Father      Current Outpatient Medications:  .  amLODipine (NORVASC) 5 MG tablet, Take 1 tablet (5 mg total) by mouth daily., Disp: 90 tablet, Rfl: 1 .  aspirin EC 81 MG tablet, Take 81 mg by mouth daily., Disp: , Rfl:  .  B Complex-C (B-COMPLEX WITH VITAMIN C) tablet, Take 1 tablet by mouth daily., Disp: , Rfl:  .  cholecalciferol (VITAMIN D) 1000 units tablet, Take 1,000 Units by mouth daily., Disp: , Rfl:  .  Multiple Vitamin (MULTIVITAMIN WITH MINERALS) TABS tablet, Take 1 tablet by mouth daily., Disp: , Rfl:  .  Omega-3 Fatty Acids (FISH OIL) 1200 MG CAPS, Take 1 capsule by mouth 2 (two) times daily., Disp: , Rfl:  .  oxyCODONE-acetaminophen (PERCOCET/ROXICET) 5-325 MG tablet, Take 1 tablet by mouth daily as needed for pain., Disp: , Rfl: 0 .  timolol (BETIMOL) 0.5 % ophthalmic solution, Place 1 drop into both eyes daily. , Disp: , Rfl:  .  loratadine (CLARITIN) 10 MG tablet, Take 1 tablet (10 mg total) by mouth daily., Disp: 90 tablet, Rfl: 2 .  valsartan-hydrochlorothiazide (DIOVAN-HCT) 80-12.5 MG tablet, Take 1 tablet by mouth daily., Disp: 90 tablet, Rfl: 2  Current Facility-Administered Medications:  .  albuterol (PROVENTIL) (2.5 MG/3ML) 0.083% nebulizer  solution 2.5 mg, 2.5 mg, Nebulization, Once, Glendale Chard, MD   Allergies  Allergen Reactions  . Cephalexin     Severe mouth and throat dryness.     Review of Systems  Constitutional: Negative.   HENT: Positive for postnasal drip.   Eyes: Negative for blurred vision.  Respiratory: Negative.  Negative for shortness of breath.   Cardiovascular: Negative.  Negative for chest pain and palpitations.  Gastrointestinal: Negative.        He c/o food getting stuck in his throat. Usually happens w/ solids. Has no issues with liquids. No n/v. No change in bowel habits. He does not have pain with chewing his foods. He has noticed that he is coughing up a lot of mucus. No fever/chills.   Neurological: Negative.   Psychiatric/Behavioral: Negative.      Today's Vitals   03/31/19 1004  BP: (!) 144/82  Pulse: 74  Temp: 97.9 F (36.6 C)  TempSrc: Oral  Weight: 162 lb 6.4 oz (73.7 kg)  Height: 5' 4.2" (1.631 m)  PainSc: 0-No pain   Body mass index is 27.7 kg/m.   Objective:  Physical Exam Vitals signs and nursing note reviewed.  Constitutional:      Appearance: Normal appearance.  Cardiovascular:     Rate and Rhythm: Normal rate and regular rhythm.     Heart sounds: Normal heart sounds.  Pulmonary:  Effort: Pulmonary effort is normal.     Breath sounds: Normal breath sounds.  Skin:    General: Skin is warm.  Neurological:     General: No focal deficit present.     Mental Status: He is alert.  Psychiatric:        Mood and Affect: Mood normal.         Assessment And Plan:     1. Parenchymal renal hypertension, stage 1 through stage 4 or unspecified chronic kidney disease  Fair control. He states he is "excited" today. He will continue with current meds. He is encouraged to avoid adding salt to his foods.   2. Chronic renal disease, stage III (HCC)  Chronic, yet stable. He will rto in 3 months for f/u. I will check GFR at that time. He is encouraged to stay well  hydrated.   3. Postnasal drip  He was given rx loratadine to use prn. He is advised that if used daily, he may develop dry mouth. He is again encouraged to stay well hydrated.   4. Dysphagia, unspecified type  I will refer him for a barium swallow/swallowing study. He is in agreement with his treatment plan. He is encouraged to chew his foods slowly and thoroughly prior to trying to swallow. He is also encouraged to pay attention to which foods trigger his symptoms.   - SLP modified barium swallow; Future        [name], MD    THE PATIENT IS ENCOURAGED TO PRACTICE SOCIAL DISTANCING DUE TO THE COVID-19 PANDEMIC.

## 2019-04-02 ENCOUNTER — Ambulatory Visit: Payer: Self-pay

## 2019-04-02 DIAGNOSIS — I1310 Hypertensive heart and chronic kidney disease without heart failure, with stage 1 through stage 4 chronic kidney disease, or unspecified chronic kidney disease: Secondary | ICD-10-CM

## 2019-04-02 DIAGNOSIS — N183 Chronic kidney disease, stage 3 unspecified: Secondary | ICD-10-CM

## 2019-04-02 DIAGNOSIS — R1310 Dysphagia, unspecified: Secondary | ICD-10-CM

## 2019-04-02 DIAGNOSIS — I2510 Atherosclerotic heart disease of native coronary artery without angina pectoris: Secondary | ICD-10-CM

## 2019-04-02 NOTE — Chronic Care Management (AMB) (Signed)
  Chronic Care Management   Telephone Outreach Note  04/02/2019 Name: Wayne [name] MRN: [id number] DOB: [birth year]  Referred by: patient's health plan.  Unsuccessful outreach to Mr. Wayne [name] today by phone in response to a referral sent by Mr. Wayne [name]'s health plan.   A HIPPA compliant phone message was left for the patient providing contact information and requesting a return call.  The CM team will reach out to the patient again over the next 7-10  days.    Glendale Chard, MD has been notified of this outreach and plan.   Daneen Schick, BSW, CDP [facility] Care Management Social Worker [phone]  Total time spent performing care coordination and/or care management activities with the patient by phone or face to face = 5 minutes.

## 2019-04-10 ENCOUNTER — Ambulatory Visit: Payer: Self-pay

## 2019-04-10 NOTE — Chronic Care Management (AMB) (Signed)
  Chronic Care Management   Outreach Note  04/10/2019 Name: Wayne [name] MRN: [id number] DOB: [birth year]  Referred by: Patients Health Plan Reason for referral : Care Coordination   A second unsuccessful telephone outreach was attempted today. The patient was referred to the case management team for assistance with chronic care management and care coordination.   Follow Up Plan: The CM team will reach out to the patient again over the next 7 days.   Daneen Schick, BSW, CDP [facility] Care Management Social Worker [phone]  Total time spent performing care coordination and/or care management activities with the patient by phone or face to face = 3 minutes.

## 2019-04-16 ENCOUNTER — Ambulatory Visit: Payer: Self-pay

## 2019-04-16 DIAGNOSIS — N183 Chronic kidney disease, stage 3 unspecified: Secondary | ICD-10-CM

## 2019-04-16 DIAGNOSIS — I2510 Atherosclerotic heart disease of native coronary artery without angina pectoris: Secondary | ICD-10-CM

## 2019-04-16 DIAGNOSIS — I1310 Hypertensive heart and chronic kidney disease without heart failure, with stage 1 through stage 4 chronic kidney disease, or unspecified chronic kidney disease: Secondary | ICD-10-CM

## 2019-04-16 NOTE — Chronic Care Management (AMB) (Signed)
  Chronic Care Management   Outreach Note  04/16/2019 Name: Wayne [name] MRN: [id number] DOB: 1926/06/03  Referred by: patients health plan Reason for referral : Care Coordination   Third unsuccessful telephone outreach was attempted today. The patient was referred to the case management team for assistance with chronic care management and care coordination. The patient's primary care provider has been notified of our unsuccessful attempts to make or maintain contact with the patient. The care management team is pleased to engage with this patient at any time in the future should he/she be interested in assistance from the care management team.   Follow Up Plan: No further follow up required: Inability to establish contact with the patient  Nadara Eaton, CDP [facility] [place] Management Social Worker [phone]  Total time spent performing care coordination and/or care management activities with the patient by phone or face to face = 3 minutes.

## 2019-04-20 ENCOUNTER — Other Ambulatory Visit: Payer: Self-pay

## 2019-04-20 NOTE — Addendum Note (Signed)
Addended by: Michelle Nasuti on: 04/20/2019 04:46 PM   Modules accepted: Orders

## 2019-04-22 ENCOUNTER — Other Ambulatory Visit (HOSPITAL_COMMUNITY): Payer: Self-pay | Admitting: Internal Medicine

## 2019-04-22 ENCOUNTER — Other Ambulatory Visit: Payer: Self-pay | Admitting: Internal Medicine

## 2019-04-22 DIAGNOSIS — R1310 Dysphagia, unspecified: Secondary | ICD-10-CM

## 2019-04-29 ENCOUNTER — Ambulatory Visit (HOSPITAL_COMMUNITY)
Admission: RE | Admit: 2019-04-29 | Discharge: 2019-04-29 | Disposition: A | Discharge status: Home / Self Care | Payer: Medicare Other | Source: Ambulatory Visit | Attending: Internal Medicine | Admitting: Internal Medicine

## 2019-04-29 ENCOUNTER — Other Ambulatory Visit: Payer: Self-pay

## 2019-04-29 DIAGNOSIS — R1310 Dysphagia, unspecified: Secondary | ICD-10-CM | POA: Diagnosis not present

## 2019-05-11 ENCOUNTER — Ambulatory Visit (INDEPENDENT_AMBULATORY_CARE_PROVIDER_SITE_OTHER): Payer: Medicare Other

## 2019-05-11 ENCOUNTER — Ambulatory Visit (INDEPENDENT_AMBULATORY_CARE_PROVIDER_SITE_OTHER): Payer: Medicare Other | Admitting: Podiatry

## 2019-05-11 ENCOUNTER — Other Ambulatory Visit: Payer: Self-pay

## 2019-05-11 ENCOUNTER — Other Ambulatory Visit: Payer: Self-pay | Admitting: *Deleted

## 2019-05-11 DIAGNOSIS — M19079 Primary osteoarthritis, unspecified ankle and foot: Secondary | ICD-10-CM

## 2019-05-11 DIAGNOSIS — M779 Enthesopathy, unspecified: Secondary | ICD-10-CM

## 2019-05-11 DIAGNOSIS — I2510 Atherosclerotic heart disease of native coronary artery without angina pectoris: Secondary | ICD-10-CM | POA: Diagnosis not present

## 2019-05-11 DIAGNOSIS — M7751 Other enthesopathy of right foot: Secondary | ICD-10-CM

## 2019-05-11 MED ORDER — TRIAMCINOLONE ACETONIDE 10 MG/ML IJ SUSP
10.0000 mg | Freq: Once | INTRAMUSCULAR | Status: AC
  Administered 2019-05-11: 10 mg

## 2019-05-11 NOTE — Progress Notes (Signed)
Subjective: Wayne [name] presents to the office today for concerns of right ankle pain. He states that due to his pronation he puts a lot of stress to the outside of the ankle. Over the last few weeks he he states that he has had difficulty putting weight on his foot.  He does have a his [place] brace I need for him previously but does not wear it all the time. Denies any systemic complaints such as fevers, chills, nausea, vomiting. No acute changes since last appointment, and no other complaints at this time.   Objective: AAO x3, NAD DP/PT pulses palpable bilaterally, CRT less than 3 seconds There is tenderness to palpation over the sinus tarsi on the right mild tenderness on the distal aspect the fibula but also discomfort in the course the peroneal tendons.  There is minimal edema there is no erythema or warmth.  The significant arthritic changes present in the ankle as well as subtalar joint and foot the deformity present. No open lesions or pre-ulcerative lesions.  No pain with calf compression, swelling, warmth, erythema  Assessment: Capsulitis subtalar joint, concern for possible stress fracture fibula, peroneal tendinitis  Plan: -All treatment options discussed with the patient including all alternatives, risks, complications.  -X-rays obtained reviewed.  No definite evidence of acute fracture identified today. -Steroid injection performed to the sinus tarsi.  See procedure note below. -Given his pain level I dispensed a cam boot. -Ice elevation -Next appointment 1 to bring the [place] and to see if we can modify to make more comfortable for him thinking long-term. -Patient encouraged to call the office with any questions, concerns, change in symptoms.   Procedure: Injection intermediate joint Discussed alternatives, risks, complications and verbal consent was obtained.  Location: Right sinus tarsi Skin Prep: Betadine Injectate: 0.5cc 0.5% marcaine plain, 0.5 cc 2% lidocaine plain  and, 1 cc kenalog 10. Disposition: Patient tolerated procedure well. Injection site dressed with a band-aid.  Post-injection care was discussed and return precautions discussed.   Trula Slade DPM

## 2019-05-20 ENCOUNTER — Other Ambulatory Visit (HOSPITAL_COMMUNITY): Payer: Self-pay

## 2019-05-20 DIAGNOSIS — R1310 Dysphagia, unspecified: Secondary | ICD-10-CM

## 2019-05-26 ENCOUNTER — Other Ambulatory Visit: Payer: Self-pay

## 2019-05-26 ENCOUNTER — Ambulatory Visit (INDEPENDENT_AMBULATORY_CARE_PROVIDER_SITE_OTHER): Payer: Medicare Other | Admitting: Podiatry

## 2019-05-26 DIAGNOSIS — K224 Dyskinesia of esophagus: Secondary | ICD-10-CM

## 2019-05-26 DIAGNOSIS — M2141 Flat foot [pes planus] (acquired), right foot: Secondary | ICD-10-CM | POA: Diagnosis not present

## 2019-05-26 DIAGNOSIS — I2510 Atherosclerotic heart disease of native coronary artery without angina pectoris: Secondary | ICD-10-CM

## 2019-05-26 DIAGNOSIS — M2142 Flat foot [pes planus] (acquired), left foot: Secondary | ICD-10-CM | POA: Diagnosis not present

## 2019-05-26 DIAGNOSIS — M84363A Stress fracture, right fibula, initial encounter for fracture: Secondary | ICD-10-CM

## 2019-05-26 DIAGNOSIS — M19079 Primary osteoarthritis, unspecified ankle and foot: Secondary | ICD-10-CM | POA: Diagnosis not present

## 2019-05-26 MED ORDER — TRAMADOL HCL 50 MG PO TABS: 50.0000 mg | ORAL_TABLET | Freq: Two times a day (BID) | ORAL | 0 refills | Status: AC | PRN

## 2019-05-27 NOTE — Progress Notes (Signed)
Subjective: 85 year old male presents the office today for follow-up evaluation of right ankle pain.  He is the cam boot has been somewhat helpful.  He states the pain is more intermittent but still gets pain to the lateral aspect.  No recent falls.  Is been wearing the cam boot.  He also brought his [place] brace at first to evaluate.  He feels that he needs padding any points to the lateral aspect.  The injection did not last long.  Denies any systemic complaints such as fevers, chills, nausea, vomiting. No acute changes since last appointment, and no other complaints at this time.   Objective: AAO x3, NAD DP/PT pulses palpable bilaterally, CRT less than 3 seconds Severe flatfoot deformities present.  Mild still mild tenderness palpation on the course of the sinus tarsi.  There is also tenderness on the distal aspect of the fibula.  Mild edema there is no erythema or warmth.  Foot is rigid. No open lesions or pre-ulcerative lesions.  No pain with calf compression, swelling, warmth, erythema  Assessment: Flatfoot deformity resulting in lateral ankle pain, concern for stress fracture of the fibula  Plan: -All treatment options discussed with the patient including all alternatives, risks, complications.  -His pain is improved in the cam boot for now and continue mobilization in the cam boot.  I also had direct evaluate his [place] brace today in order to add padding to this more comfortable for him.  He was asked for pain medication.  Prescribed tramadol. -Patient encouraged to call the office with any questions, concerns, change in symptoms.     Repeat x-ray next appointment Return in about 3 weeks (around 06/16/2019).  Trula Slade DPM

## 2019-05-28 ENCOUNTER — Ambulatory Visit (HOSPITAL_COMMUNITY)
Admission: RE | Admit: 2019-05-28 | Discharge: 2019-05-28 | Disposition: A | Discharge status: Home / Self Care | Payer: Medicare Other | Source: Ambulatory Visit | Attending: Internal Medicine | Admitting: Internal Medicine

## 2019-05-28 ENCOUNTER — Other Ambulatory Visit: Payer: Self-pay

## 2019-05-28 DIAGNOSIS — R1310 Dysphagia, unspecified: Secondary | ICD-10-CM | POA: Diagnosis not present

## 2019-05-28 DIAGNOSIS — K219 Gastro-esophageal reflux disease without esophagitis: Secondary | ICD-10-CM | POA: Diagnosis not present

## 2019-05-28 DIAGNOSIS — R05 Cough: Secondary | ICD-10-CM | POA: Diagnosis not present

## 2019-06-01 DIAGNOSIS — I10 Essential (primary) hypertension: Secondary | ICD-10-CM | POA: Diagnosis not present

## 2019-06-01 DIAGNOSIS — K219 Gastro-esophageal reflux disease without esophagitis: Secondary | ICD-10-CM | POA: Diagnosis not present

## 2019-06-01 DIAGNOSIS — R1310 Dysphagia, unspecified: Secondary | ICD-10-CM | POA: Diagnosis not present

## 2019-06-16 ENCOUNTER — Encounter: Payer: Self-pay | Admitting: Podiatry

## 2019-06-16 ENCOUNTER — Ambulatory Visit (INDEPENDENT_AMBULATORY_CARE_PROVIDER_SITE_OTHER): Payer: Medicare Other | Admitting: Podiatry

## 2019-06-16 ENCOUNTER — Other Ambulatory Visit: Payer: Self-pay

## 2019-06-16 ENCOUNTER — Ambulatory Visit (INDEPENDENT_AMBULATORY_CARE_PROVIDER_SITE_OTHER): Payer: Medicare Other

## 2019-06-16 VITALS — Temp 97.9°F

## 2019-06-16 DIAGNOSIS — M2141 Flat foot [pes planus] (acquired), right foot: Secondary | ICD-10-CM

## 2019-06-16 DIAGNOSIS — K449 Diaphragmatic hernia without obstruction or gangrene: Secondary | ICD-10-CM | POA: Diagnosis not present

## 2019-06-16 DIAGNOSIS — I2510 Atherosclerotic heart disease of native coronary artery without angina pectoris: Secondary | ICD-10-CM

## 2019-06-16 DIAGNOSIS — M84363D Stress fracture, right fibula, subsequent encounter for fracture with routine healing: Secondary | ICD-10-CM

## 2019-06-16 DIAGNOSIS — M2142 Flat foot [pes planus] (acquired), left foot: Secondary | ICD-10-CM | POA: Diagnosis not present

## 2019-06-16 DIAGNOSIS — R1310 Dysphagia, unspecified: Secondary | ICD-10-CM | POA: Diagnosis not present

## 2019-06-16 DIAGNOSIS — M779 Enthesopathy, unspecified: Secondary | ICD-10-CM

## 2019-06-23 DIAGNOSIS — H33052 Total retinal detachment, left eye: Secondary | ICD-10-CM | POA: Diagnosis not present

## 2019-06-23 DIAGNOSIS — H401131 Primary open-angle glaucoma, bilateral, mild stage: Secondary | ICD-10-CM | POA: Diagnosis not present

## 2019-06-23 DIAGNOSIS — Z961 Presence of intraocular lens: Secondary | ICD-10-CM | POA: Diagnosis not present

## 2019-06-23 NOTE — Progress Notes (Signed)
Subjective: 85 year old male presents the office today follow-up evaluation of right ankle pain.  He states he does feel better.  He is majority pain when he first gets up but after he walks it feels better.  He has been wearing the boot more consistently for the last couple days which is been helpful.  He states he wears the boot all day it does hurt.  He has not tried the [place] that we modified.  No recent injury or falls. Denies any systemic complaints such as fevers, chills, nausea, vomiting. No acute changes since last appointment, and no other complaints at this time.   Objective: AAO x3, NAD DP/PT pulses palpable bilaterally, CRT less than 3 seconds There is still tenderness palpation along the lateral aspect of the right ankle.  Majority of tenderness to the lateral ankle gutter, subtalar joint.  Significant flatfoot deformities present and there is a valgus deformity present of the ankle.  Deep range of motion of the ankle, subtalar joint.  No pain in the medial ankle.  No significant pinpoint tenderness.  Some mild chronic edema to the lateral aspect of the ankle but no increased there is no erythema or warmth. No open lesions or pre-ulcerative lesions.  No pain with calf compression, swelling, warmth, erythema  Assessment: Right chronic ankle pain due to severe flatfoot deformity/valgus deformity  Plan: -All treatment options discussed with the patient including all alternatives, risks, complications.  -X-rays were obtained reviewed.  No definitive evidence of acute fracture identified today.  Significant flatfoot deformities present and valgus deformity.  Significant arthritic changes present. -I want him to try to transition to the [place] brace.  Discussed gradually wearing this to break it down.  We did modify this from last appointment.  If needs any further modifications to let us know.  Majority pain is getting her morning after he starts to walk it feels better.  I would  have symptoms physical therapy as well to see.  I did discuss this with the therapist.  Given the arthritis expect significant improvement however if we can work on general rehab, stretching exercises for him. -Pain medication as needed  Return in about 4 weeks (around 07/14/2019).  Trula Slade DPM

## 2019-06-30 ENCOUNTER — Encounter: Payer: Self-pay | Admitting: Internal Medicine

## 2019-06-30 ENCOUNTER — Other Ambulatory Visit: Payer: Self-pay

## 2019-06-30 ENCOUNTER — Ambulatory Visit (INDEPENDENT_AMBULATORY_CARE_PROVIDER_SITE_OTHER): Payer: Medicare Other | Admitting: Internal Medicine

## 2019-06-30 VITALS — BP 146/82 | HR 91 | Temp 98.4°F | Ht 64.2 in | Wt 160.4 lb

## 2019-06-30 DIAGNOSIS — I1310 Hypertensive heart and chronic kidney disease without heart failure, with stage 1 through stage 4 chronic kidney disease, or unspecified chronic kidney disease: Secondary | ICD-10-CM | POA: Diagnosis not present

## 2019-06-30 DIAGNOSIS — M25571 Pain in right ankle and joints of right foot: Secondary | ICD-10-CM | POA: Diagnosis not present

## 2019-06-30 DIAGNOSIS — E559 Vitamin D deficiency, unspecified: Secondary | ICD-10-CM

## 2019-06-30 DIAGNOSIS — M8430XD Stress fracture, unspecified site, subsequent encounter for fracture with routine healing: Secondary | ICD-10-CM

## 2019-06-30 DIAGNOSIS — M6281 Muscle weakness (generalized): Secondary | ICD-10-CM | POA: Diagnosis not present

## 2019-06-30 DIAGNOSIS — N183 Chronic kidney disease, stage 3 unspecified: Secondary | ICD-10-CM

## 2019-06-30 DIAGNOSIS — M79671 Pain in right foot: Secondary | ICD-10-CM | POA: Diagnosis not present

## 2019-06-30 DIAGNOSIS — R262 Difficulty in walking, not elsewhere classified: Secondary | ICD-10-CM | POA: Diagnosis not present

## 2019-06-30 DIAGNOSIS — I2510 Atherosclerotic heart disease of native coronary artery without angina pectoris: Secondary | ICD-10-CM

## 2019-06-30 DIAGNOSIS — M25471 Effusion, right ankle: Secondary | ICD-10-CM | POA: Diagnosis not present

## 2019-06-30 DIAGNOSIS — Z712 Person consulting for explanation of examination or test findings: Secondary | ICD-10-CM | POA: Diagnosis not present

## 2019-06-30 DIAGNOSIS — Z79899 Other long term (current) drug therapy: Secondary | ICD-10-CM | POA: Diagnosis not present

## 2019-06-30 NOTE — Patient Instructions (Signed)

## 2019-07-01 LAB — BMP8+EGFR
BUN/Creatinine Ratio: 20 (ref 10–24)
BUN: 26 mg/dL (ref 10–36)
CO2: 24 mmol/L (ref 20–29)
Calcium: 9.8 mg/dL (ref 8.6–10.2)
Chloride: 104 mmol/L (ref 96–106)
Creatinine, Ser: 1.28 mg/dL — ABNORMAL HIGH (ref 0.76–1.27)
GFR calc Af Amer: 57 mL/min/{1.73_m2} — ABNORMAL LOW (ref >59)
GFR calc non Af Amer: 49 mL/min/{1.73_m2} — ABNORMAL LOW (ref >59)
Glucose: 138 mg/dL — ABNORMAL HIGH (ref 65–99)
Potassium: 4.2 mmol/L (ref 3.5–5.2)
Sodium: 144 mmol/L (ref 134–144)

## 2019-07-01 LAB — CBC
Hematocrit: 40.2 % (ref 37.5–51.0)
Hemoglobin: 13.3 g/dL (ref 13.0–17.7)
MCH: 32.9 pg (ref 26.6–33.0)
MCHC: 33.1 g/dL (ref 31.5–35.7)
MCV: 100 fL — ABNORMAL HIGH (ref 79–97)
Platelets: 226 10*3/uL (ref 150–450)
RBC: 4.04 x10E6/uL — ABNORMAL LOW (ref 4.14–5.80)
RDW: 12.4 % (ref 11.6–15.4)
WBC: 3.9 10*3/uL (ref 3.4–10.8)

## 2019-07-01 LAB — VITAMIN D 25 HYDROXY (VIT D DEFICIENCY, FRACTURES): Vit D, 25-Hydroxy: 37.5 ng/mL (ref 30.0–100.0)

## 2019-07-01 NOTE — Progress Notes (Signed)
Subjective:     Patient ID: Wayne [name] , male    DOB: [birth year] , 85 y.o.   MRN: [id number]   Chief Complaint  Patient presents with  . Hypertension    HPI  Hypertension This is a chronic problem. The current episode started more than 1 year ago. The problem has been gradually improving since onset. The problem is controlled. Pertinent negatives include no blurred vision, palpitations or shortness of breath. Past treatments include calcium channel blockers, angiotensin blockers, diuretics and lifestyle changes. Hypertensive end-organ damage includes kidney disease.     Past Medical History:  Diagnosis Date  . Arthritis   . Cataract   . Glaucoma   . Hypertension      Family History  Problem Relation Age of Onset  . Dementia Mother   . Cancer Father      Current Outpatient Medications:  .  amLODipine (NORVASC) 5 MG tablet, Take 1 tablet (5 mg total) by mouth daily., Disp: 90 tablet, Rfl: 1 .  aspirin EC 81 MG tablet, Take 81 mg by mouth daily., Disp: , Rfl:  .  B Complex-C (B-COMPLEX WITH VITAMIN C) tablet, Take 1 tablet by mouth daily., Disp: , Rfl:  .  cholecalciferol (VITAMIN D) 1000 units tablet, Take 1,000 Units by mouth daily., Disp: , Rfl:  .  fluocinonide cream (LIDEX) 0.86 %, Apply 1 application topically 2 (two) times daily., Disp: , Rfl:  .  ketoconazole (NIZORAL) 2 % cream, Apply 1 application topically daily., Disp: , Rfl:  .  loratadine (CLARITIN) 10 MG tablet, Take 1 tablet (10 mg total) by mouth daily., Disp: 90 tablet, Rfl: 2 .  Multiple Vitamin (MULTIVITAMIN WITH MINERALS) TABS tablet, Take 1 tablet by mouth daily., Disp: , Rfl:  .  Omega-3 Fatty Acids (FISH OIL) 1200 MG CAPS, Take 1 capsule by mouth 2 (two) times daily., Disp: , Rfl:  .  oxyCODONE-acetaminophen (PERCOCET/ROXICET) 5-325 MG tablet, Take 1 tablet by mouth daily as needed for pain., Disp: , Rfl: 0 .  tamsulosin (FLOMAX) 0.4 MG CAPS capsule, , Disp: , Rfl:  .  timolol (BETIMOL) 0.5 %  ophthalmic solution, Place 1 drop into both eyes daily. , Disp: , Rfl:  .  valsartan-hydrochlorothiazide (DIOVAN-HCT) 80-12.5 MG tablet, Take 1 tablet by mouth daily., Disp: 90 tablet, Rfl: 2   Allergies  Allergen Reactions  . Cephalexin     Severe mouth and throat dryness.     Review of Systems  Constitutional: Negative.   Eyes: Negative for blurred vision.  Respiratory: Negative.  Negative for shortness of breath.   Cardiovascular: Negative.  Negative for palpitations.  Gastrointestinal: Negative.   Neurological: Negative.   Psychiatric/Behavioral: Negative.      Today's Vitals   06/30/19 1006  BP: (!) 146/82  Pulse: 91  Temp: 98.4 F (36.9 C)  TempSrc: Oral  Weight: 160 lb 6.4 oz (72.8 kg)  Height: 5' 4.2" (1.631 m)  PainSc: 0-No pain   Body mass index is 27.36 kg/m.   Objective:  Physical Exam Vitals signs and nursing note reviewed.  Constitutional:      Appearance: Normal appearance.  Cardiovascular:     Rate and Rhythm: Normal rate and regular rhythm.     Heart sounds: Normal heart sounds.  Pulmonary:     Effort: Pulmonary effort is normal.     Breath sounds: Normal breath sounds.  Skin:    General: Skin is warm.  Neurological:     General: No focal deficit present.  Mental Status: He is alert.  Psychiatric:        Mood and Affect: Mood normal.         Assessment And Plan:     1. Hypertensive heart and renal disease with renal failure, stage 1 through stage 4 or unspecified chronic kidney disease, without heart failure  Fair control.  He will continue with current meds for now.   2. Chronic renal disease, stage III (HCC)  Chronic, this has been stable. He is encouraged to stay well hydrated.   - BMP8+EGFR - CBC no Diff  3. Stress fracture with routine healing, unspecified site, subsequent encounter  Podiatry note reviewed during visit. I will check vit d level today.   4. Vitamin D deficiency  I WILL CHECK A VIT D LEVEL AND SUPPLEMENT  AS NEEDED.  ALSO ENCOURAGED TO SPEND 15 MINUTES IN THE SUN DAILY.  - Vitamin D (25 hydroxy)        [name], MD    THE PATIENT IS ENCOURAGED TO PRACTICE SOCIAL DISTANCING DUE TO THE COVID-19 PANDEMIC.

## 2019-07-02 DIAGNOSIS — M25471 Effusion, right ankle: Secondary | ICD-10-CM | POA: Diagnosis not present

## 2019-07-02 DIAGNOSIS — M6281 Muscle weakness (generalized): Secondary | ICD-10-CM | POA: Diagnosis not present

## 2019-07-02 DIAGNOSIS — R262 Difficulty in walking, not elsewhere classified: Secondary | ICD-10-CM | POA: Diagnosis not present

## 2019-07-02 DIAGNOSIS — M79671 Pain in right foot: Secondary | ICD-10-CM | POA: Diagnosis not present

## 2019-07-02 DIAGNOSIS — M25571 Pain in right ankle and joints of right foot: Secondary | ICD-10-CM | POA: Diagnosis not present

## 2019-07-03 ENCOUNTER — Other Ambulatory Visit: Payer: Self-pay

## 2019-07-05 LAB — VITAMIN B12: Vitamin B-12: 1111 pg/mL (ref 232–1245)

## 2019-07-05 LAB — SPECIMEN STATUS REPORT

## 2019-07-06 DIAGNOSIS — M25471 Effusion, right ankle: Secondary | ICD-10-CM | POA: Diagnosis not present

## 2019-07-06 DIAGNOSIS — M25571 Pain in right ankle and joints of right foot: Secondary | ICD-10-CM | POA: Diagnosis not present

## 2019-07-06 DIAGNOSIS — M6281 Muscle weakness (generalized): Secondary | ICD-10-CM | POA: Diagnosis not present

## 2019-07-06 DIAGNOSIS — R262 Difficulty in walking, not elsewhere classified: Secondary | ICD-10-CM | POA: Diagnosis not present

## 2019-07-06 DIAGNOSIS — M79671 Pain in right foot: Secondary | ICD-10-CM | POA: Diagnosis not present

## 2019-07-07 ENCOUNTER — Other Ambulatory Visit: Payer: Self-pay

## 2019-07-10 DIAGNOSIS — R262 Difficulty in walking, not elsewhere classified: Secondary | ICD-10-CM | POA: Diagnosis not present

## 2019-07-10 DIAGNOSIS — M6281 Muscle weakness (generalized): Secondary | ICD-10-CM | POA: Diagnosis not present

## 2019-07-10 DIAGNOSIS — M79671 Pain in right foot: Secondary | ICD-10-CM | POA: Diagnosis not present

## 2019-07-10 DIAGNOSIS — M25571 Pain in right ankle and joints of right foot: Secondary | ICD-10-CM | POA: Diagnosis not present

## 2019-07-10 DIAGNOSIS — M25471 Effusion, right ankle: Secondary | ICD-10-CM | POA: Diagnosis not present

## 2019-07-13 DIAGNOSIS — R262 Difficulty in walking, not elsewhere classified: Secondary | ICD-10-CM | POA: Diagnosis not present

## 2019-07-13 DIAGNOSIS — M6281 Muscle weakness (generalized): Secondary | ICD-10-CM | POA: Diagnosis not present

## 2019-07-13 DIAGNOSIS — M79671 Pain in right foot: Secondary | ICD-10-CM | POA: Diagnosis not present

## 2019-07-13 DIAGNOSIS — M25471 Effusion, right ankle: Secondary | ICD-10-CM | POA: Diagnosis not present

## 2019-07-13 DIAGNOSIS — M25571 Pain in right ankle and joints of right foot: Secondary | ICD-10-CM | POA: Diagnosis not present

## 2019-07-14 ENCOUNTER — Ambulatory Visit (INDEPENDENT_AMBULATORY_CARE_PROVIDER_SITE_OTHER): Payer: Medicare Other | Admitting: Podiatry

## 2019-07-14 ENCOUNTER — Other Ambulatory Visit: Payer: Self-pay

## 2019-07-14 DIAGNOSIS — M2142 Flat foot [pes planus] (acquired), left foot: Secondary | ICD-10-CM | POA: Diagnosis not present

## 2019-07-14 DIAGNOSIS — I2510 Atherosclerotic heart disease of native coronary artery without angina pectoris: Secondary | ICD-10-CM | POA: Diagnosis not present

## 2019-07-14 DIAGNOSIS — M779 Enthesopathy, unspecified: Secondary | ICD-10-CM

## 2019-07-14 DIAGNOSIS — L84 Corns and callosities: Secondary | ICD-10-CM | POA: Diagnosis not present

## 2019-07-14 DIAGNOSIS — M2141 Flat foot [pes planus] (acquired), right foot: Secondary | ICD-10-CM | POA: Diagnosis not present

## 2019-07-15 DIAGNOSIS — K219 Gastro-esophageal reflux disease without esophagitis: Secondary | ICD-10-CM | POA: Diagnosis not present

## 2019-07-15 DIAGNOSIS — R1310 Dysphagia, unspecified: Secondary | ICD-10-CM | POA: Diagnosis not present

## 2019-07-15 DIAGNOSIS — K449 Diaphragmatic hernia without obstruction or gangrene: Secondary | ICD-10-CM | POA: Diagnosis not present

## 2019-07-15 NOTE — Progress Notes (Signed)
Subjective: 85 year old male presents the office today for follow-up evaluation of right ankle pain.  He states he has been doing physical therapy but this is not been helpful.  He states that he wears a regular shoe but when the ankle starts to hurt he wears the [place] when it starts to hurt makes the foot feel better.  He is asking if he should wear this all the time she is also asking for soft insert to wear inside of his shoes. Denies any systemic complaints such as fevers, chills, nausea, vomiting. No acute changes since last appointment, and no other complaints at this time.   Objective: AAO x3, NAD DP/PT pulses palpable bilaterally, CRT less than 3 seconds Still some tenderness palpated overall clinically appears to be improved.  Most the tenderness is the lateral aspect of the ankle as well as the subtalar joint.  There is minimal edema there is no erythema or warmth.  There is no area of pinpoint tenderness.  Significant flatfoot is present and there is prominence of the talar head on the plantar medial aspect.  No open sores but minimal hyperkeratotic tissue.  No open lesions or pre-ulcerative lesions.  No pain with calf compression, swelling, warmth, erythema  Assessment: Significant flatfoot deformity resulting in chronic ankle pain; callus  Plan: -All treatment options discussed with the patient including all alternatives, risks, complications.  -I discussed with him wearing the [place] brace at all times.  I discontinued physical therapy as he thinks reaction makes the ankle hurt more.  He states that after he does therapy ankle hurts afterwards but not during.  He was asked per insert for his shoes.  Given the brace I think it would be too bulky.  He did not follow-up with Liliane Channel for modification of the brace to help offload the talar prominence.  I debrided the hyperkeratotic tissue today without any complications. -Patient encouraged to call the office with any questions,  concerns, change in symptoms.   Trula Slade DPM

## 2019-07-20 ENCOUNTER — Other Ambulatory Visit: Payer: Self-pay

## 2019-07-20 MED ORDER — AMLODIPINE BESYLATE 5 MG PO TABS: 5.0000 mg | ORAL_TABLET | Freq: Every day | ORAL | 1 refills | Status: DC

## 2019-08-07 DIAGNOSIS — Z23 Encounter for immunization: Secondary | ICD-10-CM | POA: Diagnosis not present

## 2019-08-13 DIAGNOSIS — M19031 Primary osteoarthritis, right wrist: Secondary | ICD-10-CM | POA: Diagnosis not present

## 2019-08-13 DIAGNOSIS — M1711 Unilateral primary osteoarthritis, right knee: Secondary | ICD-10-CM | POA: Diagnosis not present

## 2019-08-27 DIAGNOSIS — Z23 Encounter for immunization: Secondary | ICD-10-CM | POA: Diagnosis not present

## 2019-08-28 DIAGNOSIS — M1711 Unilateral primary osteoarthritis, right knee: Secondary | ICD-10-CM | POA: Diagnosis not present

## 2019-09-01 ENCOUNTER — Other Ambulatory Visit: Payer: Self-pay

## 2019-09-01 ENCOUNTER — Ambulatory Visit (INDEPENDENT_AMBULATORY_CARE_PROVIDER_SITE_OTHER): Payer: Medicare Other | Admitting: Podiatry

## 2019-09-01 DIAGNOSIS — M779 Enthesopathy, unspecified: Secondary | ICD-10-CM

## 2019-09-01 DIAGNOSIS — M2141 Flat foot [pes planus] (acquired), right foot: Secondary | ICD-10-CM

## 2019-09-01 DIAGNOSIS — M7751 Other enthesopathy of right foot: Secondary | ICD-10-CM

## 2019-09-01 DIAGNOSIS — M2142 Flat foot [pes planus] (acquired), left foot: Secondary | ICD-10-CM

## 2019-09-01 NOTE — Progress Notes (Signed)
Subjective: 85 year old male presents the office today for concerns of chronic right ankle pain.  He does not know the source he used to be but still having pain to the ankle.  Asked me to see if he can adjust the [place] brace to fit better into his shoes.  No recent injury or falls. Denies any systemic complaints such as fevers, chills, nausea, vomiting. No acute changes since last appointment, and no other complaints at this time.   Objective: AAO x3, NAD DP/PT pulses palpable bilaterally, CRT less than 3 seconds Severe flatfoot is present.  Tenderness palpation on the anterior lateral ankle joint as well as the sinus tarsi.  Significantly restricted range of motion of the subtalar joint decreased range of motion ankle joint.  No area pinpoint tenderness.  Mild chronic edema present along the lateral ankle, sinus tarsi.  No pain with calf compression, swelling, warmth, erythema  Assessment: Significant arthritis, flatfoot right foot capsulitis  Plan: -All treatment options discussed with the patient including all alternatives, risks, complications.  -Steroid injection for the right ankle performed today.  See procedure note below. -Rick evaluated the patients [place] brace today for modifications.  -Patient encouraged to call the office with any questions, concerns, change in symptoms.   Procedure: Injection Intermediate Joint  Discussed alternatives, risks, complications and verbal consent was obtained.  Location: Right ankle joint Skin Prep: Betadine  Injectate: 0.5cc 0.5% marcaine plain, 0.5 cc 2% lidocaine plain and, 1 cc kenalog 10. Disposition: Patient tolerated procedure well. Injection site dressed with a band-aid.  Post-injection care was discussed and return precautions discussed.   Trula Slade DPM

## 2019-09-08 ENCOUNTER — Other Ambulatory Visit: Payer: Self-pay | Admitting: Internal Medicine

## 2019-09-09 DIAGNOSIS — M1711 Unilateral primary osteoarthritis, right knee: Secondary | ICD-10-CM | POA: Diagnosis not present

## 2019-09-10 ENCOUNTER — Ambulatory Visit: Payer: Medicare Other | Admitting: Orthotics

## 2019-09-10 ENCOUNTER — Other Ambulatory Visit: Payer: Self-pay

## 2019-09-10 DIAGNOSIS — L84 Corns and callosities: Secondary | ICD-10-CM

## 2019-09-10 DIAGNOSIS — M779 Enthesopathy, unspecified: Secondary | ICD-10-CM

## 2019-09-10 DIAGNOSIS — M84363D Stress fracture, right fibula, subsequent encounter for fracture with routine healing: Secondary | ICD-10-CM

## 2019-09-10 DIAGNOSIS — M2141 Flat foot [pes planus] (acquired), right foot: Secondary | ICD-10-CM

## 2019-09-10 NOTE — Progress Notes (Signed)
Added p-lite offload to lateral border of AFO

## 2019-09-21 ENCOUNTER — Other Ambulatory Visit: Payer: Self-pay

## 2019-09-21 MED ORDER — FLUOCINONIDE 0.05 % EX CREA: 1.0000 "application " | TOPICAL_CREAM | Freq: Two times a day (BID) | CUTANEOUS | 1 refills | Status: DC

## 2019-10-03 ENCOUNTER — Other Ambulatory Visit: Payer: Self-pay | Admitting: Nurse Practitioner

## 2019-10-03 DIAGNOSIS — J069 Acute upper respiratory infection, unspecified: Secondary | ICD-10-CM

## 2019-12-23 DIAGNOSIS — H401131 Primary open-angle glaucoma, bilateral, mild stage: Secondary | ICD-10-CM | POA: Diagnosis not present

## 2019-12-23 DIAGNOSIS — H04123 Dry eye syndrome of bilateral lacrimal glands: Secondary | ICD-10-CM | POA: Diagnosis not present

## 2019-12-23 DIAGNOSIS — Z961 Presence of intraocular lens: Secondary | ICD-10-CM | POA: Diagnosis not present

## 2019-12-23 DIAGNOSIS — H33052 Total retinal detachment, left eye: Secondary | ICD-10-CM | POA: Diagnosis not present

## 2019-12-27 ENCOUNTER — Ambulatory Visit: Payer: Medicare Other | Attending: Internal Medicine

## 2019-12-27 DIAGNOSIS — Z23 Encounter for immunization: Secondary | ICD-10-CM | POA: Insufficient documentation

## 2019-12-28 NOTE — Progress Notes (Signed)
   Covid-19 Vaccination Clinic  Name:  MAKO PELFREY    MRN: [id number] DOB: 07/13/1926  12/27/2019  Mr. Parlin was observed post Covid-19 immunization for 15 minutes without incidence. He was provided with Vaccine Information Sheet and instruction to access the V-Safe system.   Mr. Jaquith was instructed to call 911 with any severe reactions post vaccine: Marland Kitchen Difficulty breathing  . Swelling of your face and throat  . A fast heartbeat  . A bad rash all over your body  . Dizziness and weakness    Immunizations Administered    Name Date Dose VIS Date Route   Moderna COVID-19 Vaccine 12/27/2019  2:10 PM 0.5 mL 11/03/2019 Intramuscular   Manufacturer: Levan Hurst   Lot: [id number]   [place]: 41937-902-40      Documented on behalf of: C. Jerline Pain

## 2019-12-29 ENCOUNTER — Telehealth: Payer: Self-pay

## 2019-12-29 NOTE — Telephone Encounter (Signed)
The pt said that he wanted to let the office know if it's freezing rain in the morning that he will have to reschedule his appts.   The pt was told to call and reschedule if he is unable to make his appt.

## 2019-12-30 ENCOUNTER — Ambulatory Visit (INDEPENDENT_AMBULATORY_CARE_PROVIDER_SITE_OTHER): Payer: Medicare Other

## 2019-12-30 ENCOUNTER — Encounter: Payer: Self-pay | Admitting: Internal Medicine

## 2019-12-30 ENCOUNTER — Other Ambulatory Visit: Payer: Self-pay

## 2019-12-30 ENCOUNTER — Ambulatory Visit (INDEPENDENT_AMBULATORY_CARE_PROVIDER_SITE_OTHER): Payer: Medicare Other | Admitting: Internal Medicine

## 2019-12-30 VITALS — BP 112/66 | HR 67 | Temp 98.6°F | Ht 64.2 in | Wt 161.2 lb

## 2019-12-30 VITALS — BP 112/66 | HR 67 | Temp 98.6°F | Ht 64.0 in | Wt 161.2 lb

## 2019-12-30 DIAGNOSIS — E663 Overweight: Secondary | ICD-10-CM

## 2019-12-30 DIAGNOSIS — R682 Dry mouth, unspecified: Secondary | ICD-10-CM

## 2019-12-30 DIAGNOSIS — I2510 Atherosclerotic heart disease of native coronary artery without angina pectoris: Secondary | ICD-10-CM

## 2019-12-30 DIAGNOSIS — Z0000 Encounter for general adult medical examination without abnormal findings: Secondary | ICD-10-CM

## 2019-12-30 DIAGNOSIS — N183 Chronic kidney disease, stage 3 unspecified: Secondary | ICD-10-CM

## 2019-12-30 DIAGNOSIS — I1310 Hypertensive heart and chronic kidney disease without heart failure, with stage 1 through stage 4 chronic kidney disease, or unspecified chronic kidney disease: Secondary | ICD-10-CM | POA: Diagnosis not present

## 2019-12-30 DIAGNOSIS — M159 Polyosteoarthritis, unspecified: Secondary | ICD-10-CM | POA: Diagnosis not present

## 2019-12-30 DIAGNOSIS — Z6827 Body mass index (BMI) 27.0-27.9, adult: Secondary | ICD-10-CM

## 2019-12-30 DIAGNOSIS — R7309 Other abnormal glucose: Secondary | ICD-10-CM

## 2019-12-30 DIAGNOSIS — I701 Atherosclerosis of renal artery: Secondary | ICD-10-CM

## 2019-12-30 NOTE — Patient Instructions (Signed)

## 2019-12-30 NOTE — Patient Instructions (Signed)
Wayne [name] , Thank you for taking time to come for your Medicare Wellness Visit. I appreciate your ongoing commitment to your health goals. Please review the following plan we discussed and let me know if I can assist you in the future.   Screening recommendations/referrals: Colonoscopy: not required Recommended yearly ophthalmology/optometry visit for glaucoma screening and checkup Recommended yearly dental visit for hygiene and checkup  Vaccinations: Influenza vaccine: 08/2019 Pneumococcal vaccine: 08/2019 Tdap vaccine: 08/2011 Shingles vaccine: discussed    Advanced directives: Please bring a copy of your POA (Power of Kite) and/or Living Will to your next appointment.    Conditions/risks identified: overweight  Next appointment: 06/28/2020 at 2:00  Preventive Care 84 Years and Older, Male Preventive care refers to lifestyle choices and visits with your health care provider that can promote health and wellness. What does preventive care include?  A yearly physical exam. This is also called an annual well check.  Dental exams once or twice a year.  Routine eye exams. Ask your health care provider how often you should have your eyes checked.  Personal lifestyle choices, including:  Daily care of your teeth and gums.  Regular physical activity.  Eating a healthy diet.  Avoiding tobacco and drug use.  Limiting alcohol use.  Practicing safe sex.  Taking low doses of aspirin every day.  Taking vitamin and mineral supplements as recommended by your health care provider. What happens during an annual well check? The services and screenings done by your health care provider during your annual well check will depend on your age, overall health, lifestyle risk factors, and family history of disease. Counseling  Your health care provider may ask you questions about your:  Alcohol use.  Tobacco use.  Drug use.  Emotional well-being.  Home and relationship  well-being.  Sexual activity.  Eating habits.  History of falls.  Memory and ability to understand (cognition).  Work and work Statistician. Screening  You may have the following tests or measurements:  Height, weight, and BMI.  Blood pressure.  Lipid and cholesterol levels. These may be checked every 5 years, or more frequently if you are over 84 years old.  Skin check.  Lung cancer screening. You may have this screening every year starting at age 84 if you have a 30-pack-year history of smoking and currently smoke or have quit within the past 15 years.  Fecal occult blood test (FOBT) of the stool. You may have this test every year starting at age 84.  Flexible sigmoidoscopy or colonoscopy. You may have a sigmoidoscopy every 5 years or a colonoscopy every 10 years starting at age 84.  Prostate cancer screening. Recommendations will vary depending on your family history and other risks.  Hepatitis C blood test.  Hepatitis B blood test.  Sexually transmitted disease (STD) testing.  Diabetes screening. This is done by checking your blood sugar (glucose) after you have not eaten for a while (fasting). You may have this done every 1-3 years.  Abdominal aortic aneurysm (AAA) screening. You may need this if you are a current or former smoker.  Osteoporosis. You may be screened starting at age 84 if you are at high risk. Talk with your health care provider about your test results, treatment options, and if necessary, the need for more tests. Vaccines  Your health care provider may recommend certain vaccines, such as:  Influenza vaccine. This is recommended every year.  Tetanus, diphtheria, and acellular pertussis (Tdap, Td) vaccine. You may need a Td booster  every 10 years.  Zoster vaccine. You may need this after age 18.  Pneumococcal 13-valent conjugate (PCV13) vaccine. One dose is recommended after age 84.  Pneumococcal polysaccharide (PPSV23) vaccine. One dose is  recommended after age 15. Talk to your health care provider about which screenings and vaccines you need and how often you need them. This information is not intended to replace advice given to you by your health care provider. Make sure you discuss any questions you have with your health care provider. Document Released: 12/16/2015 Document Revised: 08/08/2016 Document Reviewed: 09/20/2015 Elsevier Interactive Patient Education  2017 [place] Prevention in the Home Falls can cause injuries. They can happen to people of all ages. There are many things you can do to make your home safe and to help prevent falls. What can I do on the outside of my home?  Regularly fix the edges of walkways and driveways and fix any cracks.  Remove anything that might make you trip as you walk through a door, such as a raised step or threshold.  Trim any bushes or trees on the path to your home.  Use bright outdoor lighting.  Clear any walking paths of anything that might make someone trip, such as rocks or tools.  Regularly check to see if handrails are loose or broken. Make sure that both sides of any steps have handrails.  Any raised decks and porches should have guardrails on the edges.  Have any leaves, snow, or ice cleared regularly.  Use sand or salt on walking paths during winter.  Clean up any spills in your garage right away. This includes oil or grease spills. What can I do in the bathroom?  Use night lights.  Install grab bars by the toilet and in the tub and shower. Do not use towel bars as grab bars.  Use non-skid mats or decals in the tub or shower.  If you need to sit down in the shower, use a plastic, non-slip stool.  Keep the floor dry. Clean up any water that spills on the floor as soon as it happens.  Remove soap buildup in the tub or shower regularly.  Attach bath mats securely with double-sided non-slip rug tape.  Do not have throw rugs and other things on  the floor that can make you trip. What can I do in the bedroom?  Use night lights.  Make sure that you have a light by your bed that is easy to reach.  Do not use any sheets or blankets that are too big for your bed. They should not hang down onto the floor.  Have a firm chair that has side arms. You can use this for support while you get dressed.  Do not have throw rugs and other things on the floor that can make you trip. What can I do in the kitchen?  Clean up any spills right away.  Avoid walking on wet floors.  Keep items that you use a lot in easy-to-reach places.  If you need to reach something above you, use a strong step stool that has a grab bar.  Keep electrical cords out of the way.  Do not use floor polish or wax that makes floors slippery. If you must use wax, use non-skid floor wax.  Do not have throw rugs and other things on the floor that can make you trip. What can I do with my stairs?  Do not leave any items on the stairs.  Make  sure that there are handrails on both sides of the stairs and use them. Fix handrails that are broken or loose. Make sure that handrails are as long as the stairways.  Check any carpeting to make sure that it is firmly attached to the stairs. Fix any carpet that is loose or worn.  Avoid having throw rugs at the top or bottom of the stairs. If you do have throw rugs, attach them to the floor with carpet tape.  Make sure that you have a light switch at the top of the stairs and the bottom of the stairs. If you do not have them, ask someone to add them for you. What else can I do to help prevent falls?  Wear shoes that:  Do not have high heels.  Have rubber bottoms.  Are comfortable and fit you well.  Are closed at the toe. Do not wear sandals.  If you use a stepladder:  Make sure that it is fully opened. Do not climb a closed stepladder.  Make sure that both sides of the stepladder are locked into place.  Ask someone to  hold it for you, if possible.  Clearly mark and make sure that you can see:  Any grab bars or handrails.  First and last steps.  Where the edge of each step is.  Use tools that help you move around (mobility aids) if they are needed. These include:  Canes.  Walkers.  Scooters.  Crutches.  Turn on the lights when you go into a dark area. Replace any light bulbs as soon as they burn out.  Set up your furniture so you have a clear path. Avoid moving your furniture around.  If any of your floors are uneven, fix them.  If there are any pets around you, be aware of where they are.  Review your medicines with your doctor. Some medicines can make you feel dizzy. This can increase your chance of falling. Ask your doctor what other things that you can do to help prevent falls. This information is not intended to replace advice given to you by your health care provider. Make sure you discuss any questions you have with your health care provider. Document Released: 09/15/2009 Document Revised: 04/26/2016 Document Reviewed: 12/24/2014 Elsevier Interactive Patient Education  2017 [employer].

## 2019-12-30 NOTE — Progress Notes (Signed)
This visit occurred during the SARS-CoV-2 public health emergency.  Safety protocols were in place, including screening questions prior to the visit, additional usage of staff PPE, and extensive cleaning of exam room while observing appropriate contact time as indicated for disinfecting solutions.  Subjective:   Wayne [name] is a 86 y.o. male who presents for Medicare Annual/Subsequent preventive examination.  Review of Systems:  n/a Cardiac Risk Factors include: advanced age (>49men, >48 women);hypertension;male gender     Objective:    Vitals: BP 112/66 (Patient Position: Sitting)   Pulse 67   Temp 98.6 F (37 C) (Oral)   Ht 5\' 4"  (1.626 m)   Wt 161 lb 3.2 oz (73.1 kg)   BMI 27.67 kg/m   Body mass index is 27.67 kg/m.  Advanced Directives 12/30/2019 12/25/2018 08/07/2018 08/10/2017 11/22/2015 11/21/2015 06/03/2015  Does Patient Have a Medical Advance Directive? Yes Yes No Yes No No Yes  Type of Advance Directive Living will [place];Living will - Living will - - Living will;Healthcare Power of Attorney  Does patient want to make changes to medical advance directive? - No - Patient declined - - - - -  Copy of Healthcare Power of Attorney in Chart? - No - copy requested - - - - No - copy requested  Would patient like information on creating a medical advance directive? - - No - Patient declined - No - patient declined information No - patient declined information -  Pre-existing out of facility DNR order (yellow form or pink MOST form) - - - - - - -    Tobacco Social History   Tobacco Use  Smoking Status Former Smoker  . Packs/day: 0.25  . Years: 3.00  . Pack years: 0.75  . Types: Cigarettes  . Quit date: 06/02/1972  . Years since quitting: 47.6  Smokeless Tobacco Never Used     Counseling given: Not Answered   Clinical Intake:  Pre-visit preparation completed: Yes  Pain : No/denies pain     Nutritional Status: BMI 25 -29 Overweight Nutritional  Risks: None Diabetes: No     Interpreter Needed?: No  Information entered by :: NAllen LPN  Past Medical History:  Diagnosis Date  . Arthritis   . Cataract   . Glaucoma   . Hypertension    Past Surgical History:  Procedure Laterality Date  . APPENDECTOMY    . BACK SURGERY    . ballon angioplasty  03/18/2012  . FOOT SURGERY    . JOINT REPLACEMENT    . kidney stent Left 2015  . KNEE SURGERY Left x 2   . left ankle    . LEFT HEART CATHETERIZATION WITH CORONARY ANGIOGRAM N/A 03/18/2012   Procedure: LEFT HEART CATHETERIZATION WITH CORONARY ANGIOGRAM;  Surgeon: Laverda Page, MD;  Location: [facility] CATH LAB;  Service: Cardiovascular;  Laterality: N/A;  . RENAL ANGIOGRAM N/A 03/18/2012   Procedure: RENAL ANGIOGRAM;  Surgeon: Laverda Page, MD;  Location: [facility] CATH LAB;  Service: Cardiovascular;  Laterality: N/A;  . right wrist    . SPINE SURGERY     Family History  Problem Relation Age of Onset  . Dementia Mother   . Cancer Father    Social History   Socioeconomic History  . Marital status: Widowed    Spouse name: Not on file  . Number of children: 2  . Years of education: 12.5  . Highest education level: Not on file  Occupational History  . Occupation: retired  Comment: [employer]  Tobacco Use  . Smoking status: Former Smoker    Packs/day: 0.25    Years: 3.00    Pack years: 0.75    Types: Cigarettes    Quit date: 06/02/1972    Years since quitting: 47.6  . Smokeless tobacco: Never Used  Substance and Sexual Activity  . Alcohol use: Not Currently  . Drug use: Yes    Types: Oxycodone  . Sexual activity: Not Currently  Other Topics Concern  . Not on file  Social History Narrative   Lives at home by himself   Caffeine use- none   Social Determinants of Health   Financial Resource Strain: Low Risk   . Difficulty of Paying Living Expenses: Not hard at all  Food Insecurity: No Food Insecurity  . Worried About Charity fundraiser in the Last Year: Never true    . Ran Out of Food in the Last Year: Never true  Transportation Needs: No Transportation Needs  . Lack of Transportation (Medical): No  . Lack of Transportation (Non-Medical): No  Physical Activity: Insufficiently Active  . Days of Exercise per Week: 3 days  . Minutes of Exercise per Session: 30 min  Stress: No Stress Concern Present  . Feeling of Stress : Not at all  Social Connections:   . Frequency of Communication with Friends and Family: Not on file  . Frequency of Social Gatherings with Friends and Family: Not on file  . Attends Religious Services: Not on file  . Active Member of Clubs or Organizations: Not on file  . Attends Archivist Meetings: Not on file  . Marital Status: Not on file    Outpatient Encounter Medications as of 12/30/2019  Medication Sig  . amLODipine (NORVASC) 5 MG tablet Take 1 tablet (5 mg total) by mouth daily.  Marland Kitchen aspirin EC 81 MG tablet Take 81 mg by mouth daily.  . B Complex-C (B-COMPLEX WITH VITAMIN C) tablet Take 1 tablet by mouth daily.  . Cholecalciferol (VITAMIN D3) 125 MCG (5000 UT) CAPS Take by mouth daily. Monday, Wednesday, Friday  . famotidine (PEPCID) 40 MG tablet   . fluocinonide cream (LIDEX) 3.78 % Apply 1 application topically 2 (two) times daily.  Marland Kitchen ketoconazole (NIZORAL) 2 % cream Apply 1 application topically daily.  Marland Kitchen loratadine (CLARITIN) 10 MG tablet Take 1 tablet (10 mg total) by mouth daily.  . Multiple Vitamin (MULTIVITAMIN WITH MINERALS) TABS tablet Take 1 tablet by mouth daily.  . Omega-3 Fatty Acids (FISH OIL) 1200 MG CAPS Take 1 capsule by mouth 2 (two) times daily.  Marland Kitchen oxyCODONE-acetaminophen (PERCOCET/ROXICET) 5-325 MG tablet Take 1 tablet by mouth daily as needed for pain.  . tamsulosin (FLOMAX) 0.4 MG CAPS capsule   . timolol (BETIMOL) 0.5 % ophthalmic solution Place 1 drop into both eyes daily.   . valsartan-hydrochlorothiazide (DIOVAN-HCT) 80-12.5 MG tablet TAKE 1 TABLET BY MOUTH  DAILY   No  facility-administered encounter medications on file as of 12/30/2019.    Activities of Daily Living In your present state of health, do you have any difficulty performing the following activities: 12/30/2019 12/30/2019  Hearing? Y Y  Comment loss of hearing in left ear -  Vision? N N  Difficulty concentrating or making decisions? N Y  Walking or climbing stairs? Y Y  Dressing or bathing? N Y  Doing errands, shopping? N Y  Conservation officer, nature and eating ? N -  Using the Toilet? N -  In the past six months, have  you accidently leaked urine? N -  Do you have problems with loss of bowel control? N -  Managing your Medications? N -  Managing your Finances? N -  Housekeeping or managing your Housekeeping? N -  Some recent data might be hidden    Patient Care Team: Glendale Chard, MD as PCP - General (Internal Medicine) Warden Fillers, MD as Consulting Physician (Ophthalmology)   Assessment:   This is a routine wellness examination for [place].  Exercise Activities and Dietary recommendations Current Exercise Habits: Home exercise routine, Type of exercise: strength training/weights, Time (Minutes): 30, Frequency (Times/Week): 3, Weekly Exercise (Minutes/Week): 90  Goals    . Patient Stated (pt-stated)     Wants to remain in best health that he can be in    . Patient Stated     12/30/2019, no goals       Fall Risk Fall Risk  12/30/2019 06/30/2019 03/31/2019 03/09/2019 12/25/2018  Falls in the past year? 0 1 0 0 1  Comment - - - - -  Number falls in past yr: - 0 - - 0  Comment - - - - lost balance due to equilibrium  Injury with Fall? - 0 - - 0  Comment - - - - -  Risk for fall due to : Impaired balance/gait;Medication side effect - - - History of fall(s);Impaired balance/gait;Medication side effect  Risk for fall due to: Comment - - - - -  Follow up Falls evaluation completed;Education provided;Falls prevention discussed - - - Education provided;Falls prevention discussed   Is the  patient's home free of loose throw rugs in walkways, pet beds, electrical cords, etc?   yes      Grab bars in the bathroom? no      Handrails on the stairs?   yes      Adequate lighting?   yes  Timed Get Up and Go Performed: n/a  Depression Screen PHQ 2/9 Scores 12/30/2019 03/31/2019 03/09/2019 12/25/2018  PHQ - 2 Score 0 0 0 0  PHQ- 9 Score 3 - - 5    Cognitive Function     6CIT Screen 12/30/2019 12/25/2018  What Year? 0 points 0 points  What month? 0 points 0 points  What time? 0 points 0 points  Count back from 20 0 points 0 points  Months in reverse 0 points 0 points  Repeat phrase 4 points 2 points  Total Score 4 2    Immunization History  Administered Date(s) Administered  . Fluad Quad(high Dose 65+) 08/07/2019  . Influenza, High Dose Seasonal PF 09/06/2017  . Influenza-Unspecified 08/04/2018  . Moderna SARS-COVID-2 Vaccination 12/27/2019  . Pneumococcal Conjugate-13 08/27/2019  . Pneumococcal-Unspecified 06/18/2011  . Zoster Recombinat (Shingrix) 08/27/2019    Qualifies for Shingles Vaccine? yes  Screening Tests Health Maintenance  Topic Date Due  . FOOT EXAM  10/12/1936  . OPHTHALMOLOGY EXAM  12/03/2018  . HEMOGLOBIN A1C  06/29/2019  . TETANUS/TDAP  08/09/2021  . INFLUENZA VACCINE  Completed  . PNA vac Low Risk Adult  Completed   Cancer Screenings: Lung: Low Dose CT Chest recommended if Age 71-80 years, 30 pack-year currently smoking OR have quit w/in 15years. Patient does not qualify. Colorectal: not required  Additional Screenings:  Hepatitis C Screening:n/a      Plan:    Patient has no goals set at this time. He has received his first dose of the covid vaccine.  I have personally reviewed and noted the following in the patient's chart:   .  Medical and social history . Use of alcohol, tobacco or illicit drugs  . Current medications and supplements . Functional ability and status . Nutritional status . Physical activity . Advanced  directives . List of other physicians . Hospitalizations, surgeries, and ER visits in previous 12 months . Vitals . Screenings to include cognitive, depression, and falls . Referrals and appointments  In addition, I have reviewed and discussed with patient certain preventive protocols, quality metrics, and best practice recommendations. A written personalized care plan for preventive services as well as general preventive health recommendations were provided to patient.     Kellie Simmering, LPN  [id number]

## 2019-12-30 NOTE — Progress Notes (Signed)
This visit occurred during the SARS-CoV-2 public health emergency.  Safety protocols were in place, including screening questions prior to the visit, additional usage of staff PPE, and extensive cleaning of exam room while observing appropriate contact time as indicated for disinfecting solutions.  Subjective:     Patient ID: Wayne [name] , male    DOB: 05-24-1926 , 86 y.o.   MRN: [id number]   Chief Complaint  Patient presents with  . Hypertension    HPI  Hypertension This is a chronic problem. The current episode started more than 1 year ago. The problem has been gradually improving since onset. The problem is controlled. Pertinent negatives include no blurred vision, palpitations or shortness of breath. Past treatments include calcium channel blockers, angiotensin blockers, diuretics and lifestyle changes. The current treatment provides moderate improvement. Hypertensive end-organ damage includes kidney disease.     Past Medical History:  Diagnosis Date  . Arthritis   . Cataract   . Glaucoma   . Hypertension      Family History  Problem Relation Age of Onset  . Dementia Mother   . Cancer Father      Current Outpatient Medications:  .  amLODipine (NORVASC) 5 MG tablet, Take 1 tablet (5 mg total) by mouth daily., Disp: 90 tablet, Rfl: 1 .  aspirin EC 81 MG tablet, Take 81 mg by mouth daily., Disp: , Rfl:  .  B Complex-C (B-COMPLEX WITH VITAMIN C) tablet, Take 1 tablet by mouth daily., Disp: , Rfl:  .  Cholecalciferol (VITAMIN D3) 125 MCG (5000 UT) CAPS, Take by mouth daily. Monday, Wednesday, Friday, Disp: , Rfl:  .  famotidine (PEPCID) 40 MG tablet, , Disp: , Rfl:  .  fluocinonide cream (LIDEX) 4.28 %, Apply 1 application topically 2 (two) times daily., Disp: 30 g, Rfl: 1 .  ketoconazole (NIZORAL) 2 % cream, Apply 1 application topically daily., Disp: , Rfl:  .  loratadine (CLARITIN) 10 MG tablet, Take 1 tablet (10 mg total) by mouth daily., Disp: 90 tablet, Rfl: 2 .   Multiple Vitamin (MULTIVITAMIN WITH MINERALS) TABS tablet, Take 1 tablet by mouth daily., Disp: , Rfl:  .  Omega-3 Fatty Acids (FISH OIL) 1200 MG CAPS, Take 1 capsule by mouth 2 (two) times daily., Disp: , Rfl:  .  tamsulosin (FLOMAX) 0.4 MG CAPS capsule, , Disp: , Rfl:  .  timolol (BETIMOL) 0.5 % ophthalmic solution, Place 1 drop into both eyes daily. , Disp: , Rfl:  .  valsartan-hydrochlorothiazide (DIOVAN-HCT) 80-12.5 MG tablet, TAKE 1 TABLET BY MOUTH  DAILY, Disp: 90 tablet, Rfl: 3 .  oxyCODONE-acetaminophen (PERCOCET/ROXICET) 5-325 MG tablet, Take 1 tablet by mouth daily as needed for pain., Disp: , Rfl: 0   Allergies  Allergen Reactions  . Cephalexin     Severe mouth and throat dryness.     Review of Systems  Constitutional: Negative.   Eyes: Negative for blurred vision.  Respiratory: Negative.  Negative for shortness of breath.   Cardiovascular: Negative.  Negative for palpitations.  Gastrointestinal: Negative.   Musculoskeletal: Positive for arthralgias.  Neurological: Negative.   Psychiatric/Behavioral: Negative.      Today's Vitals   12/30/19 1411  BP: 112/66  Pulse: 67  Temp: 98.6 F (37 C)  TempSrc: Oral  Weight: 161 lb 3.2 oz (73.1 kg)  Height: 5' 4.2" (1.631 m)  PainSc: 8   PainLoc: Generalized   Body mass index is 27.5 kg/m.   Objective:  Physical Exam Vitals and nursing note reviewed.  Constitutional:      Appearance: Normal appearance.  Cardiovascular:     Rate and Rhythm: Normal rate and regular rhythm.     Heart sounds: Normal heart sounds.  Pulmonary:     Effort: Pulmonary effort is normal.     Breath sounds: Normal breath sounds.  Musculoskeletal:        General: Deformity present.     Comments: Arthritic changes b/l hands  Skin:    General: Skin is warm.  Neurological:     General: No focal deficit present.     Mental Status: He is alert.  Psychiatric:        Mood and Affect: Mood normal.         Assessment And Plan:     1.  Hypertensive heart and renal disease with renal failure, stage 1 through stage 4 or unspecified chronic kidney disease, without heart failure  Chronic, well controlled. He will continue with current meds.  He is encouraged to avoid adding salt to his foods. I will check renal function today. He will rto in six months for re-evaluation.   - CMP14+EGFR - CBC no Diff - Lipid panel  2. Stage 3 chronic kidney disease, unspecified whether stage 3a or 3b CKD  Chronic, I will check renal function. He is encouraged to keep BP controlled and stay hydrated to prevent progression of CKD.   3. Coronary artery disease involving native coronary artery of native heart without angina pectoris  Chronic, yet stable.   4. Renal artery atherosclerosis ([place])  He is s/p renal stent.   5. Generalized osteoarthritis of multiple sites  Chronic. He will continue with topical agents. He does not wish to take oral agents b/c he does not wish to worsen CKD.   6. Other abnormal glucose  HER A1C HAS BEEN ELEVATED IN THE PAST. I WILL CHECK AN A1C, BMET TODAY. SHE WAS ENCOURAGED TO AVOID SUGARY BEVERAGES AND PROCESSED FOODS INCLUDNG BREADS, RICE AND PASTA.  - Hemoglobin A1c  7. Mouth dryness  He is encouraged to stay well hydrated. Also advised to keep cup of ice chips next to his bed. He will continue with use of Biotene. He was advised to cut back on his dosing of loratadine which may be exacerbating his sx.   8. Overweight with body mass index (BMI) of 27 to 27.9 in adult  BMI stable for his demographic.   Wayne Greenland, MD    THE PATIENT IS ENCOURAGED TO PRACTICE SOCIAL DISTANCING DUE TO THE COVID-19 PANDEMIC.

## 2019-12-31 ENCOUNTER — Ambulatory Visit: Payer: Medicare Other

## 2019-12-31 ENCOUNTER — Ambulatory Visit: Payer: Medicare Other | Admitting: Internal Medicine

## 2019-12-31 LAB — CMP14+EGFR
ALT: 14 IU/L (ref 0–44)
AST: 23 IU/L (ref 0–40)
Albumin/Globulin Ratio: 1.4 (ref 1.2–2.2)
Albumin: 4.2 g/dL (ref 3.5–4.6)
Alkaline Phosphatase: 108 IU/L (ref 39–117)
BUN/Creatinine Ratio: 16 (ref 10–24)
BUN: 20 mg/dL (ref 10–36)
Bilirubin Total: 0.2 mg/dL (ref 0.0–1.2)
CO2: 26 mmol/L (ref 20–29)
Calcium: 9.4 mg/dL (ref 8.6–10.2)
Chloride: 104 mmol/L (ref 96–106)
Creatinine, Ser: 1.26 mg/dL (ref 0.76–1.27)
GFR calc Af Amer: 57 mL/min/{1.73_m2} — ABNORMAL LOW (ref >59)
GFR calc non Af Amer: 50 mL/min/{1.73_m2} — ABNORMAL LOW (ref >59)
Globulin, Total: 3 g/dL (ref 1.5–4.5)
Glucose: 100 mg/dL — ABNORMAL HIGH (ref 65–99)
Potassium: 4.4 mmol/L (ref 3.5–5.2)
Sodium: 142 mmol/L (ref 134–144)
Total Protein: 7.2 g/dL (ref 6.0–8.5)

## 2019-12-31 LAB — HEMOGLOBIN A1C
Est. average glucose Bld gHb Est-mCnc: 114 mg/dL
Hgb A1c MFr Bld: 5.6 % (ref 4.8–5.6)

## 2019-12-31 LAB — CBC
Hematocrit: 39.9 % (ref 37.5–51.0)
Hemoglobin: 13.4 g/dL (ref 13.0–17.7)
MCH: 33.3 pg — ABNORMAL HIGH (ref 26.6–33.0)
MCHC: 33.6 g/dL (ref 31.5–35.7)
MCV: 99 fL — ABNORMAL HIGH (ref 79–97)
Platelets: 251 10*3/uL (ref 150–450)
RBC: 4.03 x10E6/uL — ABNORMAL LOW (ref 4.14–5.80)
RDW: 12.6 % (ref 11.6–15.4)
WBC: 3.9 10*3/uL (ref 3.4–10.8)

## 2019-12-31 LAB — LIPID PANEL
Chol/HDL Ratio: 3.7 ratio (ref 0.0–5.0)
Cholesterol, Total: 190 mg/dL (ref 100–199)
HDL: 51 mg/dL (ref >39)
LDL Chol Calc (NIH): 117 mg/dL — ABNORMAL HIGH (ref 0–99)
Triglycerides: 124 mg/dL (ref 0–149)
VLDL Cholesterol Cal: 22 mg/dL (ref 5–40)

## 2020-01-06 ENCOUNTER — Other Ambulatory Visit: Payer: Self-pay

## 2020-01-06 MED ORDER — KETOCONAZOLE 2 % EX CREA: 1.0000 "application " | TOPICAL_CREAM | Freq: Every day | CUTANEOUS | 1 refills | Status: DC

## 2020-01-14 ENCOUNTER — Other Ambulatory Visit: Payer: Self-pay | Admitting: Internal Medicine

## 2020-01-21 ENCOUNTER — Ambulatory Visit: Payer: Medicare Other | Admitting: Internal Medicine

## 2020-01-21 ENCOUNTER — Ambulatory Visit: Payer: Medicare Other

## 2020-01-24 ENCOUNTER — Ambulatory Visit: Payer: Medicare Other | Attending: Internal Medicine

## 2020-01-24 DIAGNOSIS — Z23 Encounter for immunization: Secondary | ICD-10-CM | POA: Insufficient documentation

## 2020-01-24 NOTE — Progress Notes (Signed)
   Covid-19 Vaccination Clinic  Name:  Wayne [name]    MRN: [id number] DOB: 11-12-1926  01/24/2020  [name] was observed post Covid-19 immunization for 15 minutes without incidence. He was provided with Vaccine Information Sheet and instruction to access the V-Safe system.   [name] was instructed to call 911 with any severe reactions post vaccine: Marland Kitchen Difficulty breathing  . Swelling of your face and throat  . A fast heartbeat  . A bad rash all over your body  . Dizziness and weakness    Immunizations Administered    Name Date Dose VIS Date Route   Moderna COVID-19 Vaccine 01/24/2020 12:18 PM 0.5 mL 11/03/2019 Intramuscular   Manufacturer: Moderna   Lot: [id number]   [place]: [id number]

## 2020-01-30 IMAGING — RF ESOPHAGUS/BARIUM SWALLOW/TABLET STUDY
10 of 12 series · 14 of 24 positions shown · non-contrast
Comparison: None.

CLINICAL DATA: Dysphagia, food getting stuck

EXAM:
ESOPHOGRAM/BARIUM SWALLOW
TECHNIQUE: Combined double contrast and single contrast examination performed
using effervescent crystals, thick barium liquid, and thin barium
liquid.
FLUOROSCOPY TIME:  Fluoroscopy Time:  2 minutes 30 seconds
Radiation Exposure Index (if provided by the fluoroscopic device):
24.7 mGy
Number of Acquired Spot Images: 16

[Series 1: cp_standard · 0.51mm/px · 2 of 50 frames shown (1 of 7)]
[frame 8/50]
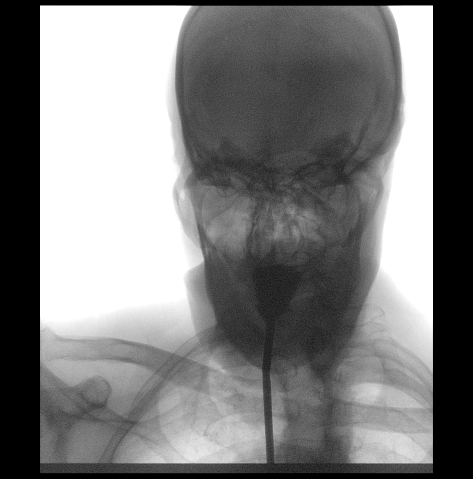
[frame 50/50]
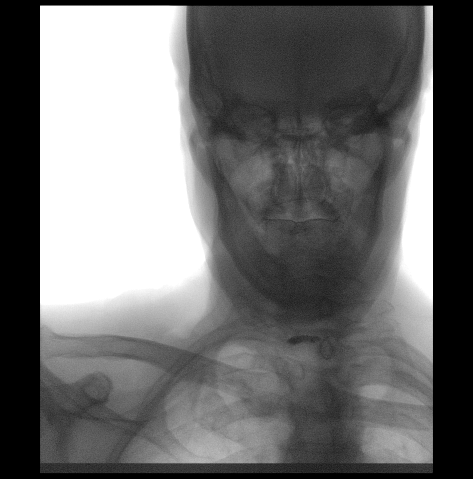

[Series 2: cp_standard · 0.51mm/px · 1 of 36 frames shown (2 of 7)]
[frame 19/36]
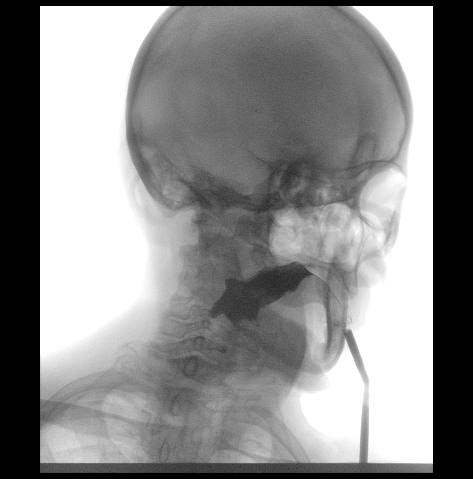

[Series 3: cp_standard · 0.51mm/px · 2 of 69 frames shown (3 of 7)]
[frame 35/69]
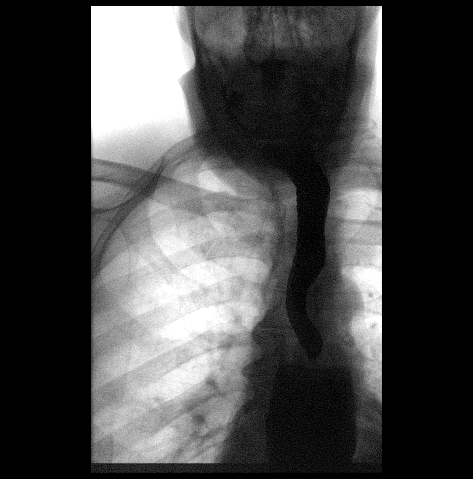
[frame 37/69]
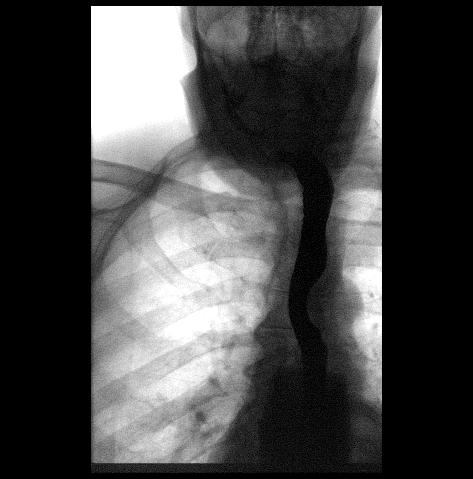

[Series 5: cp_standard · 0.51mm/px · 2 of 31 frames shown (4 of 7)]
[frame 5/31]
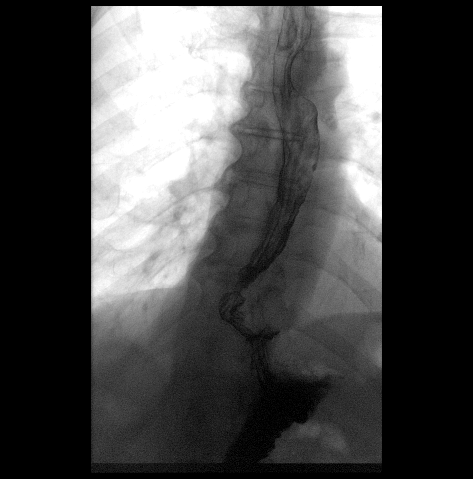
[frame 27/31]
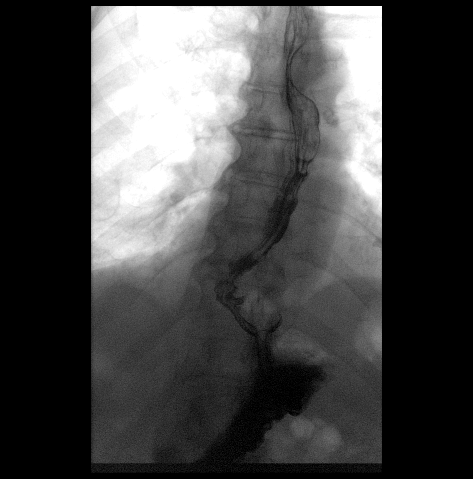

[Series 6: cp_standard · 0.51mm/px · 2 of 73 frames shown (5 of 7)]
[frame 11/73]
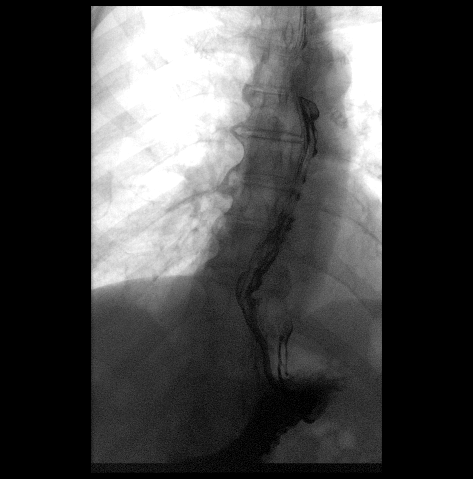
[frame 63/73]
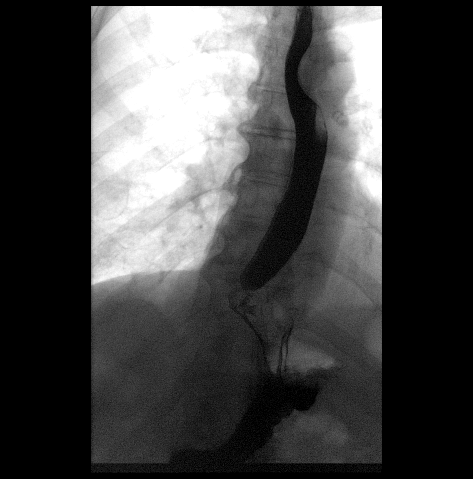

[Series 8: cp_standard · 0.51mm/px · 1 of 63 frames shown (6 of 7)]
[frame 32/63]
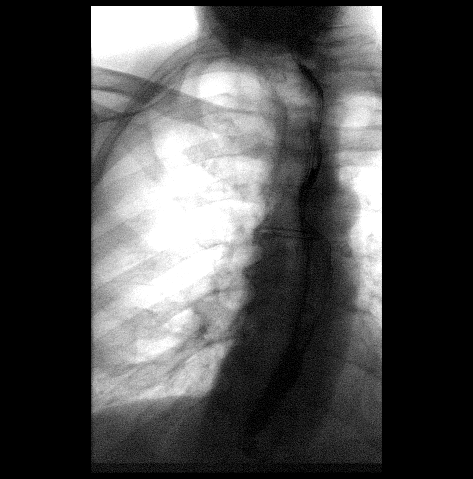

[Series 9: fluoro_barium 2fps_bw · 0.17mm/px · 1 of 1 slices shown (1 of 3)]
[im 1/1]
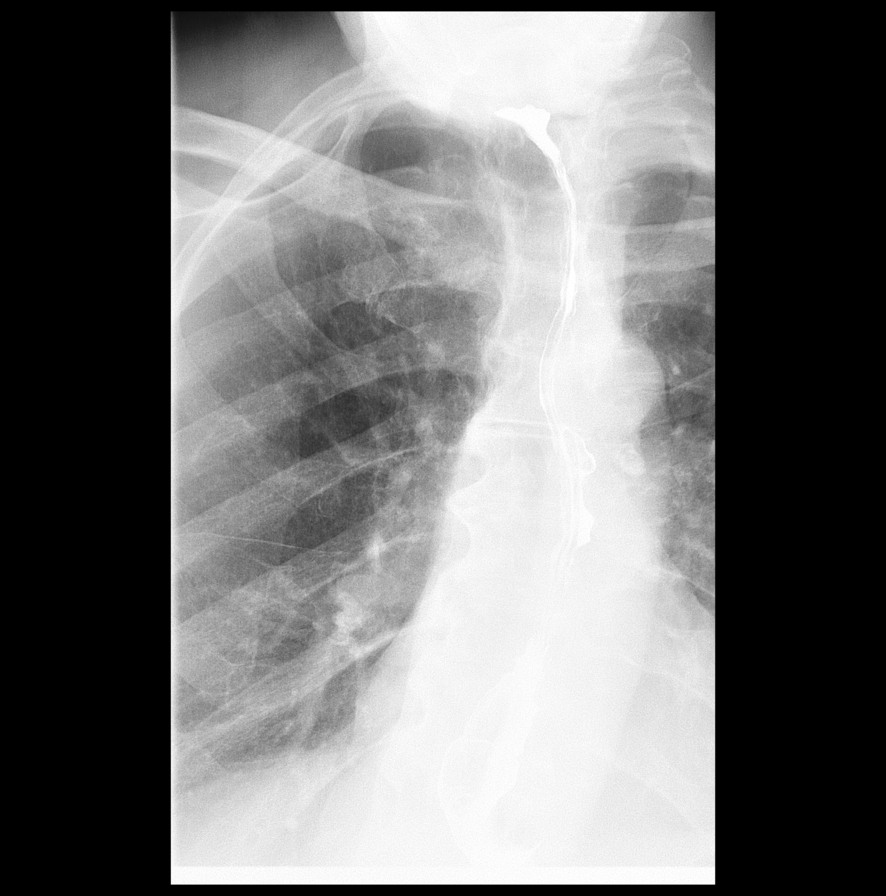

[Series 10: fluoro_barium 2fps_bw · 0.17mm/px · 1 of 1 slices shown (2 of 3)]
[im 1/1]
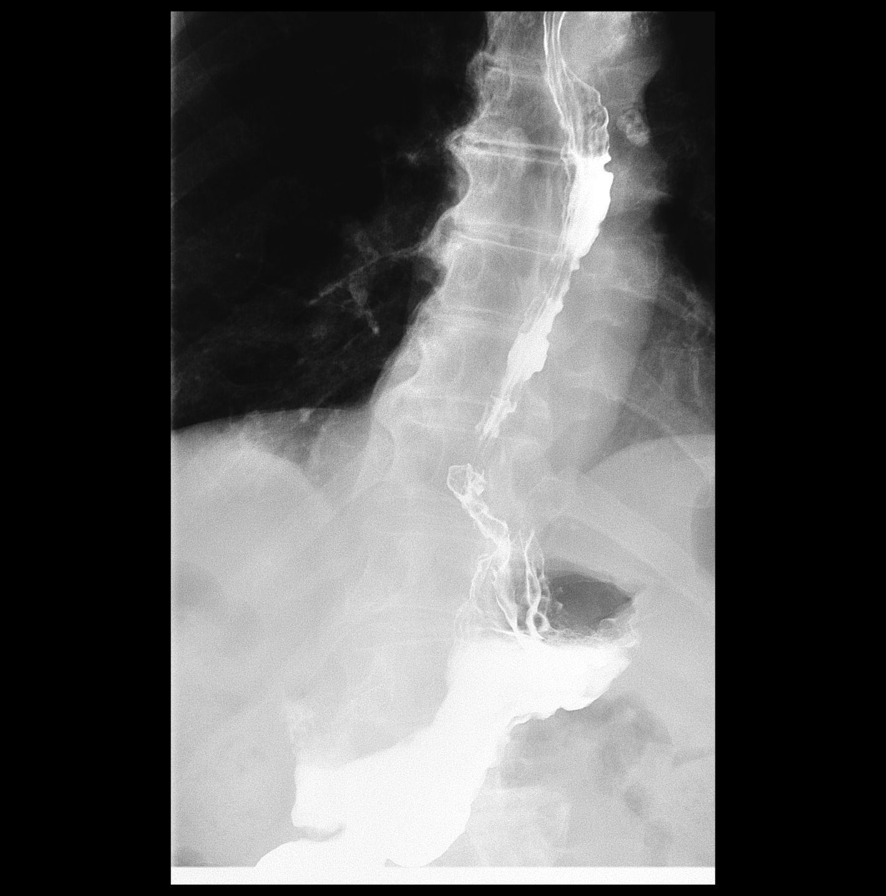

[Series 13: fluoro_barium 2fps_bw · 0.17mm/px · 1 of 1 slices shown (3 of 3)]
[im 1/1]
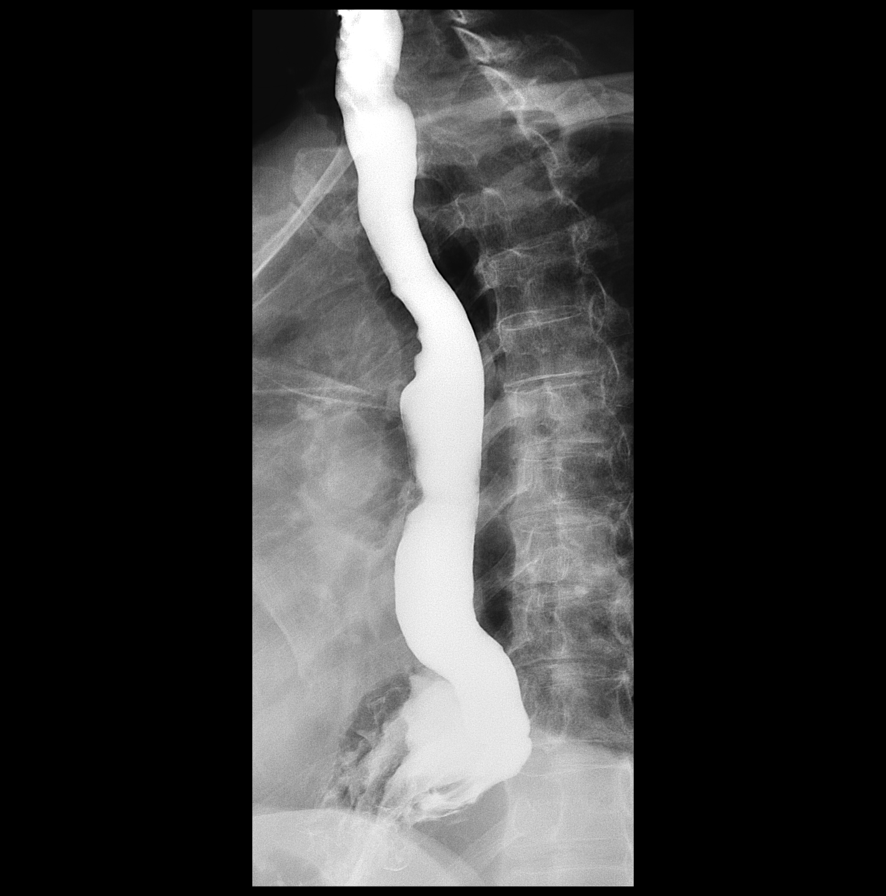

[Series 16: cp_standard · 0.25mm/px · 1 of 1 slices shown (7 of 7)]
[im 1/1]
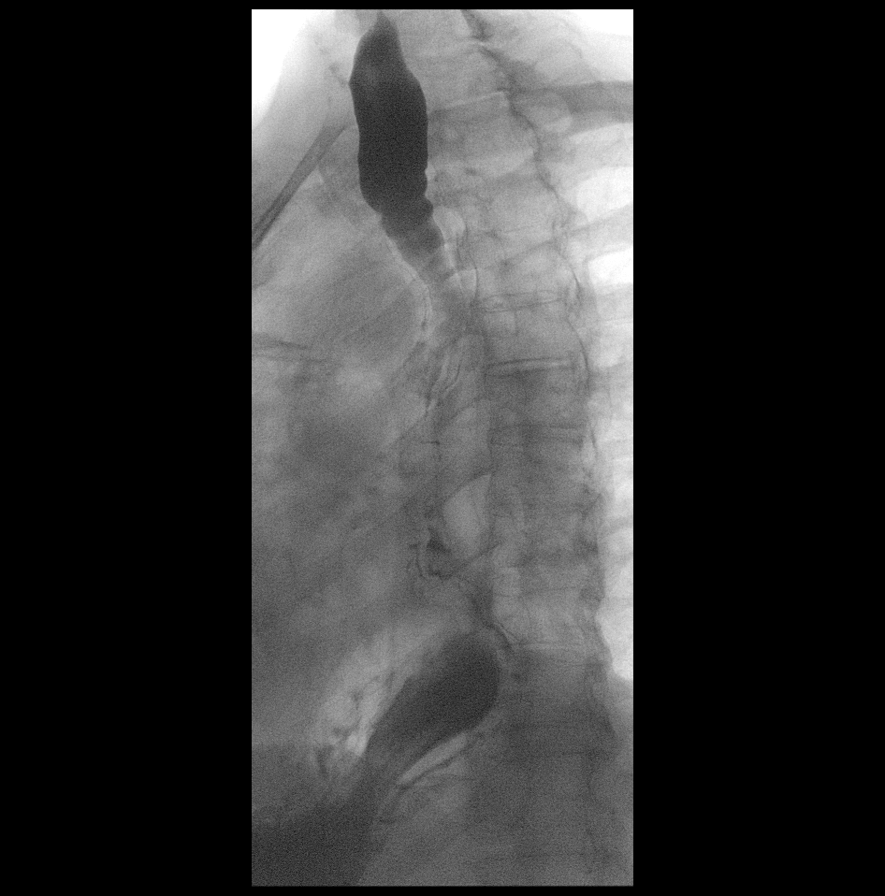

[14 of 24 positions shown; findings below may reference images not displayed]

FINDINGS: No evidence of aspiration.

Moderate esophageal dysmotility.

No fixed esophageal narrowing or stricture. This is best
demonstrated on prone swallows, when the esophagus is widely patent.

Moderate hiatal hernia.

While gastroesophageal reflux is suspected, this could not be
assessed due to the patient's inability to completely clear his
esophagus in the prone position.
IMPRESSION: Moderate esophageal dysmotility.

No fixed esophageal narrowing or stricture.

Moderate hiatal hernia.

## 2020-02-11 DIAGNOSIS — N401 Enlarged prostate with lower urinary tract symptoms: Secondary | ICD-10-CM | POA: Diagnosis not present

## 2020-02-11 DIAGNOSIS — R351 Nocturia: Secondary | ICD-10-CM | POA: Diagnosis not present

## 2020-03-02 DIAGNOSIS — M1711 Unilateral primary osteoarthritis, right knee: Secondary | ICD-10-CM | POA: Diagnosis not present

## 2020-04-04 DIAGNOSIS — M25571 Pain in right ankle and joints of right foot: Secondary | ICD-10-CM | POA: Diagnosis not present

## 2020-04-04 DIAGNOSIS — M1711 Unilateral primary osteoarthritis, right knee: Secondary | ICD-10-CM | POA: Diagnosis not present

## 2020-04-11 DIAGNOSIS — M1711 Unilateral primary osteoarthritis, right knee: Secondary | ICD-10-CM | POA: Diagnosis not present

## 2020-04-18 DIAGNOSIS — M1711 Unilateral primary osteoarthritis, right knee: Secondary | ICD-10-CM | POA: Diagnosis not present

## 2020-04-22 DIAGNOSIS — M25571 Pain in right ankle and joints of right foot: Secondary | ICD-10-CM | POA: Diagnosis not present

## 2020-06-16 DIAGNOSIS — M25612 Stiffness of left shoulder, not elsewhere classified: Secondary | ICD-10-CM | POA: Diagnosis not present

## 2020-06-16 DIAGNOSIS — M7542 Impingement syndrome of left shoulder: Secondary | ICD-10-CM | POA: Diagnosis not present

## 2020-06-16 DIAGNOSIS — S46112D Strain of muscle, fascia and tendon of long head of biceps, left arm, subsequent encounter: Secondary | ICD-10-CM | POA: Diagnosis not present

## 2020-06-16 DIAGNOSIS — M6281 Muscle weakness (generalized): Secondary | ICD-10-CM | POA: Diagnosis not present

## 2020-06-20 DIAGNOSIS — H401131 Primary open-angle glaucoma, bilateral, mild stage: Secondary | ICD-10-CM | POA: Diagnosis not present

## 2020-06-21 DIAGNOSIS — S46112D Strain of muscle, fascia and tendon of long head of biceps, left arm, subsequent encounter: Secondary | ICD-10-CM | POA: Diagnosis not present

## 2020-06-21 DIAGNOSIS — M7542 Impingement syndrome of left shoulder: Secondary | ICD-10-CM | POA: Diagnosis not present

## 2020-06-21 DIAGNOSIS — M6281 Muscle weakness (generalized): Secondary | ICD-10-CM | POA: Diagnosis not present

## 2020-06-21 DIAGNOSIS — M25612 Stiffness of left shoulder, not elsewhere classified: Secondary | ICD-10-CM | POA: Diagnosis not present

## 2020-06-23 DIAGNOSIS — M6281 Muscle weakness (generalized): Secondary | ICD-10-CM | POA: Diagnosis not present

## 2020-06-23 DIAGNOSIS — M25612 Stiffness of left shoulder, not elsewhere classified: Secondary | ICD-10-CM | POA: Diagnosis not present

## 2020-06-23 DIAGNOSIS — S46112D Strain of muscle, fascia and tendon of long head of biceps, left arm, subsequent encounter: Secondary | ICD-10-CM | POA: Diagnosis not present

## 2020-06-23 DIAGNOSIS — M7542 Impingement syndrome of left shoulder: Secondary | ICD-10-CM | POA: Diagnosis not present

## 2020-06-25 ENCOUNTER — Other Ambulatory Visit: Payer: Self-pay | Admitting: Internal Medicine

## 2020-06-27 ENCOUNTER — Telehealth: Payer: Self-pay

## 2020-06-27 NOTE — Telephone Encounter (Signed)
The pt called and left a message saying that he was supposed to have had an appt for tomorrow at 10 but that he got a call Friday that his appt was on the 28th.  I called the pt back and told the him  that I see that he does have an appt tomorrow but that its at 2 not 10.  The pt said that he will be able to make it to his appt because his therapy is in the morning.

## 2020-06-28 ENCOUNTER — Encounter: Payer: Self-pay | Admitting: Internal Medicine

## 2020-06-28 DIAGNOSIS — M6281 Muscle weakness (generalized): Secondary | ICD-10-CM | POA: Diagnosis not present

## 2020-06-28 DIAGNOSIS — S46112D Strain of muscle, fascia and tendon of long head of biceps, left arm, subsequent encounter: Secondary | ICD-10-CM | POA: Diagnosis not present

## 2020-06-28 DIAGNOSIS — M25612 Stiffness of left shoulder, not elsewhere classified: Secondary | ICD-10-CM | POA: Diagnosis not present

## 2020-06-28 DIAGNOSIS — M7542 Impingement syndrome of left shoulder: Secondary | ICD-10-CM | POA: Diagnosis not present

## 2020-06-30 DIAGNOSIS — S46112D Strain of muscle, fascia and tendon of long head of biceps, left arm, subsequent encounter: Secondary | ICD-10-CM | POA: Diagnosis not present

## 2020-06-30 DIAGNOSIS — M7542 Impingement syndrome of left shoulder: Secondary | ICD-10-CM | POA: Diagnosis not present

## 2020-06-30 DIAGNOSIS — M25612 Stiffness of left shoulder, not elsewhere classified: Secondary | ICD-10-CM | POA: Diagnosis not present

## 2020-06-30 DIAGNOSIS — M6281 Muscle weakness (generalized): Secondary | ICD-10-CM | POA: Diagnosis not present

## 2020-07-01 DIAGNOSIS — M25512 Pain in left shoulder: Secondary | ICD-10-CM | POA: Diagnosis not present

## 2020-07-06 ENCOUNTER — Other Ambulatory Visit: Payer: Self-pay

## 2020-07-06 MED ORDER — AMLODIPINE BESYLATE 5 MG PO TABS: ORAL_TABLET | ORAL | 2 refills | Status: DC

## 2020-07-10 NOTE — Progress Notes (Signed)
Pt not seen, he cancelled appt.

## 2020-08-15 ENCOUNTER — Other Ambulatory Visit: Payer: Self-pay | Admitting: Internal Medicine

## 2020-08-15 ENCOUNTER — Ambulatory Visit (INDEPENDENT_AMBULATORY_CARE_PROVIDER_SITE_OTHER): Payer: Medicare Other | Admitting: Internal Medicine

## 2020-08-15 ENCOUNTER — Encounter: Payer: Self-pay | Admitting: Internal Medicine

## 2020-08-15 ENCOUNTER — Other Ambulatory Visit: Payer: Self-pay

## 2020-08-15 VITALS — BP 124/70 | HR 74 | Temp 98.1°F | Ht 64.0 in | Wt 163.2 lb

## 2020-08-15 DIAGNOSIS — I1310 Hypertensive heart and chronic kidney disease without heart failure, with stage 1 through stage 4 chronic kidney disease, or unspecified chronic kidney disease: Secondary | ICD-10-CM

## 2020-08-15 DIAGNOSIS — Z79899 Other long term (current) drug therapy: Secondary | ICD-10-CM | POA: Diagnosis not present

## 2020-08-15 DIAGNOSIS — I701 Atherosclerosis of renal artery: Secondary | ICD-10-CM

## 2020-08-15 DIAGNOSIS — N183 Chronic kidney disease, stage 3 unspecified: Secondary | ICD-10-CM | POA: Diagnosis not present

## 2020-08-15 DIAGNOSIS — Z23 Encounter for immunization: Secondary | ICD-10-CM | POA: Diagnosis not present

## 2020-08-15 DIAGNOSIS — R718 Other abnormality of red blood cells: Secondary | ICD-10-CM

## 2020-08-15 DIAGNOSIS — M159 Polyosteoarthritis, unspecified: Secondary | ICD-10-CM

## 2020-08-15 DIAGNOSIS — I2510 Atherosclerotic heart disease of native coronary artery without angina pectoris: Secondary | ICD-10-CM

## 2020-08-15 DIAGNOSIS — E7800 Pure hypercholesterolemia, unspecified: Secondary | ICD-10-CM | POA: Diagnosis not present

## 2020-08-15 NOTE — Patient Instructions (Addendum)
GINGER/TURMERIC TEA    Hypertension, Adult High blood pressure (hypertension) is when the force of blood pumping through the arteries is too strong. The arteries are the blood vessels that carry blood from the heart throughout the body. Hypertension forces the heart to work harder to pump blood and may cause arteries to become narrow or stiff. Untreated or uncontrolled hypertension can cause a heart attack, heart failure, a stroke, kidney disease, and other problems. A blood pressure reading consists of a higher number over a lower number. Ideally, your blood pressure should be below 120/80. The first ("top") number is called the systolic pressure. It is a measure of the pressure in your arteries as your heart beats. The second ("bottom") number is called the diastolic pressure. It is a measure of the pressure in your arteries as the heart relaxes. What are the causes? The exact cause of this condition is not known. There are some conditions that result in or are related to high blood pressure. What increases the risk? Some risk factors for high blood pressure are under your control. The following factors may make you more likely to develop this condition:  Smoking.  Having type 2 diabetes mellitus, high cholesterol, or both.  Not getting enough exercise or physical activity.  Being overweight.  Having too much fat, sugar, calories, or salt (sodium) in your diet.  Drinking too much alcohol. Some risk factors for high blood pressure may be difficult or impossible to change. Some of these factors include:  Having chronic kidney disease.  Having a family history of high blood pressure.  Age. Risk increases with age.  Race. You may be at higher risk if you are African American.  Gender. Men are at higher risk than women before age 76. After age 38, women are at higher risk than men.  Having obstructive sleep apnea.  Stress. What are the signs or symptoms? High blood pressure may  not cause symptoms. Very high blood pressure (hypertensive crisis) may cause:  Headache.  Anxiety.  Shortness of breath.  Nosebleed.  Nausea and vomiting.  Vision changes.  Severe chest pain.  Seizures. How is this diagnosed? This condition is diagnosed by measuring your blood pressure while you are seated, with your arm resting on a flat surface, your legs uncrossed, and your feet flat on the floor. The cuff of the blood pressure monitor will be placed directly against the skin of your upper arm at the level of your heart. It should be measured at least twice using the same arm. Certain conditions can cause a difference in blood pressure between your right and left arms. Certain factors can cause blood pressure readings to be lower or higher than normal for a short period of time:  When your blood pressure is higher when you are in a health care provider's office than when you are at home, this is called white coat hypertension. Most people with this condition do not need medicines.  When your blood pressure is higher at home than when you are in a health care provider's office, this is called masked hypertension. Most people with this condition may need medicines to control blood pressure. If you have a high blood pressure reading during one visit or you have normal blood pressure with other risk factors, you may be asked to:  Return on a different day to have your blood pressure checked again.  Monitor your blood pressure at home for 1 week or longer. If you are diagnosed with hypertension, you  may have other blood or imaging tests to help your health care provider understand your overall risk for other conditions. How is this treated? This condition is treated by making healthy lifestyle changes, such as eating healthy foods, exercising more, and reducing your alcohol intake. Your health care provider may prescribe medicine if lifestyle changes are not enough to get your blood  pressure under control, and if:  Your systolic blood pressure is above 130.  Your diastolic blood pressure is above 80. Your personal target blood pressure may vary depending on your medical conditions, your age, and other factors. Follow these instructions at home: Eating and drinking   Eat a diet that is high in fiber and potassium, and low in sodium, added sugar, and fat. An example eating plan is called the DASH (Dietary Approaches to Stop Hypertension) diet. To eat this way: ? Eat plenty of fresh fruits and vegetables. Try to fill one half of your plate at each meal with fruits and vegetables. ? Eat whole grains, such as whole-wheat pasta, brown rice, or whole-grain bread. Fill about one fourth of your plate with whole grains. ? Eat or drink low-fat dairy products, such as skim milk or low-fat yogurt. ? Avoid fatty cuts of meat, processed or cured meats, and poultry with skin. Fill about one fourth of your plate with lean proteins, such as fish, chicken without skin, beans, eggs, or tofu. ? Avoid pre-made and processed foods. These tend to be higher in sodium, added sugar, and fat.  Reduce your daily sodium intake. Most people with hypertension should eat less than 1,500 mg of sodium a day.  Do not drink alcohol if: ? Your health care provider tells you not to drink. ? You are pregnant, may be pregnant, or are planning to become pregnant.  If you drink alcohol: ? Limit how much you use to:  0-1 drink a day for women.  0-2 drinks a day for men. ? Be aware of how much alcohol is in your drink. In the [place] one drink equals one 12 oz bottle of beer (355 mL), one 5 oz glass of wine (148 mL), or one 1 oz glass of hard liquor (44 mL). Lifestyle   Work with your health care provider to maintain a healthy body weight or to lose weight. Ask what an ideal weight is for you.  Get at least 30 minutes of exercise most days of the week. Activities may include walking, swimming, or  biking.  Include exercise to strengthen your muscles (resistance exercise), such as Pilates or lifting weights, as part of your weekly exercise routine. Try to do these types of exercises for 30 minutes at least 3 days a week.  Do not use any products that contain nicotine or tobacco, such as cigarettes, e-cigarettes, and chewing tobacco. If you need help quitting, ask your health care provider.  Monitor your blood pressure at home as told by your health care provider.  Keep all follow-up visits as told by your health care provider. This is important. Medicines  Take over-the-counter and prescription medicines only as told by your health care provider. Follow directions carefully. Blood pressure medicines must be taken as prescribed.  Do not skip doses of blood pressure medicine. Doing this puts you at risk for problems and can make the medicine less effective.  Ask your health care provider about side effects or reactions to medicines that you should watch for. Contact a health care provider if you:  Think you are having a  reaction to a medicine you are taking.  Have headaches that keep coming back (recurring).  Feel dizzy.  Have swelling in your ankles.  Have trouble with your vision. Get help right away if you:  Develop a severe headache or confusion.  Have unusual weakness or numbness.  Feel faint.  Have severe pain in your chest or abdomen.  Vomit repeatedly.  Have trouble breathing. Summary  Hypertension is when the force of blood pumping through your arteries is too strong. If this condition is not controlled, it may put you at risk for serious complications.  Your personal target blood pressure may vary depending on your medical conditions, your age, and other factors. For most people, a normal blood pressure is less than 120/80.  Hypertension is treated with lifestyle changes, medicines, or a combination of both. Lifestyle changes include losing weight, eating a  healthy, low-sodium diet, exercising more, and limiting alcohol. This information is not intended to replace advice given to you by your health care provider. Make sure you discuss any questions you have with your health care provider. Document Revised: 07/30/2018 Document Reviewed: 07/30/2018 Elsevier Patient Education  2020 [employer].

## 2020-08-15 NOTE — Progress Notes (Signed)
I,Katawbba Wiggins,acting as a Education administrator for [name], MD.,have documented all relevant documentation on the behalf of [name], MD,as directed by  [name], MD while in the presence of [name], MD.  This visit occurred during the SARS-CoV-2 public health emergency.  Safety protocols were in place, including screening questions prior to the visit, additional usage of staff PPE, and extensive cleaning of exam room while observing appropriate contact time as indicated for disinfecting solutions.  Subjective:     Patient ID: Wayne [name] , male    DOB: [birth year] , 86 y.o.   MRN: [id number]   Chief Complaint  Patient presents with  . Hypertension    HPI  The patient is here today for a blood pressure follow-up. He reports compliance with meds. Unfortunately, he is still bothered by severe OA. He reports awakening with stiffness/pain that doesn't get much better as the day progresses. He recently started taking CBD gummies. He hopes that this brings him some relief.   Hypertension This is a chronic problem. The current episode started more than 1 year ago. The problem has been gradually improving since onset. The problem is controlled. Pertinent negatives include no blurred vision, palpitations or shortness of breath. Past treatments include calcium channel blockers, angiotensin blockers, diuretics and lifestyle changes. The current treatment provides moderate improvement. Hypertensive end-organ damage includes kidney disease.     Past Medical History:  Diagnosis Date  . Arthritis   . Cataract   . Glaucoma   . Hypertension      Family History  Problem Relation Age of Onset  . Dementia Mother   . Cancer Father      Current Outpatient Medications:  .  amLODipine (NORVASC) 5 MG tablet, TAKE 1 TABLET(5 MG) BY MOUTH DAILY, Disp: 90 tablet, Rfl: 2 .  aspirin EC 81 MG tablet, Take 81 mg by mouth daily., Disp: , Rfl:  .  Cholecalciferol (VITAMIN D3) 125 MCG  (5000 UT) CAPS, Take by mouth daily. Monday, Wednesday, Friday, Disp: , Rfl:  .  famotidine (PEPCID) 40 MG tablet, , Disp: , Rfl:  .  fluocinonide cream (LIDEX) 4.40 %, Apply 1 application topically 2 (two) times daily., Disp: 30 g, Rfl: 1 .  ketoconazole (NIZORAL) 2 % cream, Apply 1 application topically daily., Disp: 15 g, Rfl: 1 .  loratadine (CLARITIN) 10 MG tablet, Take 1 tablet (10 mg total) by mouth daily., Disp: 90 tablet, Rfl: 2 .  Multiple Vitamin (MULTIVITAMIN WITH MINERALS) TABS tablet, Take 1 tablet by mouth daily., Disp: , Rfl:  .  NON FORMULARY, CBD gummies, Disp: , Rfl:  .  Omega-3 Fatty Acids (FISH OIL) 1200 MG CAPS, Take 1 capsule by mouth 2 (two) times daily., Disp: , Rfl:  .  oxyCODONE-acetaminophen (PERCOCET/ROXICET) 5-325 MG tablet, Take 1 tablet by mouth daily as needed for pain., Disp: , Rfl: 0 .  tamsulosin (FLOMAX) 0.4 MG CAPS capsule, , Disp: , Rfl:  .  timolol (BETIMOL) 0.5 % ophthalmic solution, Place 1 drop into both eyes daily. , Disp: , Rfl:  .  valsartan-hydrochlorothiazide (DIOVAN-HCT) 80-12.5 MG tablet, TAKE 1 TABLET BY MOUTH  DAILY, Disp: 90 tablet, Rfl: 3 .  B Complex-C (B-COMPLEX WITH VITAMIN C) tablet, Take 1 tablet by mouth daily. (Patient not taking: Reported on 06/27/2020), Disp: , Rfl:    Allergies  Allergen Reactions  . Cephalexin     Severe mouth and throat dryness.     Review of Systems  Constitutional: Negative.  Eyes: Negative for blurred vision.  Respiratory: Negative.  Negative for shortness of breath.   Cardiovascular: Negative.  Negative for palpitations.  Gastrointestinal: Negative.   Musculoskeletal: Positive for arthralgias.  Psychiatric/Behavioral: Negative.   All other systems reviewed and are negative.    Today's Vitals   08/15/20 1411  BP: 124/70  Pulse: 74  Temp: 98.1 F (36.7 C)  TempSrc: Oral  Weight: 163 lb 3.2 oz (74 kg)  Height: 5' 4" (1.626 m)  PainSc: 5   PainLoc: Leg   Body mass index is 28.01 kg/m.  Wt  Readings from Last 3 Encounters:  08/15/20 163 lb 3.2 oz (74 kg)  12/30/19 161 lb 3.2 oz (73.1 kg)  12/30/19 161 lb 3.2 oz (73.1 kg)   Objective:  Physical Exam Vitals and nursing note reviewed.  Constitutional:      Appearance: Normal appearance. He is obese.  HENT:     Head: Normocephalic and atraumatic.  Cardiovascular:     Rate and Rhythm: Normal rate and regular rhythm.     Heart sounds: Normal heart sounds.  Pulmonary:     Breath sounds: Normal breath sounds.  Musculoskeletal:        General: Deformity present.     Comments: He has joint deformities in hands c/w OA. Also with changes at MCP joints. Arthritis panel has been neg in the past.  Skin:    General: Skin is warm.  Neurological:     General: No focal deficit present.     Mental Status: He is alert and oriented to person, place, and time.         Assessment And Plan:     1. Hypertensive heart and renal disease with renal failure, stage 1 through stage 4 or unspecified chronic kidney disease, without heart failure Comments: Chronic, well controlled. He will continue with current meds. He is encouraged to avoid adding salt to his foods.  - BMP8+EGFR  2. Coronary artery disease involving native coronary artery of native heart without angina pectoris Comments: Chronic, yet stable. He is encouraged to follow a heart healthy diet.   3. Stage 3 chronic kidney disease, unspecified whether stage 3a or 3b CKD Comments: This has been stable.   4. Generalized osteoarthritis of multiple sites Comments: Chronic, advised to try ginger/turmeric tea daily. Will consider checking repeat autoimmune arthritis panel. He will c/w use of topical pain cream.   5. Elevated MCV Comments: I will check B12/folate levels today.   6. Pure hypercholesterolemia Comments: Chronic, last results reviewed in detail during his visit. He is encouraged to continue with current therapy.  - Lipid panel  7. Need for vaccination Comments: He  was given high dose flu vaccine.  - Flu Vaccine QUAD High Dose(Fluad)  8. Drug therapy - B12 and Folate Panel     Patient was given opportunity to ask questions. Patient verbalized understanding of the plan and was able to repeat key elements of the plan. All questions were answered to their satisfaction.  Robyn N Sanders, MD   I, Robyn N Sanders, MD, have reviewed all documentation for this visit. The documentation on 08/15/20 for the exam, diagnosis, procedures, and orders are all accurate and complete.  THE PATIENT IS ENCOURAGED TO PRACTICE SOCIAL DISTANCING DUE TO THE COVID-19 PANDEMIC.   

## 2020-08-16 LAB — BMP8+EGFR
BUN/Creatinine Ratio: 17 (ref 10–24)
BUN: 20 mg/dL (ref 10–36)
CO2: 24 mmol/L (ref 20–29)
Calcium: 9.3 mg/dL (ref 8.6–10.2)
Chloride: 106 mmol/L (ref 96–106)
Creatinine, Ser: 1.2 mg/dL (ref 0.76–1.27)
GFR calc Af Amer: 61 mL/min/{1.73_m2} (ref >59)
GFR calc non Af Amer: 53 mL/min/{1.73_m2} — ABNORMAL LOW (ref >59)
Glucose: 91 mg/dL (ref 65–99)
Potassium: 4.1 mmol/L (ref 3.5–5.2)
Sodium: 142 mmol/L (ref 134–144)

## 2020-08-16 LAB — B12 AND FOLATE PANEL
Folate: >20 ng/mL (ref >3.0)
Vitamin B-12: 1054 pg/mL (ref 232–1245)

## 2020-08-16 LAB — LIPID PANEL
Chol/HDL Ratio: 3.6 ratio (ref 0.0–5.0)
Cholesterol, Total: 179 mg/dL (ref 100–199)
HDL: 50 mg/dL (ref >39)
LDL Chol Calc (NIH): 113 mg/dL — ABNORMAL HIGH (ref 0–99)
Triglycerides: 84 mg/dL (ref 0–149)
VLDL Cholesterol Cal: 16 mg/dL (ref 5–40)

## 2020-08-22 ENCOUNTER — Telehealth: Payer: Self-pay | Admitting: *Deleted

## 2020-08-22 NOTE — Chronic Care Management (AMB) (Signed)
  Chronic Care Management   Outreach Note  08/22/2020 Name: Wayne [name] MRN: [id number] DOB: [birth year]  Wayne [name] is a 86 y.o. year old male who is a primary care patient of Glendale Chard, MD. I reached out to [name] by phone today in response to a referral sent by [name]'s health plan.     An unsuccessful telephone outreach was attempted today. The patient was referred to the case management team for assistance with care management and care coordination.   Follow Up Plan: A HIPAA compliant phone message was left for the patient providing contact information and requesting a return call. The care management team will reach out to the patient again over the next 7 days. If patient returns call to provider office, please advise to call [place] at [phone].  [facility]

## 2020-08-22 NOTE — Chronic Care Management (AMB) (Signed)
  Chronic Care Management   Note  08/22/2020 Name: Wayne [name] MRN: [id number] DOB: 1926-03-30  ESPEN BETHEL is a 86 y.o. year old male who is a primary care patient of Glendale Chard, MD. I reached out to [name] by phone today in response to a referral sent by [name]'s health plan.     [name] was given information about Chronic Care Management services today including:  1. CCM service includes personalized support from designated clinical staff supervised by his physician, including individualized plan of care and coordination with other care providers 2. 24/7 contact phone numbers for assistance for urgent and routine care needs. 3. Service will only be billed when office clinical staff spend 20 minutes or more in a month to coordinate care. 4. Only one practitioner may furnish and bill the service in a calendar month. 5. The patient may stop CCM services at any time (effective at the end of the month) by phone call to the office staff. 6. The patient will be responsible for cost sharing (co-pay) of up to 20% of the service fee (after annual deductible is met).  Patient agreed to services and verbal consent obtained.   Follow up plan: Telephone appointment with care management team member scheduled for: 09/06/2020  [facility]

## 2020-09-02 ENCOUNTER — Telehealth: Payer: Self-pay

## 2020-09-02 NOTE — Chronic Care Management (AMB) (Signed)
Chronic Care Management Pharmacy Assistant   Name: GWYNN CROSSLEY  MRN: [id number] DOB: [birth year]  Reason for Encounter: Medication Review / Initial Questions for Pharmacist visit on 09/06/2020.  Have you seen any other providers since your last visit? No  Any changes in your medications or health? No  Any side effects from any medications? No  Do you have an symptoms or problems not managed by your medications? Arthritis . Patient states that he does have Oxycodone for pain , but he only takes when he has to.  Any concerns about your health right now? Patient states he has  Arthritis,and a  bad right ankle. Patient states that he is unable to have surgery because he lives by himself.  Has your provider asked that you check blood pressure, blood sugar, or follow special diet at home? Patient states he does not have a blood pressure cuff.  Do you get any type of exercise on a regular basis? Patient states he did go to [facility] , five days a week , until his right foot got to hurting, states he does see Orthopedic . Patient states he does do weights for his arms, he does keep as active as he can, he takes care of home, meals and yard.  Can you think of a goal you would like to reach for your health? Patient states he would like to have better pain management.  Do you have any problems getting your medications? No.Patient gets all medications delivered except for his amlodipine.  Is there anything that you would like to discuss during the appointment?  Patient would like to discuss getting a medical letter,so he can  have the postal service deliver his mail at his home address, states he is  having trouble going to end of lane to pick up.  Patient is aware to bring all  medications and supplements to appointment.      PCP : Glendale Chard, MD  Allergies:   Allergies  Allergen Reactions   Cephalexin     Severe mouth and throat dryness.    Medications: Outpatient Encounter  Medications as of 09/02/2020  Medication Sig   amLODipine (NORVASC) 5 MG tablet TAKE 1 TABLET(5 MG) BY MOUTH DAILY   aspirin EC 81 MG tablet Take 81 mg by mouth daily.   B Complex-C (B-COMPLEX WITH VITAMIN C) tablet Take 1 tablet by mouth daily. (Patient not taking: Reported on 06/27/2020)   Cholecalciferol (VITAMIN D3) 125 MCG (5000 UT) CAPS Take by mouth daily. Monday, Wednesday, Friday   famotidine (PEPCID) 40 MG tablet    fluocinonide cream (LIDEX) 6.07 % Apply 1 application topically 2 (two) times daily.   ketoconazole (NIZORAL) 2 % cream Apply 1 application topically daily.   loratadine (CLARITIN) 10 MG tablet Take 1 tablet (10 mg total) by mouth daily.   Multiple Vitamin (MULTIVITAMIN WITH MINERALS) TABS tablet Take 1 tablet by mouth daily.   NON FORMULARY CBD gummies   Omega-3 Fatty Acids (FISH OIL) 1200 MG CAPS Take 1 capsule by mouth 2 (two) times daily.   oxyCODONE-acetaminophen (PERCOCET/ROXICET) 5-325 MG tablet Take 1 tablet by mouth daily as needed for pain.   tamsulosin (FLOMAX) 0.4 MG CAPS capsule    timolol (BETIMOL) 0.5 % ophthalmic solution Place 1 drop into both eyes daily.    valsartan-hydrochlorothiazide (DIOVAN-HCT) 80-12.5 MG tablet TAKE 1 TABLET BY MOUTH  DAILY   No facility-administered encounter medications on file as of 09/02/2020.    Current Diagnosis: Patient Active Problem  List   Diagnosis Date Noted   Renal artery atherosclerosis ([place]) 12/30/2019   Viral upper respiratory tract infection 03/09/2019   Hearing loss due to cerumen impaction, bilateral 12/04/2018   Acute back pain 06/10/2018   Spondylosis of lumbar region without myelopathy or radiculopathy 06/10/2018   Carpal tunnel syndrome of left wrist 12/12/2017   [place] 07/01/2016   Anemia of chronic renal failure, stage 3 (moderate) ([place]) 11/23/2015   Dehydration 11/22/2015   CKD (chronic kidney disease) stage 3, GFR 30-59 ml/min ([facility]) 11/22/2015   Sepsis ([place]) 11/22/2015     Diarrhea 11/22/2015   Leukopenia 11/22/2015   Benign essential HTN 11/22/2015   Coronary artery disease involving native coronary artery without angina pectoris 03/18/2012   Hyperlipidemia 03/18/2012     Follow-Up:  Pharmacist Review   Jannette Fogo, CPP  Judithann Sheen, [facility] Clinical Pharmacist Assistant [phone]

## 2020-09-06 ENCOUNTER — Ambulatory Visit: Payer: Medicare Other

## 2020-09-06 ENCOUNTER — Encounter: Payer: Self-pay | Admitting: Internal Medicine

## 2020-09-06 ENCOUNTER — Other Ambulatory Visit: Payer: Self-pay

## 2020-09-06 DIAGNOSIS — I1310 Hypertensive heart and chronic kidney disease without heart failure, with stage 1 through stage 4 chronic kidney disease, or unspecified chronic kidney disease: Secondary | ICD-10-CM

## 2020-09-06 DIAGNOSIS — E7800 Pure hypercholesterolemia, unspecified: Secondary | ICD-10-CM

## 2020-09-06 DIAGNOSIS — M159 Polyosteoarthritis, unspecified: Secondary | ICD-10-CM

## 2020-09-06 NOTE — Chronic Care Management (AMB) (Signed)
Chronic Care Management Pharmacy  Name: Wayne [name]  MRN: [id number] DOB: 05-23-1926  Chief Complaint/ HPI  [name],  86 y.o. , male presents for their Initial CCM visit with the clinical pharmacist In office. Patient reports that arthritis is his worst condition and that the only time he is not in pain is when he is asleep.   PCP : Glendale Chard, MD  Their chronic conditions include: Hypertension, Hyperlipidemia, Chronic Kidney Disease and Osteoarthritis  Office Visits: 08/15/20 OV: HTN follow up. Pt still bothered by severe OA. HTN well controlled. Avoid adding salt to foods. CAD chronic, yet stable. Encouraged to follow heart healthy diet. Kidney function stable/improved. Advised to try ginger/turmeric tea daily for osteoarthritis. Will consider repeat autoimmune arthritis panel (has been negative in the past). Continue with topical pain cream. Administered high dose flu vaccine. LDL is 113, goal less than 100. B12 and folate levels are normal. Pt does not drink alcohol.   12/30/19 AWV and OV: HTN well controlled. Keep BP controlled to prevent progression of CKD. CAD chronic, yet stable. Renal artery atherosclerosis s/p renal stent. Continue topical agents for osteoarthritis. Pt does not wish to take oral agents which could worsen kidney function. Stay well hydrated and keep ice chips next to bed. Continue to use Biotene for dry mouth. Advised to cut back on dosing of loratadine. BMI stable.   Consult Visits: 04/04/20, 04/11/20, 04/18/20, 04/22/20 Orthopedic surgery OV: Xrays, drain large joint/bursa, gel injections (foot/ankle)  CCM Encounters:  Medications: Outpatient Encounter Medications as of 09/06/2020  Medication Sig  . amLODipine (NORVASC) 5 MG tablet TAKE 1 TABLET(5 MG) BY MOUTH DAILY  . aspirin EC 81 MG tablet Take 81 mg by mouth daily.  . Cholecalciferol (VITAMIN D3) 125 MCG (5000 UT) CAPS Take by mouth daily. Monday, Wednesday, Friday  . famotidine (PEPCID) 20 MG  tablet Take 2 tablets by mouth 2 (two) times daily.   . Multiple Vitamin (MULTIVITAMIN WITH MINERALS) TABS tablet Take 1 tablet by mouth daily.  . Omega-3 Fatty Acids (FISH OIL) 1200 MG CAPS Take 1 capsule by mouth daily.   Marland Kitchen oxyCODONE-acetaminophen (PERCOCET/ROXICET) 5-325 MG tablet Take 1 tablet by mouth daily as needed for pain.  . Red Yeast Rice Extract (RED YEAST RICE PO) Take 2 capsules by mouth daily.  . Saw Palmetto 450 MG CAPS Take 2 capsules by mouth daily.  . tamsulosin (FLOMAX) 0.4 MG CAPS capsule Take 0.4 mg by mouth daily.   . timolol (BETIMOL) 0.5 % ophthalmic solution Place 1 drop into both eyes daily.   . valsartan-hydrochlorothiazide (DIOVAN-HCT) 80-12.5 MG tablet TAKE 1 TABLET BY MOUTH  DAILY  . B Complex-C (B-COMPLEX WITH VITAMIN C) tablet Take 1 tablet by mouth daily. (Patient not taking: Reported on 06/27/2020)  . famotidine (PEPCID) 40 MG tablet   . fluocinonide cream (LIDEX) 6.38 % Apply 1 application topically 2 (two) times daily. (Patient not taking: Reported on 09/06/2020)  . ketoconazole (NIZORAL) 2 % cream Apply 1 application topically daily. (Patient not taking: Reported on 09/06/2020)  . loratadine (CLARITIN) 10 MG tablet Take 1 tablet (10 mg total) by mouth daily.  . NON FORMULARY CBD gummies (Patient not taking: Reported on 09/06/2020)   No facility-administered encounter medications on file as of 09/06/2020.    Current Diagnosis/Assessment:  SDOH Interventions     Most Recent Value  SDOH Interventions  Financial Strain Interventions Intervention Not Indicated      Goals Addressed  This Visit's Progress   . Pharmacy Care Plan       CARE PLAN ENTRY (see longitudinal plan of care for additional care plan information)  Current Barriers:  . Chronic Disease Management support, education, and care coordination needs related to Hypertension, Hyperlipidemia, and Osteoarthritis   Hypertension BP Readings from Last 3 Encounters:  08/15/20 124/70   12/30/19 112/66  12/30/19 112/66   . Pharmacist Clinical Goal(s): o Over the next 180 days, patient will work with PharmD and providers to maintain BP goal <130/80 . Current regimen:  o Amlodipine 56m daily o Valsartan/HCTZ 80-12.566mdaily . Interventions: o Provided dietary and exercise recommendations o Recommend patient obtain home blood pressure monitor if he begins to feel symptomatic (headaches, dizziness, lightheadedness, etc) o Advised patient to he could have blood pressure checked at local pharmacy if needed . Patient self care activities - Over the next 180 days, patient will: o Check BP if symptomatic, document, and provide at future appointments o Ensure daily salt intake < 2300 mg/day o Exercise as able with a goal of 30 minutes 5 times weekly (150 minutes per week total)  Hyperlipidemia Lab Results  Component Value Date/Time   LDLCALC 113 (H) 08/15/2020 02:51 PM   . Pharmacist Clinical Goal(s): o Over the next 180 days, patient will work with PharmD and providers to achieve LDL goal < 100 . Current regimen:  o Omega-3 fatty acids 120014mnce daily o Red yeast rice 2 capsules at bedtime . Interventions: o Provided dietary and exercise recommendations o Discussed appropriate goal for LDL (less than 100) . Patient self care activities - Over the next 180 days, patient will: o Exercise as able with a goal of 30 minutes 5 times weekly (150 minutes per week total) o Focus on heart healthy, well-balanced diet (diet recommendations included below)  Osteoarthritis . Pharmacist Clinical Goal(s) o Over the next 90 days, patient will work with PharmD and providers to manage symptoms of arthritis and pain . Current regimen:  o Oxycodone/APAP 5/325m48m2 tablets daily as needed for pain . Interventions: o Discussed previous treatments patient has tried for arthritis pain  o Recommend Voltaren gel over the counter to be applied to joints topically for pain o Recommend  scheduling Tylenol 500mg20mhelp with pain - Discussed that Tylenol does not cause increased risk of bleeding like NSAIDs (Advil, Motrin, Aleve) o Collaborate with PCP to have letter signed for medical necessity for patient to have mailbox moved closer to his house rather than at the end of his road - Patient will be notified when letter is completed and ready to be picked up from the office . Patient self care activities - Over the next 90 days, patient will: o Try using Voltaren gel 1% (available over the counter without a prescription) using 2 grams applied to affected joints up to four times daily as needed o Try scheduled Tylenol 500mg 63my. Take 2 tablets in the morning and 2 tablets in the evening daily  Medication management . Pharmacist Clinical Goal(s): o Over the next 180 days, patient will work with PharmD and providers to maintain optimal medication adherence . [facility] . Interventions o Comprehensive medication review performed. o Continue current medication management strategy . Patient self care activities - Over the next 180 days, patient will: o Focus on medication adherence by considering use of a pill box o Take medications as prescribed o Report any questions or concerns to PharmD and/or provider(s)  Initial goal  documentation        Hyperlipidemia   LDL goal < 100  Lipid Panel     Component Value Date/Time   CHOL 179 08/15/2020 1451   TRIG 84 08/15/2020 1451   HDL 50 08/15/2020 1451   LDLCALC 113 (H) 08/15/2020 1451    Hepatic Function Latest Ref Rng & Units 12/30/2019 12/29/2018 08/10/2017  Total Protein 6.0 - 8.5 g/dL 7.2 6.8 7.2  Albumin 3.5 - 4.6 g/dL 4.2 4.1 3.6  AST 0 - 40 IU/L '23 18 31  ' ALT 0 - 44 IU/L '14 14 18  ' Alk Phosphatase 39 - 117 IU/L 108 102 70  Total Bilirubin 0.0 - 1.2 mg/dL 0.2 0.4 0.7     The ASCVD Risk score (Morrill., et al., 2013) failed to calculate for the following reasons:   The 2013 ASCVD  risk score is only valid for ages 7 to 7   Patient has failed these meds in past: Pravastatin Patient is currently uncontrolled on the following medications:  . Omega-3 fatty acids 1212m once daily . Red yeast rice 2 capsules at bedtime  We discussed:   . Pt unsure of strength of Omega-3 and red yeast rice and unfortunately did not bing these medication with him to office visit . Diet extensively o Pt states that his diet is fine o He reports eating pinto beans, greens, corn, etc o He states that he eats very little meat o He usually cooks once a week and makes enough to last the entire week - His wife used to do the cooking o He does not go out to eat much now (just on special occasions) . Exercise extensively o Pt was going to the [employer] 5 days a week until his arthritis got bad and now he is no longer able to go (stopped over a year ago, before COVID) o Pt states that he uses resistance bands and dumb bells 3 days per week for about 15-20 minutes each time o Recommend pt get 30 minutes of moderate intensity exercise daily 5 times a week (150 minutes total per week) o Pt states he is going to try and start doing exercises daily o Pt does not do much walking due to arthritis . LDL goal less than 100  Plan Continue current medications and control with diet and exercise   Hypertension   BP goal is:  <130/80  Office blood pressures are  BP Readings from Last 3 Encounters:  08/15/20 124/70  12/30/19 112/66  12/30/19 112/66   Patient checks BP at home never Patient home BP readings are ranging:   Patient has failed these meds in the past: Olmesartan/HCTZ, losartan, hydralazine, bisoprolol/HCTZ, atenolol Patient is currently controlled on the following medications:  . Amlodipine 526mdaily . Valsartan/HCTZ 80-12.69m84maily  We discussed: . Pt reports that he does not have a BP monitor at home . Denies headaches, dizziness . Diet and exercise extensively o Pt adds very  little salt to his food . Recommend that pt obtain BP monitor for home use if he begins feeling symptomatic (headaches, dizziness, lightheadedness, etc) . Advised pt he could go to local pharmacy to have BP checked  Plan Continue current medications   Vitamin D Deficiency   Vit D, 25-Hydroxy  Date Value Ref Range Status  06/30/2019 37.5 30.0 - 100.0 ng/mL Final    Comment:    Vitamin D deficiency has been defined by the Institute of Medicine and an Endocrine Society practice guideline as a  level of serum 25-OH vitamin D less than 20 ng/mL (1,2). The Endocrine Society went on to further define vitamin D insufficiency as a level between 21 and 29 ng/mL (2). 1. IOM (Institute of Medicine). 2010. Dietary reference    intakes for calcium and D. [place]: The    [employer]. 2. Holick MF, Binkley Gisela, Bischoff-Ferrari HA, et al.    Evaluation, treatment, and prevention of vitamin D    deficiency: an Endocrine Society clinical practice    guideline. JCEM. 2011 Jul; 96(7):1911-30.     Patient has failed these meds in past:  Patient is currently controlled on the following medications:  . Cholecalciferol 5000 units daily three times weekly (Monday, Wednesday, and Friday)  We discussed:  Recommend weight-bearing and muscle strengthening exercises for building and maintaining bone density.   Pt was advised to take Vitamin D three times weekly due to kidney function per PCP in 2020  Plan Continue current medications  Repeat Vitamin D level at follow up PCP visit  BPH   Patient has failed these meds in past: N/A Patient is currently controlled on the following medications:   Tamsulosin 0.28m capsule daily  Plan Continue current medications  Glaucoma   Patient has failed these meds in past: N/A Patient is currently controlled on the following medications:   Timolol maleate 0.5% 1 drop in both eyes in the morning  Plan Continue current medications  GERD    Patient has failed these meds in past: Omeprazole Patient is currently controlled on the following medications:   Famotidine 273m2 tablets twice daily  We discussed:    Pt states he was put on famotidine by Dr. HuBenson [place] his throat was closing and he was having trouble with food getting stuck/choking  He states he is to continue famotidine for 1 year and then will follow up with Dr. HuBenson NorwayPt states omeprazole made him itch  Plan Continue current medications  Osteoarthritis   Patient has failed these meds in past: Diclofenac, tramadol, meloxicam Patient is currently uncontrolled on the following medications:   Oxycodone/APAP 5/3254m-2 tablet daily as needed for pain  We discussed:    Pt reports he has most recently been using [employer] cream and sublingual tablets for arthritis pain, but has not noticed an improvement  Discussed various treatments pt has tried for arthritis pain  CBD gummies and oil- no benefit noticed within 1 month of using  Capsaicin cream- burned too much  Pt takes oxycodone/APAP as needed for pain (sometimes daily, but usually every 3-4 days)  Recommend Voltaren gel topically for joint pain  Discussed that Voltaren helps with pain by reducing inflammation  Recommend scheduled Tylenol 2-3 times daily  Pt was concerned about bleeding  Advised him that bleeding risk is minimal with Tylenol (would be a concern with NSAIDs)  Discussed not using too much Tylenol since also taking oxycodone/APAP  Pt mentioned that he needs a letter signed by Dr. SanBaird Cancer provide to the post master in order to get his mailbox moved from the end of his driveway (down a hill) to his house  Collaborate with PCP to have letter typed and signed by Dr. SanBaird Cancerating pt needs mailbox moved due to severe osteoarthritis  Plan Continue current medications  Try Voltaren gel 1% over the counter (2 grams applied to affected joints up to 4 times daily) Start  scheduled Tylenol 500m28mtablets in the morning and 2 tablet in the evening  Health Maintenance  Patient is currently on the following medications:  . Aspirin 60m daily (Equate) . Loratadine 136mdaily . Multivitamin 50+ daily (Equate) . Saw palmetto 45051m capsules daily ([facility] discussed:   . Pt's aspirin expired in January 2021 o Advised pt that expired medications begin to lose potency and may not work as well (he only has a few tablets left) . Pt is taking an allergy medication but cannot recall the name and did not bring with his other medications to appointment today  Plan Continue current medications   Vaccines   Reviewed and discussed patient's vaccination history.    Immunization History  Administered Date(s) Administered  . Fluad Quad(high Dose 65+) 08/07/2019, 08/15/2020  . Influenza, High Dose Seasonal PF 09/06/2017  . Influenza-Unspecified 08/04/2018  . Moderna SARS-COVID-2 Vaccination 12/27/2019, 01/24/2020  . Pneumococcal Conjugate-13 08/27/2019  . Pneumococcal-Unspecified 06/18/2011  . Zoster Recombinat (Shingrix) 08/27/2019  2nd dose of Shingrix received: 10/26/19  We discussed:  Pt up to date on immunizations with the exception of tetanus  Pt does not recall last tetanus shot  No record of tetanus in Epic or [facility] electronic medical record for history of tetanus vaccines Recommended patient receive Tdap vaccine in office if he has not received in the last 10 years  Medication Management   Pt uses [facility] for all medications Uses pill box? No - He has been taking his medications for years and states that he has a routine Pt endorses 100% compliance  We discussed:  . Importance of taking each medications daily as directed . Benefits os using a pill box . [facility] . Adherence packaging and  delivery available with [facility]  Plan Continue current medication management strategy  Follow up: 3 month phone visit  CouJannette FogoharmD Clinical Pharmacist [facility] [phone]

## 2020-09-06 NOTE — Patient Instructions (Addendum)
Visit Information  Goals Addressed            This Visit's Progress   . Pharmacy Care Plan       CARE PLAN ENTRY (see longitudinal plan of care for additional care plan information)  Current Barriers:  . Chronic Disease Management support, education, and care coordination needs related to Hypertension, Hyperlipidemia, and Osteoarthritis   Hypertension BP Readings from Last 3 Encounters:  08/15/20 124/70  12/30/19 112/66  12/30/19 112/66   . Pharmacist Clinical Goal(s): o Over the next 180 days, patient will work with PharmD and providers to maintain BP goal <130/80 . Current regimen:  o Amlodipine 5mg  daily o Valsartan/HCTZ 80-12.5mg  daily . Interventions: o Provided dietary and exercise recommendations o Recommend patient obtain home blood pressure monitor if he begins to feel symptomatic (headaches, dizziness, lightheadedness, etc) o Advised patient to he could have blood pressure checked at local pharmacy if needed . Patient self care activities - Over the next 180 days, patient will: o Check BP if symptomatic, document, and provide at future appointments o Ensure daily salt intake < 2300 mg/day o Exercise as able with a goal of 30 minutes 5 times weekly (150 minutes per week total)  Hyperlipidemia Lab Results  Component Value Date/Time   LDLCALC 113 (H) 08/15/2020 02:51 PM   . Pharmacist Clinical Goal(s): o Over the next 180 days, patient will work with PharmD and providers to achieve LDL goal < 100 . Current regimen:  o Omega-3 fatty acids 1200mg  once daily o Red yeast rice 2 capsules at bedtime . Interventions: o Provided dietary and exercise recommendations o Discussed appropriate goal for LDL (less than 100) . Patient self care activities - Over the next 180 days, patient will: o Exercise as able with a goal of 30 minutes 5 times weekly (150 minutes per week total) o Focus on heart healthy, well-balanced diet (diet recommendations included  below)  Osteoarthritis . Pharmacist Clinical Goal(s) o Over the next 90 days, patient will work with PharmD and providers to manage symptoms of arthritis and pain . Current regimen:  o Oxycodone/APAP 5/325mg  1-2 tablets daily as needed for pain . Interventions: o Discussed previous treatments patient has tried for arthritis pain  o Recommend Voltaren gel over the counter to be applied to joints topically for pain o Recommend scheduling Tylenol 500mg  to help with pain - Discussed that Tylenol does not cause increased risk of bleeding like NSAIDs (Advil, Motrin, Aleve) o Collaborate with PCP to have letter signed for medical necessity for patient to have mailbox moved closer to his house rather than at the end of his road - Patient will be notified when letter is completed and ready to be picked up from the office . Patient self care activities - Over the next 90 days, patient will: o Try using Voltaren gel 1% (available over the counter without a prescription) using 2 grams applied to affected joints up to four times daily as needed o Try scheduled Tylenol 500mg  daily. Take 2 tablets in the morning and 2 tablets in the evening daily  Medication management . Pharmacist Clinical Goal(s): o Over the next 180 days, patient will work with PharmD and providers to maintain optimal medication adherence . Current pharmacy: [facility] pharmacy . Interventions o Comprehensive medication review performed. o Continue current medication management strategy . Patient self care activities - Over the next 180 days, patient will: o Focus on medication adherence by considering use of a pill box o Take medications  as prescribed o Report any questions or concerns to PharmD and/or provider(s)  Initial goal documentation        Wayne [name] was given information about Chronic Care Management services today including:  1. CCM service includes personalized support from designated clinical staff supervised  by his physician, including individualized plan of care and coordination with other care providers 2. 24/7 contact phone numbers for assistance for urgent and routine care needs. 3. Standard insurance, coinsurance, copays and deductibles apply for chronic care management only during months in which we provide at least 20 minutes of these services. Most insurances cover these services at 100%, however patients may be responsible for any copay, coinsurance and/or deductible if applicable. This service may help you avoid the need for more expensive face-to-face services. 4. Only one practitioner may furnish and bill the service in a calendar month. 5. The patient may stop CCM services at any time (effective at the end of the month) by phone call to the office staff.  Patient agreed to services and verbal consent obtained.   The patient verbalized understanding of instructions provided today and agreed to receive a mailed copy of patient instruction and/or educational materials. Telephone follow up appointment with pharmacy team member scheduled for: 12/07/2020 @ 3:30 PM  Jannette Fogo, PharmD Clinical Pharmacist [facility] [phone]   Fat and Cholesterol Restricted Eating Plan Eating a diet that limits fat and cholesterol may help lower your risk for heart disease and other conditions. Your body needs fat and cholesterol for basic functions, but eating too much of these things can be harmful to your health. Your health care provider may order lab tests to check your blood fat (lipid) and cholesterol levels. This helps your health care provider understand your risk for certain conditions and whether you need to make diet changes. Work with your health care provider or dietitian to make an eating plan that is right for you. Your plan includes:  Limit your fat intake to ______% or less of your total calories a day.  Limit your saturated fat intake to ______% or less of  your total calories a day.  Limit the amount of cholesterol in your diet to less than _________mg a day.  Eat ___________ g of fiber a day. What are tips for following this plan? General guidelines   If you are overweight, work with your health care provider to lose weight safely. Losing just 5-10% of your body weight can improve your overall health and help prevent diseases such as diabetes and heart disease.  Avoid: ? Foods with added sugar. ? Fried foods. ? Foods that contain partially hydrogenated oils, including stick margarine, some tub margarines, cookies, crackers, and other baked goods.  Limit alcohol intake to no more than 1 drink a day for nonpregnant women and 2 drinks a day for men. One drink equals 12 oz of beer, 5 oz of wine, or 1 oz of hard liquor. Reading food labels  Check food labels for: ? Trans fats, partially hydrogenated oils, or high amounts of saturated fat. Avoid foods that contain saturated fat and trans fat. ? The amount of cholesterol in each serving. Try to eat no more than 200 mg of cholesterol each day. ? The amount of fiber in each serving. Try to eat at least 20-30 g of fiber each day.  Choose foods with healthy fats, such as: ? Monounsaturated and polyunsaturated fats. These include olive and canola oil, flaxseeds, walnuts, almonds, and seeds. ? Omega-3  fats. These are found in foods such as salmon, mackerel, sardines, tuna, flaxseed oil, and ground flaxseeds.  Choose grain products that have whole grains. Look for the word "whole" as the first word in the ingredient list. Cooking  Cook foods using methods other than frying. Baking, boiling, grilling, and broiling are some healthy options.  Eat more home-cooked food and less restaurant, buffet, and fast food.  Avoid cooking using saturated fats. ? Animal sources of saturated fats include meats, butter, and cream. ? Plant sources of saturated fats include palm oil, palm kernel oil, and coconut  oil. Meal planning   At meals, imagine dividing your plate into fourths: ? Fill one-half of your plate with vegetables and green salads. ? Fill one-fourth of your plate with whole grains. ? Fill one-fourth of your plate with lean protein foods.  Eat fish that is high in omega-3 fats at least two times a week.  Eat more foods that contain fiber, such as whole grains, beans, apples, broccoli, carrots, peas, and barley. These foods help promote healthy cholesterol levels in the blood. Recommended foods Grains  Whole grains, such as whole wheat or whole grain breads, crackers, cereals, and pasta. Unsweetened oatmeal, bulgur, barley, quinoa, or brown rice. Corn or whole wheat flour tortillas. Vegetables  Fresh or frozen vegetables (raw, steamed, roasted, or grilled). Green salads. Fruits  All fresh, canned (in natural juice), or frozen fruits. Meats and other protein foods  Ground beef (85% or leaner), grass-fed beef, or beef trimmed of fat. Skinless chicken or [place]. Ground chicken or [place]. Pork trimmed of fat. All fish and seafood. Egg whites. Dried beans, peas, or lentils. Unsalted nuts or seeds. Unsalted canned beans. Natural nut butters without added sugar and oil. Dairy  Low-fat or nonfat dairy products, such as skim or 1% milk, 2% or reduced-fat cheeses, low-fat and fat-free ricotta or cottage cheese, or plain low-fat and nonfat yogurt. Fats and oils  Tub margarine without trans fats. Light or reduced-fat mayonnaise and salad dressings. Avocado. Olive, canola, sesame, or safflower oils. The items listed above may not be a complete list of recommended foods or beverages. Contact your dietitian for more options. Foods to avoid Grains  White bread. White pasta. White rice. Cornbread. Bagels, pastries, and croissants. Crackers and snack foods that contain trans fat and hydrogenated oils. Vegetables  Vegetables cooked in cheese, cream, or butter sauce. Fried  vegetables. Fruits  Canned fruit in heavy syrup. Fruit in cream or butter sauce. Fried fruit. Meats and other protein foods  Fatty cuts of meat. Ribs, chicken wings, bacon, sausage, bologna, salami, chitterlings, fatback, hot dogs, bratwurst, and packaged lunch meats. Liver and organ meats. Whole eggs and egg yolks. Chicken and [place] with skin. Fried meat. Dairy  Whole or 2% milk, cream, half-and-half, and cream cheese. Whole milk cheeses. Whole-fat or sweetened yogurt. Full-fat cheeses. Nondairy creamers and whipped toppings. Processed cheese, cheese spreads, and cheese curds. Beverages  Alcohol. Sugar-sweetened drinks such as sodas, lemonade, and fruit drinks. Fats and oils  Butter, stick margarine, lard, shortening, ghee, or bacon fat. Coconut, palm kernel, and palm oils. Sweets and desserts  Corn syrup, sugars, honey, and molasses. Candy. Jam and jelly. Syrup. Sweetened cereals. Cookies, pies, cakes, donuts, muffins, and ice cream. The items listed above may not be a complete list of foods and beverages to avoid. Contact your dietitian for more information. Summary  Your body needs fat and cholesterol for basic functions. However, eating too much of these things can be harmful  to your health.  Work with your health care provider and dietitian to follow a diet low in fat and cholesterol. Doing this may help lower your risk for heart disease and other conditions.  Choose healthy fats, such as monounsaturated and polyunsaturated fats, and foods high in omega-3 fatty acids.  Eat fiber-rich foods, such as whole grains, beans, peas, fruits, and vegetables.  Limit or avoid alcohol, fried foods, and foods high in saturated fats, partially hydrogenated oils, and sugar. This information is not intended to replace advice given to you by your health care provider. Make sure you discuss any questions you have with your health care provider. Document Revised: 11/01/2017 Document Reviewed:  08/06/2017 Elsevier Patient Education  [place].

## 2020-09-28 ENCOUNTER — Other Ambulatory Visit: Payer: Self-pay

## 2020-09-28 MED ORDER — AMLODIPINE BESYLATE 5 MG PO TABS
ORAL_TABLET | ORAL | 2 refills | Status: DC
Start: 2020-09-28 — End: 2020-09-30

## 2020-09-30 ENCOUNTER — Other Ambulatory Visit: Payer: Self-pay

## 2020-09-30 ENCOUNTER — Ambulatory Visit (INDEPENDENT_AMBULATORY_CARE_PROVIDER_SITE_OTHER): Payer: Medicare Other

## 2020-09-30 DIAGNOSIS — Z23 Encounter for immunization: Secondary | ICD-10-CM | POA: Diagnosis not present

## 2020-09-30 MED ORDER — AMLODIPINE BESYLATE 5 MG PO TABS
ORAL_TABLET | ORAL | 2 refills | Status: DC
Start: 2020-09-30 — End: 2021-05-26

## 2020-09-30 NOTE — Progress Notes (Signed)
   Covid-19 Vaccination Clinic  Name:  JESE COMELLA    MRN: [id number] DOB: 01-18-1926  09/30/2020  Mr. Hugill was observed post Covid-19 immunization for 15 minutes without incident. He was provided with Vaccine Information Sheet and instruction to access the V-Safe system.   Mr. Grandison was instructed to call 911 with any severe reactions post vaccine: Marland Kitchen Difficulty breathing  . Swelling of face and throat  . A fast heartbeat  . A bad rash all over body  . Dizziness and weakness

## 2020-10-31 DIAGNOSIS — M19071 Primary osteoarthritis, right ankle and foot: Secondary | ICD-10-CM | POA: Diagnosis not present

## 2020-10-31 DIAGNOSIS — M1711 Unilateral primary osteoarthritis, right knee: Secondary | ICD-10-CM | POA: Diagnosis not present

## 2020-11-07 DIAGNOSIS — M1711 Unilateral primary osteoarthritis, right knee: Secondary | ICD-10-CM | POA: Diagnosis not present

## 2020-11-14 ENCOUNTER — Telehealth: Payer: Self-pay

## 2020-11-14 NOTE — Chronic Care Management (AMB) (Signed)
Chronic Care Management Pharmacy Assistant   Name: Wayne [name]  MRN: [id number] DOB: 03-07-1926  Reason for Encounter: Disease State - Hypertension and Lipids Adherence Call.   Patient Questions:  1.  Have you seen any other providers since your last visit? No   2.  Any changes in your medicines or health? No      PCP : Glendale Chard, MD  Allergies:   Allergies  Allergen Reactions  . Cephalexin     Severe mouth and throat dryness.    Medications: Outpatient Encounter Medications as of 11/14/2020  Medication Sig  . amLODipine (NORVASC) 5 MG tablet TAKE 1 TABLET(5 MG) BY MOUTH DAILY  . aspirin EC 81 MG tablet Take 81 mg by mouth daily.  . B Complex-C (B-COMPLEX WITH VITAMIN C) tablet Take 1 tablet by mouth daily. (Patient not taking: Reported on 06/27/2020)  . Cholecalciferol (VITAMIN D3) 125 MCG (5000 UT) CAPS Take by mouth daily. Monday, Wednesday, Friday  . famotidine (PEPCID) 20 MG tablet Take 2 tablets by mouth 2 (two) times daily.   . famotidine (PEPCID) 40 MG tablet   . fluocinonide cream (LIDEX) 4.09 % Apply 1 application topically 2 (two) times daily. (Patient not taking: Reported on 09/06/2020)  . ketoconazole (NIZORAL) 2 % cream Apply 1 application topically daily. (Patient not taking: Reported on 09/06/2020)  . loratadine (CLARITIN) 10 MG tablet Take 1 tablet (10 mg total) by mouth daily.  . Multiple Vitamin (MULTIVITAMIN WITH MINERALS) TABS tablet Take 1 tablet by mouth daily.  . NON FORMULARY CBD gummies (Patient not taking: Reported on 09/06/2020)  . Omega-3 Fatty Acids (FISH OIL) 1200 MG CAPS Take 1 capsule by mouth daily.   Marland Kitchen oxyCODONE-acetaminophen (PERCOCET/ROXICET) 5-325 MG tablet Take 1-2 tablets by mouth daily as needed for pain.   . Red Yeast Rice Extract (RED YEAST RICE PO) Take 2 capsules by mouth daily.  . Saw Palmetto 450 MG CAPS Take 2 capsules by mouth daily.  . tamsulosin (FLOMAX) 0.4 MG CAPS capsule Take 0.4 mg by mouth daily.   . timolol  (BETIMOL) 0.5 % ophthalmic solution Place 1 drop into both eyes daily.   . valsartan-hydrochlorothiazide (DIOVAN-HCT) 80-12.5 MG tablet TAKE 1 TABLET BY MOUTH  DAILY   No facility-administered encounter medications on file as of 11/14/2020.    Current Diagnosis: Patient Active Problem List   Diagnosis Date Noted  . Renal artery atherosclerosis ([place]) 12/30/2019  . Viral upper respiratory tract infection 03/09/2019  . Hearing loss due to cerumen impaction, bilateral 12/04/2018  . Acute back pain 06/10/2018  . Spondylosis of lumbar region without myelopathy or radiculopathy 06/10/2018  . Carpal tunnel syndrome of left wrist 12/12/2017  . Hamm[place]oe 07/01/2016  . Anemia of chronic renal failure, stage 3 (moderate) ([place]) 11/23/2015  . Dehydration 11/22/2015  . CKD (chronic kidney disease) stage 3, GFR 30-59 ml/min ([facility]) 11/22/2015  . Sepsis ([place]) 11/22/2015  . Diarrhea 11/22/2015  . Leukopenia 11/22/2015  . Benign essential HTN 11/22/2015  . Coronary artery disease involving native coronary artery without angina pectoris 03/18/2012  . Hyperlipidemia 03/18/2012   Reviewed chart prior to disease state call. Spoke with patient regarding BP  Recent Office Vitals: BP Readings from Last 3 Encounters:  08/15/20 124/70  12/30/19 112/66  12/30/19 112/66   Pulse Readings from Last 3 Encounters:  08/15/20 74  12/30/19 67  12/30/19 67    Wt Readings from Last 3 Encounters:  08/15/20 163 lb 3.2 oz (74 kg)  12/30/19 161  lb 3.2 oz (73.1 kg)  12/30/19 161 lb 3.2 oz (73.1 kg)     Kidney Function Lab Results  Component Value Date/Time   CREATININE 1.20 08/15/2020 02:51 PM   CREATININE 1.26 12/30/2019 03:38 PM   GFRNONAA 53 (L) 08/15/2020 02:51 PM   GFRAA 61 08/15/2020 02:51 PM    BMP Latest Ref Rng & Units 08/15/2020 12/30/2019 06/30/2019  Glucose 65 - 99 mg/dL 91 100(H) 138(H)  BUN 10 - 36 mg/dL 20 20 26   Creatinine 0.76 - 1.27 mg/dL 1.20 1.26 1.28(H)  BUN/Creat Ratio 10 - 24 17 16 20    Sodium 134 - 144 mmol/L 142 142 144  Potassium 3.5 - 5.2 mmol/L 4.1 4.4 4.2  Chloride 96 - 106 mmol/L 106 104 104  CO2 20 - 29 mmol/L 24 26 24   Calcium 8.6 - 10.2 mg/dL 9.3 9.4 9.8    . Current antihypertensive regimen:  o Amlodipine 5 mg daily o Valsartan/HCTZ 80-12.5 mg daily . How often are you checking your Blood Pressure?   Patient does not check blood pressure currently he does not have a machine.  . Current home BP readings:    None.  . What recent interventions/DTPs have been made by any provider to improve Blood Pressure control since last CPP Visit: Patient is taking his medications as directed per provider.  . Any recent hospitalizations or ED visits since last visit with CPP? No  . What diet changes have been made to improve Blood Pressure Control?  o Patient stated he is watching his salt intake and eating healthy.  . What exercise is being done to improve your Blood Pressure Control?  None. Patient stated his problems with his knees and feet and arthritis.  Adherence Review: Is the patient currently on ACE/ARB medication? Yes.  Does the patient have >5 day gap between last estimated fill dates? No.    Comprehensive medication review performed; Spoke to patient regarding cholesterol  Lipid Panel    Component Value Date/Time   CHOL 179 08/15/2020 1451   TRIG 84 08/15/2020 1451   HDL 50 08/15/2020 1451   LDLCALC 113 (H) 08/15/2020 1451    10-year ASCVD risk score: The ASCVD Risk score Mikey Bussing DC Jr., et al., 2013) failed to calculate for the following reasons:   The 2013 ASCVD risk score is only valid for ages 2 to 48  . Current antihyperlipidemic regimen:  o Omega-3 fatty acids 1200 mg once daily o Red yeast rice 2 capsules at bedtime  . Previous antihyperlipidemic medications tried: Pravastatin , Simvastatin.  Marland Kitchen ASCVD risk enhancing conditions: age >68 and HTN . What recent interventions/DTPs have been made by any provider to improve Cholesterol  control since last CPP Visit:   Patient is taking medication as directed per provider.  . Any recent hospitalizations or ED visits since last visit with CPP?  No   . What diet changes have been made to improve Cholesterol?  o Patient stated he does watch his salt intake and he is eating healthy.  . What exercise is being done to improve Cholesterol?  Patient stated he has  problems with his knees and feet and arthritis.   Adherence Review: Does the patient have >5 day gap between last estimated fill dates? No       Goals Addressed            This Visit's Progress   . Pharmacy Care Plan   Not on track    [place] (see longitudinal plan  of care for additional care plan information)  Current Barriers:  . Chronic Disease Management support, education, and care coordination needs related to Hypertension, Hyperlipidemia, and Osteoarthritis   Hypertension BP Readings from Last 3 Encounters:  08/15/20 124/70  12/30/19 112/66  12/30/19 112/66   . Pharmacist Clinical Goal(s): o Over the next 180 days, patient will work with PharmD and providers to maintain BP goal <130/80 . Current regimen:  o Amlodipine 5mg  daily o Valsartan/HCTZ 80-12.5mg  daily . Interventions: o Provided dietary and exercise recommendations o Recommend patient obtain home blood pressure monitor if he begins to feel symptomatic (headaches, dizziness, lightheadedness, etc) o Advised patient to he could have blood pressure checked at local pharmacy if needed . Patient self care activities - Over the next 180 days, patient will: o Check BP if symptomatic, document, and provide at future appointments o Ensure daily salt intake < 2300 mg/day o Exercise as able with a goal of 30 minutes 5 times weekly (150 minutes per week total)  Hyperlipidemia Lab Results  Component Value Date/Time   LDLCALC 113 (H) 08/15/2020 02:51 PM   . Pharmacist Clinical Goal(s): o Over the next 180 days, patient will work  with PharmD and providers to achieve LDL goal < 100 . Current regimen:  o Omega-3 fatty acids 1200mg  once daily o Red yeast rice 2 capsules at bedtime . Interventions: o Provided dietary and exercise recommendations o Discussed appropriate goal for LDL (less than 100) . Patient self care activities - Over the next 180 days, patient will: o Exercise as able with a goal of 30 minutes 5 times weekly (150 minutes per week total) o Focus on heart healthy, well-balanced diet (diet recommendations included below)  Osteoarthritis . Pharmacist Clinical Goal(s) o Over the next 90 days, patient will work with PharmD and providers to manage symptoms of arthritis and pain . Current regimen:  o Oxycodone/APAP 5/325mg  1-2 tablets daily as needed for pain . Interventions: o Discussed previous treatments patient has tried for arthritis pain  o Recommend Voltaren gel over the counter to be applied to joints topically for pain o Recommend scheduling Tylenol 500mg  to help with pain - Discussed that Tylenol does not cause increased risk of bleeding like NSAIDs (Advil, Motrin, Aleve) o Collaborate with PCP to have letter signed for medical necessity for patient to have mailbox moved closer to his house rather than at the end of his road - Patient will be notified when letter is completed and ready to be picked up from the office . Patient self care activities - Over the next 90 days, patient will: o Try using Voltaren gel 1% (available over the counter without a prescription) using 2 grams applied to affected joints up to four times daily as needed o Try scheduled Tylenol 500mg  daily. Take 2 tablets in the morning and 2 tablets in the evening daily  Medication management . Pharmacist Clinical Goal(s): o Over the next 180 days, patient will work with PharmD and providers to maintain optimal medication adherence . Current pharmacy: [facility] pharmacy . Interventions o Comprehensive medication review  performed. o Continue current medication management strategy . Patient self care activities - Over the next 180 days, patient will: o Focus on medication adherence by considering use of a pill box o Take medications as prescribed o Report any questions or concerns to PharmD and/or provider(s)  Initial goal documentation        Follow-Up:  Pharmacist Review  Patient is currently not taking his blood pressure  Patient is currently not excising due to problems with his knees and feet and arthritis.   Orlando Penner  Judithann Sheen, [facility] Clinical Pharmacist Assistant [phone]  .

## 2020-11-16 DIAGNOSIS — M1711 Unilateral primary osteoarthritis, right knee: Secondary | ICD-10-CM | POA: Diagnosis not present

## 2020-11-29 ENCOUNTER — Telehealth: Payer: Self-pay

## 2020-11-29 NOTE — Chronic Care Management (AMB) (Signed)
    Chronic Care Management Pharmacy Assistant   Name: CASSADY TURANO  MRN: [id number] DOB: December 28, 1925  Reason for Encounter: Medication Review    PCP : Glendale Chard, MD  Allergies:   Allergies  Allergen Reactions  . Cephalexin     Severe mouth and throat dryness.    Medications: Outpatient Encounter Medications as of 11/29/2020  Medication Sig  . amLODipine (NORVASC) 5 MG tablet TAKE 1 TABLET(5 MG) BY MOUTH DAILY  . aspirin EC 81 MG tablet Take 81 mg by mouth daily.  . B Complex-C (B-COMPLEX WITH VITAMIN C) tablet Take 1 tablet by mouth daily. (Patient not taking: Reported on 06/27/2020)  . Cholecalciferol (VITAMIN D3) 125 MCG (5000 UT) CAPS Take by mouth daily. Monday, Wednesday, Friday  . famotidine (PEPCID) 20 MG tablet Take 2 tablets by mouth 2 (two) times daily.   . famotidine (PEPCID) 40 MG tablet   . fluocinonide cream (LIDEX) 6.19 % Apply 1 application topically 2 (two) times daily. (Patient not taking: Reported on 09/06/2020)  . ketoconazole (NIZORAL) 2 % cream Apply 1 application topically daily. (Patient not taking: Reported on 09/06/2020)  . loratadine (CLARITIN) 10 MG tablet Take 1 tablet (10 mg total) by mouth daily.  . Multiple Vitamin (MULTIVITAMIN WITH MINERALS) TABS tablet Take 1 tablet by mouth daily.  . NON FORMULARY CBD gummies (Patient not taking: Reported on 09/06/2020)  . Omega-3 Fatty Acids (FISH OIL) 1200 MG CAPS Take 1 capsule by mouth daily.   Marland Kitchen oxyCODONE-acetaminophen (PERCOCET/ROXICET) 5-325 MG tablet Take 1-2 tablets by mouth daily as needed for pain.   . Red Yeast Rice Extract (RED YEAST RICE PO) Take 2 capsules by mouth daily.  . Saw Palmetto 450 MG CAPS Take 2 capsules by mouth daily.  . tamsulosin (FLOMAX) 0.4 MG CAPS capsule Take 0.4 mg by mouth daily.   . timolol (BETIMOL) 0.5 % ophthalmic solution Place 1 drop into both eyes daily.   . valsartan-hydrochlorothiazide (DIOVAN-HCT) 80-12.5 MG tablet TAKE 1 TABLET BY MOUTH  DAILY   No  facility-administered encounter medications on file as of 11/29/2020.    Current Diagnosis: Patient Active Problem List   Diagnosis Date Noted  . Renal artery atherosclerosis ([place]) 12/30/2019  . Viral upper respiratory tract infection 03/09/2019  . Hearing loss due to cerumen impaction, bilateral 12/04/2018  . Acute back pain 06/10/2018  . Spondylosis of lumbar region without myelopathy or radiculopathy 06/10/2018  . Carpal tunnel syndrome of left wrist 12/12/2017  . Hammertoe 07/01/2016  . Anemia of chronic renal failure, stage 3 (moderate) ([place]) 11/23/2015  . Dehydration 11/22/2015  . CKD (chronic kidney disease) stage 3, GFR 30-59 ml/min ([facility]) 11/22/2015  . Sepsis ([place]) 11/22/2015  . Diarrhea 11/22/2015  . Leukopenia 11/22/2015  . Benign essential HTN 11/22/2015  . Coronary artery disease involving native coronary artery without angina pectoris 03/18/2012  . Hyperlipidemia 03/18/2012      Follow-Up:  Pharmacist Review - Reviewed chart and adherence measures.. Per insurance data total gaps - all measures (0) / Total gaps -star measures are (0) .   Vallie Pearson,CPP Notified  Judithann Sheen, [facility] Clinical Pharmacist Assistant [phone]

## 2020-12-06 ENCOUNTER — Telehealth: Payer: Self-pay

## 2020-12-06 NOTE — Progress Notes (Signed)
12/06/2020- Called and spoke with the patient to remind him of upcoming CCM follow-up appointment 12/07/20 at 3:30 pm with Orlando Penner, CPP through Telephone. Patient informed to have all medications and supplements with him during phone call. Patient verbalized understanding.  Notified CPP, Orlando Penner.  Raynelle Highland, [place] Pharmacist Assistant [phone]

## 2020-12-07 ENCOUNTER — Ambulatory Visit: Payer: Medicare Other

## 2020-12-07 DIAGNOSIS — N183 Chronic kidney disease, stage 3 unspecified: Secondary | ICD-10-CM

## 2020-12-07 DIAGNOSIS — E7800 Pure hypercholesterolemia, unspecified: Secondary | ICD-10-CM

## 2020-12-07 DIAGNOSIS — I1310 Hypertensive heart and chronic kidney disease without heart failure, with stage 1 through stage 4 chronic kidney disease, or unspecified chronic kidney disease: Secondary | ICD-10-CM

## 2020-12-07 NOTE — Chronic Care Management (AMB) (Signed)
Chronic Care Management Pharmacy  Name: AHMARI DUERSON  MRN: [id number] DOB: 11-24-1926  Chief Complaint/ HPI  [name],  87 y.o. , male presents for their Follow-Up CCM visit with the clinical pharmacist via telephone due to COVID-19 Pandemic. Patient reports that his pain starts with arthritis when he wakes up in the morning. He reports that he is in pain with arthritis all day long. The only time he is not in pain is when he is sleeping.  His great grandmother was great with herbs and she was able to cure anything with different herbs. He wishes she was living now to help with his arthritis. Patient worked at [employer] and he fell on black ice when he was working on second shift and had a back operation. He came out on disability in 32. 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 He enjoyed being a part of the army because it taught him a lot. He went to [place] for his tour. He was trained in [place], [place] and it was so hot that he could not stand on the sand with out shoes. He wound up in [place] where some people had never seen Black people. He reports that he was treated well over there and they took care of him. He was only 87 years old when he went there. 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 He was over the motor pool. He still mows his yard but has a neighbor that does the edging and trimming.   PCP : Glendale Chard, MD  Their chronic conditions include: Hypertension, Hyperlipidemia, Chronic Kidney Disease and Osteoarthritis  Office Visits: 08/15/20 OV: HTN follow up. Pt still bothered by severe OA. HTN well controlled. Avoid adding salt to foods. CAD chronic, yet stable. Encouraged to follow heart healthy diet. Kidney function stable/improved. Advised to try ginger/turmeric tea daily for osteoarthritis. Will consider repeat autoimmune arthritis panel (has been negative in the past). Continue with topical pain cream. Administered high dose  flu vaccine. LDL is 113, goal less than 100. B12 and folate levels are normal. Pt does not drink alcohol.   12/30/19 AWV and OV: HTN well controlled. Keep BP controlled to prevent progression of CKD. CAD chronic, yet stable. Renal artery atherosclerosis s/p renal stent. Continue topical agents for osteoarthritis. Pt does not wish to take oral agents which could worsen kidney function. Stay well hydrated and keep ice chips next to bed. Continue to use Biotene for dry mouth. Advised to cut back on dosing of loratadine. BMI stable.   Consult Visits: 04/04/20, 04/11/20, 04/18/20, 04/22/20 Orthopedic surgery OV: Xrays, drain large joint/bursa, gel injections (foot/ankle)  CCM Encounters:  Medications: Outpatient Encounter Medications as of 12/07/2020  Medication Sig  . amLODipine (NORVASC) 5 MG tablet TAKE 1 TABLET(5 MG) BY MOUTH DAILY  . aspirin EC 81 MG tablet Take 81 mg by mouth daily.  . B Complex-C (B-COMPLEX WITH VITAMIN C) tablet Take 1 tablet by mouth daily. (Patient not taking: Reported on 06/27/2020)  . Cholecalciferol (VITAMIN D3) 125 MCG (5000 UT) CAPS Take by mouth daily. Monday, Wednesday, Friday  . famotidine (PEPCID) 20 MG tablet Take 2 tablets by mouth 2 (two) times daily.   . famotidine (PEPCID) 40 MG tablet   . fluocinonide cream (LIDEX) 7.51 % Apply 1 application topically 2 (two) times daily. (Patient not taking: Reported on 09/06/2020)  . ketoconazole (NIZORAL) 2 % cream Apply 1 application topically daily. (Patient not  taking: Reported on 09/06/2020)  . loratadine (CLARITIN) 10 MG tablet Take 1 tablet (10 mg total) by mouth daily.  . Multiple Vitamin (MULTIVITAMIN WITH MINERALS) TABS tablet Take 1 tablet by mouth daily.  . NON FORMULARY CBD gummies (Patient not taking: Reported on 09/06/2020)  . Omega-3 Fatty Acids (FISH OIL) 1200 MG CAPS Take 1 capsule by mouth daily.   Marland Kitchen oxyCODONE-acetaminophen (PERCOCET/ROXICET) 5-325 MG tablet Take 1-2 tablets by mouth daily as needed for pain.   .  Red Yeast Rice Extract (RED YEAST RICE PO) Take 2 capsules by mouth daily.  . Saw Palmetto 450 MG CAPS Take 2 capsules by mouth daily.  . tamsulosin (FLOMAX) 0.4 MG CAPS capsule Take 0.4 mg by mouth daily.   . timolol (BETIMOL) 0.5 % ophthalmic solution Place 1 drop into both eyes daily.   . valsartan-hydrochlorothiazide (DIOVAN-HCT) 80-12.5 MG tablet TAKE 1 TABLET BY MOUTH  DAILY   No facility-administered encounter medications on file as of 12/07/2020.    Current Diagnosis/Assessment: Social Determinants of Health with Concerns   Tobacco Use: Medium Risk  . Smoking Tobacco Use: Former Smoker  . Smokeless Tobacco Use: Never Used  Physical Activity: Insufficiently Active  . Days of Exercise per Week: 3 days  . Minutes of Exercise per Session: 30 min  Social Connections: Not on file  Intimate Partner Violence: Not on file  Alcohol Screen: Not on file  Housing: Not on file       Goals Addressed            This Visit's Progress   . Pharmacy Care Plan       CARE PLAN ENTRY (see longitudinal plan of care for additional care plan information)  Current Barriers:  . Chronic Disease Management support, education, and care coordination needs related to Hypertension, Hyperlipidemia, and Osteoarthritis   Hypertension BP Readings from Last 3 Encounters:  08/15/20 124/70  12/30/19 112/66  12/30/19 112/66   . Pharmacist Clinical Goal(s): o Over the next 180 days, patient will work with PharmD and providers to maintain BP goal <130/80 . Current regimen:  o Amlodipine 39m daily o Valsartan/HCTZ 80-12.536mdaily . Interventions: o Provided dietary and exercise recommendations o Recommend patient obtain home blood pressure monitor if he begins to feel symptomatic (headaches, dizziness, lightheadedness, etc) o Advised patient to he could have blood pressure checked at local pharmacy if needed . Patient self care activities - Over the next 180 days, patient will: o Check BP if  symptomatic, document, and provide at future appointments o Ensure daily salt intake < 2300 mg/day o Exercise as able with a goal of 30 minutes 5 times weekly (150 minutes per week total)  Hyperlipidemia Lab Results  Component Value Date/Time   LDLCALC 113 (H) 08/15/2020 02:51 PM   . Pharmacist Clinical Goal(s): o Over the next 180 days, patient will work with PharmD and providers to achieve LDL goal < 100 . Current regimen:  o Omega-3 fatty acids 120030mnce daily o Red yeast rice 2 capsules at bedtime . Interventions: o Provided dietary and exercise recommendations o Discussed appropriate goal for LDL (less than 100) . Patient self care activities - Over the next 180 days, patient will: o Exercise as able with a goal of 30 minutes 5 times weekly (150 minutes per week total) o Focus on heart healthy, well-balanced diet (diet recommendations included below)  Osteoarthritis . Pharmacist Clinical Goal(s) o Over the next 90 days, patient will work with PharmD and providers to manage symptoms  of arthritis and pain . Current regimen:  o Oxycodone/APAP 5/354m 1-2 tablets daily as needed for pain . Interventions: o Discussed previous treatments patient has tried for arthritis pain  o Recommend Voltaren gel over the counter to be applied to joints topically for pain o Recommend scheduling Tylenol 5042mto help with pain . Patient self care activities - Over the next 90 days, patient will: o Try using Voltaren gel 1% (available over the counter without a prescription) using 2 grams applied to affected joints up to four times daily as needed o Try scheduled Tylenol 50054maily. Take 2 tablets in the morning and 2 tablets in the evening daily  Medication management . Pharmacist Clinical Goal(s): o Over the next 180 days, patient will work with PharmD and providers to maintain optimal medication adherence . [facility] . Interventions o Comprehensive medication  review performed. o Continue current medication management strategy . Patient self care activities - Over the next 180 days, patient will: o Focus on medication adherence by considering use of a pill box o Take medications as prescribed o Report any questions or concerns to PharmD and/or provider(s)  Please see past updates related to this goal by clicking on the "Past Updates" button in the selected goal         Hyperlipidemia   LDL goal < 100  Lipid Panel     Component Value Date/Time   CHOL 179 08/15/2020 1451   TRIG 84 08/15/2020 1451   HDL 50 08/15/2020 1451   LDLCALC 113 (H) 08/15/2020 1451    Hepatic Function Latest Ref Rng & Units 12/30/2019 12/29/2018 08/10/2017  Total Protein 6.0 - 8.5 g/dL 7.2 6.8 7.2  Albumin 3.5 - 4.6 g/dL 4.2 4.1 3.6  AST 0 - 40 IU/L _0 ALT 0 - 44 IU/L _1 Alk Phosphatase 39 - 117 IU/L 108 102 70  Total Bilirubin 0.0 - 1.2 mg/dL 0.2 0.4 0.7     The ASCVD Risk score (GofPaisleyet al., 2013) failed to calculate for the following reasons:   The 2013 ASCVD risk score is only valid for ages 40 25 79 26Patient has failed these meds in past: Pravastatin Patient is currently uncontrolled on the following medications:  . Omega-3 fatty acids 1200m79mce daily . Red yeast rice 2 capsules at bedtime  We discussed:   . Pt unsure of strength of Omega-3 and red yeast rice and unfortunately did not bing these medication with him to office visit . Diet extensively o Pt states that his diet is fine o He reports eating pinto beans, greens, corn, etc o He states that he eats very little meat o He usually cooks once a week and makes enough to last the entire week - His wife used to do the cooking o He does not go out to eat much now (just on special occasions) o He does not use salt in his food  . Exercise extensively o Pt states that he uses resistance bands and dumb bells 3 days per week for about 15-20 minutes each time o Pt states he is  going to try and start doing exercises daily o Pt does not do much walking due to arthritis . LDL goal less than 100  Plan Continue current medications and control with diet and exercise   Hypertension   BP goal is:  <130/80  Office blood pressures are  BP Readings from Last 3 Encounters:  08/15/20 124/70  12/30/19 112/66  12/30/19 112/66   Patient checks BP at home never Patient home BP readings are ranging:   Patient has failed these meds in the past: Olmesartan/HCTZ, losartan, hydralazine, bisoprolol/HCTZ, atenolol Patient is currently controlled on the following medications:  . Amlodipine 23m daily . Valsartan/HCTZ 80-12.542mdaily  We discussed: . Pt reports that he does not have a BP monitor at home . Denies headaches, dizziness . Diet and exercise extensively o Pt adds very little salt to his food . Recommend that pt obtain BP monitor for home use if he begins feeling symptomatic (headaches, dizziness, lightheadedness, etc)  Plan Continue current medications   Vitamin D Deficiency   Vit D, 25-Hydroxy  Date Value Ref Range Status  06/30/2019 37.5 30.0 - 100.0 ng/mL Final    Comment:    Vitamin D deficiency has been defined by the [place] guideline as a level of serum 25-OH vitamin D less than 20 ng/mL (1,2). The Endocrine Society went on to further define vitamin D insufficiency as a level between 21 and 29 ng/mL (2). 1. IOM (Institute of Medicine). 2010. Dietary reference    intakes for calcium and D. [place]    [employer]2. Holick MF, Binkley Hurricane, Bischoff-Ferrari HA, et al.    Evaluation, treatment, and prevention of vitamin D    deficiency: an Endocrine Society clinical practice    guideline. JCEM. 2011 Jul; 96(7):1911-30.     Patient has failed these meds in past:  Patient is currently controlled on the following medications:  . Cholecalciferol 5000 units daily three times  weekly (Monday, Wednesday, and Friday)  We discussed:  Recommend weight-bearing and muscle strengthening exercises for building and maintaining bone density.   Pt was advised to take Vitamin D three times weekly due to kidney function per PCP in 2020  Plan Continue current medications  Repeat Vitamin D level at follow up PCP visit  Chronic Kidney Disease Stage 3a   Lab Results  Component Value Date   CREATININE 1.20 08/15/2020   BUN 20 08/15/2020   GFRNONAA 53 (L) 08/15/2020   GFRAA 61 08/15/2020   NA 142 08/15/2020   K 4.1 08/15/2020   CALCIUM 9.3 08/15/2020   CO2 24 08/15/2020    Patient has failed these meds in past: None noted Patient is currently controlled on the following medications:  . Valsartan -HCTZ 80-12.5 MG- take daily   We discussed:    Patient takes his medication daily   Plan  Continue current medications.    BPH   Patient has failed these meds in past: N/A Patient is currently controlled on the following medications:   Tamsulosin 0.94m10mapsule daily  Plan Continue current medications  Glaucoma   Patient has failed these meds in past: N/A Patient is currently controlled on the following medications:   Timolol maleate 0.5% 1 drop in both eyes in the morning  Plan Continue current medications  GERD   Patient has failed these meds in past: Omeprazole Patient is currently controlled on the following medications:   Famotidine 69m51mtablets twice daily   We discussed:    Pt states he was put on famotidine by Dr. HungBenson [place] his throat was closing and he was having trouble with food getting stuck/choking, his throat was stretched 7 months ago.   He states he is to continue famotidine for 1 year and then will follow up with Dr. HungBenson [place]  states omeprazole made him itch  Plan Continue current medications  CPA will follow up with Dr. Minerva Areola team to let them know that Famotidine needs to be renally dosed to 40 mg every other day or 20 mg  once daily.   Osteoarthritis   Patient has failed these meds in past: Diclofenac, tramadol, meloxicam Patient is currently uncontrolled on the following medications:   Oxycodone/APAP 5/378m 1-2 tablet daily as needed for pain  We discussed:    Pt reports he had Ricketts when he was small and his aunt used a home remedy to help his legs straighten up.   Pt reports he has most recently been using MagniLife cream and sublingual tablets for arthritis pain, but has not noticed an improvement  Patient reports that his pain is right now is 7 -8 out of 10   He is able to bare the pain at a 3 or 4 out of 10   Pt takes oxycodone/APAP as needed for pain (sometimes daily, but usually every 3-4 days)  Pt reports that he has a high pain tolerance but none of the medication really helps him as much  Discussed not using too much Tylenol since also taking oxycodone/APAP  Pt mailbox is now on the house and he does not have walk to the mail box anymore.   Plan Continue current medications  Try Voltaren gel 1% over the counter (2 grams applied to affected joints up to 4 times daily) Start scheduled Tylenol 5048m2 tablets in the morning and 2 tablet in the evening  Health Maintenance   Patient is currently on the following medications:  . Aspirin 8131maily (Equate) . Loratadine 5m68mily . Multivitamin 50+ daily (Equate) . Saw palmetto 450mg41mapsules daily ([facility] discussed:   . Pt is taking an allergy medication but cannot recall the name and did not bring with his other medications to appointment today  Plan Continue current medications, will collaborate with PCP on whether patient should continue to take ASA 81 mg tablet.   Vaccines   Reviewed and discussed patient's vaccination history.    Immunization History  Administered Date(s) Administered  . Fluad Quad(high Dose 65+) 08/07/2019, 08/15/2020  . Influenza, High Dose Seasonal PF 09/06/2017  .  Influenza-Unspecified 08/04/2018  . Moderna SARS-COV2 Booster Vaccination 09/30/2020  . Moderna Sars-Covid-2 Vaccination 12/27/2019, 01/24/2020  . Pneumococcal Conjugate-13 08/27/2019  . Pneumococcal-Unspecified 06/18/2011  . Zoster Recombinat (Shingrix) 08/27/2019  2nd dose of Shingrix received: 10/26/19  We discussed:  Pt up to date on immunizations with the exception of tetanus  Pt does not recall last tetanus shot  No record of tetanus in Epic or [facility] [place] electronic [facility] record for history of tetanus vaccines Recommended patient receive Tdap vaccine in office  Medication Management   Pt uses [facility] for all medications Uses pill box? No - He has been taking his medications for years and states that he has a routine Pt endorses 100% compliance  We discussed:  . Importance of taking each medications daily as directed . Benefits os using a pill box . Pt enjoys the [facility] with [facility] reports his medications are free through [facility] . Adherence packaging and delivery available with [facility]  Plan Continue current medication management strategy  Follow up: 3 month phone visit  ValliOrlando PennerrmD Clinical Pharmacist [facility] [phone]

## 2020-12-07 NOTE — Patient Instructions (Signed)
Visit Information  Goals Addressed            This Visit's Progress   . Pharmacy Care Plan       CARE PLAN ENTRY (see longitudinal plan of care for additional care plan information)  Current Barriers:  . Chronic Disease Management support, education, and care coordination needs related to Hypertension, Hyperlipidemia, and Osteoarthritis   Hypertension BP Readings from Last 3 Encounters:  08/15/20 124/70  12/30/19 112/66  12/30/19 112/66   . Pharmacist Clinical Goal(s): o Over the next 180 days, patient will work with PharmD and providers to maintain BP goal <130/80 . Current regimen:  o Amlodipine 5mg  daily o Valsartan/HCTZ 80-12.5mg  daily . Interventions: o Provided dietary and exercise recommendations o Recommend patient obtain home blood pressure monitor if he begins to feel symptomatic (headaches, dizziness, lightheadedness, etc) o Advised patient to he could have blood pressure checked at local pharmacy if needed . Patient self care activities - Over the next 180 days, patient will: o Check BP if symptomatic, document, and provide at future appointments o Ensure daily salt intake < 2300 mg/day o Exercise as able with a goal of 30 minutes 5 times weekly (150 minutes per week total)  Hyperlipidemia Lab Results  Component Value Date/Time   LDLCALC 113 (H) 08/15/2020 02:51 PM   . Pharmacist Clinical Goal(s): o Over the next 180 days, patient will work with PharmD and providers to achieve LDL goal < 100 . Current regimen:  o Omega-3 fatty acids 1200mg  once daily o Red yeast rice 2 capsules at bedtime . Interventions: o Provided dietary and exercise recommendations o Discussed appropriate goal for LDL (less than 100) . Patient self care activities - Over the next 180 days, patient will: o Exercise as able with a goal of 30 minutes 5 times weekly (150 minutes per week total) o Focus on heart healthy, well-balanced diet (diet recommendations included  below)  Osteoarthritis . Pharmacist Clinical Goal(s) o Over the next 90 days, patient will work with PharmD and providers to manage symptoms of arthritis and pain . Current regimen:  o Oxycodone/APAP 5/325mg  1-2 tablets daily as needed for pain . Interventions: o Discussed previous treatments patient has tried for arthritis pain  o Recommend Voltaren gel over the counter to be applied to joints topically for pain o Recommend scheduling Tylenol 500mg  to help with pain . Patient self care activities - Over the next 90 days, patient will: o Try using Voltaren gel 1% (available over the counter without a prescription) using 2 grams applied to affected joints up to four times daily as needed o Try scheduled Tylenol 500mg  daily. Take 2 tablets in the morning and 2 tablets in the evening daily  Medication management . Pharmacist Clinical Goal(s): o Over the next 180 days, patient will work with PharmD and providers to maintain optimal medication adherence . [facility] pharmacy[facility] . Interventions o Comprehensive medication review performed. o Continue current medication management strategy . Patient self care activities - Over the next 180 days, patient will: o Focus on medication adherence by considering use of a pill box o Take medications as prescribed o Report any questions or concerns to PharmD and/or provider(s)  Please see past updates related to this goal by clicking on the "Past Updates" button in the selected goal         The patient verbalized understanding of instructions, educational materials, and care plan provided today and declined offer to receive copy of patient instructions, educational  materials, and care plan.   Telephone follow up appointment with pharmacy team member scheduled for: 3 months   Mayford Knife, [facility]

## 2020-12-29 DIAGNOSIS — H02403 Unspecified ptosis of bilateral eyelids: Secondary | ICD-10-CM | POA: Diagnosis not present

## 2020-12-29 DIAGNOSIS — Z961 Presence of intraocular lens: Secondary | ICD-10-CM | POA: Diagnosis not present

## 2020-12-29 DIAGNOSIS — H33052 Total retinal detachment, left eye: Secondary | ICD-10-CM | POA: Diagnosis not present

## 2020-12-29 DIAGNOSIS — H401131 Primary open-angle glaucoma, bilateral, mild stage: Secondary | ICD-10-CM | POA: Diagnosis not present

## 2020-12-29 DIAGNOSIS — H04123 Dry eye syndrome of bilateral lacrimal glands: Secondary | ICD-10-CM | POA: Diagnosis not present

## 2021-01-04 ENCOUNTER — Ambulatory Visit (INDEPENDENT_AMBULATORY_CARE_PROVIDER_SITE_OTHER): Payer: Medicare Other

## 2021-01-04 ENCOUNTER — Ambulatory Visit: Payer: Self-pay | Admitting: Internal Medicine

## 2021-01-04 VITALS — Ht 67.0 in | Wt 160.0 lb

## 2021-01-04 DIAGNOSIS — Z0000 Encounter for general adult medical examination without abnormal findings: Secondary | ICD-10-CM

## 2021-01-04 NOTE — Patient Instructions (Signed)
Wayne [name] , Thank you for taking time to come for your Medicare Wellness Visit. I appreciate your ongoing commitment to your health goals. Please review the following plan we discussed and let me know if I can assist you in the future.   Screening recommendations/referrals: Colonoscopy: not required Recommended yearly ophthalmology/optometry visit for glaucoma screening and checkup Recommended yearly dental visit for hygiene and checkup  Vaccinations: Influenza vaccine: completed 08/15/2020, due 07/03/2021 Pneumococcal vaccine: completed 08/27/2019 Tdap vaccine: completed 08/10/2011, due 08/09/2021 Shingles vaccine: completed   Covid-19:  09/30/2020, 01/24/2020, 12/27/2019  Advanced directives: Please bring a copy of your POA (Power of Attorney) and/or Living Will to your next appointment.   Conditions/risks identified: none  Next appointment: Follow up in one year for your annual wellness visit.   Preventive Care 85 Years and Older, Male Preventive care refers to lifestyle choices and visits with your health care provider that can promote health and wellness. What does preventive care include?  A yearly physical exam. This is also called an annual well check.  Dental exams once or twice a year.  Routine eye exams. Ask your health care provider how often you should have your eyes checked.  Personal lifestyle choices, including:  Daily care of your teeth and gums.  Regular physical activity.  Eating a healthy diet.  Avoiding tobacco and drug use.  Limiting alcohol use.  Practicing safe sex.  Taking low doses of aspirin every day.  Taking vitamin and mineral supplements as recommended by your health care provider. What happens during an annual well check? The services and screenings done by your health care provider during your annual well check will depend on your age, overall health, lifestyle risk factors, and family history of disease. Counseling  Your health care provider  may ask you questions about your:  Alcohol use.  Tobacco use.  Drug use.  Emotional well-being.  Home and relationship well-being.  Sexual activity.  Eating habits.  History of falls.  Memory and ability to understand (cognition).  Work and work Statistician. Screening  You may have the following tests or measurements:  Height, weight, and BMI.  Blood pressure.  Lipid and cholesterol levels. These may be checked every 5 years, or more frequently if you are over 85 years old.  Skin check.  Lung cancer screening. You may have this screening every year starting at age 855 if you have a 30-pack-year history of smoking and currently smoke or have quit within the past 15 years.  Fecal occult blood test (FOBT) of the stool. You may have this test every year starting at age 855.  Flexible sigmoidoscopy or colonoscopy. You may have a sigmoidoscopy every 5 years or a colonoscopy every 10 years starting at age 855.  Prostate cancer screening. Recommendations will vary depending on your family history and other risks.  Hepatitis C blood test.  Hepatitis B blood test.  Sexually transmitted disease (STD) testing.  Diabetes screening. This is done by checking your blood sugar (glucose) after you have not eaten for a while (fasting). You may have this done every 1-3 years.  Abdominal aortic aneurysm (AAA) screening. You may need this if you are a current or former smoker.  Osteoporosis. You may be screened starting at age 85 if you are at high risk. Talk with your health care provider about your test results, treatment options, and if necessary, the need for more tests. Vaccines  Your health care provider may recommend certain vaccines, such as:  Influenza vaccine. This  is recommended every year.  Tetanus, diphtheria, and acellular pertussis (Tdap, Td) vaccine. You may need a Td booster every 10 years.  Zoster vaccine. You may need this after age 85.  Pneumococcal 13-valent  conjugate (PCV13) vaccine. One dose is recommended after age 85.  Pneumococcal polysaccharide (PPSV23) vaccine. One dose is recommended after age 85. Talk to your health care provider about which screenings and vaccines you need and how often you need them. This information is not intended to replace advice given to you by your health care provider. Make sure you discuss any questions you have with your health care provider. Document Released: 12/16/2015 Document Revised: 08/08/2016 Document Reviewed: 09/20/2015 Elsevier Interactive Patient Education  2017 [employer] Prevention in the Home Falls can cause injuries. They can happen to people of all ages. There are many things you can do to make your home safe and to help prevent falls. What can I do on the outside of my home?  Regularly fix the edges of walkways and driveways and fix any cracks.  Remove anything that might make you trip as you walk through a door, such as a raised step or threshold.  Trim any bushes or trees on the path to your home.  Use bright outdoor lighting.  Clear any walking paths of anything that might make someone trip, such as rocks or tools.  Regularly check to see if handrails are loose or broken. Make sure that both sides of any steps have handrails.  Any raised decks and porches should have guardrails on the edges.  Have any leaves, snow, or ice cleared regularly.  Use sand or salt on walking paths during winter.  Clean up any spills in your garage right away. This includes oil or grease spills. What can I do in the bathroom?  Use night lights.  Install grab bars by the toilet and in the tub and shower. Do not use towel bars as grab bars.  Use non-skid mats or decals in the tub or shower.  If you need to sit down in the shower, use a plastic, non-slip stool.  Keep the floor dry. Clean up any water that spills on the floor as soon as it happens.  Remove soap buildup in the tub or  shower regularly.  Attach bath mats securely with double-sided non-slip rug tape.  Do not have throw rugs and other things on the floor that can make you trip. What can I do in the bedroom?  Use night lights.  Make sure that you have a light by your bed that is easy to reach.  Do not use any sheets or blankets that are too big for your bed. They should not hang down onto the floor.  Have a firm chair that has side arms. You can use this for support while you get dressed.  Do not have throw rugs and other things on the floor that can make you trip. What can I do in the kitchen?  Clean up any spills right away.  Avoid walking on wet floors.  Keep items that you use a lot in easy-to-reach places.  If you need to reach something above you, use a strong step stool that has a grab bar.  Keep electrical cords out of the way.  Do not use floor polish or wax that makes floors slippery. If you must use wax, use non-skid floor wax.  Do not have throw rugs and other things on the floor that can make you  trip. What can I do with my stairs?  Do not leave any items on the stairs.  Make sure that there are handrails on both sides of the stairs and use them. Fix handrails that are broken or loose. Make sure that handrails are as long as the stairways.  Check any carpeting to make sure that it is firmly attached to the stairs. Fix any carpet that is loose or worn.  Avoid having throw rugs at the top or bottom of the stairs. If you do have throw rugs, attach them to the floor with carpet tape.  Make sure that you have a light switch at the top of the stairs and the bottom of the stairs. If you do not have them, ask someone to add them for you. What else can I do to help prevent falls?  Wear shoes that:  Do not have high heels.  Have rubber bottoms.  Are comfortable and fit you well.  Are closed at the toe. Do not wear sandals.  If you use a stepladder:  Make sure that it is fully  opened. Do not climb a closed stepladder.  Make sure that both sides of the stepladder are locked into place.  Ask someone to hold it for you, if possible.  Clearly mark and make sure that you can see:  Any grab bars or handrails.  First and last steps.  Where the edge of each step is.  Use tools that help you move around (mobility aids) if they are needed. These include:  Canes.  Walkers.  Scooters.  Crutches.  Turn on the lights when you go into a dark area. Replace any light bulbs as soon as they burn out.  Set up your furniture so you have a clear path. Avoid moving your furniture around.  If any of your floors are uneven, fix them.  If there are any pets around you, be aware of where they are.  Review your medicines with your doctor. Some medicines can make you feel dizzy. This can increase your chance of falling. Ask your doctor what other things that you can do to help prevent falls. This information is not intended to replace advice given to you by your health care provider. Make sure you discuss any questions you have with your health care provider. Document Released: 09/15/2009 Document Revised: 04/26/2016 Document Reviewed: 12/24/2014 Elsevier Interactive Patient Education  2017 [employer].

## 2021-01-04 NOTE — Progress Notes (Signed)
I connected with Neale Burly today by telephone and verified that I am speaking with the correct person using two identifiers. Location patient: home Location provider: work Persons participating in the virtual visit: Hobson, Lax LPN.   I discussed the limitations, risks, security and privacy concerns of performing an evaluation and management service by telephone and the availability of in person appointments. I also discussed with the patient that there may be a patient responsible charge related to this service. The patient expressed understanding and verbally consented to this telephonic visit.    Interactive audio and video telecommunications were attempted between this provider and patient, however failed, due to patient having technical difficulties OR patient did not have access to video capability.  We continued and completed visit with audio only.     Vital signs may be patient reported or missing.  Subjective:   Wayne [name] is a 87 y.o. male who presents for Medicare Annual/Subsequent preventive examination.  Review of Systems     Cardiac Risk Factors include: advanced age (>52men, >61 women);male gender;sedentary lifestyle     Objective:    Today's Vitals   01/04/21 1359  Weight: 160 lb (72.6 kg)  Height: 5\' 7"  (1.702 m)   Body mass index is 25.06 kg/m.  Advanced Directives 01/04/2021 12/30/2019 12/25/2018 08/07/2018 08/10/2017 11/22/2015 11/21/2015  Does Patient Have a Medical Advance Directive? Yes Yes Yes No Yes No No  Type of Paramedic of [place];Living will Living will [place];Living will - Living will - -  Does patient want to make changes to medical advance directive? - - No - Patient declined - - - -  Copy of [place] in Chart? No - copy requested - No - copy requested - - - -  Would patient like information on creating a medical advance directive? - - - No - Patient declined  - No - patient declined information No - patient declined information  Pre-existing out of facility DNR order (yellow form or pink MOST form) - - - - - - -    Current Medications (verified) Outpatient Encounter Medications as of 01/04/2021  Medication Sig  . amLODipine (NORVASC) 5 MG tablet TAKE 1 TABLET(5 MG) BY MOUTH DAILY  . aspirin EC 81 MG tablet Take 81 mg by mouth daily.  . Cholecalciferol (VITAMIN D3) 125 MCG (5000 UT) CAPS Take by mouth daily. Monday, Wednesday, Friday  . Multiple Vitamin (MULTIVITAMIN WITH MINERALS) TABS tablet Take 1 tablet by mouth daily.  . Omega-3 Fatty Acids (FISH OIL) 1200 MG CAPS Take 1 capsule by mouth daily.   Marland Kitchen oxyCODONE-acetaminophen (PERCOCET/ROXICET) 5-325 MG tablet Take 1-2 tablets by mouth daily as needed for pain.   . Red Yeast Rice Extract (RED YEAST RICE PO) Take 2 capsules by mouth daily.  . Saw Palmetto 450 MG CAPS Take 2 capsules by mouth daily.  . tamsulosin (FLOMAX) 0.4 MG CAPS capsule Take 0.4 mg by mouth daily.   . timolol (BETIMOL) 0.5 % ophthalmic solution Place 1 drop into both eyes daily.   . valsartan-hydrochlorothiazide (DIOVAN-HCT) 80-12.5 MG tablet TAKE 1 TABLET BY MOUTH  DAILY  . B Complex-C (B-COMPLEX WITH VITAMIN C) tablet Take 1 tablet by mouth daily. (Patient not taking: Reported on 01/04/2021)  . famotidine (PEPCID) 20 MG tablet Take 2 tablets by mouth 2 (two) times daily.  (Patient not taking: No sig reported)  . famotidine (PEPCID) 40 MG tablet  (Patient not taking: No sig reported)  .  fluocinonide cream (LIDEX) 4.09 % Apply 1 application topically 2 (two) times daily. (Patient not taking: No sig reported)  . ketoconazole (NIZORAL) 2 % cream Apply 1 application topically daily. (Patient not taking: No sig reported)  . loratadine (CLARITIN) 10 MG tablet Take 1 tablet (10 mg total) by mouth daily.  . NON FORMULARY CBD gummies (Patient not taking: No sig reported)   No facility-administered encounter medications on file as of  01/04/2021.    Allergies (verified) Cephalexin   History: Past Medical History:  Diagnosis Date  . Arthritis   . Cataract   . Glaucoma   . Hypertension    Past Surgical History:  Procedure Laterality Date  . APPENDECTOMY    . BACK SURGERY    . ballon angioplasty  03/18/2012  . FOOT SURGERY    . JOINT REPLACEMENT    . kidney stent Left 2015  . KNEE SURGERY Left x 2   . left ankle    . LEFT HEART CATHETERIZATION WITH CORONARY ANGIOGRAM N/A 03/18/2012   Procedure: LEFT HEART CATHETERIZATION WITH CORONARY ANGIOGRAM;  Surgeon: Laverda Page, MD;  Location: [facility] CATH LAB;  Service: Cardiovascular;  Laterality: N/A;  . RENAL ANGIOGRAM N/A 03/18/2012   Procedure: RENAL ANGIOGRAM;  Surgeon: Laverda Page, MD;  Location: [facility] CATH LAB;  Service: Cardiovascular;  Laterality: N/A;  . right wrist    . SPINE SURGERY     Family History  Problem Relation Age of Onset  . Dementia Mother   . Cancer Father    Social History   Socioeconomic History  . Marital status: Widowed    Spouse name: Not on file  . Number of children: 2  . Years of education: 12.5  . Highest education level: Not on file  Occupational History  . Occupation: retired    Comment: [employer]  Tobacco Use  . Smoking status: Former Smoker    Packs/day: 0.25    Years: 3.00    Pack years: 0.75    Types: Cigarettes    Quit date: 06/02/1972    Years since quitting: 48.6  . Smokeless tobacco: Never Used  Vaping Use  . Vaping Use: Never used  Substance and Sexual Activity  . Alcohol use: Not Currently  . Drug use: Yes    Types: Oxycodone  . Sexual activity: Not Currently  Other Topics Concern  . Not on file  Social History Narrative   Lives at home by himself   Caffeine use- none   Social Determinants of Health   Financial Resource Strain: Low Risk   . Difficulty of Paying Living Expenses: Not hard at all  Food Insecurity: No Food Insecurity  . Worried About Charity fundraiser in the Last Year: Never true   . Ran Out of Food in the Last Year: Never true  Transportation Needs: No Transportation Needs  . Lack of Transportation (Medical): No  . Lack of Transportation (Non-Medical): No  Physical Activity: Inactive  . Days of Exercise per Week: 0 days  . Minutes of Exercise per Session: 0 min  Stress: No Stress Concern Present  . Feeling of Stress : Not at all  Social Connections: Not on file    Tobacco Counseling Counseling given: Not Answered   Clinical Intake:  Pre-visit preparation completed: Yes  Pain : No/denies pain     Nutritional Status: BMI 25 -29 Overweight Nutritional Risks: None Diabetes: No  How often do you need to have someone help you when you read instructions, pamphlets,  or other written materials from your doctor or pharmacy?: 1 - Never What is the last grade level you completed in school?: 12th grade  Diabetic? no  Interpreter Needed?: No  Information entered by :: NAllen LPN   Activities of Daily Living In your present state of health, do you have any difficulty performing the following activities: 01/04/2021  Hearing? Y  Comment left ear  Vision? Y  Comment left eye decreased vision  Difficulty concentrating or making decisions? N  Walking or climbing stairs? Y  Comment due to feet and legs  Dressing or bathing? N  Doing errands, shopping? N  Preparing Food and eating ? N  Using the Toilet? N  In the past six months, have you accidently leaked urine? N  Do you have problems with loss of bowel control? N  Managing your Medications? N  Managing your Finances? N  Housekeeping or managing your Housekeeping? N  Some recent data might be hidden    Patient Care Team: Glendale Chard, MD as PCP - General (Internal Medicine) Warden Fillers, MD as Consulting Physician (Ophthalmology) Caudill, Kennieth Francois, [facility] (Inactive) (Pharmacist)  Indicate any recent Medical Services you may have received from other than [facility] providers in the past year (date  may be approximate).     Assessment:   This is a routine wellness examination for Azar.  Hearing/Vision screen  Hearing Screening   125Hz  250Hz  500Hz  1000Hz  2000Hz  3000Hz  4000Hz  6000Hz  8000Hz   Right ear:           Left ear:           Vision Screening Comments: Regular eye exams, Dr. Katy Fitch  Dietary issues and exercise activities discussed: Current Exercise Habits: The patient does not participate in regular exercise at present  Goals    .  Patient Stated (pt-stated)      Wants to remain in best health that he can be in    .  Patient Stated      12/30/2019, no goals    .  Patient Stated      01/04/2021, wants to decrease pain    .  Pharmacy Care Plan      CARE PLAN ENTRY (see longitudinal plan of care for additional care plan information)  Current Barriers:  . Chronic Disease Management support, education, and care coordination needs related to Hypertension, Hyperlipidemia, and Osteoarthritis   Hypertension BP Readings from Last 3 Encounters:  08/15/20 124/70  12/30/19 112/66  12/30/19 112/66   . Pharmacist Clinical Goal(s): o Over the next 180 days, patient will work with PharmD and providers to maintain BP goal <130/80 . Current regimen:  o Amlodipine 5mg  daily o Valsartan/HCTZ 80-12.5mg  daily . Interventions: o Provided dietary and exercise recommendations o Recommend patient obtain home blood pressure monitor if he begins to feel symptomatic (headaches, dizziness, lightheadedness, etc) o Advised patient to he could have blood pressure checked at local pharmacy if needed . Patient self care activities - Over the next 180 days, patient will: o Check BP if symptomatic, document, and provide at future appointments o Ensure daily salt intake < 2300 mg/day o Exercise as able with a goal of 30 minutes 5 times weekly (150 minutes per week total)  Hyperlipidemia Lab Results  Component Value Date/Time   LDLCALC 113 (H) 08/15/2020 02:51 PM   . Pharmacist Clinical  Goal(s): o Over the next 180 days, patient will work with PharmD and providers to achieve LDL goal < 100 . Current regimen:  o Omega-3 fatty  acids 1200mg  once daily o Red yeast rice 2 capsules at bedtime . Interventions: o Provided dietary and exercise recommendations o Discussed appropriate goal for LDL (less than 100) . Patient self care activities - Over the next 180 days, patient will: o Exercise as able with a goal of 30 minutes 5 times weekly (150 minutes per week total) o Focus on heart healthy, well-balanced diet (diet recommendations included below)  Osteoarthritis . Pharmacist Clinical Goal(s) o Over the next 90 days, patient will work with PharmD and providers to manage symptoms of arthritis and pain . Current regimen:  o Oxycodone/APAP 5/325mg  1-2 tablets daily as needed for pain . Interventions: o Discussed previous treatments patient has tried for arthritis pain  o Recommend Voltaren gel over the counter to be applied to joints topically for pain o Recommend scheduling Tylenol 500mg  to help with pain . Patient self care activities - Over the next 90 days, patient will: o Try using Voltaren gel 1% (available over the counter without a prescription) using 2 grams applied to affected joints up to four times daily as needed o Try scheduled Tylenol 500mg  daily. Take 2 tablets in the morning and 2 tablets in the evening daily  Medication management . Pharmacist Clinical Goal(s): o Over the next 180 days, patient will work with PharmD and providers to maintain optimal medication adherence . Current pharmacy: [facility] pharmacy . Interventions o Comprehensive medication review performed. o Continue current medication management strategy . Patient self care activities - Over the next 180 days, patient will: o Focus on medication adherence by considering use of a pill box o Take medications as prescribed o Report any questions or concerns to PharmD and/or  provider(s)  Please see past updates related to this goal by clicking on the "Past Updates" button in the selected goal        Depression Screen PHQ 2/9 Scores 01/04/2021 12/30/2019 03/31/2019 03/09/2019 12/25/2018 12/04/2018 06/10/2018  PHQ - 2 Score 0 0 0 0 0 0 0  PHQ- 9 Score - 3 - - 5 - -    Fall Risk Fall Risk  01/04/2021 12/30/2019 06/30/2019 03/31/2019 03/09/2019  Falls in the past year? 1 0 1 0 0  Comment equilibrium is off - - - -  Number falls in past yr: 1 - 0 - -  Comment - - - - -  Injury with Fall? 0 - 0 - -  Comment - - - - -  Risk for fall due to : Impaired balance/gait;Impaired mobility;Medication side effect Impaired balance/gait;Medication side effect - - -  Risk for fall due to: Comment - - - - -  Follow up Falls evaluation completed;Education provided;Falls prevention discussed Falls evaluation completed;Education provided;Falls prevention discussed - - -    FALL RISK PREVENTION PERTAINING TO THE HOME:  Any stairs in or around the home? Yes  If so, are there any without handrails? No  Home free of loose throw rugs in walkways, pet beds, electrical cords, etc? Yes  Adequate lighting in your home to reduce risk of falls? Yes   ASSISTIVE DEVICES UTILIZED TO PREVENT FALLS:  Life alert? Yes  Use of a cane, walker or w/c? Yes  Grab bars in the bathroom? No  Shower chair or bench in shower? Yes  Elevated toilet seat or a handicapped toilet? Yes   TIMED UP AND GO:  Was the test performed? No .    Cognitive Function:     6CIT Screen 01/04/2021 12/30/2019 12/25/2018  What Year?  0 points 0 points 0 points  What month? 0 points 0 points 0 points  What time? 0 points 0 points 0 points  Count back from 20 0 points 0 points 0 points  Months in reverse 0 points 0 points 0 points  Repeat phrase 8 points 4 points 2 points  Total Score 8 4 2     Immunizations Immunization History  Administered Date(s) Administered  . Fluad Quad(high Dose 65+) 08/07/2019, 08/15/2020  .  Influenza, High Dose Seasonal PF 09/06/2017  . Influenza-Unspecified 08/04/2018  . Moderna SARS-COV2 Booster Vaccination 09/30/2020  . Moderna Sars-Covid-2 Vaccination 12/27/2019, 01/24/2020  . Pneumococcal Conjugate-13 08/27/2019  . Pneumococcal-Unspecified 06/18/2011  . Zoster Recombinat (Shingrix) 08/27/2019    TDAP status: Up to date  Flu Vaccine status: Up to date  Pneumococcal vaccine status: Up to date  Covid-19 vaccine status: Completed vaccines  Qualifies for Shingles Vaccine? Yes   Zostavax completed No   Shingrix Completed?: Yes  Screening Tests Health Maintenance  Topic Date Due  . TETANUS/TDAP  08/09/2021  . INFLUENZA VACCINE  Completed  . COVID-19 Vaccine  Completed  . PNA vac Low Risk Adult  Completed    Health Maintenance  There are no preventive care reminders to display for this patient.  Colorectal cancer screening: No longer required.   Lung Cancer Screening: (Low Dose CT Chest recommended if Age 3-80 years, 30 pack-year currently smoking OR have quit w/in 15years.) does not qualify.   Lung Cancer Screening Referral: no  Additional Screening:  Hepatitis C Screening: does not qualify;   Vision Screening: Recommended annual ophthalmology exams for early detection of glaucoma and other disorders of the eye. Is the patient up to date with their annual eye exam?  Yes  Who is the provider or what is the name of the office in which the patient attends annual eye exams? Dr. Katy Fitch If pt is not established with a provider, would they like to be referred to a provider to establish care? No .   Dental Screening: Recommended annual dental exams for proper oral hygiene  Community Resource Referral / Chronic Care Management: CRR required this visit?  No   CCM required this visit?  No      Plan:     I have personally reviewed and noted the following in the patient's chart:   . Medical and social history . Use of alcohol, tobacco or illicit drugs   . Current medications and supplements . Functional ability and status . Nutritional status . Physical activity . Advanced directives . List of other physicians . Hospitalizations, surgeries, and ER visits in previous 12 months . Vitals . Screenings to include cognitive, depression, and falls . Referrals and appointments  In addition, I have reviewed and discussed with patient certain preventive protocols, quality metrics, and best practice recommendations. A written personalized care plan for preventive services as well as general preventive health recommendations were provided to patient.     Kellie Simmering, LPN   02/04/3298   Nurse Notes:

## 2021-01-12 ENCOUNTER — Telehealth: Payer: Self-pay

## 2021-01-12 NOTE — Chronic Care Management (AMB) (Signed)
Chronic Care Management Pharmacy Assistant   Name: Wayne [name]  MRN: [id number] DOB: September 17, 1926  Reason for Encounter: Diabetes Adherence Call  Patient Questions:  1.  Have you seen any other providers since your last visit? Yes, 01/04/21-Allen, Marissa Calamity, LPN ([place]).    2.  Any changes in your medicines or health? No     PCP : Glendale Chard, MD  Allergies:   Allergies  Allergen Reactions   Cephalexin     Severe mouth and throat dryness.    Medications: Outpatient Encounter Medications as of 01/12/2021  Medication Sig   amLODipine (NORVASC) 5 MG tablet TAKE 1 TABLET(5 MG) BY MOUTH DAILY   aspirin EC 81 MG tablet Take 81 mg by mouth daily.   B Complex-C (B-COMPLEX WITH VITAMIN C) tablet Take 1 tablet by mouth daily. (Patient not taking: Reported on 01/04/2021)   Cholecalciferol (VITAMIN D3) 125 MCG (5000 UT) CAPS Take by mouth daily. Monday, Wednesday, Friday   famotidine (PEPCID) 20 MG tablet Take 2 tablets by mouth 2 (two) times daily.  (Patient not taking: No sig reported)   famotidine (PEPCID) 40 MG tablet  (Patient not taking: No sig reported)   fluocinonide cream (LIDEX) 1.70 % Apply 1 application topically 2 (two) times daily. (Patient not taking: No sig reported)   ketoconazole (NIZORAL) 2 % cream Apply 1 application topically daily. (Patient not taking: No sig reported)   loratadine (CLARITIN) 10 MG tablet Take 1 tablet (10 mg total) by mouth daily.   Multiple Vitamin (MULTIVITAMIN WITH MINERALS) TABS tablet Take 1 tablet by mouth daily.   NON FORMULARY CBD gummies (Patient not taking: No sig reported)   Omega-3 Fatty Acids (FISH OIL) 1200 MG CAPS Take 1 capsule by mouth daily.    oxyCODONE-acetaminophen (PERCOCET/ROXICET) 5-325 MG tablet Take 1-2 tablets by mouth daily as needed for pain.    Red Yeast Rice Extract (RED YEAST RICE PO) Take 2 capsules by mouth daily.   Saw Palmetto 450 MG CAPS Take 2 capsules by mouth daily.   tamsulosin (FLOMAX)  0.4 MG CAPS capsule Take 0.4 mg by mouth daily.    timolol (BETIMOL) 0.5 % ophthalmic solution Place 1 drop into both eyes daily.    valsartan-hydrochlorothiazide (DIOVAN-HCT) 80-12.5 MG tablet TAKE 1 TABLET BY MOUTH  DAILY   No facility-administered encounter medications on file as of 01/12/2021.    Current Diagnosis: Patient Active Problem List   Diagnosis Date Noted   Renal artery atherosclerosis ([place]) 12/30/2019   Viral upper respiratory tract infection 03/09/2019   Hearing loss due to cerumen impaction, bilateral 12/04/2018   Acute back pain 06/10/2018   Spondylosis of lumbar region without myelopathy or radiculopathy 06/10/2018   Carpal tunnel syndrome of left wrist 12/12/2017   Hammertoe 07/01/2016   Anemia of chronic renal failure, stage 3 (moderate) ([place]) 11/23/2015   Dehydration 11/22/2015   CKD (chronic kidney disease) stage 3, GFR 30-59 ml/min (HCC) 11/22/2015   Sepsis ([place]) 11/22/2015   Diarrhea 11/22/2015   Leukopenia 11/22/2015   Benign essential HTN 11/22/2015   Coronary artery disease involving native coronary artery without angina pectoris 03/18/2012   Hyperlipidemia 03/18/2012   Reviewed chart prior to disease state call. Spoke with patient regarding BP  Recent Office Vitals: BP Readings from Last 3 Encounters:  08/15/20 124/70  12/30/19 112/66  12/30/19 112/66   Pulse Readings from Last 3 Encounters:  08/15/20 74  12/30/19 67  12/30/19 67    Wt Readings from Last 3 Encounters:  01/04/21 160 lb (72.6 kg)  08/15/20 163 lb 3.2 oz (74 kg)  12/30/19 161 lb 3.2 oz (73.1 kg)     Kidney Function Lab Results  Component Value Date/Time   CREATININE 1.20 08/15/2020 02:51 PM   CREATININE 1.26 12/30/2019 03:38 PM   GFRNONAA 53 (L) 08/15/2020 02:51 PM   GFRAA 61 08/15/2020 02:51 PM    BMP Latest Ref Rng & Units 08/15/2020 12/30/2019 06/30/2019  Glucose 65 - 99 mg/dL 91 100(H) 138(H)  BUN 10 - 36 mg/dL 20 20 26   Creatinine 0.76 - 1.27 mg/dL  1.20 1.26 1.28(H)  BUN/Creat Ratio 10 - 24 17 16 20   Sodium 134 - 144 mmol/L 142 142 144  Potassium 3.5 - 5.2 mmol/L 4.1 4.4 4.2  Chloride 96 - 106 mmol/L 106 104 104  CO2 20 - 29 mmol/L 24 26 24   Calcium 8.6 - 10.2 mg/dL 9.3 9.4 9.8     Current antihypertensive regimen:  ? Amlodipine 5mg  daily ? Valsartan/HCTZ 80-12.5mg  daily   How often are you checking your Blood Pressure? Patient does not check at home. Patient states he was not asked to do home checks.    Current home BP readings: None provided.   What recent interventions/DTPs have been made by any provider to improve Blood Pressure control since last CPP Visit:  - Patient reports he is taking blood pressure medications as directed.   Any recent hospitalizations or ED visits since last visit with CPP? No    What diet changes have been made to improve Blood Pressure Control?  o Patient reports he is lightly salting his foods when cooking. Patient states for breakfast he typically eats, oatmeal, waffles or applesauce. For lunch he will eat cereal, or make a sandwich, likes to drink juice or water. For dinner; pinto beans, turnip greens, collard greens, baked chicken, wieners cut up mixed with sauerkraut. o Patient also stated he will sometime go to the town [place] country market and pick out country bacon.   What exercise is being done to improve your Blood Pressure Control?  o Patient stated because of his arthritis he is unable to walk or stand for long periods of time.   Adherence Review: Is the patient currently on ACE/ARB medication? No Does the patient have >5 day gap between last estimated fill dates? No   01/12/2021 Name: Wayne [name] MRN: [id number] DOB: 09/14/1926 FINNIS [name] is a 87 y.o. year old male who is a primary care patient of Glendale Chard, MD.  Comprehensive medication review performed; Spoke to patient regarding cholesterol  Lipid Panel    Component Value Date/Time   CHOL 179 08/15/2020  1451   TRIG 84 08/15/2020 1451   HDL 50 08/15/2020 1451   LDLCALC 113 (H) 08/15/2020 1451    10-year ASCVD risk score: The ASCVD Risk score Mikey Bussing DC Jr., et al., 2013) failed to calculate for the following reasons:   The 2013 ASCVD risk score is only valid for ages 51 to 22   Current antihyperlipidemic regimen:  ? Omega-3 fatty acids 1200mg  once daily ? Red rice yeast 2 capsules at bedtime   Previous antihyperlipidemic medications tried: Pravastatin   ASCVD risk enhancing conditions: age >81, HTN and CKD    What recent interventions/DTPs have been made by any provider to improve Cholesterol control since last CPP Visit:  - Patient voiced he is taking medications as directed.   Any recent hospitalizations or ED visits since last visit with CPP? No  What diet changes have been made to improve Cholesterol?  o Patient reports he is lightly salting his foods when cooking. Patient states for breakfast he typically eats, oatmeal, waffles or applesauce. For lunch he will eat cereal, or make a sandwich, likes to drink juice or water. For dinner; pinto beans, turnip greens, collard greens, baked chicken, wieners cut up mixed with sauerkraut. o Patient also stated he will sometime go to the [place] and pick out country bacon.   What exercise is being done to improve Cholesterol?  o Patient stated because of his arthritis he is unable to walk or stand for long periods of time.  Adherence Review: Does the patient have >5 day gap between last estimated fill dates? No   Goals Addressed            This Visit's Progress    Pharmacy Care Plan   On track    CARE PLAN ENTRY (see longitudinal plan of care for additional care plan information)  Current Barriers:   Chronic Disease Management support, education, and care coordination needs related to Hypertension, Hyperlipidemia, and Osteoarthritis   Hypertension BP Readings from Last 3 Encounters:  08/15/20 124/70   12/30/19 112/66  12/30/19 112/66    Pharmacist Clinical Goal(s): o Over the next 180 days, patient will work with PharmD and providers to maintain BP goal <130/80  Current regimen:  o Amlodipine 5mg  daily o Valsartan/HCTZ 80-12.5mg  daily  Interventions: o Provided dietary and exercise recommendations o Recommend patient obtain home blood pressure monitor if he begins to feel symptomatic (headaches, dizziness, lightheadedness, etc) o Advised patient to he could have blood pressure checked at local pharmacy if needed  Patient self care activities - Over the next 180 days, patient will: o Check BP if symptomatic, document, and provide at future appointments o Ensure daily salt intake < 2300 mg/day o Exercise as able with a goal of 30 minutes 5 times weekly (150 minutes per week total)  Hyperlipidemia Lab Results  Component Value Date/Time   LDLCALC 113 (H) 08/15/2020 02:51 PM    Pharmacist Clinical Goal(s): o Over the next 180 days, patient will work with PharmD and providers to achieve LDL goal < 100  Current regimen:  o Omega-3 fatty acids 1200mg  once daily o Red yeast rice 2 capsules at bedtime  Interventions: o Provided dietary and exercise recommendations o Discussed appropriate goal for LDL (less than 100)  Patient self care activities - Over the next 180 days, patient will: o Exercise as able with a goal of 30 minutes 5 times weekly (150 minutes per week total) o Focus on heart healthy, well-balanced diet (diet recommendations included below)  Osteoarthritis  Pharmacist Clinical Goal(s) o Over the next 90 days, patient will work with PharmD and providers to manage symptoms of arthritis and pain  Current regimen:  o Oxycodone/APAP 5/325mg  1-2 tablets daily as needed for pain  Interventions: o Discussed previous treatments patient has tried for arthritis pain  o Recommend Voltaren gel over the counter to be applied to joints topically for pain o Recommend  scheduling Tylenol 500mg  to help with pain  Patient self care activities - Over the next 90 days, patient will: o Try using Voltaren gel 1% (available over the counter without a prescription) using 2 grams applied to affected joints up to four times daily as needed o Try scheduled Tylenol 500mg  daily. Take 2 tablets in the morning and 2 tablets in the evening daily  Medication management  Pharmacist  Clinical Goal(s): o Over the next 180 days, patient will work with PharmD and providers to maintain optimal medication adherence  Current pharmacy: [facility]  Interventions o Comprehensive medication review performed. o Continue current medication management strategy  Patient self care activities - Over the next 180 days, patient will: o Focus on medication adherence by considering use of a pill box o Take medications as prescribed o Report any questions or concerns to PharmD and/or provider(s)  Please see past updates related to this goal by clicking on the "Past Updates" button in the selected goal         Follow-Up:  Pharmacist Review-Prior to purpose of call the patient stated he has complaints of pain on left side of back above left hip that started on Monday. Patient states he took 2 tablets of Arthritis Tylenol but unsure of dosage amount. Patient voiced his arthritis has been a major concern for him and he has tried what Orlando Penner, CPP recommended from last encounter with her but states the Voltaren gel 1% is not helping and it causes his skin to burn on when in contact. The patient confirmed over phone he did take a dose of Tylenol 500 mg before phone call with me. Informed the patient I will relay this to the pharmacist on possibly finding a different alternative for his needs after attempting to relieve his pain. The patient verbalized understanding.   Patient communicated he has called [facility] to take him off of auto-refill due receiving an abundance supply of  his medications before his next refill date.  Orlando Penner, CPP Notified.  Raynelle Highland, [place] Pharmacist Assistant [phone] CCM Total Time:34 minutes

## 2021-01-23 ENCOUNTER — Other Ambulatory Visit: Payer: Self-pay

## 2021-01-23 ENCOUNTER — Encounter: Payer: Self-pay | Admitting: Internal Medicine

## 2021-01-23 ENCOUNTER — Ambulatory Visit (INDEPENDENT_AMBULATORY_CARE_PROVIDER_SITE_OTHER): Payer: Medicare Other | Admitting: Internal Medicine

## 2021-01-23 VITALS — BP 146/92 | HR 75 | Temp 98.0°F | Ht 67.0 in | Wt 164.4 lb

## 2021-01-23 DIAGNOSIS — I701 Atherosclerosis of renal artery: Secondary | ICD-10-CM

## 2021-01-23 DIAGNOSIS — N183 Chronic kidney disease, stage 3 unspecified: Secondary | ICD-10-CM | POA: Diagnosis not present

## 2021-01-23 DIAGNOSIS — E7800 Pure hypercholesterolemia, unspecified: Secondary | ICD-10-CM | POA: Diagnosis not present

## 2021-01-23 DIAGNOSIS — I1310 Hypertensive heart and chronic kidney disease without heart failure, with stage 1 through stage 4 chronic kidney disease, or unspecified chronic kidney disease: Secondary | ICD-10-CM | POA: Diagnosis not present

## 2021-01-23 DIAGNOSIS — M159 Polyosteoarthritis, unspecified: Secondary | ICD-10-CM | POA: Diagnosis not present

## 2021-01-23 DIAGNOSIS — R202 Paresthesia of skin: Secondary | ICD-10-CM | POA: Diagnosis not present

## 2021-01-23 NOTE — Progress Notes (Signed)
I,Katawbba Wiggins,acting as a Neurosurgeon for [name], MD.,have documented all relevant documentation on the behalf of [name], MD,as directed by  [name], MD while in the presence of [name], MD.  This visit occurred during the SARS-CoV-2 public health emergency.  Safety protocols were in place, including screening questions prior to the visit, additional usage of staff PPE, and extensive cleaning of exam room while observing appropriate contact time as indicated for disinfecting solutions.  Subjective:     Patient ID: Wayne [name] , male    DOB: 04/24/1926 , 87 y.o.   MRN: [id number]   Chief Complaint  Patient presents with  . Hypertension  . Hyperlipidemia    HPI  The patient is here today for a blood pressure and cholesterol follow-up.  He is currently taking valsartan/hctz and amlodipine without any issues. He is taking RYR for chol, does not wish to take statins. He is concerned that it will cause him more pain and that he suffers enough from his arthritic pain.   Hypertension This is a chronic problem. The current episode started more than 1 year ago. The problem has been gradually improving since onset. The problem is controlled. Pertinent negatives include no blurred vision, palpitations or shortness of breath. Past treatments include calcium channel blockers, angiotensin blockers, diuretics and lifestyle changes. The current treatment provides moderate improvement. Hypertensive end-organ damage includes kidney disease.     Past Medical History:  Diagnosis Date  . Arthritis   . Cataract   . Glaucoma   . Hypertension      Family History  Problem Relation Age of Onset  . Dementia Mother   . Cancer Father      Current Outpatient Medications:  .  amLODipine (NORVASC) 5 MG tablet, TAKE 1 TABLET(5 MG) BY MOUTH DAILY, Disp: 90 tablet, Rfl: 2 .  aspirin EC 81 MG tablet, Take 81 mg by mouth daily., Disp: , Rfl:  .  B Complex-C (B-COMPLEX WITH  VITAMIN C) tablet, Take 1 tablet by mouth daily., Disp: , Rfl:  .  Cholecalciferol (VITAMIN D3) 125 MCG (5000 UT) CAPS, Take by mouth daily. Monday, Wednesday, Friday, Disp: , Rfl:  .  fluocinonide cream (LIDEX) 0.05 %, Apply 1 application topically 2 (two) times daily., Disp: 30 g, Rfl: 1 .  ketoconazole (NIZORAL) 2 % cream, Apply 1 application topically daily., Disp: 15 g, Rfl: 1 .  loratadine (CLARITIN) 10 MG tablet, Take 1 tablet (10 mg total) by mouth daily., Disp: 90 tablet, Rfl: 2 .  Multiple Vitamin (MULTIVITAMIN WITH MINERALS) TABS tablet, Take 1 tablet by mouth daily., Disp: , Rfl:  .  Omega-3 Fatty Acids (FISH OIL) 1200 MG CAPS, Take 1 capsule by mouth daily. , Disp: , Rfl:  .  oxyCODONE-acetaminophen (PERCOCET/ROXICET) 5-325 MG tablet, Take 1-2 tablets by mouth daily as needed for pain. , Disp: , Rfl: 0 .  Red Yeast Rice Extract (RED YEAST RICE PO), Take 2 capsules by mouth daily., Disp: , Rfl:  .  Saw Palmetto 450 MG CAPS, Take 2 capsules by mouth daily., Disp: , Rfl:  .  tamsulosin (FLOMAX) 0.4 MG CAPS capsule, Take 0.4 mg by mouth daily. , Disp: , Rfl:  .  timolol (BETIMOL) 0.5 % ophthalmic solution, Place 1 drop into both eyes daily. , Disp: , Rfl:  .  valsartan-hydrochlorothiazide (DIOVAN-HCT) 80-12.5 MG tablet, TAKE 1 TABLET BY MOUTH  DAILY (Patient taking differently: Take 2 tablets by mouth daily.), Disp: 90 tablet, Rfl:  3 .  NON FORMULARY, CBD gummies (Patient not taking: No sig reported), Disp: , Rfl:    Allergies  Allergen Reactions  . Cephalexin     Severe mouth and throat dryness.     Review of Systems  Constitutional: Negative.   Eyes: Negative for blurred vision.  Respiratory: Negative.  Negative for shortness of breath.   Cardiovascular: Negative.  Negative for palpitations.  Gastrointestinal: Negative.   Musculoskeletal: Positive for arthralgias.  Psychiatric/Behavioral: Negative.   All other systems reviewed and are negative.    Today's Vitals   01/23/21  1531  BP: (!) 146/92  Pulse: 75  Temp: 98 F (36.7 C)  TempSrc: Oral  Weight: 164 lb 6.4 oz (74.6 kg)  Height: $Remove'5\' 7"'SEmjMCx$  (1.702 m)  PainSc: 7   PainLoc: Foot   Body mass index is 25.75 kg/m.  Wt Readings from Last 3 Encounters:  01/23/21 164 lb 6.4 oz (74.6 kg)  01/04/21 160 lb (72.6 kg)  08/15/20 163 lb 3.2 oz (74 kg)   Objective:  Physical Exam Vitals and nursing note reviewed.  Constitutional:      Appearance: Normal appearance.  HENT:     Head: Normocephalic and atraumatic.     Nose:     Comments: Masked     Mouth/Throat:     Comments: Masked  Cardiovascular:     Rate and Rhythm: Normal rate and regular rhythm.     Heart sounds: Normal heart sounds.  Pulmonary:     Breath sounds: Normal breath sounds.  Musculoskeletal:        General: Tenderness and deformity present.  Skin:    General: Skin is warm.  Neurological:     General: No focal deficit present.     Mental Status: He is alert and oriented to person, place, and time.         Assessment And Plan:     1. Hypertensive heart and renal disease with renal failure, stage 1 through stage 4 or unspecified chronic kidney disease, without heart failure Comments: Chronic, fair control. He will c/w current meds for now. BP elevation likely exacerbated by joint pain.  - TSH - BMP8+EGFR  2. Stage 3 chronic kidney disease, unspecified whether stage 3a or 3b CKD (HCC) Comments: Chronic. I will check renal function today. Importance of maintaining optimal BP control and adequate hydration to decrease risk of CKD progressoin was d/w pt.   3. Pure hypercholesterolemia Comments: Previous results reviewed in detail.  I will check thyroid function today.  - TSH  4. Generalized osteoarthritis of multiple sites Comments: I will check an arthritis panel. I will consider use of gabapentin to help with chronic pain once his labs are available for review.  - ANA, IFA (with reflex) - CYCLIC CITRUL PEPTIDE ANTIBODY, IGG/IGA -  Rheumatoid factor - Sedimentation rate - Uric acid  5. Paresthesias Comments: Previously vit B12 levels have been normal. Gabapentin may be useful.   6. Atherosclerosis of renal artery ([place]) Comments: He is now willing to consider statin therapy. I will discuss further after review of his meds.    Patient was given opportunity to ask questions. Patient verbalized understanding of the plan and was able to repeat key elements of the plan. All questions were answered to their satisfaction.   I, [name], MD, have reviewed all documentation for this visit. The documentation on 01/29/21 for the exam, diagnosis, procedures, and orders are all accurate and complete.  THE PATIENT IS ENCOURAGED TO PRACTICE SOCIAL DISTANCING  DUE TO THE COVID-19 PANDEMIC.

## 2021-01-23 NOTE — Patient Instructions (Addendum)
Ginger/turmeric tea - drink daily to decrease inflammation  High Cholesterol  High cholesterol is a condition in which the blood has high levels of a white, waxy substance similar to fat (cholesterol). The liver makes all the cholesterol that the body needs. The human body needs small amounts of cholesterol to help build cells. A person gets extra or excess cholesterol from the food that he or she eats. The blood carries cholesterol from the liver to the rest of the body. If you have high cholesterol, deposits (plaques) may build up on the walls of your arteries. Arteries are the blood vessels that carry blood away from your heart. These plaques make the arteries narrow and stiff. Cholesterol plaques increase your risk for heart attack and stroke. Work with your health care provider to keep your cholesterol levels in a healthy range. What increases the risk? The following factors may make you more likely to develop this condition:  Eating foods that are high in animal fat (saturated fat) or cholesterol.  Being overweight.  Not getting enough exercise.  A family history of high cholesterol (familial hypercholesterolemia).  Use of tobacco products.  Having diabetes. What are the signs or symptoms? There are no symptoms of this condition. How is this diagnosed? This condition may be diagnosed based on the results of a blood test.  If you are older than 85 years of age, your health care provider may check your cholesterol levels every 4-6 years.  You may be checked more often if you have high cholesterol or other risk factors for heart disease. The blood test for cholesterol measures:  "Bad" cholesterol, or LDL cholesterol. This is the main type of cholesterol that causes heart disease. The desired level is less than 100 mg/dL.  "Good" cholesterol, or HDL cholesterol. HDL helps protect against heart disease by cleaning the arteries and carrying the LDL to the liver for processing. The  desired level for HDL is 60 mg/dL or higher.  Triglycerides. These are fats that your body can store or burn for energy. The desired level is less than 150 mg/dL.  Total cholesterol. This measures the total amount of cholesterol in your blood and includes LDL, HDL, and triglycerides. The desired level is less than 200 mg/dL. How is this treated? This condition may be treated with:  Diet changes. You may be asked to eat foods that have more fiber and less saturated fats or added sugar.  Lifestyle changes. These may include regular exercise, maintaining a healthy weight, and quitting use of tobacco products.  Medicines. These are given when diet and lifestyle changes have not worked. You may be prescribed a statin medicine to help lower your cholesterol levels. Follow these instructions at home: Eating and drinking  Eat a healthy, balanced diet. This diet includes: ? Daily servings of a variety of fresh, frozen, or canned fruits and vegetables. ? Daily servings of whole grain foods that are rich in fiber. ? Foods that are low in saturated fats and trans fats. These include poultry and fish without skin, lean cuts of meat, and low-fat dairy products. ? A variety of fish, especially oily fish that contain omega-3 fatty acids. Aim to eat fish at least 2 times a week.  Avoid foods and drinks that have added sugar.  Use healthy cooking methods, such as roasting, grilling, broiling, baking, poaching, steaming, and stir-frying. Do not fry your food except for stir-frying.   Lifestyle  Get regular exercise. Aim to exercise for a total of 150 minutes  a week. Increase your activity level by doing activities such as gardening, walking, and taking the stairs.  Do not use any products that contain nicotine or tobacco, such as cigarettes, e-cigarettes, and chewing tobacco. If you need help quitting, ask your health care provider.   General instructions  Take over-the-counter and prescription  medicines only as told by your health care provider.  Keep all follow-up visits as told by your health care provider. This is important. Where to find more information  American Heart Association: www.heart.org  National Heart, Lung, and Blood Institute: https://wilson-eaton.com/ Contact a health care provider if:  You have trouble achieving or maintaining a healthy diet or weight.  You are starting an exercise program.  You are unable to stop smoking. Get help right away if:  You have chest pain.  You have trouble breathing.  You have any symptoms of a stroke. "BE FAST" is an easy way to remember the main warning signs of a stroke: ? B - Balance. Signs are dizziness, sudden trouble walking, or loss of balance. ? E - Eyes. Signs are trouble seeing or a sudden change in vision. ? F - Face. Signs are sudden weakness or numbness of the face, or the face or eyelid drooping on one side. ? A - Arms. Signs are weakness or numbness in an arm. This happens suddenly and usually on one side of the body. ? S - Speech. Signs are sudden trouble speaking, slurred speech, or trouble understanding what people say. ? T - Time. Time to call emergency services. Write down what time symptoms started.  You have other signs of a stroke, such as: ? A sudden, severe headache with no known cause. ? Nausea or vomiting. ? Seizure. These symptoms may represent a serious problem that is an emergency. Do not wait to see if the symptoms will go away. Get medical help right away. Call your local emergency services (911 in the [place] Do not drive yourself to the hospital. Summary  Cholesterol plaques increase your risk for heart attack and stroke. Work with your health care provider to keep your cholesterol levels in a healthy range.  Eat a healthy, balanced diet, get regular exercise, and maintain a healthy weight.  Do not use any products that contain nicotine or tobacco, such as cigarettes, e-cigarettes, and  chewing tobacco.  Get help right away if you have any symptoms of a stroke. This information is not intended to replace advice given to you by your health care provider. Make sure you discuss any questions you have with your health care provider. Document Revised: 10/19/2019 Document Reviewed: 10/19/2019 Elsevier Patient Education  2021 [employer].

## 2021-01-25 LAB — TSH: TSH: 1.94 u[IU]/mL (ref 0.450–4.500)

## 2021-01-25 LAB — BMP8+EGFR
BUN/Creatinine Ratio: 17 (ref 10–24)
BUN: 22 mg/dL (ref 10–36)
CO2: 24 mmol/L (ref 20–29)
Calcium: 9.5 mg/dL (ref 8.6–10.2)
Chloride: 105 mmol/L (ref 96–106)
Creatinine, Ser: 1.3 mg/dL — ABNORMAL HIGH (ref 0.76–1.27)
GFR calc Af Amer: 55 mL/min/{1.73_m2} — ABNORMAL LOW (ref >59)
GFR calc non Af Amer: 47 mL/min/{1.73_m2} — ABNORMAL LOW (ref >59)
Glucose: 88 mg/dL (ref 65–99)
Potassium: 4.6 mmol/L (ref 3.5–5.2)
Sodium: 145 mmol/L — ABNORMAL HIGH (ref 134–144)

## 2021-01-25 LAB — CYCLIC CITRUL PEPTIDE ANTIBODY, IGG/IGA: Cyclic Citrullin Peptide Ab: 8 units (ref 0–19)

## 2021-01-25 LAB — SEDIMENTATION RATE: Sed Rate: 20 mm/hr (ref 0–30)

## 2021-01-25 LAB — ANTINUCLEAR ANTIBODIES, IFA: ANA Titer 1: NEGATIVE

## 2021-01-25 LAB — RHEUMATOID FACTOR: Rheumatoid fact SerPl-aCnc: <10 IU/mL (ref <14.0)

## 2021-01-25 LAB — URIC ACID: Uric Acid: 7.3 mg/dL (ref 3.8–8.4)

## 2021-01-30 ENCOUNTER — Other Ambulatory Visit: Payer: Self-pay

## 2021-01-30 MED ORDER — GABAPENTIN 100 MG PO CAPS: 100.0000 mg | ORAL_CAPSULE | Freq: Every evening | ORAL | 2 refills | Status: DC | PRN

## 2021-02-06 ENCOUNTER — Telehealth: Payer: Self-pay | Admitting: Internal Medicine

## 2021-02-06 NOTE — Telephone Encounter (Signed)
Tried calling patient no answer Please let patient know his appointment has been changed from 01/04/22 to 01/11/22.  Too soon for medicare wellness

## 2021-02-08 ENCOUNTER — Telehealth: Payer: Self-pay

## 2021-02-08 NOTE — Chronic Care Management (AMB) (Signed)
Chronic Care Management Pharmacy Assistant   Name: Wayne [name]  MRN: [id number] DOB: [birth year]   Reason for Encounter: Hypertension Adherence Call   Recent office visits:  01/23/21-Sanders, Bailey Mech, MD ([place]).  Recent consult visits:  None.  Hospital visits:  None in previous 6 months   Medication changes indicated: Famotidine 40 MG Tabs- discontinued due to patient not taking. Famotidine 20 MG Tabs-discontinued due to patient not taking.  Medications: Outpatient Encounter Medications as of 02/08/2021  Medication Sig  . amLODipine (NORVASC) 5 MG tablet TAKE 1 TABLET(5 MG) BY MOUTH DAILY  . aspirin EC 81 MG tablet Take 81 mg by mouth daily.  . B Complex-C (B-COMPLEX WITH VITAMIN C) tablet Take 1 tablet by mouth daily.  . Cholecalciferol (VITAMIN D3) 125 MCG (5000 UT) CAPS Take by mouth daily. Monday, Wednesday, Friday  . fluocinonide cream (LIDEX) 0.93 % Apply 1 application topically 2 (two) times daily.  Marland Kitchen gabapentin (NEURONTIN) 100 MG capsule Take 1 capsule (100 mg total) by mouth at bedtime as needed.  Marland Kitchen ketoconazole (NIZORAL) 2 % cream Apply 1 application topically daily.  Marland Kitchen loratadine (CLARITIN) 10 MG tablet Take 1 tablet (10 mg total) by mouth daily.  . Multiple Vitamin (MULTIVITAMIN WITH MINERALS) TABS tablet Take 1 tablet by mouth daily.  . NON FORMULARY CBD gummies (Patient not taking: No sig reported)  . Omega-3 Fatty Acids (FISH OIL) 1200 MG CAPS Take 1 capsule by mouth daily.   Marland Kitchen oxyCODONE-acetaminophen (PERCOCET/ROXICET) 5-325 MG tablet Take 1-2 tablets by mouth daily as needed for pain.   . Red Yeast Rice Extract (RED YEAST RICE PO) Take 2 capsules by mouth daily.  . Saw Palmetto 450 MG CAPS Take 2 capsules by mouth daily.  . tamsulosin (FLOMAX) 0.4 MG CAPS capsule Take 0.4 mg by mouth daily.   . timolol (BETIMOL) 0.5 % ophthalmic solution Place 1 drop into both eyes daily.   . valsartan-hydrochlorothiazide (DIOVAN-HCT) 80-12.5 MG tablet TAKE 1 TABLET BY MOUTH   DAILY (Patient taking differently: Take 2 tablets by mouth daily.)   No facility-administered encounter medications on file as of 02/08/2021.   Reviewed chart prior to disease state call. Spoke with patient regarding BP  Recent Office Vitals: BP Readings from Last 3 Encounters:  01/23/21 (!) 146/92  08/15/20 124/70  12/30/19 112/66   Pulse Readings from Last 3 Encounters:  01/23/21 75  08/15/20 74  12/30/19 67    Wt Readings from Last 3 Encounters:  01/23/21 164 lb 6.4 oz (74.6 kg)  01/04/21 160 lb (72.6 kg)  08/15/20 163 lb 3.2 oz (74 kg)     Kidney Function Lab Results  Component Value Date/Time   CREATININE 1.30 (H) 01/23/2021 04:36 PM   CREATININE 1.20 08/15/2020 02:51 PM   GFRNONAA 47 (L) 01/23/2021 04:36 PM   GFRAA 55 (L) 01/23/2021 04:36 PM    BMP Latest Ref Rng & Units 01/23/2021 08/15/2020 12/30/2019  Glucose 65 - 99 mg/dL 88 91 100(H)  BUN 10 - 36 mg/dL 22 20 20   Creatinine 0.76 - 1.27 mg/dL 1.30(H) 1.20 1.26  BUN/Creat Ratio 10 - 24 17 17 16   Sodium 134 - 144 mmol/L 145(H) 142 142  Potassium 3.5 - 5.2 mmol/L 4.6 4.1 4.4  Chloride 96 - 106 mmol/L 105 106 104  CO2 20 - 29 mmol/L 24 24 26   Calcium 8.6 - 10.2 mg/dL 9.5 9.3 9.4    . Current antihypertensive regimen:  ? Amlodipine 5mg  daily ? Valsartan/HCTZ 80-12.5mg  daily  .  How often are you checking your Blood Pressure? Patient reports he does not check blood pressure at home but will need to purchase one soon when he can.   . Current home BP readings: None provided at this time.  . What recent interventions/DTPs have been made by any provider to improve Blood Pressure control since last CPP Visit:  ? Recent interventions was to obtain home blood pressure monitor if he begins to feel symptomatic (headaches, dizziness, lightheadedness, etc) Advised patient to he could have blood pressure checked at local pharmacy if needed.  . Any recent hospitalizations or ED visits since last visit with CPP? No   . What  diet changes have been made to improve Blood Pressure Control?  o Patient reports he has not made any changes to improve his blood pressure, but very seldomly uses salt. Patient stated he needs to drink more water. Patient also stated he eats plenty of vegetables.  . What exercise is being done to improve your Blood Pressure Control?  o Patient reports he does his own food shopping with the help from his son and he cleans his house.  Adherence Review: Is the patient currently on ACE/ARB medication? No Does the patient have >5 day gap between last estimated fill dates? No  Recent Relevant Labs: Lab Results  Component Value Date/Time   HGBA1C 5.6 12/30/2019 03:38 PM   HGBA1C 5.5 12/29/2018 12:26 PM    Kidney Function Lab Results  Component Value Date/Time   CREATININE 1.30 (H) 01/23/2021 04:36 PM   CREATININE 1.20 08/15/2020 02:51 PM   GFRNONAA 47 (L) 01/23/2021 04:36 PM   GFRAA 55 (L) 01/23/2021 04:36 PM   02/08/2021 Name: Wayne [name] MRN: [id number] DOB: May 06, 1926 Wayne [name] is a 87 y.o. year old male who is a primary care patient of Glendale Chard, MD.  Comprehensive medication review performed; Spoke to patient regarding cholesterol  Lipid Panel    Component Value Date/Time   CHOL 179 08/15/2020 1451   TRIG 84 08/15/2020 1451   HDL 50 08/15/2020 1451   LDLCALC 113 (H) 08/15/2020 1451    10-year ASCVD risk score: The ASCVD Risk score Mikey Bussing DC Jr., et al., 2013) failed to calculate for the following reasons:   The 2013 ASCVD risk score is only valid for ages 59 to 65  . Current antihyperlipidemic regimen:  ? Omega-3 fatty acids 1200mg  once daily ? Red yeast rice 2 capsules at bedtime  . Previous antihyperlipidemic medications tried: Pravastatin  . ASCVD risk enhancing conditions: age >21, HTN and CKD   . What recent interventions/DTPs have been made by any provider to improve Cholesterol control since last CPP Visit:  - Recent interventions was to exercise as  able with a goal of 30 minutes 5 times weekly (150 minutes per week total), Focus on heart healthy, well-balanced diet.  . Any recent hospitalizations or ED visits since last visit with CPP? No   . What diet changes have been made to improve Cholesterol?  o Patient reports he has not made any changes to improve his cholesterol, but very seldomly uses salt. Patient stated he needs to drink more water. Patient also stated he eats plenty of vegetables.  . What exercise is being done to improve Cholesterol?  o Patient reports he does his own food shopping with the help from his son and he cleans his house.  Adherence Review: Does the patient have >5 day gap between last estimated fill dates? No   Patient reports his  arthritis has been an issue lately. Patient stated he picked up Gabapentin 100 MG Capsules #30 DS- take 1 capsule at bedtime PRN (2 refills left) prescribed from Dr. Baird Cancer.   Follow-up:Pharmacist Review-Scheduled patient for a CCM Call visit on 03/15/21 at 10:00 AM with Orlando Penner, CPP.  Star Rating Drugs: None.  Raynelle Highland, [place] Pharmacist Assistant [phone] CCM Total Time: 49 minutes

## 2021-03-13 ENCOUNTER — Ambulatory Visit (INDEPENDENT_AMBULATORY_CARE_PROVIDER_SITE_OTHER): Payer: Medicare Other | Admitting: Internal Medicine

## 2021-03-13 ENCOUNTER — Other Ambulatory Visit: Payer: Self-pay

## 2021-03-13 ENCOUNTER — Encounter: Payer: Self-pay | Admitting: Internal Medicine

## 2021-03-13 VITALS — BP 146/70 | HR 78 | Temp 98.4°F | Ht 67.0 in | Wt 166.2 lb

## 2021-03-13 DIAGNOSIS — I1310 Hypertensive heart and chronic kidney disease without heart failure, with stage 1 through stage 4 chronic kidney disease, or unspecified chronic kidney disease: Secondary | ICD-10-CM | POA: Diagnosis not present

## 2021-03-13 DIAGNOSIS — M159 Polyosteoarthritis, unspecified: Secondary | ICD-10-CM

## 2021-03-13 DIAGNOSIS — R202 Paresthesia of skin: Secondary | ICD-10-CM

## 2021-03-13 DIAGNOSIS — I701 Atherosclerosis of renal artery: Secondary | ICD-10-CM | POA: Diagnosis not present

## 2021-03-13 DIAGNOSIS — N1831 Chronic kidney disease, stage 3a: Secondary | ICD-10-CM

## 2021-03-13 DIAGNOSIS — N183 Chronic kidney disease, stage 3 unspecified: Secondary | ICD-10-CM

## 2021-03-13 DIAGNOSIS — G8929 Other chronic pain: Secondary | ICD-10-CM | POA: Diagnosis not present

## 2021-03-13 MED ORDER — DICLOFENAC SODIUM 1 % EX GEL: 2.0000 g | Freq: Four times a day (QID) | CUTANEOUS | 1 refills | Status: DC

## 2021-03-13 NOTE — Progress Notes (Signed)
I,Katawbba Wiggins,acting as a Education administrator for [name], MD.,have documented all relevant documentation on the behalf of [name], MD,as directed by  [name], MD while in the presence of [name], MD.  This visit occurred during the SARS-CoV-2 public health emergency.  Safety protocols were in place, including screening questions prior to the visit, additional usage of staff PPE, and extensive cleaning of exam room while observing appropriate contact time as indicated for disinfecting solutions.  Subjective:     Patient ID: Wayne [name] , male    DOB: 1926/02/18 , 87 y.o.   MRN: [id number]   Chief Complaint  Patient presents with  . Gabapentin f/u    HPI  The patient is here today for a follow-up on Gabapentin. This was started at his last visit to address LE paresthesias and chronic arthritic pain. Unfortunately, he has not had any relief from the medication. He is taking 100mg  nightly.     Past Medical History:  Diagnosis Date  . Arthritis   . Cataract   . Glaucoma   . Hypertension      Family History  Problem Relation Age of Onset  . Dementia Mother   . Cancer Father      Current Outpatient Medications:  .  amLODipine (NORVASC) 5 MG tablet, TAKE 1 TABLET(5 MG) BY MOUTH DAILY, Disp: 90 tablet, Rfl: 2 .  aspirin EC 81 MG tablet, Take 81 mg by mouth daily., Disp: , Rfl:  .  B Complex-C (B-COMPLEX WITH VITAMIN C) tablet, Take 1 tablet by mouth daily., Disp: , Rfl:  .  Cholecalciferol (VITAMIN D3) 125 MCG (5000 UT) CAPS, Take by mouth daily. Monday, Wednesday, Friday, Disp: , Rfl:  .  diclofenac Sodium (VOLTAREN) 1 % GEL, Apply 2 g topically 4 (four) times daily., Disp: 150 g, Rfl: 1 .  fluocinonide cream (LIDEX) 4.31 %, Apply 1 application topically 2 (two) times daily., Disp: 30 g, Rfl: 1 .  gabapentin (NEURONTIN) 100 MG capsule, Take 1 capsule (100 mg total) by mouth at bedtime as needed., Disp: 30 capsule, Rfl: 2 .  ketoconazole (NIZORAL) 2 %  cream, Apply 1 application topically daily., Disp: 15 g, Rfl: 1 .  Multiple Vitamin (MULTIVITAMIN WITH MINERALS) TABS tablet, Take 1 tablet by mouth daily., Disp: , Rfl:  .  NON FORMULARY, CBD gummies, Disp: , Rfl:  .  Omega-3 Fatty Acids (FISH OIL) 1200 MG CAPS, Take 1 capsule by mouth daily. , Disp: , Rfl:  .  oxyCODONE-acetaminophen (PERCOCET/ROXICET) 5-325 MG tablet, Take 1-2 tablets by mouth daily as needed for pain. , Disp: , Rfl: 0 .  Red Yeast Rice Extract (RED YEAST RICE PO), Take 2 capsules by mouth daily., Disp: , Rfl:  .  Saw Palmetto 450 MG CAPS, Take 2 capsules by mouth daily., Disp: , Rfl:  .  tamsulosin (FLOMAX) 0.4 MG CAPS capsule, Take 0.4 mg by mouth daily. , Disp: , Rfl:  .  timolol (BETIMOL) 0.5 % ophthalmic solution, Place 1 drop into both eyes daily. , Disp: , Rfl:  .  valsartan-hydrochlorothiazide (DIOVAN-HCT) 80-12.5 MG tablet, TAKE 1 TABLET BY MOUTH  DAILY (Patient taking differently: Take 2 tablets by mouth daily.), Disp: 90 tablet, Rfl: 3 .  loratadine (CLARITIN) 10 MG tablet, Take 1 tablet (10 mg total) by mouth daily., Disp: 90 tablet, Rfl: 2   Allergies  Allergen Reactions  . Cephalexin     Severe mouth and throat dryness.     Review of  Systems  Constitutional: Negative.   Respiratory: Negative.   Cardiovascular: Negative.   Gastrointestinal: Negative.   Musculoskeletal: Positive for arthralgias.  Neurological: Positive for numbness.  Psychiatric/Behavioral: Negative.   All other systems reviewed and are negative.    Today's Vitals   03/13/21 1605  BP: (!) 146/70  Pulse: 78  Temp: 98.4 F (36.9 C)  TempSrc: Oral  Weight: 166 lb 3.2 oz (75.4 kg)  Height: 5\' 7"  (1.702 m)  PainSc: 4   PainLoc: Ankle   Body mass index is 26.03 kg/m.  Wt Readings from Last 3 Encounters:  03/13/21 166 lb 3.2 oz (75.4 kg)  01/23/21 164 lb 6.4 oz (74.6 kg)  01/04/21 160 lb (72.6 kg)   Objective:  Physical Exam Vitals and nursing note reviewed.  Constitutional:       Appearance: Normal appearance.  HENT:     Nose:     Comments: Masked     Mouth/Throat:     Comments: Masked  Cardiovascular:     Rate and Rhythm: Normal rate and regular rhythm.     Heart sounds: Normal heart sounds.  Pulmonary:     Effort: Pulmonary effort is normal.     Breath sounds: Normal breath sounds.  Musculoskeletal:     Comments: Joint deformities, b/l hands  Skin:    General: Skin is warm.  Neurological:     General: No focal deficit present.     Mental Status: He is alert.  Psychiatric:        Mood and Affect: Mood normal.         Assessment And Plan:     1. Paresthesias Comments: Persistent. I will increase gabapentin to 2 capsules nightly. He is encouraged to contact me in two weeks to let me know how he is doing.  2. Generalized osteoarthritis of multiple sites Comments: He was given rx b/l ankle braces as requested. He is also advised to c/w topical use of Voltaren gel.   3. Other chronic pain  4. Hypertensive heart and renal disease with renal failure, stage 1 through stage 4 or unspecified chronic kidney disease, without heart failure Comments: Fair control. This is likely exacerbated by his discomfort. I will not make any changes at this time.   5. Stage 3a chronic kidney disease ([place]) Comments: Chronic, this has been stable.    Patient was given opportunity to ask questions. Patient verbalized understanding of the plan and was able to repeat key elements of the plan. All questions were answered to their satisfaction.   I, [name], MD, have reviewed all documentation for this visit. The documentation on 03/13/21 for the exam, diagnosis, procedures, and orders are all accurate and complete.   IF YOU HAVE BEEN REFERRED TO A SPECIALIST, IT MAY TAKE 1-2 WEEKS TO SCHEDULE/PROCESS THE REFERRAL. IF YOU HAVE NOT HEARD FROM US/SPECIALIST IN TWO WEEKS, PLEASE GIVE [place] A CALL AT [phone].   THE PATIENT IS ENCOURAGED TO PRACTICE SOCIAL  DISTANCING DUE TO THE COVID-19 PANDEMIC.

## 2021-03-13 NOTE — Patient Instructions (Signed)

## 2021-03-15 ENCOUNTER — Ambulatory Visit (INDEPENDENT_AMBULATORY_CARE_PROVIDER_SITE_OTHER): Payer: Medicare Other

## 2021-03-15 DIAGNOSIS — E785 Hyperlipidemia, unspecified: Secondary | ICD-10-CM | POA: Diagnosis not present

## 2021-03-15 DIAGNOSIS — I1310 Hypertensive heart and chronic kidney disease without heart failure, with stage 1 through stage 4 chronic kidney disease, or unspecified chronic kidney disease: Secondary | ICD-10-CM

## 2021-03-15 DIAGNOSIS — I701 Atherosclerosis of renal artery: Secondary | ICD-10-CM

## 2021-03-15 NOTE — Patient Instructions (Signed)
Visit Information It was great speaking with you today!  Please let me know if you have any questions about our visit.  Goals Addressed            This Visit's Progress   . Manage My Medicine       Timeframe:  Long-Range Goal Priority:  High Start Date:                             Expected End Date:                       Follow Up Date 05/20/2021    - call for medicine refill 2 or 3 days before it runs out - call if I am sick and can't take my medicine - use a pillbox to sort medicine - use an alarm clock or phone to remind me to take my medicine    Why is this important?   . These steps will help you keep on track with your medicines.          Patient Care Plan: Pharmacy Care Plan    Problem Identified: HTN, CAD, Chronic Pain   Priority: High    Long-Range Goal: Disease Management   This Visit's Progress: On track  Priority: High  Note:   Current Barriers:  . Unable to independently monitor therapeutic efficacy   Pharmacist Clinical Goal(s):  Marland Kitchen Patient will achieve adherence to monitoring guidelines and medication adherence to achieve therapeutic efficacy through collaboration with PharmD and provider.    Interventions: . 1:1 collaboration with Glendale Chard, MD regarding development and update of comprehensive plan of care as evidenced by provider attestation and co-signature . Inter-disciplinary care team collaboration (see longitudinal plan of care) . Comprehensive medication review performed; medication list updated in electronic medical record  Hypertension (BP goal <140/90) -Controlled -Current treatment: . Amlodipine 5 mg tablet once per day . Valsartan -hydrochlorothiazide  80-12.5 mg tablet once per day   -Current home readings: patient is not currently checking his BP at home.  -Denies hypotensive/hypertensive symptoms -Educated on BP goals and benefits of medications for prevention of heart attack, stroke and kidney damage; Exercise goal of 150  minutes per week; Importance of home blood pressure monitoring; Proper BP monitoring technique;  -Patients believes that his arthritis might be increasing be his BP.  -Patient reports that he is going to purchase a BP cuff  -Counseled to monitor BP at home to check BP at least twice per week document, and provide log at future appointments  -Recommended to continue current medication  Coronary Artery Disease/Hyperlipidemia: (LDL goal < 70) -Uncontrolled -Current treatment: . Aspirin 81 mg tablet daily . Red Yeast Rice -taking 2 capsules daily . Omega-3 Fatty Acids-taking 1 capsule by mouth daily  -Current dietary patterns: eating fish, and chicken rarely eats fried food, fresh fruits and vegetables, and pinto beans  -Current exercise habits: walks and cleans the house -Educated on Cholesterol goals;  Benefits of statin for ASCVD risk reduction; Importance of limiting foods high in cholesterol; -Recommended to continue current medication  -Collaborate with PCP to start patient on statin medication.   Chronic Pain (Goal: pain 2-4) -Uncontrolled  -Current treatment   Gabapentin 100 mg capsule taking 1 by mouth as needed for pain   Is not providing relied   Oxycodone 5-325 mg : taking 1-2 tablets by mouth as needed for pain.   Brings pain down to  a 2-4  -Current pain is in his feet and ankles, he is not in pain now, but once he starts walking and moving around sometimes he has pain.  -Patient reports that he is going to [place] on 04/20/2021 for more resources with pain. -Recommended to continue current medication  - PCP would like to know dosing regimen for Gabapentin given kidney function.  -Sent information to PCP via secure message for   Patient Goals/Self-Care Activities . Patient will:  - check blood pressure 2-3 times per week, document, and provide at future appointments  Follow Up Plan: The patient has been provided with contact information for the care management team  and has been advised to call with any health related questions or concerns.       Patient agreed to services and verbal consent obtained.   The patient verbalized understanding of instructions, educational materials, and care plan provided today and agreed to receive a mailed copy of patient instructions, educational materials, and care plan.   Orlando Penner, PharmD Clinical Pharmacist [facility] [phone]

## 2021-03-15 NOTE — Progress Notes (Signed)
Chronic Care Management Pharmacy Note  03/15/2021 Name:  Wayne [name] MRN:  [id number] DOB:  [birth year]  Subjective: Wayne [name] is an 87 y.o. year old male who is a primary patient of Glendale Chard, MD.  The CCM team was consulted for assistance with disease management and care coordination needs.  Patient reports that he is going to [employer] for a device for his ankle, shoulder back they provide support. He reports he lost his wife 02/24/1986.   Engaged with patient by telephone for follow up visit in response to provider referral for pharmacy case management and/or care coordination services.   Consent to Services:  The patient was given information about Chronic Care Management services, agreed to services, and gave verbal consent prior to initiation of services.  Please see initial visit note for detailed documentation.   Patient Care Team: Glendale Chard, MD as PCP - General (Internal Medicine) Warden Fillers, MD as Consulting Physician (Ophthalmology) Cyril Mourning, [facility] (Inactive) (Pharmacist)  Recent office visits: 01/23/2021 - PCP OV  Recent consult visits: 12/29/2020 [facility] visits: None in previous 6 months  Objective:  Lab Results  Component Value Date   CREATININE 1.30 (H) 01/23/2021   BUN 22 01/23/2021   GFRNONAA 47 (L) 01/23/2021   GFRAA 55 (L) 01/23/2021   NA 145 (H) 01/23/2021   K 4.6 01/23/2021   CALCIUM 9.5 01/23/2021   CO2 24 01/23/2021   GLUCOSE 88 01/23/2021    Lab Results  Component Value Date/Time   HGBA1C 5.6 12/30/2019 03:38 PM   HGBA1C 5.5 12/29/2018 12:26 PM    Last diabetic Eye exam: No results found for: HMDIABEYEEXA  Last diabetic Foot exam: No results found for: HMDIABFOOTEX   Lab Results  Component Value Date   CHOL 179 08/15/2020   HDL 50 08/15/2020   LDLCALC 113 (H) 08/15/2020   TRIG 84 08/15/2020   CHOLHDL 3.6 08/15/2020    Hepatic Function Latest Ref Rng & Units 12/30/2019 12/29/2018  08/10/2017  Total Protein 6.0 - 8.5 g/dL 7.2 6.8 7.2  Albumin 3.5 - 4.6 g/dL 4.2 4.1 3.6  AST 0 - 40 IU/L '23 18 31  ' ALT 0 - 44 IU/L '14 14 18  ' Alk Phosphatase 39 - 117 IU/L 108 102 70  Total Bilirubin 0.0 - 1.2 mg/dL 0.2 0.4 0.7    Lab Results  Component Value Date/Time   TSH 1.940 01/23/2021 04:36 PM   TSH 0.999 11/22/2015 06:27 PM   TSH 0.928 03/31/2014 04:43 PM    CBC Latest Ref Rng & Units 12/30/2019 06/30/2019 08/10/2017  WBC 3.4 - 10.8 x10E3/uL 3.9 3.9 4.7  Hemoglobin 13.0 - 17.7 g/dL 13.4 13.3 13.0  Hematocrit 37.5 - 51.0 % 39.9 40.2 38.9(L)  Platelets 150 - 450 x10E3/uL 251 226 212    Lab Results  Component Value Date/Time   VD25OH 37.5 06/30/2019 10:54 AM    Clinical ASCVD: Yes  The ASCVD Risk score Mikey Bussing DC Jr., et al., 2013) failed to calculate for the following reasons:   The 2013 ASCVD risk score is only valid for ages 70 to 3    Depression screen PHQ 2/9 01/04/2021 12/30/2019 03/31/2019  Decreased Interest 0 0 0  Down, Depressed, Hopeless 0 0 0  PHQ - 2 Score 0 0 0  Altered sleeping - 3 -  Tired, decreased energy - 0 -  Change in appetite - 0 -  Feeling bad or failure about yourself  - 0 -  Trouble concentrating -  0 -  Moving slowly or fidgety/restless - 0 -  Suicidal thoughts - 0 -  PHQ-9 Score - 3 -  Difficult doing work/chores - Not difficult at all -      Social History   Tobacco Use  Smoking Status Former Smoker  . Packs/day: 0.25  . Years: 3.00  . Pack years: 0.75  . Types: Cigarettes  . Quit date: 06/02/1972  . Years since quitting: 48.8  Smokeless Tobacco Never Used   BP Readings from Last 3 Encounters:  03/13/21 (!) 146/70  01/23/21 (!) 146/92  08/15/20 124/70   Pulse Readings from Last 3 Encounters:  03/13/21 78  01/23/21 75  08/15/20 74   Wt Readings from Last 3 Encounters:  03/13/21 166 lb 3.2 oz (75.4 kg)  01/23/21 164 lb 6.4 oz (74.6 kg)  01/04/21 160 lb (72.6 kg)   BMI Readings from Last 3 Encounters:  03/13/21 26.03 kg/m   01/23/21 25.75 kg/m  01/04/21 25.06 kg/m    Assessment/Interventions: Review of patient past medical history, allergies, medications, health status, including review of consultants reports, laboratory and other test data, was performed as part of comprehensive evaluation and provision of chronic care management services.   SDOH:  (Social Determinants of Health) assessments and interventions performed: No  SDOH Screenings   Alcohol Screen: Not on file  Depression (PHQ2-9): Low Risk   . PHQ-2 Score: 0  Financial Resource Strain: Low Risk   . Difficulty of Paying Living Expenses: Not hard at all  Food Insecurity: No Food Insecurity  . Worried About Charity fundraiser in the Last Year: Never true  . Ran Out of Food in the Last Year: Never true  Housing: Not on file  Physical Activity: Inactive  . Days of Exercise per Week: 0 days  . Minutes of Exercise per Session: 0 min  Social Connections: Not on file  Stress: No Stress Concern Present  . Feeling of Stress : Not at all  Tobacco Use: Medium Risk  . Smoking Tobacco Use: Former Smoker  . Smokeless Tobacco Use: Never Used  Transportation Needs: No Transportation Needs  . Lack of Transportation (Medical): No  . Lack of Transportation (Non-Medical): No    CCM Care Plan  Allergies  Allergen Reactions  . Cephalexin     Severe mouth and throat dryness.    Medications Reviewed Today    Reviewed by Glendale Chard, MD (Physician) on 03/13/21 at 1643  Med List Status: <None>  Medication Order Taking? Sig Documenting Provider Last Dose Status Informant  amLODipine (NORVASC) 5 MG tablet [id number] Yes TAKE 1 TABLET(5 MG) BY MOUTH DAILY Glendale Chard, MD Taking Active   aspirin EC 81 MG tablet [id number] Yes Take 81 mg by mouth daily. [provider] Taking Active Self  B Complex-C (B-COMPLEX WITH VITAMIN C) tablet [id number] Yes Take 1 tablet by mouth daily. [provider] Taking Active   Cholecalciferol (VITAMIN  D3) 125 MCG (5000 UT) CAPS [id number] Yes Take by mouth daily. Monday, Wednesday, Friday [provider] Taking Active   fluocinonide cream (LIDEX) 0.05 % [id number] Yes Apply 1 application topically 2 (two) times daily. Glendale Chard, MD Taking Active   gabapentin (NEURONTIN) 100 MG capsule [id number] Yes Take 1 capsule (100 mg total) by mouth at bedtime as needed. Glendale Chard, MD Taking Active   ketoconazole (NIZORAL) 2 % cream [id number] Yes Apply 1 application topically daily. Glendale Chard, MD Taking Active   loratadine (CLARITIN) 10 MG tablet [id number]  Take 1  tablet (10 mg total) by mouth daily. Glendale Chard, MD  Expired 08/15/20 2359   Multiple Vitamin (MULTIVITAMIN WITH MINERALS) TABS tablet [id number] Yes Take 1 tablet by mouth daily. [provider] Taking Active Self  Baruch Gouty [id number] Yes CBD gummies [provider] Taking Active   Omega-3 Fatty Acids (FISH OIL) 1200 MG CAPS [id number] Yes Take 1 capsule by mouth daily.  [provider] Taking Active Self  oxyCODONE-acetaminophen (PERCOCET/ROXICET) 5-325 MG tablet [id number] Yes Take 1-2 tablets by mouth daily as needed for pain.  [provider] Taking Active Self  Red Yeast Rice Extract (RED YEAST RICE PO) [id number] Yes Take 2 capsules by mouth daily. [provider] Taking Active   Saw Palmetto 450 MG CAPS [id number] Yes Take 2 capsules by mouth daily. [provider] Taking Active   tamsulosin (FLOMAX) 0.4 MG CAPS capsule [id number] Yes Take 0.4 mg by mouth daily.  [provider] Taking Active   timolol (BETIMOL) 0.5 % ophthalmic solution [id number] Yes Place 1 drop into both eyes daily.  [provider] Taking Active Self  valsartan-hydrochlorothiazide (DIOVAN-HCT) 80-12.5 MG tablet [id number] Yes TAKE 1 TABLET BY MOUTH  DAILY  Patient taking differently: Take 2 tablets by mouth daily.   Glendale Chard, MD Taking Active           Patient Active  Problem List   Diagnosis Date Noted  . Renal artery atherosclerosis ([place]) 12/30/2019  . Viral upper respiratory tract infection 03/09/2019  . Hearing loss due to cerumen impaction, bilateral 12/04/2018  . Acute back pain 06/10/2018  . Spondylosis of lumbar region without myelopathy or radiculopathy 06/10/2018  . Carpal tunnel syndrome of left wrist 12/12/2017  . [place]oe 07/01/2016  . Anemia of chronic renal failure, stage 3 (moderate) ([place]) 11/23/2015  . Dehydration 11/22/2015  . CKD (chronic kidney disease) stage 3, GFR 30-59 ml/min (H[facility]) 11/22/2015  . Sepsis ([place]) 11/22/2015  . Diarrhea 11/22/2015  . Leukopenia 11/22/2015  . Benign essential HTN 11/22/2015  . Coronary artery disease involving native coronary artery without angina pectoris 03/18/2012  . Hyperlipidemia 03/18/2012    Immunization History  Administered Date(s) Administered  . Fluad Quad(high Dose 65+) 08/07/2019, 08/15/2020  . Influenza, High Dose Seasonal PF 09/06/2017  . Influenza-Unspecified 08/04/2018  . Moderna SARS-COV2 Booster Vaccination 09/30/2020  . Moderna Sars-Covid-2 Vaccination 12/27/2019, 01/24/2020  . Pneumococcal Conjugate-13 08/27/2019  . Pneumococcal-Unspecified 06/18/2011  . Zoster Recombinat (Shingrix) 08/27/2019    Conditions to be addressed/monitored:  Hypertension, Hyperlipidemia and Coronary Artery Disease  Care Plan : Pharmacy Care Plan  Updates made by Mayford Knife, RPH since 03/15/2021 12:00 AM    Problem: HTN, CAD, Chronic Pain   Priority: High    Long-Range Goal: Disease Management   This Visit's Progress: On track  Priority: High  Note:   Current Barriers:  . Unable to independently monitor therapeutic efficacy   Pharmacist Clinical Goal(s):  Marland Kitchen Patient will achieve adherence to monitoring guidelines and medication adherence to achieve therapeutic efficacy through collaboration with PharmD and provider.    Interventions: . 1:1 collaboration with Glendale Chard,  MD regarding development and update of comprehensive plan of care as evidenced by provider attestation and co-signature . Inter-disciplinary care team collaboration (see longitudinal plan of care) . Comprehensive medication review performed; medication list updated in electronic medical record  Hypertension (BP goal <140/90) -Controlled -Current treatment: . Amlodipine 5 mg tablet once per day . Valsartan -hydrochlorothiazide  80-12.5 mg tablet once per day   -Current home  readings: patient is not currently checking his BP at home.  -Denies hypotensive/hypertensive symptoms -Educated on BP goals and benefits of medications for prevention of heart attack, stroke and kidney damage; Exercise goal of 150 minutes per week; Importance of home blood pressure monitoring; Proper BP monitoring technique;  -Patients believes that his arthritis might be increasing be his BP.  -Patient reports that he is going to purchase a BP cuff  -Counseled to monitor BP at home to check BP at least twice per week document, and provide log at future appointments  -Recommended to continue current medication  Coronary Artery Disease/Hyperlipidemia: (LDL goal < 70) -Uncontrolled -Current treatment: . Aspirin 81 mg tablet daily . Red Yeast Rice -taking 2 capsules daily . Omega-3 Fatty Acids-taking 1 capsule by mouth daily  -Current dietary patterns: eating fish, and chicken rarely eats fried food, fresh fruits and vegetables, and pinto beans  -Current exercise habits: walks and cleans the house -Educated on Cholesterol goals;  Benefits of statin for ASCVD risk reduction; Importance of limiting foods high in cholesterol; -Recommended to continue current medication  -Collaborate with PCP to start patient on statin medication.   Chronic Pain (Goal: pain 2-4) -Uncontrolled  -Current treatment   Gabapentin 100 mg capsule taking 1 by mouth as needed for pain   Is not providing relied   Oxycodone 5-325 mg :  taking 1-2 tablets by mouth as needed for pain.   Brings pain down to a 2-4  -Current pain is in his feet and ankles, he is not in pain now, but once he starts walking and moving around sometimes he has pain.  -Patient reports that he is going to [employer] on 04/20/2021 for more resources with pain. -Recommended to continue current medication  - PCP would like to know dosing regimen for Gabapentin given kidney function.  -Sent information to PCP via secure message for   Patient Goals/Self-Care Activities . Patient will:  - check blood pressure 2-3 times per week, document, and provide at future appointments  Follow Up Plan: The patient has been provided with contact information for the care management team and has been advised to call with any health related questions or concerns.       Medication Assistance: None required.  Patient affirms current coverage meets needs.  Patient's preferred pharmacy is:  [facility], [address], [address] [place], [place] [address] [place] [address] Phone: [phone] Fax: [phone]  [facility] [id number] - [place], [address] [place] [address] Phone: [phone] Fax: [phone]  Uses pill box? Yes Pt endorses 90% compliance  We discussed: Current pharmacy is preferred with insurance plan and patient is satisfied with pharmacy services Patient decided to: Continue current medication management strategy  Care Plan and Follow Up Patient Decision:  Patient agrees to Care Plan and Follow-up.  Plan: The patient has been provided with contact information for the care management team and has been advised to call with any health related questions or concerns.   Orlando Penner, PharmD Clinical Pharmacist [facility] [phone]

## 2021-04-04 DIAGNOSIS — N401 Enlarged prostate with lower urinary tract symptoms: Secondary | ICD-10-CM | POA: Diagnosis not present

## 2021-04-04 DIAGNOSIS — R351 Nocturia: Secondary | ICD-10-CM | POA: Diagnosis not present

## 2021-04-27 ENCOUNTER — Telehealth: Payer: Self-pay

## 2021-04-27 NOTE — Chronic Care Management (AMB) (Signed)
    Chronic Care Management Pharmacy Assistant   Name: Wayne [name]  MRN: [id number] DOB: 1926/07/01   Reason for Encounter: Disease State/ Hypertension   Recent office visits:  None  Recent consult visits:  None  Hospital visits:  None in previous 6 months  Medications: Outpatient Encounter Medications as of 04/27/2021  Medication Sig  . amLODipine (NORVASC) 5 MG tablet TAKE 1 TABLET(5 MG) BY MOUTH DAILY  . aspirin EC 81 MG tablet Take 81 mg by mouth daily.  . B Complex-C (B-COMPLEX WITH VITAMIN C) tablet Take 1 tablet by mouth daily.  . Cholecalciferol (VITAMIN D3) 125 MCG (5000 UT) CAPS Take by mouth daily. Monday, Wednesday, Friday  . diclofenac Sodium (VOLTAREN) 1 % GEL Apply 2 g topically 4 (four) times daily.  . fluocinonide cream (LIDEX) 1.01 % Apply 1 application topically 2 (two) times daily.  Marland Kitchen gabapentin (NEURONTIN) 100 MG capsule Take 1 capsule (100 mg total) by mouth at bedtime as needed.  Marland Kitchen ketoconazole (NIZORAL) 2 % cream Apply 1 application topically daily.  Marland Kitchen loratadine (CLARITIN) 10 MG tablet Take 1 tablet (10 mg total) by mouth daily.  . Multiple Vitamin (MULTIVITAMIN WITH MINERALS) TABS tablet Take 1 tablet by mouth daily.  . NON FORMULARY CBD gummies  . Omega-3 Fatty Acids (FISH OIL) 1200 MG CAPS Take 1 capsule by mouth daily.   Marland Kitchen oxyCODONE-acetaminophen (PERCOCET/ROXICET) 5-325 MG tablet Take 1-2 tablets by mouth daily as needed for pain.   . Red Yeast Rice Extract (RED YEAST RICE PO) Take 2 capsules by mouth daily.  . Saw Palmetto 450 MG CAPS Take 2 capsules by mouth daily.  . tamsulosin (FLOMAX) 0.4 MG CAPS capsule Take 0.4 mg by mouth daily.   . timolol (BETIMOL) 0.5 % ophthalmic solution Place 1 drop into both eyes daily.   . valsartan-hydrochlorothiazide (DIOVAN-HCT) 80-12.5 MG tablet TAKE 1 TABLET BY MOUTH  DAILY (Patient taking differently: Take 2 tablets by mouth daily.)   No facility-administered encounter medications on file as of  04/27/2021.   Reviewed chart prior to disease state call. Spoke with patient regarding BP  Recent Office Vitals: BP Readings from Last 3 Encounters:  03/13/21 (!) 146/70  01/23/21 (!) 146/92  08/15/20 124/70   Pulse Readings from Last 3 Encounters:  03/13/21 78  01/23/21 75  08/15/20 74    Wt Readings from Last 3 Encounters:  03/13/21 166 lb 3.2 oz (75.4 kg)  01/23/21 164 lb 6.4 oz (74.6 kg)  01/04/21 160 lb (72.6 kg)      04-27-2021: 1st attempt left VM on home line. No VM on cell phone 05-02-2021: 2nd attempt left VM on home line. No VM on cell phone. LVM with son 05-05-2021: 3rd attempt unable to leave VM on both lines. Left VM with son.  Star Rating Drugs: Valsartan/HCTZ 80-12.5mg - Last filled 12-29-2020 [facility] Pharmacist Assistant [phone]

## 2021-05-02 ENCOUNTER — Other Ambulatory Visit: Payer: Self-pay

## 2021-05-02 DIAGNOSIS — Z1152 Encounter for screening for COVID-19: Secondary | ICD-10-CM

## 2021-05-03 ENCOUNTER — Telehealth: Payer: Self-pay

## 2021-05-03 ENCOUNTER — Other Ambulatory Visit: Payer: Self-pay

## 2021-05-03 DIAGNOSIS — U071 COVID-19: Secondary | ICD-10-CM

## 2021-05-03 LAB — NOVEL CORONAVIRUS, NAA: SARS-CoV-2, NAA: DETECTED — AB

## 2021-05-03 LAB — SARS-COV-2, NAA 2 DAY TAT

## 2021-05-03 NOTE — Telephone Encounter (Signed)
The patient was notified that he is positive for covid and that Dr Baird Cancer said he will get a call tomorrow about treatment.

## 2021-05-04 ENCOUNTER — Telehealth: Payer: Self-pay

## 2021-05-04 ENCOUNTER — Telehealth: Payer: Self-pay | Admitting: Physician Assistant

## 2021-05-04 NOTE — Telephone Encounter (Signed)
Called to discuss with patient about COVID-19 symptoms and the use of one of the available treatments for those with mild to moderate Covid symptoms and at a high risk of hospitalization.  Pt appears to qualify for outpatient treatment due to co-morbid conditions and/or a member of an at-risk group in accordance with the FDA Emergency Use Authorization.    Symptom onset: 04/26/21  Pt is not qualified for the monoclonal antibody infusion or oral anti viral drug due symptoms onset. Preventative practices reviewed. Patient verbalized understanding.  [place], [place]  05/04/2021 10:12 AM

## 2021-05-04 NOTE — Telephone Encounter (Signed)
Called to discuss with patient about COVID-19 symptoms and the use of one of the available treatments for those with mild to moderate Covid symptoms and at a high risk of hospitalization.  Pt appears to qualify for outpatient treatment due to co-morbid conditions and/or a member of an at-risk group in accordance with the FDA Emergency Use Authorization.    Symptom onset: Runny nose, cough 04/26/21 per pt. Vaccinated: Yes Booster? Yes Immunocompromised? No Qualifiers: HTN,CAD NIH Criteria: Tier 1  Instructed pt. He is out of window of treatment for anti-virals. Request  To speak with APP "just in case."  Marcello Moores

## 2021-05-15 ENCOUNTER — Telehealth: Payer: Self-pay

## 2021-05-15 NOTE — Chronic Care Management (AMB) (Signed)
  Patient aware of telephone appointment with Orlando Penner on 05-16-2021 at 10:30. Patient aware to have/bring all medications, supplements, blood pressure and/or blood sugar logs to visit.  Questions: Have you had any recent office visit or specialist visit outside of [facility]? Patient states No  Are there any concerns you would like to discuss during your office visit? Patient stated he would like to discuss his arthritis issues.  Are you having any problems obtaining your medications? (Whether it pharmacy issues or cost) If patient has any PAP medications ask if they are having any problems getting their PAP medication or refill?  Star Rating Drug: Valsartan/HCTZ 80-12.5mg - Last filled 12-29-2020 [facility]   Any gaps in medications fill history? Yes   [place] Pharmacist Assistant [phone]

## 2021-05-16 ENCOUNTER — Telehealth: Payer: Self-pay

## 2021-05-25 ENCOUNTER — Telehealth: Payer: Self-pay

## 2021-05-25 NOTE — Telephone Encounter (Signed)
I left the pt a message that I was calling to schedule him an appt. The pt left a message that he needed some help around the house, like an aid.

## 2021-05-26 ENCOUNTER — Other Ambulatory Visit: Payer: Self-pay | Admitting: Internal Medicine

## 2021-05-30 ENCOUNTER — Other Ambulatory Visit: Payer: Self-pay

## 2021-05-30 ENCOUNTER — Encounter: Payer: Self-pay | Admitting: Nurse Practitioner

## 2021-05-30 ENCOUNTER — Ambulatory Visit (INDEPENDENT_AMBULATORY_CARE_PROVIDER_SITE_OTHER): Payer: Medicare Other | Admitting: Nurse Practitioner

## 2021-05-30 VITALS — BP 132/68 | HR 70 | Temp 98.7°F | Ht 63.4 in | Wt 158.2 lb

## 2021-05-30 DIAGNOSIS — E785 Hyperlipidemia, unspecified: Secondary | ICD-10-CM | POA: Diagnosis not present

## 2021-05-30 DIAGNOSIS — M159 Polyosteoarthritis, unspecified: Secondary | ICD-10-CM

## 2021-05-30 DIAGNOSIS — Z6827 Body mass index (BMI) 27.0-27.9, adult: Secondary | ICD-10-CM

## 2021-05-30 DIAGNOSIS — I1310 Hypertensive heart and chronic kidney disease without heart failure, with stage 1 through stage 4 chronic kidney disease, or unspecified chronic kidney disease: Secondary | ICD-10-CM

## 2021-05-30 DIAGNOSIS — E663 Overweight: Secondary | ICD-10-CM | POA: Diagnosis not present

## 2021-05-30 LAB — CMP14+EGFR
ALT: 14 IU/L (ref 0–44)
AST: 21 IU/L (ref 0–40)
Albumin/Globulin Ratio: 1.6 (ref 1.2–2.2)
Albumin: 4.1 g/dL (ref 3.5–4.6)
Alkaline Phosphatase: 93 IU/L (ref 44–121)
BUN/Creatinine Ratio: 15 (ref 10–24)
BUN: 21 mg/dL (ref 10–36)
Bilirubin Total: 0.4 mg/dL (ref 0.0–1.2)
CO2: 23 mmol/L (ref 20–29)
Calcium: 9.4 mg/dL (ref 8.6–10.2)
Chloride: 104 mmol/L (ref 96–106)
Creatinine, Ser: 1.37 mg/dL — ABNORMAL HIGH (ref 0.76–1.27)
Globulin, Total: 2.6 g/dL (ref 1.5–4.5)
Glucose: 143 mg/dL — ABNORMAL HIGH (ref 65–99)
Potassium: 3.9 mmol/L (ref 3.5–5.2)
Sodium: 142 mmol/L (ref 134–144)
Total Protein: 6.7 g/dL (ref 6.0–8.5)
eGFR: 48 mL/min/{1.73_m2} — ABNORMAL LOW (ref >59)

## 2021-05-30 LAB — LIPID PANEL
Chol/HDL Ratio: 3.3 ratio (ref 0.0–5.0)
Cholesterol, Total: 173 mg/dL (ref 100–199)
HDL: 52 mg/dL (ref >39)
LDL Chol Calc (NIH): 107 mg/dL — ABNORMAL HIGH (ref 0–99)
Triglycerides: 74 mg/dL (ref 0–149)
VLDL Cholesterol Cal: 14 mg/dL (ref 5–40)

## 2021-05-30 LAB — CBC
Hematocrit: 36.6 % — ABNORMAL LOW (ref 37.5–51.0)
Hemoglobin: 12.6 g/dL — ABNORMAL LOW (ref 13.0–17.7)
MCH: 33.6 pg — ABNORMAL HIGH (ref 26.6–33.0)
MCHC: 34.4 g/dL (ref 31.5–35.7)
MCV: 98 fL — ABNORMAL HIGH (ref 79–97)
Platelets: 199 10*3/uL (ref 150–450)
RBC: 3.75 x10E6/uL — ABNORMAL LOW (ref 4.14–5.80)
RDW: 12.7 % (ref 11.6–15.4)
WBC: 4.6 10*3/uL (ref 3.4–10.8)

## 2021-05-30 NOTE — Progress Notes (Signed)
I,Tianna Badgett,acting as a Education administrator for [employer], NP.,have documented all relevant documentation on the behalf of [employer], NP,as directed by  Bary Castilla, NP while in the presence of Bary Castilla, NP.  This visit occurred during the SARS-CoV-2 public health emergency.  Safety protocols were in place, including screening questions prior to the visit, additional usage of staff PPE, and extensive cleaning of exam room while observing appropriate contact time as indicated for disinfecting solutions.  Subjective:     Patient ID: Wayne [name] , male    DOB: 1925-12-11 , 87 y.o.   MRN: [id number]   Chief Complaint  Patient presents with   Home Health Evaluation    HPI  Patient is here for evaluation for nursing services for home health. He is doing good. He doesn't eat a lot of meat. He states it is getting harder for him to clean and do things around the house. He needs help due to his pain and mobility issues.  He has a lot of pain from his arthritis. No other concerns.     Past Medical History:  Diagnosis Date   Arthritis    Cataract    Glaucoma    Hypertension      Family History  Problem Relation Age of Onset   Dementia Mother    Cancer Father      Current Outpatient Medications:    amLODipine (NORVASC) 5 MG tablet, TAKE 1 TABLET BY MOUTH  DAILY, Disp: 90 tablet, Rfl: 3   aspirin EC 81 MG tablet, Take 81 mg by mouth daily., Disp: , Rfl:    B Complex-C (B-COMPLEX WITH VITAMIN C) tablet, Take 1 tablet by mouth daily., Disp: , Rfl:    Cholecalciferol (VITAMIN D3) 125 MCG (5000 UT) CAPS, Take by mouth daily. Monday, Wednesday, Friday, Disp: , Rfl:    diclofenac Sodium (VOLTAREN) 1 % GEL, Apply 2 g topically 4 (four) times daily., Disp: 150 g, Rfl: 1   fluocinonide cream (LIDEX) 2.94 %, Apply 1 application topically 2 (two) times daily., Disp: 30 g, Rfl: 1   gabapentin (NEURONTIN) 100 MG capsule, Take 1 capsule (100 mg total) by mouth at bedtime as  needed., Disp: 30 capsule, Rfl: 2   ketoconazole (NIZORAL) 2 % cream, Apply 1 application topically daily., Disp: 15 g, Rfl: 1   loratadine (CLARITIN) 10 MG tablet, Take 1 tablet (10 mg total) by mouth daily., Disp: 90 tablet, Rfl: 2   Multiple Vitamin (MULTIVITAMIN WITH MINERALS) TABS tablet, Take 1 tablet by mouth daily., Disp: , Rfl:    NON FORMULARY, CBD gummies, Disp: , Rfl:    Omega-3 Fatty Acids (FISH OIL) 1200 MG CAPS, Take 1 capsule by mouth daily. , Disp: , Rfl:    oxyCODONE-acetaminophen (PERCOCET/ROXICET) 5-325 MG tablet, Take 1-2 tablets by mouth daily as needed for pain. , Disp: , Rfl: 0   Red Yeast Rice Extract (RED YEAST RICE PO), Take 2 capsules by mouth daily., Disp: , Rfl:    Saw Palmetto 450 MG CAPS, Take 2 capsules by mouth daily., Disp: , Rfl:    tamsulosin (FLOMAX) 0.4 MG CAPS capsule, Take 0.4 mg by mouth daily. , Disp: , Rfl:    timolol (BETIMOL) 0.5 % ophthalmic solution, Place 1 drop into both eyes daily. , Disp: , Rfl:    valsartan-hydrochlorothiazide (DIOVAN-HCT) 80-12.5 MG tablet, TAKE 1 TABLET BY MOUTH  DAILY (Patient taking differently: Take 2 tablets by mouth daily.), Disp: 90 tablet, Rfl: 3   Allergies  Allergen Reactions  Cephalexin     Severe mouth and throat dryness.     Review of Systems  Constitutional: Negative.  Negative for chills, fatigue and fever.  HENT:  Negative for congestion and sneezing.        Hard of hearing   Respiratory: Negative.  Negative for cough, shortness of breath and wheezing.   Cardiovascular: Negative.  Negative for chest pain and palpitations.  Gastrointestinal: Negative.  Negative for constipation and diarrhea.  Musculoskeletal:  Negative for arthralgias and myalgias.  Neurological: Negative.  Negative for dizziness, weakness, numbness and headaches.    Today's Vitals   05/30/21 1422  BP: 132/68  Pulse: 70  Temp: 98.7 F (37.1 C)  TempSrc: Oral  Weight: 158 lb 3.2 oz (71.8 kg)  Height: 5' 3.4" (1.61 m)   Body  mass index is 27.67 kg/m.  Wt Readings from Last 3 Encounters:  05/30/21 158 lb 3.2 oz (71.8 kg)  03/13/21 166 lb 3.2 oz (75.4 kg)  01/23/21 164 lb 6.4 oz (74.6 kg)    Objective:  Physical Exam Constitutional:      Appearance: Normal appearance.  HENT:     Head: Normocephalic and atraumatic.     Right Ear: Tympanic membrane normal.     Left Ear: Tympanic membrane normal.  Cardiovascular:     Rate and Rhythm: Normal rate and regular rhythm.     Pulses: Normal pulses.     Heart sounds: Normal heart sounds. No murmur heard. Pulmonary:     Effort: Pulmonary effort is normal. No respiratory distress.     Breath sounds: Normal breath sounds. No wheezing.  Skin:    General: Skin is warm and dry.     Capillary Refill: Capillary refill takes less than 2 seconds.  Neurological:     Mental Status: He is alert.        Assessment And Plan:     1. Hyperlipidemia, unspecified hyperlipidemia type --Educated patient about a diet that is low in fat and high fatty foods including dairy products. Increase in take of fish and fiber. Decrease intake of red meats and fast foods. Exercise for atleast 4-5 times a week or atleast 30-45 min. Drink a lot of water.   - Lipid panel  2. Hypertensive heart and renal disease with renal failure, stage 1 through stage 4 or unspecified chronic kidney disease, without heart failure -Limit the intake of processed foods and salt intake. You should increase your intake of green vegetables and fruits. Limit the use of alcohol. Limit fast foods and fried foods. Avoid high fatty saturated and trans fat foods. Keep yourself hydrated with drinking water. Avoid red meats. Eat lean meats instead. Exercise for atleast 30-45 min for atleast 4-5 times a week.  - CBC no Diff - CMP14+EGFR  3. Generalized osteoarthritis of multiple sites -Patient needs help with cleaning and doing ADL's at home. -Referrel has been sent for home health aid. To come in and help him clean and  do daily chorus.  - Ambulatory referral to [facility]  4. Overweight with body mass index (BMI) of 27 to 27.9 in adult Advised patient on a healthy diet including avoiding fast food and red meats. Increase the intake of lean meats including grilled chicken and [place].  Drink a lot of water. Decrease intake of fatty foods. Exercise for 30-45 min. 4-5 a week to decrease the risk of cardiac event.   The patient was encouraged to call or send a message through [place] for any questions or  concerns.   Follow up: if symptoms persist or do not get better.   Patient was given opportunity to ask questions. Patient verbalized understanding of the plan and was able to repeat key elements of the plan. All questions were answered to their satisfaction.  Raman Preslyn Warr, DNP   I, Raman Trinidee Schrag have reviewed all documentation for this visit. The documentation on 05/30/21 for the exam, diagnosis, procedures, and orders are all accurate and complete.    IF YOU HAVE BEEN REFERRED TO A SPECIALIST, IT MAY TAKE 1-2 WEEKS TO SCHEDULE/PROCESS THE REFERRAL. IF YOU HAVE NOT HEARD FROM US/SPECIALIST IN TWO WEEKS, PLEASE GIVE [place] A CALL AT [phone].   THE PATIENT IS ENCOURAGED TO PRACTICE SOCIAL DISTANCING DUE TO THE COVID-19 PANDEMIC.

## 2021-05-30 NOTE — Patient Instructions (Signed)
Arthritis Arthritis means joint pain. It can also mean joint disease. A joint is a placewhere bones come together. There are more than 100 types of arthritis. What are the causes? This condition may be caused by: Wear and tear of a joint. This is the most common cause. A lot of acid in the blood, which leads to pain in the joint (gout). Pain and swelling (inflammation) in a joint. Infection of a joint. Injuries in the joint. A reaction to medicines (allergy). In some cases, the cause may not be known. What are the signs or symptoms? Symptoms of this condition include: Redness at a joint. Swelling at a joint. Stiffness at a joint. Warmth coming from the joint. A fever. A feeling of being sick. How is this treated? This condition may be treated with: Treating the cause, if it is known. Rest. Raising (elevating) the joint. Putting cold or hot packs on the joint. Medicines to treat symptoms and reduce pain and swelling. Shots of medicines (cortisone) into the joint. You may also be told to make changes in your life, such as doing exercises andlosing weight. Follow these instructions at home: Medicines Take over-the-counter and prescription medicines only as told by your doctor. Do not take aspirin for pain if your doctor says that you may have gout. Activity Rest your joint if your doctor tells you to. Avoid activities that make the pain worse. Exercise your joint regularly as told by your doctor. Try doing exercises like: Swimming. Water aerobics. Biking. Walking. Managing pain, stiffness, and swelling     If told, put ice on the affected area. Put ice in a plastic bag. Place a towel between your skin and the bag. Leave the ice on for 20 minutes, 2-3 times per day. If your joint is swollen, raise (elevate) it above the level of your heart if told by your doctor. If your joint feels stiff in the morning, try taking a warm shower. If told, put heat on the affected area.  Do this as often as told by your doctor. Use the heat source that your doctor recommends, such as a moist heat pack or a heating pad. If you have diabetes, do not apply heat without asking your doctor. To apply heat: Place a towel between your skin and the heat source. Leave the heat on for 20-30 minutes. Remove the heat if your skin turns bright red. This is very important if you are unable to feel pain, heat, or cold. You may have a greater risk of getting burned. General instructions Do not use any products that contain nicotine or tobacco, such as cigarettes, e-cigarettes, and chewing tobacco. If you need help quitting, ask your doctor. Keep all follow-up visits as told by your doctor. This is important. Contact a doctor if: The pain gets worse. You have a fever. Get help right away if: You have very bad pain in your joint. You have swelling in your joint. Your joint is red. Many joints become painful and swollen. You have very bad back pain. Your leg is very weak. You cannot control your pee (urine) or poop (stool). Summary Arthritis means joint pain. It can also mean joint disease. A joint is a place where bones come together. The most common cause of this condition is wear and tear of a joint. Symptoms of this condition include redness, swelling, or stiffness of the joint. This condition is treated with rest, raising the joint, medicines, and putting cold or hot packs on the joint. Follow your  doctor's instructions about medicines, activity, exercises, and other home care treatments. This information is not intended to replace advice given to you by your health care provider. Make sure you discuss any questions you have with your healthcare provider. Document Revised: 10/27/2018 Document Reviewed: 10/27/2018 Elsevier Patient Education  2022 [employer].

## 2021-05-31 NOTE — Progress Notes (Signed)
Actually Wayne [name] we dont send patients to a nephlogist until they are in CKD Stage 3b. Wayne [name] is in 3A with a GFR of 48. If GFR falls less than 44 or if he starts to develop any other kidney related issues then we would send him. Please let him know. Thanks

## 2021-06-08 ENCOUNTER — Telehealth: Payer: Self-pay

## 2021-06-08 NOTE — Chronic Care Management (AMB) (Signed)
Chronic Care Management Pharmacy Assistant   Name: JAHSIAH CARPENTER  MRN: [id number] DOB: July 30, 1926    Reason for Encounter: Disease State/Hypertension     Recent office visits:  05-30-21 Bary Castilla NP. Ambulatory Referral to [facility].  CMP+EGFR - Glucose = 143, Creatine = 1.37, eGFR=48. Lipid - LDL=107 CBC- RBC=3.75, Hemoglobin=12.6, Hematocrit=36.6, MCV=98, MCH=33.6,    Recent consult visits:  None  Hospital visits:  None in previous 6 months  Medications: Outpatient Encounter Medications as of 06/08/2021  Medication Sig   amLODipine (NORVASC) 5 MG tablet TAKE 1 TABLET BY MOUTH  DAILY   aspirin EC 81 MG tablet Take 81 mg by mouth daily.   B Complex-C (B-COMPLEX WITH VITAMIN C) tablet Take 1 tablet by mouth daily.   Cholecalciferol (VITAMIN D3) 125 MCG (5000 UT) CAPS Take by mouth daily. Monday, Wednesday, Friday   diclofenac Sodium (VOLTAREN) 1 % GEL Apply 2 g topically 4 (four) times daily.   fluocinonide cream (LIDEX) 9.93 % Apply 1 application topically 2 (two) times daily.   gabapentin (NEURONTIN) 100 MG capsule Take 1 capsule (100 mg total) by mouth at bedtime as needed.   ketoconazole (NIZORAL) 2 % cream Apply 1 application topically daily.   loratadine (CLARITIN) 10 MG tablet Take 1 tablet (10 mg total) by mouth daily.   Multiple Vitamin (MULTIVITAMIN WITH MINERALS) TABS tablet Take 1 tablet by mouth daily.   NON FORMULARY CBD gummies   Omega-3 Fatty Acids (FISH OIL) 1200 MG CAPS Take 1 capsule by mouth daily.    oxyCODONE-acetaminophen (PERCOCET/ROXICET) 5-325 MG tablet Take 1-2 tablets by mouth daily as needed for pain.    Red Yeast Rice Extract (RED YEAST RICE PO) Take 2 capsules by mouth daily.   Saw Palmetto 450 MG CAPS Take 2 capsules by mouth daily.   tamsulosin (FLOMAX) 0.4 MG CAPS capsule Take 0.4 mg by mouth daily.    timolol (BETIMOL) 0.5 % ophthalmic solution Place 1 drop into both eyes daily.    valsartan-hydrochlorothiazide  (DIOVAN-HCT) 80-12.5 MG tablet TAKE 1 TABLET BY MOUTH  DAILY (Patient taking differently: Take 2 tablets by mouth daily.)   No facility-administered encounter medications on file as of 06/08/2021.   Reviewed chart prior to disease state call. Spoke with patient regarding BP  Recent Office Vitals: BP Readings from Last 3 Encounters:  05/30/21 132/68  03/13/21 (!) 146/70  01/23/21 (!) 146/92   Pulse Readings from Last 3 Encounters:  05/30/21 70  03/13/21 78  01/23/21 75    Wt Readings from Last 3 Encounters:  05/30/21 158 lb 3.2 oz (71.8 kg)  03/13/21 166 lb 3.2 oz (75.4 kg)  01/23/21 164 lb 6.4 oz (74.6 kg)     Kidney Function Lab Results  Component Value Date/Time   CREATININE 1.37 (H) 05/30/2021 02:44 PM   CREATININE 1.30 (H) 01/23/2021 04:36 PM   GFRNONAA 47 (L) 01/23/2021 04:36 PM   GFRAA 55 (L) 01/23/2021 04:36 PM    BMP Latest Ref Rng & Units 05/30/2021 01/23/2021 08/15/2020  Glucose 65 - 99 mg/dL 143(H) 88 91  BUN 10 - 36 mg/dL _0 Creatinine 0.76 - 1.27 mg/dL 1.37(H) 1.30(H) 1.20  BUN/Creat Ratio 10 - _1 Sodium 134 - 144 mmol/L 142 145(H) 142  Potassium 3.5 - 5.2 mmol/L 3.9 4.6 4.1  Chloride 96 - 106 mmol/L 104 105 106  CO2 20 - 29 mmol/L _2 Calcium 8.6 - 10.2 mg/dL 9.4 9.5  9.3    Current antihypertensive regimen:  Valsartan/HCTZ 80-12.5mg Take 2 tablets daily. Amlodipine 5mg Take 1 tablet daily.  How often are you checking your Blood Pressure? Patient stated that he is still not checking his Blood Pressures at home. Current home BP readings: Last office visit reading 132/68. What recent interventions/DTPs have been made by any provider to improve Blood Pressure control since last CPP Visit: Exercise goal of 150 minutes per week. Counseled to monitor BP at home to check BP at least twice per week document, and provide log at future appointments. Recommended to continue current medications. Any recent hospitalizations or ED visits since last  visit with CPP? No What diet changes have been made to improve Blood Pressure Control?  Patient stated he is eating a low salt diet. What exercise is being done to improve your Blood Pressure Control?  Patient stated that he is not able to get around much due to being in pain from his arthritis.  Adherence Review: Is the patient currently on ACE/ARB medication? Yes Does the patient have >5 day gap between last estimated fill dates? Yes  Notes:  Patient picked up a Ankle support from [employer] for his foot and leg pain and edema. The patient also stated he has an over abundance of refills from [facility] due to being on Auto refill. Sent scheduling a message to schedule patient follow up appointment with Vallie Pearson CPP on 07-18-21 at 9am.   Care Gaps: Shingrix Vaccine Due Covid - 19 Booster due RAF:1.307% AWV: 01-11-22   Star Rating Drugs: Valsartan/HCTZ 80-12.5mg- Last filled 12-29-2020 [facility] (Patient stated he received his most recent prescription of Valsartan last week from [facility]. He is unsure of the exact date.)    Malecca Hicks CMA Clinical Pharmacist Assistant [phone]  

## 2021-06-27 DIAGNOSIS — H401131 Primary open-angle glaucoma, bilateral, mild stage: Secondary | ICD-10-CM | POA: Diagnosis not present

## 2021-06-27 DIAGNOSIS — Z961 Presence of intraocular lens: Secondary | ICD-10-CM | POA: Diagnosis not present

## 2021-07-17 ENCOUNTER — Telehealth: Payer: Self-pay

## 2021-07-17 NOTE — Chronic Care Management (AMB) (Addendum)
    Chronic Care Management Pharmacy Assistant   Name: Wayne [name]  MRN: [id number] DOB: [birth year]  07/17/2021- Patient called to remind of appointment with Orlando Penner, CPP on 07/18/2021 at 8:30 AM  Patient aware of appointment date, time, and type of appointment (either telephone or in person). Patient aware to have all medications and supplements near during telephone visit, patient does not check blood pressures at home.   Questions: Have you had any recent office visit or specialist visit outside of [facility]? None per patient.  Are there any concerns you would like to discuss during your office visit? None per patient but patient did mention he was having some Arthritis pain as usual.  Are you having any problems obtaining your medications? (Whether it pharmacy issues or cost)- No problems per patient.  If patient has any PAP medications ask if they are having any problems getting their PAP medication or refill?No PAP assistance needed.   Care Gaps: Zoster Vaccines- Shingrix- Overdue since 10/22/2019 COVID-19 Vaccine (4 - Booster for Moderna series)- Last completed: Sep 30, 2020   INFLUENZA VACCINE - Due   Annual Wellness visit scheduled for 01/11/2022.  Star Rating Drug: None  Any gaps in medications fill history? No  Pattricia Boss, [place] Pharmacist Assistant [phone]

## 2021-07-18 ENCOUNTER — Ambulatory Visit (INDEPENDENT_AMBULATORY_CARE_PROVIDER_SITE_OTHER): Payer: Medicare Other

## 2021-07-18 DIAGNOSIS — I1310 Hypertensive heart and chronic kidney disease without heart failure, with stage 1 through stage 4 chronic kidney disease, or unspecified chronic kidney disease: Secondary | ICD-10-CM

## 2021-07-18 DIAGNOSIS — I2510 Atherosclerotic heart disease of native coronary artery without angina pectoris: Secondary | ICD-10-CM | POA: Diagnosis not present

## 2021-07-18 NOTE — Patient Instructions (Signed)
Visit Information It was great speaking with you today!  Please let me know if you have any questions about our visit.   Goals Addressed             This Visit's Progress    Manage My Medicine       Timeframe:  Long-Range Goal Priority:  High Start Date:                             Expected End Date:                       Follow Up Date: 01/16/2022 In Progress:    - call for medicine refill 2 or 3 days before it runs out - call if I am sick and can't take my medicine - use a pillbox to sort medicine - use an alarm clock or phone to remind me to take my medicine    Why is this important?   These steps will help you keep on track with your medicines.           Patient Care Plan: Pharmacy Care Plan     Problem Identified: HTN, CAD   Priority: High     Long-Range Goal: Disease Management   Recent Progress: On track  Priority: High  Note:    Current Barriers:  Unable to independently monitor therapeutic efficacy  Pharmacist Clinical Goal(s):  Patient will achieve adherence to monitoring guidelines and medication adherence to achieve therapeutic efficacy through collaboration with PharmD and provider.   Interventions: 1:1 collaboration with Glendale Chard, MD regarding development and update of comprehensive plan of care as evidenced by provider attestation and co-signature Inter-disciplinary care team collaboration (see longitudinal plan of care) Comprehensive medication review performed; medication list updated in electronic medical record  Hypertension (BP goal <130/80) -Controlled -Current treatment: Amlodipine 5 mg tablet once per day.  Valsartan-Hydrochlorothiazide 80-12.5 mg tablet once per day.  -Current home readings: patient reports that he is not checking his BP at this time. -Current dietary habits: he reports his eating habits are good. He is eating very little meat sometimes [place] sausage and bacon occasionally. He is eating plenty of beans.   -Denies hypotensive/hypertensive symptoms -Educated on Importance of home blood pressure monitoring; Proper BP monitoring technique; -Counseled to monitor BP at home at least three times per week, document, and provide log at future appointments -Recommended to continue current medication  Coronary Artery Disease: (LDL goal < 70) -Not ideally controlled -Current treatment: Aspirin EC 81 mg tablet  Valsartan-Hydrochlorothiazide 80-12.5 mg tablet once per day  Amlodipine 5 mg tablet once per day   -Current dietary patterns: please see hypertension  -Current exercise habits: He tries to walk around as much as he can.  -Educated on Importance of limiting foods high in cholesterol; -Recommended to continue current medication  Health Maintenance -Vaccine gaps: Patient reports receiving Vaccines at [place] [facility] in [place], [place]. I will call to confirm immunization administration and check [facility] registry.  -Current therapy:  Saw Palmetto Multivitamin  Red Yeast Rice -Educated on Herbal supplement research is limited and benefits usually cannot be proven -Patient is satisfied with current therapy and denies issues -Collaborated with PCP team to confirm patients immunization status.   Patient Goals/Self-Care Activities Patient will:  - take medications as prescribed  Follow Up Plan: The patient has been provided with contact information for the care management team and has been  advised to call with any health related questions or concerns.        Patient agreed to services and verbal consent obtained.   The patient verbalized understanding of instructions, educational materials, and care plan provided today and agreed to receive a mailed copy of patient instructions, educational materials, and care plan.   Orlando Penner, PharmD Clinical Pharmacist [facility] [phone]

## 2021-07-18 NOTE — Progress Notes (Signed)
Chronic Care Management Pharmacy Note  07/18/2021 Name:  Wayne [name] MRN:  [id number] DOB:  [birth year]  Summary: Patient reports that he has had more pain than normal.   Recommendations/Changes made from today's visit: Patient would like to get a BP cuff and check his BP three times per week.   Plan: Will follow up on vaccination status with [facility] and confirm any vaccination care gaps.    Subjective: Wayne [name] is an 87 y.o. year old male who is a primary patient of Glendale Chard, MD.  The CCM team was consulted for assistance with disease management and care coordination needs.    Engaged with patient by telephone for follow up visit in response to provider referral for pharmacy case management and/or care coordination services. Patient reports that his taxes have gone up on city and county taxes. He had to pay over $1200 dollars, not including homeowners dues and liability. He reports that you don't ever get paying for a house. He reports that there is so much going on its sickening.   Consent to Services:  The patient was given information about Chronic Care Management services, agreed to services, and gave verbal consent prior to initiation of services.  Please see initial visit note for detailed documentation.   Patient Care Team: Glendale Chard, MD as PCP - General (Internal Medicine) Warden Fillers, MD as Consulting Physician (Ophthalmology) Cyril Mourning, [facility] (Inactive) (Pharmacist)  Recent office visits: 05/30/2021 PCP OV  03/13/2021 PCP OV  Recent consult visits: 12/29/2020 [facility] visits: None in previous 6 months   Objective:  Lab Results  Component Value Date   CREATININE 1.37 (H) 05/30/2021   BUN 21 05/30/2021   GFRNONAA 47 (L) 01/23/2021   GFRAA 55 (L) 01/23/2021   NA 142 05/30/2021   K 3.9 05/30/2021   CALCIUM 9.4 05/30/2021   CO2 23 05/30/2021   GLUCOSE 143 (H) 05/30/2021    Lab Results  Component Value  Date/Time   HGBA1C 5.6 12/30/2019 03:38 PM   HGBA1C 5.5 12/29/2018 12:26 PM    Last diabetic Eye exam: No results found for: HMDIABEYEEXA  Last diabetic Foot exam: No results found for: HMDIABFOOTEX   Lab Results  Component Value Date   CHOL 173 05/30/2021   HDL 52 05/30/2021   LDLCALC 107 (H) 05/30/2021   TRIG 74 05/30/2021   CHOLHDL 3.3 05/30/2021    Hepatic Function Latest Ref Rng & Units 05/30/2021 12/30/2019 12/29/2018  Total Protein 6.0 - 8.5 g/dL 6.7 7.2 6.8  Albumin 3.5 - 4.6 g/dL 4.1 4.2 4.1  AST 0 - 40 IU/L _0 ALT 0 - 44 IU/L _1 Alk Phosphatase 44 - 121 IU/L 93 108 102  Total Bilirubin 0.0 - 1.2 mg/dL 0.4 0.2 0.4    Lab Results  Component Value Date/Time   TSH 1.940 01/23/2021 04:36 PM   TSH 0.999 11/22/2015 06:27 PM   TSH 0.928 03/31/2014 04:43 PM    CBC Latest Ref Rng & Units 05/30/2021 12/30/2019 06/30/2019  WBC 3.4 - 10.8 x10E3/uL 4.6 3.9 3.9  Hemoglobin 13.0 - 17.7 g/dL 12.6(L) 13.4 13.3  Hematocrit 37.5 - 51.0 % 36.6(L) 39.9 40.2  Platelets 150 - 450 x10E3/uL 199 251 226    Lab Results  Component Value Date/Time   VD25OH 37.5 06/30/2019 10:54 AM    Clinical ASCVD: Yes  The ASCVD Risk score Mikey Bussing DC Jr., et al., 2013) failed to calculate for the following reasons:  The 2013 ASCVD risk score is only valid for ages 65 to 70    Depression screen PHQ 2/9 01/04/2021 12/30/2019 03/31/2019  Decreased Interest 0 0 0  Down, Depressed, Hopeless 0 0 0  PHQ - 2 Score 0 0 0  Altered sleeping - 3 -  Tired, decreased energy - 0 -  Change in appetite - 0 -  Feeling bad or failure about yourself  - 0 -  Trouble concentrating - 0 -  Moving slowly or fidgety/restless - 0 -  Suicidal thoughts - 0 -  PHQ-9 Score - 3 -  Difficult doing work/chores - Not difficult at all -      Social History   Tobacco Use  Smoking Status Former   Packs/day: 0.25   Years: 3.00   Pack years: 0.75   Types: Cigarettes   Quit date: 06/02/1972   Years since quitting:  49.1  Smokeless Tobacco Never   BP Readings from Last 3 Encounters:  05/30/21 132/68  03/13/21 (!) 146/70  01/23/21 (!) 146/92   Pulse Readings from Last 3 Encounters:  05/30/21 70  03/13/21 78  01/23/21 75   Wt Readings from Last 3 Encounters:  05/30/21 158 lb 3.2 oz (71.8 kg)  03/13/21 166 lb 3.2 oz (75.4 kg)  01/23/21 164 lb 6.4 oz (74.6 kg)   BMI Readings from Last 3 Encounters:  05/30/21 27.67 kg/m  03/13/21 26.03 kg/m  01/23/21 25.75 kg/m    Assessment/Interventions: Review of patient past medical history, allergies, medications, health status, including review of consultants reports, laboratory and other test data, was performed as part of comprehensive evaluation and provision of chronic care management services.   SDOH:  (Social Determinants of Health) assessments and interventions performed: Yes SDOH Interventions    Flowsheet Row Most Recent Value  SDOH Interventions   Financial Strain Interventions Intervention Not Indicated      SDOH Screenings   Alcohol Screen: Not on file  Depression (PHQ2-9): Low Risk    PHQ-2 Score: 0  Financial Resource Strain: High Risk   Difficulty of Paying Living Expenses: Very hard  Food Insecurity: No Food Insecurity   Worried About Charity fundraiser in the Last Year: Never true   Ran Out of Food in the Last Year: Never true  Housing: Not on file  Physical Activity: Inactive   Days of Exercise per Week: 0 days   Minutes of Exercise per Session: 0 min  Social Connections: Not on file  Stress: No Stress Concern Present   Feeling of Stress : Not at all  Tobacco Use: Medium Risk   Smoking Tobacco Use: Former   Smokeless Tobacco Use: Never  Transportation Needs: No Data processing manager (Medical): No   Lack of Transportation (Non-Medical): No    CCM Care Plan  Allergies  Allergen Reactions   Cephalexin     Severe mouth and throat dryness.    Medications Reviewed Today     Reviewed by  Mayford Knife, [facility] (Pharmacist) on 07/18/21 at [place]  Med List Status: <None>   Medication Order Taking? Sig Documenting Provider Last Dose Status Informant  amLODipine (NORVASC) 5 MG tablet [id number]  TAKE 1 TABLET BY MOUTH  DAILY Glendale Chard, MD  Active   aspirin EC 81 MG tablet [id number]  Take 81 mg by mouth daily. [provider]  Active Self  B Complex-C (B-COMPLEX WITH VITAMIN C) tablet [id number]  Take 1 tablet by mouth daily. [provider]  Active  Cholecalciferol (VITAMIN D3) 125 MCG (5000 UT) CAPS [id number]  Take by mouth daily. Monday, Wednesday, Friday [provider]  Active   diclofenac Sodium (VOLTAREN) 1 % GEL [id number]  Apply 2 g topically 4 (four) times daily. Glendale Chard, MD  Active   fluocinonide cream (LIDEX) 0.05 % [id number]  Apply 1 application topically 2 (two) times daily. Glendale Chard, MD  Active   gabapentin (NEURONTIN) 100 MG capsule [id number]  Take 1 capsule (100 mg total) by mouth at bedtime as needed. Glendale Chard, MD  Active   ketoconazole (NIZORAL) 2 % cream [id number]  Apply 1 application topically daily. Glendale Chard, MD  Active   loratadine (CLARITIN) 10 MG tablet [id number]  Take 1 tablet (10 mg total) by mouth daily. Glendale Chard, MD  Expired 08/15/20 2359   Multiple Vitamin (MULTIVITAMIN WITH MINERALS) TABS tablet [id number]  Take 1 tablet by mouth daily. [provider]  Active Self  Baruch Gouty [id number]  CBD gummies [provider]  Active   Omega-3 Fatty Acids (FISH OIL) 1200 MG CAPS [id number]  Take 1 capsule by mouth daily.  [provider]  Active Self  oxyCODONE-acetaminophen (PERCOCET/ROXICET) 5-325 MG tablet [id number]  Take 1-2 tablets by mouth daily as needed for pain.  [provider]  Active Self  Red Yeast Rice Extract (RED YEAST RICE PO) [id number]  Take 2 capsules by mouth daily. [provider]  Active   Saw Palmetto 450 MG CAPS [id number]  Take 2 capsules by  mouth daily. [provider]  Active   tamsulosin (FLOMAX) 0.4 MG CAPS capsule [id number]  Take 0.4 mg by mouth daily.  [provider]  Active   timolol (BETIMOL) 0.5 % ophthalmic solution [id number]  Place 1 drop into both eyes daily.  [provider]  Active Self  valsartan-hydrochlorothiazide (DIOVAN-HCT) 80-12.5 MG tablet [id number]  TAKE 1 TABLET BY MOUTH  DAILY  Patient taking differently: Take 2 tablets by mouth daily.   Glendale Chard, MD  Active             Patient Active Problem List   Diagnosis Date Noted   Renal artery atherosclerosis ([place]) 12/30/2019   Viral upper respiratory tract infection 03/09/2019   Hearing loss due to cerumen impaction, bilateral 12/04/2018   Acute back pain 06/10/2018   Spondylosis of lumbar region without myelopathy or radiculopathy 06/10/2018   Carpal tunnel syndrome of left wrist 12/12/2017   Hammertoe 07/01/2016   Anemia of chronic renal failure, stage 3 (moderate) ([place]) 11/23/2015   Dehydration 11/22/2015   CKD (chronic kidney disease) stage 3, GFR 30-59 ml/min (HCC) 11/22/2015   Sepsis ([place]) 11/22/2015   Diarrhea 11/22/2015   Leukopenia 11/22/2015   Benign essential HTN 11/22/2015   Coronary artery disease involving native coronary artery without angina pectoris 03/18/2012   Hyperlipidemia 03/18/2012    Immunization History  Administered Date(s) Administered   Fluad Quad(high Dose 65+) 08/07/2019, 08/15/2020   Influenza, High Dose Seasonal PF 09/06/2017   Influenza-Unspecified 08/04/2018   Moderna SARS-COV2 Booster Vaccination 09/30/2020   Moderna Sars-Covid-2 Vaccination 12/27/2019, 01/24/2020   Pneumococcal Conjugate-13 08/27/2019   Pneumococcal-Unspecified 06/18/2011   Zoster Recombinat (Shingrix) 08/27/2019    Conditions to be addressed/monitored:  Hypertension and Hyperlipidemia  Care Plan : Pharmacy Care Plan  Updates made by Mayford Knife, [place] since 07/18/2021 12:00 AM     Problem: HTN,  CAD   Priority: High     Long-Range Goal: Disease Management   Recent Progress: On track  Priority:  High  Note:    Current Barriers:  Unable to independently monitor therapeutic efficacy  Pharmacist Clinical Goal(s):  Patient will achieve adherence to monitoring guidelines and medication adherence to achieve therapeutic efficacy through collaboration with PharmD and provider.   Interventions: 1:1 collaboration with Glendale Chard, MD regarding development and update of comprehensive plan of care as evidenced by provider attestation and co-signature Inter-disciplinary care team collaboration (see longitudinal plan of care) Comprehensive medication review performed; medication list updated in electronic medical record  Hypertension (BP goal <130/80) -Controlled -Current treatment: Amlodipine 5 mg tablet once per day.  Valsartan-Hydrochlorothiazide 80-12.5 mg tablet once per day.  -Current home readings: patient reports that he is not checking his BP at this time. -Current dietary habits: he reports his eating habits are good. He is eating very little meat sometimes [place] sausage and bacon occasionally. He is eating plenty of beans.  -Denies hypotensive/hypertensive symptoms -Educated on Importance of home blood pressure monitoring; Proper BP monitoring technique; -Counseled to monitor BP at home at least three times per week, document, and provide log at future appointments -Recommended to continue current medication  Coronary Artery Disease: (LDL goal < 70) -Not ideally controlled -Current treatment: Aspirin EC 81 mg tablet  Valsartan-Hydrochlorothiazide 80-12.5 mg tablet once per day  Amlodipine 5 mg tablet once per day   -Current dietary patterns: please see hypertension  -Current exercise habits: He tries to walk around as much as he can.  -Educated on Importance of limiting foods high in cholesterol; -Recommended to continue current medication  Health  Maintenance -Vaccine gaps: Patient reports receiving Vaccines at [place] [facility] in [place], [place]. I will call to confirm immunization administration and check NCIR registry.  -Current therapy:  Saw Palmetto Multivitamin  Red Yeast Rice -Educated on Herbal supplement research is limited and benefits usually cannot be proven -Patient is satisfied with current therapy and denies issues -Collaborated with PCP team to confirm patients immunization status.   Patient Goals/Self-Care Activities Patient will:  - take medications as prescribed  Follow Up Plan: The patient has been provided with contact information for the care management team and has been advised to call with any health related questions or concerns.        Medication Assistance: None required.  Patient affirms current coverage meets needs.  Compliance/Adherence/Medication fill history: Care Gaps: COVID-19 Vaccine Booster - Patient has received this vaccine ([place])  Shingrix Vaccine - ([place])  Influenza Vaccine    Star-Rating Drugs: Valsartan - Hydrochlorothiazide 80-12.5 mg  Patient's preferred pharmacy is:  [facility] - [place], [address] [place] [address] Phone: [phone] Fax: [phone]  [id number] - [place], [place] - [address] [place] [address] Phone: [phone] Fax: [phone]  Uses pill box? No - patient keeps his medications in bottles  Pt endorses 100% compliance  We discussed: Current pharmacy is preferred with insurance plan and patient is satisfied with pharmacy services Patient decided to: Continue current medication management strategy  Care Plan and Follow Up Patient Decision:  Patient agrees to Care Plan and Follow-up.  Plan: The patient has been provided with  contact information for the care management team and has been advised to call with any health related questions or concerns.   Orlando Penner, PharmD Clinical Pharmacist [facility]  [phone]

## 2021-07-24 ENCOUNTER — Ambulatory Visit (INDEPENDENT_AMBULATORY_CARE_PROVIDER_SITE_OTHER): Payer: Medicare Other | Admitting: Internal Medicine

## 2021-07-24 ENCOUNTER — Other Ambulatory Visit: Payer: Self-pay

## 2021-07-24 ENCOUNTER — Encounter: Payer: Self-pay | Admitting: Internal Medicine

## 2021-07-24 VITALS — BP 144/70 | HR 72 | Temp 98.1°F | Ht 63.8 in | Wt 158.8 lb

## 2021-07-24 DIAGNOSIS — G8929 Other chronic pain: Secondary | ICD-10-CM | POA: Diagnosis not present

## 2021-07-24 DIAGNOSIS — M159 Polyosteoarthritis, unspecified: Secondary | ICD-10-CM

## 2021-07-24 DIAGNOSIS — E663 Overweight: Secondary | ICD-10-CM

## 2021-07-24 DIAGNOSIS — I1310 Hypertensive heart and chronic kidney disease without heart failure, with stage 1 through stage 4 chronic kidney disease, or unspecified chronic kidney disease: Secondary | ICD-10-CM

## 2021-07-24 DIAGNOSIS — I2510 Atherosclerotic heart disease of native coronary artery without angina pectoris: Secondary | ICD-10-CM

## 2021-07-24 DIAGNOSIS — Z6827 Body mass index (BMI) 27.0-27.9, adult: Secondary | ICD-10-CM

## 2021-07-24 MED ORDER — GABAPENTIN 100 MG PO CAPS: 100.0000 mg | ORAL_CAPSULE | Freq: Every evening | ORAL | 2 refills | Status: DC | PRN

## 2021-07-24 NOTE — Progress Notes (Signed)
I,Tianna Badgett,acting as a Education administrator for [name], MD.,have documented all relevant documentation on the behalf of [name], MD,as directed by  [name], MD while in the presence of [name], MD.  This visit occurred during the SARS-CoV-2 public health emergency.  Safety protocols were in place, including screening questions prior to the visit, additional usage of staff PPE, and extensive cleaning of exam room while observing appropriate contact time as indicated for disinfecting solutions.  Subjective:     Patient ID: Wayne [name] , male    DOB: Jul 14, 1926 , 87 y.o.   MRN: [id number]   Chief Complaint  Patient presents with   Hypertension    HPI  He presents today for BP check. He reports compliance with meds. Denies headaches, chest pain and shortness of breath. He is most bothered by arthritic pain. Did not start gabapentin. Unable to use Voltaren gel b/c he states it burns.   Hypertension This is a chronic problem. The current episode started more than 1 year ago. The problem has been gradually improving since onset. The problem is controlled. Pertinent negatives include no blurred vision, palpitations or shortness of breath. Past treatments include calcium channel blockers, angiotensin blockers, diuretics and lifestyle changes. The current treatment provides moderate improvement. Hypertensive end-organ damage includes kidney disease.    Past Medical History:  Diagnosis Date   Arthritis    Cataract    Glaucoma    Hypertension      Family History  Problem Relation Age of Onset   Dementia Mother    Cancer Father      Current Outpatient Medications:    amLODipine (NORVASC) 5 MG tablet, TAKE 1 TABLET BY MOUTH  DAILY, Disp: 90 tablet, Rfl: 3   aspirin EC 81 MG tablet, Take 81 mg by mouth daily., Disp: , Rfl:    B Complex-C (B-COMPLEX WITH VITAMIN C) tablet, Take 1 tablet by mouth daily., Disp: , Rfl:    Cholecalciferol (VITAMIN D3) 125 MCG (5000  UT) CAPS, Take by mouth daily. Monday, Wednesday, Friday, Disp: , Rfl:    diclofenac Sodium (VOLTAREN) 1 % GEL, Apply 2 g topically 4 (four) times daily., Disp: 150 g, Rfl: 1   fluocinonide cream (LIDEX) 4.09 %, Apply 1 application topically 2 (two) times daily., Disp: 30 g, Rfl: 1   gabapentin (NEURONTIN) 100 MG capsule, Take 1 capsule (100 mg total) by mouth at bedtime as needed., Disp: 30 capsule, Rfl: 2   ketoconazole (NIZORAL) 2 % cream, Apply 1 application topically daily., Disp: 15 g, Rfl: 1   loratadine (CLARITIN) 10 MG tablet, Take 1 tablet (10 mg total) by mouth daily., Disp: 90 tablet, Rfl: 2   Multiple Vitamin (MULTIVITAMIN WITH MINERALS) TABS tablet, Take 1 tablet by mouth daily., Disp: , Rfl:    NON FORMULARY, CBD gummies, Disp: , Rfl:    Omega-3 Fatty Acids (FISH OIL) 1200 MG CAPS, Take 1 capsule by mouth daily. , Disp: , Rfl:    oxyCODONE-acetaminophen (PERCOCET/ROXICET) 5-325 MG tablet, Take 1-2 tablets by mouth daily as needed for pain. , Disp: , Rfl: 0   Red Yeast Rice Extract (RED YEAST RICE PO), Take 2 capsules by mouth daily., Disp: , Rfl:    Saw Palmetto 450 MG CAPS, Take 2 capsules by mouth daily., Disp: , Rfl:    tamsulosin (FLOMAX) 0.4 MG CAPS capsule, Take 0.4 mg by mouth daily. , Disp: , Rfl:    timolol (BETIMOL) 0.5 % ophthalmic solution, Place 1  drop into both eyes daily. , Disp: , Rfl:    valsartan-hydrochlorothiazide (DIOVAN-HCT) 80-12.5 MG tablet, TAKE 1 TABLET BY MOUTH  DAILY (Patient taking differently: Take 2 tablets by mouth daily.), Disp: 90 tablet, Rfl: 3   Allergies  Allergen Reactions   Cephalexin     Severe mouth and throat dryness.     Review of Systems  Constitutional: Negative.   Eyes:  Negative for blurred vision.  Respiratory: Negative.  Negative for shortness of breath.   Cardiovascular: Negative.  Negative for palpitations.  Gastrointestinal: Negative.   Musculoskeletal:  Positive for arthralgias.  Neurological: Negative.    Psychiatric/Behavioral: Negative.      Today's Vitals   07/24/21 1423  BP: (!) 144/70  Pulse: 72  Temp: 98.1 F (36.7 C)  TempSrc: Oral  Weight: 158 lb 12.8 oz (72 kg)  Height: 5' 3.8" (1.621 m)   Body mass index is 27.43 kg/m.  Wt Readings from Last 3 Encounters:  07/24/21 158 lb 12.8 oz (72 kg)  05/30/21 158 lb 3.2 oz (71.8 kg)  03/13/21 166 lb 3.2 oz (75.4 kg)    Objective:  Physical Exam Vitals and nursing note reviewed.  Constitutional:      Appearance: Normal appearance.  HENT:     Head: Normocephalic and atraumatic.     Nose:     Comments: Masked     Mouth/Throat:     Comments: Masked  Eyes:     Extraocular Movements: Extraocular movements intact.  Cardiovascular:     Rate and Rhythm: Normal rate and regular rhythm.     Heart sounds: Normal heart sounds.  Pulmonary:     Effort: Pulmonary effort is normal.     Breath sounds: Normal breath sounds.  Musculoskeletal:        General: Tenderness and deformity present.     Cervical back: Normal range of motion.     Comments: Arthritis changes of b/l hands  Skin:    General: Skin is warm.  Neurological:     General: No focal deficit present.     Mental Status: He is alert.  Psychiatric:        Mood and Affect: Mood normal.        Assessment And Plan:     1. Hypertensive heart and renal disease with renal failure, stage 1 through stage 4 or unspecified chronic kidney disease, without heart failure Comments: Chronic, uncontrolled. Likely exacerbated by pain. I will not make any med changes today.   2. Coronary artery disease involving native coronary artery of native heart without angina pectoris Comments: Chronic, stable. Encouraged to follow heart healthy diet.   3. Generalized osteoarthritis of multiple sites Comments: He states pain has progressed and he has difficulty performing household duties. Would like assistant to help. Agrees to CCM referral.   4. Other chronic pain Comments: He has not been  taking gabapentin. I will start with one capsule nightly. He is to call me next week to let me know if this is working for him. I plan to increa - AMB Referral to [facility] Coordin[facility]  5. Overweight with body mass index (BMI) of 27 to 27.9 in adult Comments: His BMI is acceptable for his demographic. He is encouraged to perform chair exercises daily.   Patient was given opportunity to ask questions. Patient verbalized understanding of the plan and was able to repeat key elements of the plan. All questions were answered to their satisfaction.   I, [name], MD, have reviewed  all documentation for this visit. The documentation on 07/24/21 for the exam, diagnosis, procedures, and orders are all accurate and complete.   IF YOU HAVE BEEN REFERRED TO A SPECIALIST, IT MAY TAKE 1-2 WEEKS TO SCHEDULE/PROCESS THE REFERRAL. IF YOU HAVE NOT HEARD FROM US/SPECIALIST IN TWO WEEKS, PLEASE GIVE [place] A CALL AT [phone] X [phone].   THE PATIENT IS ENCOURAGED TO PRACTICE SOCIAL DISTANCING DUE TO THE COVID-19 PANDEMIC.

## 2021-07-24 NOTE — Patient Instructions (Addendum)

## 2021-07-25 ENCOUNTER — Telehealth: Payer: Self-pay | Admitting: *Deleted

## 2021-07-25 NOTE — Chronic Care Management (AMB) (Signed)
  Chronic Care Management   Note  07/25/2021 Name: Wayne [name] MRN: [id number] DOB: 10/05/1926  Wayne [name] is a 87 y.o. year old male who is a primary care patient of Glendale Chard, MD. Wayne [name] is currently enrolled in care management services. An additional referral for BSW was placed.   Follow up plan: Telephone appointment with care management team member scheduled for:07/31/21  Wayne [name]  Care Guide, Embedded Care Coordination [place]  Care Management  Direct Dial: [phone]

## 2021-07-31 ENCOUNTER — Telehealth: Payer: Self-pay

## 2021-07-31 ENCOUNTER — Telehealth: Payer: Medicare Other

## 2021-07-31 NOTE — Telephone Encounter (Signed)
The patient was notified to increase to 2 tabs at bedtime per Dr. Baird Cancer to see if it will help with the pt's foot pain.

## 2021-07-31 NOTE — Telephone Encounter (Signed)
  Care Management   Follow Up Note   07/31/2021 Name: Wayne [name] MRN: [id number] DOB: 12/01/1926   Referred by: Glendale Chard, MD Reason for referral : Chronic Care Management (Unsuccessful call)   An unsuccessful telephone outreach was attempted today. The patient was referred to the case management team for assistance with care management and care coordination. Unable to leave voice message, phone rang for several minutes with no answer.  Follow Up Plan: The care management team will reach out to the patient again over the next 30 days.   Daneen Schick, BSW, CDP Social Worker, Certified Dementia Practitioner [place] [facility] [place] [facility] [phone]

## 2021-08-14 ENCOUNTER — Telehealth: Payer: Medicare Other

## 2021-08-14 ENCOUNTER — Telehealth: Payer: Self-pay

## 2021-08-14 NOTE — Telephone Encounter (Signed)
  Care Management   Follow Up Note   08/14/2021 Name: Wayne [name] MRN: [id number] DOB: Nov 09, 1926   Referred by: Glendale Chard, MD Reason for referral : Chronic Care Management (Unsuccessful call)   A second unsuccessful telephone outreach was attempted today. The patient was referred to the case management team for assistance with care management and care coordination.   Follow Up Plan: The care management team will reach out to the patient again over the next 30 days.   Daneen Schick, BSW, CDP Social Worker, Certified Dementia Practitioner [place] [facility] [place] [facility] [phone]

## 2021-08-16 ENCOUNTER — Other Ambulatory Visit: Payer: Self-pay

## 2021-08-16 MED ORDER — KETOCONAZOLE 2 % EX CREA: 1.0000 "application " | TOPICAL_CREAM | Freq: Every day | CUTANEOUS | 1 refills | Status: DC

## 2021-08-16 MED ORDER — GABAPENTIN 100 MG PO CAPS
300.0000 mg | ORAL_CAPSULE | Freq: Every evening | ORAL | 2 refills | Status: DC | PRN
Start: 2021-08-16 — End: 2021-09-07

## 2021-08-21 ENCOUNTER — Telehealth: Payer: Self-pay

## 2021-08-21 NOTE — Telephone Encounter (Signed)
  Care Management   Follow Up Note   08/21/2021 Name: Wayne [name] MRN: [id number] DOB: 1926-04-22   Referred by: Glendale Chard, MD Reason for referral : Chronic Care Management (Unsuccessful call)   SW placed an unsuccessful outbound call to the patient in response to a voice message received. Unfortunately, the patient did not answer with not option to leave a voice message.  Follow Up Plan: The care management team will reach out to the patient again over the next 30 days.   Daneen Schick, BSW, CDP Social Worker, Certified Dementia Practitioner [place] [facility] [place] [facility] [phone]

## 2021-08-28 ENCOUNTER — Telehealth: Payer: Self-pay | Admitting: *Deleted

## 2021-08-28 NOTE — Chronic Care Management (AMB) (Signed)
  Care Management   Note  08/28/2021 Name: Wayne [name] MRN: [id number] DOB: 12-26-1925  Wayne [name] is a 87 y.o. year old male who is a primary care patient of Glendale Chard, MD and is actively engaged with the care management team. I reached out to [name] by phone today to assist with re-scheduling an initial visit with the BSW  Follow up plan: A telephone outreach attempt made. The care management team will reach out to the patient again over the next 7 days. If patient returns call to provider office, please advise to call [place] at [place] [facility]  Direct Dial: [phone]

## 2021-08-29 ENCOUNTER — Ambulatory Visit (INDEPENDENT_AMBULATORY_CARE_PROVIDER_SITE_OTHER): Payer: Medicare Other

## 2021-08-29 ENCOUNTER — Other Ambulatory Visit: Payer: Self-pay

## 2021-08-29 VITALS — Temp 98.0°F

## 2021-08-29 DIAGNOSIS — Z23 Encounter for immunization: Secondary | ICD-10-CM

## 2021-08-29 NOTE — Progress Notes (Signed)
Patient presents today for a flu vaccine. YL,RMA 

## 2021-08-29 NOTE — Chronic Care Management (AMB) (Signed)
  Care Management   Note  08/29/2021 Name: Wayne [name] MRN: [id number] DOB: [birth year]  Wayne [name] is a 87 y.o. year old male who is a primary care patient of Glendale Chard, MD and is actively engaged with the care management team. I reached out to [name] by phone today to assist with re-scheduling an initial visit with the BSW  Follow up plan: A second unsuccessful telephone outreach attempt made.The care management team will reach out to the patient again over the next 7 days. If patient returns call to provider office, please advise to call [place] at [phone].  [facility]  Direct Dial: [phone]

## 2021-08-30 ENCOUNTER — Encounter: Payer: Self-pay | Admitting: Internal Medicine

## 2021-09-04 ENCOUNTER — Encounter: Payer: Self-pay | Admitting: Podiatry

## 2021-09-04 ENCOUNTER — Ambulatory Visit (INDEPENDENT_AMBULATORY_CARE_PROVIDER_SITE_OTHER): Payer: Medicare Other

## 2021-09-04 ENCOUNTER — Ambulatory Visit (INDEPENDENT_AMBULATORY_CARE_PROVIDER_SITE_OTHER): Payer: Medicare Other | Admitting: Podiatry

## 2021-09-04 ENCOUNTER — Other Ambulatory Visit: Payer: Self-pay

## 2021-09-04 DIAGNOSIS — M2142 Flat foot [pes planus] (acquired), left foot: Secondary | ICD-10-CM

## 2021-09-04 DIAGNOSIS — M2141 Flat foot [pes planus] (acquired), right foot: Secondary | ICD-10-CM

## 2021-09-04 DIAGNOSIS — M779 Enthesopathy, unspecified: Secondary | ICD-10-CM

## 2021-09-04 DIAGNOSIS — G609 Hereditary and idiopathic neuropathy, unspecified: Secondary | ICD-10-CM | POA: Diagnosis not present

## 2021-09-04 NOTE — Chronic Care Management (AMB) (Signed)
  Care Management   Note  09/04/2021 Name: Wayne [name] MRN: [id number] DOB: 30-Jul-1926  [name] is a 87 y.o. year old male who is a primary care patient of Glendale Chard, MD and is actively engaged with the care management team. I reached out to [name] by phone today to assist with re-scheduling an initial visit with the BSW  Follow up plan: Unsuccessful telephone outreach attempt made. A HIPAA compliant phone message was left for the patient providing contact information and requesting a return call. Unable to make contact on outreach attempts x 3. PCP, Dr Baird Cancer notified via routed documentation in medical record. We have been unable to make contact with the patient for follow up. The care management team is available to follow up with the patient after provider conversation with the patient regarding recommendation for care management engagement and subsequent re-referral to the care management team.   [facility]  Direct Dial: [phone]

## 2021-09-04 NOTE — Progress Notes (Signed)
  Subjective:  Patient ID: Wayne [name], male    DOB: 1926/09/21,   MRN: [id number]  Chief Complaint  Patient presents with   Foot Pain    Bilateral foot pain x 10+ years. Pain located at arch and lower region of both ankles. Burning sensations associated with edema and tingling. No numbness or warmth.      87 y.o. male presents for bilateral foot pain for 10+ years. States he has burning sensation in his feet and will also get swelling and tingles in his feet. Denies any numbness. Relates he feels most of hte pain in his arches and below his ankle.  He is currently taking gabapentin. He has been seen by Dr. Jacqualyn Posey in the past for capsulitis and flat feet. Last A1c was 5.6 . Denies any other pedal complaints. Denies n/v/f/c.   Past Medical History:  Diagnosis Date   Arthritis    Cataract    Glaucoma    Hypertension     Objective:  Physical Exam: Vascular: DP/PT pulses 2/4 bilateral. CFT <3 seconds. Normal hair growth on digits. 2+ pitting edema noted to bilateral lower extremities.  Skin. No lacerations or abrasions bilateral feet.  Musculoskeletal: MMT 5/5 bilateral lower extremities in DF, PF, Inversion and Eversion. Deceased ROM in DF/ PF of ankle joint. Pes planus noted bilateral with valgus deformity at ankle joints.  No pinpoint areas of tenderness.  Neurological: Sensation intact to light touch.   Assessment:   1. Pes planus of both feet   2. Idiopathic peripheral neuropathy      Plan:  Patient was evaluated and treated and all questions answered. Discussed neuropathy and arthritis with patient and treatment options.  X-rays reviewed and discussed with patient. Degenerative changes and valgus deformity of ankle noted bilateral. Pes planus bilateral. Retained hardware from previous bunion surgery on right. Degenerative changes noted to bilateral subtalar and midfoot joints and ankle.  Recommend patient follow-up with PCP who is prescribing gabapentin currently to  discuss and increase in dosage to help with burning pain in feet.  Discussed continuing with compression for swelling.  Do not recommend any further treatment for arthritis in ankle and subtalar joints at this time. Pain is maintained and do not recommend surgery at this point.  Patient to return as needed if any worsening or any other concerns arise.    Lorenda Peck, DPM

## 2021-09-07 ENCOUNTER — Other Ambulatory Visit: Payer: Self-pay

## 2021-09-07 ENCOUNTER — Ambulatory Visit (INDEPENDENT_AMBULATORY_CARE_PROVIDER_SITE_OTHER): Payer: Medicare Other | Admitting: Internal Medicine

## 2021-09-07 ENCOUNTER — Encounter: Payer: Self-pay | Admitting: Internal Medicine

## 2021-09-07 VITALS — BP 144/70 | HR 63 | Temp 97.9°F | Ht 61.0 in | Wt 161.6 lb

## 2021-09-07 DIAGNOSIS — I1310 Hypertensive heart and chronic kidney disease without heart failure, with stage 1 through stage 4 chronic kidney disease, or unspecified chronic kidney disease: Secondary | ICD-10-CM

## 2021-09-07 DIAGNOSIS — Z23 Encounter for immunization: Secondary | ICD-10-CM | POA: Diagnosis not present

## 2021-09-07 DIAGNOSIS — I2510 Atherosclerotic heart disease of native coronary artery without angina pectoris: Secondary | ICD-10-CM

## 2021-09-07 DIAGNOSIS — M159 Polyosteoarthritis, unspecified: Secondary | ICD-10-CM | POA: Diagnosis not present

## 2021-09-07 MED ORDER — GABAPENTIN 300 MG PO CAPS: 300.0000 mg | ORAL_CAPSULE | Freq: Two times a day (BID) | ORAL | 2 refills | Status: DC

## 2021-09-07 MED ORDER — TETANUS-DIPHTH-ACELL PERTUSSIS 5-2.5-18.5 LF-MCG/0.5 IM SUSP: 0.5000 mL | Freq: Once | INTRAMUSCULAR | 0 refills | Status: AC

## 2021-09-07 NOTE — Progress Notes (Signed)
Rich Brave [place],acting as a Education administrator for [name], MD.,have documented all relevant documentation on the behalf of [name], MD,as directed by  [name], MD while in the presence of [name], MD.  This visit occurred during the SARS-CoV-2 public health emergency.  Safety protocols were in place, including screening questions prior to the visit, additional usage of staff PPE, and extensive cleaning of exam room while observing appropriate contact time as indicated for disinfecting solutions.  Subjective:     Patient ID: Wayne [name] , male    DOB: 25-Oct-1926 , 87 y.o.   MRN: [id number]   Chief Complaint  Patient presents with   Hypertension    HPI  Patient presents today for a bp check. He reports compliance with meds. Denies headaches, chest pain and shortness of breath. Still waiting to get a call from CCM, wants to have someone help him with household chores. States he has difficulty since he is ambulatory with a cane.   Hypertension This is a chronic problem. The current episode started more than 1 year ago. The problem has been gradually improving since onset. The problem is controlled. Pertinent negatives include no blurred vision, palpitations or shortness of breath. Past treatments include calcium channel blockers, angiotensin blockers, diuretics and lifestyle changes. The current treatment provides moderate improvement. Hypertensive end-organ damage includes kidney disease.    Past Medical History:  Diagnosis Date   Arthritis    Cataract    Glaucoma    Hypertension      Family History  Problem Relation Age of Onset   Dementia Mother    Cancer Father      Current Outpatient Medications:    amLODipine (NORVASC) 5 MG tablet, TAKE 1 TABLET BY MOUTH  DAILY, Disp: 90 tablet, Rfl: 3   aspirin EC 81 MG tablet, Take 81 mg by mouth daily., Disp: , Rfl:    B Complex-C (B-COMPLEX WITH VITAMIN C) tablet, Take 1 tablet by mouth daily., Disp: , Rfl:     Cholecalciferol (VITAMIN D3) 125 MCG (5000 UT) CAPS, Take by mouth daily. Monday, Wednesday, Friday, Disp: , Rfl:    diclofenac Sodium (VOLTAREN) 1 % GEL, Apply 2 g topically 4 (four) times daily., Disp: 150 g, Rfl: 1   fluocinonide cream (LIDEX) 2.37 %, Apply 1 application topically 2 (two) times daily., Disp: 30 g, Rfl: 1   gabapentin (NEURONTIN) 300 MG capsule, Take 1 capsule (300 mg total) by mouth 2 (two) times daily., Disp: 180 capsule, Rfl: 2   ketoconazole (NIZORAL) 2 % cream, Apply 1 application topically daily., Disp: 15 g, Rfl: 1   loratadine (CLARITIN) 10 MG tablet, Take 1 tablet (10 mg total) by mouth daily., Disp: 90 tablet, Rfl: 2   Multiple Vitamin (MULTIVITAMIN WITH MINERALS) TABS tablet, Take 1 tablet by mouth daily., Disp: , Rfl:    NON FORMULARY, CBD gummies, Disp: , Rfl:    Omega-3 Fatty Acids (FISH OIL) 1200 MG CAPS, Take 1 capsule by mouth daily. , Disp: , Rfl:    oxyCODONE-acetaminophen (PERCOCET/ROXICET) 5-325 MG tablet, Take 1-2 tablets by mouth daily as needed for pain. , Disp: , Rfl: 0   Red Yeast Rice Extract (RED YEAST RICE PO), Take 2 capsules by mouth daily., Disp: , Rfl:    Saw Palmetto 450 MG CAPS, Take 2 capsules by mouth daily., Disp: , Rfl:    tamsulosin (FLOMAX) 0.4 MG CAPS capsule, Take 0.4 mg by mouth daily. , Disp: , Rfl:  Tdap (BOOSTRIX) 5-2.5-18.5 LF-MCG/0.5 injection, Inject 0.5 mLs into the muscle once for 1 dose., Disp: 0.5 mL, Rfl: 0   timolol (BETIMOL) 0.5 % ophthalmic solution, Place 1 drop into both eyes daily. , Disp: , Rfl:    valsartan-hydrochlorothiazide (DIOVAN-HCT) 80-12.5 MG tablet, TAKE 1 TABLET BY MOUTH  DAILY (Patient taking differently: Take 2 tablets by mouth daily.), Disp: 90 tablet, Rfl: 3   Allergies  Allergen Reactions   Cephalexin     Severe mouth and throat dryness.     Review of Systems  Constitutional: Negative.   Eyes: Negative.  Negative for blurred vision.  Respiratory: Negative.  Negative for shortness of  breath.   Cardiovascular: Negative.  Negative for palpitations.  Gastrointestinal: Negative.   Skin: Negative.   Neurological: Negative.   Hematological: Negative.   Psychiatric/Behavioral: Negative.      Today's Vitals   09/07/21 1405 09/07/21 1428  BP: (!) 142/80 (!) 144/70  Pulse: 63   Temp: 97.9 F (36.6 C)   Weight: 161 lb 9.6 oz (73.3 kg)   Height: 5\' 1"  (1.549 m)   PainSc: 7    PainLoc: Foot    Body mass index is 30.53 kg/m.  Wt Readings from Last 3 Encounters:  09/07/21 161 lb 9.6 oz (73.3 kg)  07/24/21 158 lb 12.8 oz (72 kg)  05/30/21 158 lb 3.2 oz (71.8 kg)    BP Readings from Last 3 Encounters:  09/07/21 (!) 144/70  07/24/21 (!) 144/70  05/30/21 132/68    Objective:  Physical Exam Vitals and nursing note reviewed.  Constitutional:      Appearance: Normal appearance.  HENT:     Nose:     Comments: Masked     Mouth/Throat:     Comments: Masked  Eyes:     Extraocular Movements: Extraocular movements intact.  Cardiovascular:     Rate and Rhythm: Normal rate and regular rhythm.     Heart sounds: Normal heart sounds.  Pulmonary:     Effort: Pulmonary effort is normal.     Breath sounds: Normal breath sounds.  Musculoskeletal:        General: Swelling and deformity present.     Comments: Joint deformities b/l hands  Skin:    General: Skin is warm.  Neurological:     General: No focal deficit present.     Mental Status: He is alert.  Psychiatric:        Mood and Affect: Mood normal.        Assessment And Plan:     1. Hypertensive heart and renal disease with renal failure, stage 1 through stage 4 or unspecified chronic kidney disease, without heart failure Comments: Chronic, uncontrolled. I will not change meds today. Pt aware that our initial goal is to get BP <140/80. Encouraged to restrict salt intake.   2. Coronary artery disease involving native coronary artery of native heart without angina pectoris Comments: Chronic, encouraged to follow  heart healthy diet.   3. Generalized osteoarthritis of multiple sites Comments: HE is now up to gabapentin 300mg  nightly. I will send in 300mg  capsule, to be taken twice daily to the pharmacy. He will let me know if this is helpful.   4. Immunization due Comments: I will send rx Tdap (Boostrix) to his local pharmacy.  - Tdap (BOOSTRIX) 5-2.5-18.5 LF-MCG/0.5 injection; Inject 0.5 mLs into the muscle once for 1 dose.  Dispense: 0.5 mL; Refill: 0   Patient was given opportunity to ask questions. Patient verbalized understanding of  the plan and was able to repeat key elements of the plan. All questions were answered to their satisfaction.   I, [name], MD, have reviewed all documentation for this visit. The documentation on 09/07/21 for the exam, diagnosis, procedures, and orders are all accurate and complete.   IF YOU HAVE BEEN REFERRED TO A SPECIALIST, IT MAY TAKE 1-2 WEEKS TO SCHEDULE/PROCESS THE REFERRAL. IF YOU HAVE NOT HEARD FROM US/SPECIALIST IN TWO WEEKS, PLEASE GIVE [place] A CALL AT [phone].   THE PATIENT IS ENCOURAGED TO PRACTICE SOCIAL DISTANCING DUE TO THE COVID-19 PANDEMIC.

## 2021-09-07 NOTE — Patient Instructions (Signed)

## 2021-10-04 ENCOUNTER — Other Ambulatory Visit: Payer: Self-pay

## 2021-10-04 ENCOUNTER — Encounter: Payer: Self-pay | Admitting: Nurse Practitioner

## 2021-10-04 ENCOUNTER — Ambulatory Visit (INDEPENDENT_AMBULATORY_CARE_PROVIDER_SITE_OTHER): Payer: Medicare Other | Admitting: Nurse Practitioner

## 2021-10-04 VITALS — BP 128/77 | HR 70 | Temp 98.9°F | Ht 63.8 in | Wt 163.6 lb

## 2021-10-04 DIAGNOSIS — K921 Melena: Secondary | ICD-10-CM

## 2021-10-04 DIAGNOSIS — K644 Residual hemorrhoidal skin tags: Secondary | ICD-10-CM | POA: Diagnosis not present

## 2021-10-04 DIAGNOSIS — R109 Unspecified abdominal pain: Secondary | ICD-10-CM

## 2021-10-04 LAB — CMP14+EGFR
ALT: 14 IU/L (ref 0–44)
AST: 22 IU/L (ref 0–40)
Albumin/Globulin Ratio: 1.6 (ref 1.2–2.2)
Albumin: 4.2 g/dL (ref 3.5–4.6)
Alkaline Phosphatase: 97 IU/L (ref 44–121)
BUN/Creatinine Ratio: 17 (ref 10–24)
BUN: 26 mg/dL (ref 10–36)
Bilirubin Total: 0.5 mg/dL (ref 0.0–1.2)
CO2: 25 mmol/L (ref 20–29)
Calcium: 9.4 mg/dL (ref 8.6–10.2)
Chloride: 103 mmol/L (ref 96–106)
Creatinine, Ser: 1.5 mg/dL — ABNORMAL HIGH (ref 0.76–1.27)
Globulin, Total: 2.6 g/dL (ref 1.5–4.5)
Glucose: 106 mg/dL — ABNORMAL HIGH (ref 70–99)
Potassium: 3.9 mmol/L (ref 3.5–5.2)
Sodium: 142 mmol/L (ref 134–144)
Total Protein: 6.8 g/dL (ref 6.0–8.5)
eGFR: 43 mL/min/{1.73_m2} — ABNORMAL LOW (ref >59)

## 2021-10-04 LAB — CBC
Hematocrit: 37.9 % (ref 37.5–51.0)
Hemoglobin: 13.1 g/dL (ref 13.0–17.7)
MCH: 33.9 pg — ABNORMAL HIGH (ref 26.6–33.0)
MCHC: 34.6 g/dL (ref 31.5–35.7)
MCV: 98 fL — ABNORMAL HIGH (ref 79–97)
Platelets: 203 10*3/uL (ref 150–450)
RBC: 3.86 x10E6/uL — ABNORMAL LOW (ref 4.14–5.80)
RDW: 12.3 % (ref 11.6–15.4)
WBC: 4.7 10*3/uL (ref 3.4–10.8)

## 2021-10-04 LAB — POCT URINALYSIS DIPSTICK
Bilirubin, UA: NEGATIVE
Glucose, UA: NEGATIVE
Ketones, UA: NEGATIVE
Leukocytes, UA: NEGATIVE
Nitrite, UA: NEGATIVE
Protein, UA: POSITIVE — AB
Spec Grav, UA: 1.025 (ref 1.010–1.025)
Urobilinogen, UA: 0.2 E.U./dL
pH, UA: 6.5 (ref 5.0–8.0)

## 2021-10-04 LAB — POC HEMOCCULT BLD/STL (OFFICE/1-CARD/DIAGNOSTIC)
Card #1 Date: 11022022
Fecal Occult Blood, POC: POSITIVE — AB

## 2021-10-04 NOTE — Progress Notes (Signed)
I,Tianna Badgett,acting as a Education administrator for [employer], NP.,have documented all relevant documentation on the behalf of [employer], NP,as directed by  Bary Castilla, NP while in the presence of Bary Castilla, NP. [place] Kitchen This visit occurred during the SARS-CoV-2 public health emergency.  Safety protocols were in place, including screening questions prior to the visit, additional usage of staff PPE, and extensive cleaning of exam room while observing appropriate contact time as indicated for disinfecting solutions.  Subjective:     Patient ID: Wayne [name] , male    DOB: 02-26-1926 , 87 y.o.   MRN: [id number]   Chief Complaint  Patient presents with   Rectal Bleeding    HPI  Patient is here today for blood in his stool. He was straining really bad. He did not have blood yesterday. He states that he was having problems with constipation when he noticed the blood but has since started taking stool softeners. He has not noticed blood since taken the softeners. He has had hemmoroids in the past. He has some hemmoroid at home that he has been using that has helped.  He is also having some flank pain. His left side is hurting. He is not seeing a kidney specialist.   Rectal Bleeding  Pertinent negatives include no fever, no diarrhea, no hematuria, no chest pain and no headaches.    Past Medical History:  Diagnosis Date   Arthritis    Cataract    Glaucoma    Hypertension      Family History  Problem Relation Age of Onset   Dementia Mother    Cancer Father      Current Outpatient Medications:    amLODipine (NORVASC) 5 MG tablet, TAKE 1 TABLET BY MOUTH  DAILY, Disp: 90 tablet, Rfl: 3   aspirin EC 81 MG tablet, Take 81 mg by mouth daily., Disp: , Rfl:    B Complex-C (B-COMPLEX WITH VITAMIN C) tablet, Take 1 tablet by mouth daily., Disp: , Rfl:    Cholecalciferol (VITAMIN D3) 125 MCG (5000 UT) CAPS, Take by mouth daily. Monday, Wednesday, Friday, Disp: , Rfl:     diclofenac Sodium (VOLTAREN) 1 % GEL, Apply 2 g topically 4 (four) times daily., Disp: 150 g, Rfl: 1   fluocinonide cream (LIDEX) 9.56 %, Apply 1 application topically 2 (two) times daily., Disp: 30 g, Rfl: 1   gabapentin (NEURONTIN) 300 MG capsule, Take 1 capsule (300 mg total) by mouth 2 (two) times daily., Disp: 180 capsule, Rfl: 2   ketoconazole (NIZORAL) 2 % cream, Apply 1 application topically daily., Disp: 15 g, Rfl: 1   loratadine (CLARITIN) 10 MG tablet, Take 1 tablet (10 mg total) by mouth daily., Disp: 90 tablet, Rfl: 2   Multiple Vitamin (MULTIVITAMIN WITH MINERALS) TABS tablet, Take 1 tablet by mouth daily., Disp: , Rfl:    NON FORMULARY, CBD gummies, Disp: , Rfl:    Omega-3 Fatty Acids (FISH OIL) 1200 MG CAPS, Take 1 capsule by mouth daily. , Disp: , Rfl:    oxyCODONE-acetaminophen (PERCOCET/ROXICET) 5-325 MG tablet, Take 1-2 tablets by mouth daily as needed for pain. , Disp: , Rfl: 0   Red Yeast Rice Extract (RED YEAST RICE PO), Take 2 capsules by mouth daily., Disp: , Rfl:    Saw Palmetto 450 MG CAPS, Take 2 capsules by mouth daily., Disp: , Rfl:    tamsulosin (FLOMAX) 0.4 MG CAPS capsule, Take 0.4 mg by mouth daily. , Disp: , Rfl:    timolol (BETIMOL) 0.5 %  ophthalmic solution, Place 1 drop into both eyes daily. , Disp: , Rfl:    valsartan-hydrochlorothiazide (DIOVAN-HCT) 80-12.5 MG tablet, TAKE 1 TABLET BY MOUTH  DAILY (Patient taking differently: Take 2 tablets by mouth daily.), Disp: 90 tablet, Rfl: 3   Allergies  Allergen Reactions   Cephalexin     Severe mouth and throat dryness.     Review of Systems  Constitutional: Negative.  Negative for fatigue and fever.  HENT:  Negative for congestion.   Respiratory: Negative.  Negative for shortness of breath and wheezing.   Cardiovascular: Negative.  Negative for chest pain and palpitations.  Gastrointestinal:  Positive for blood in stool, constipation and hematochezia. Negative for diarrhea.  Genitourinary:  Positive for  flank pain. Negative for difficulty urinating, frequency and hematuria.  Musculoskeletal:  Negative for arthralgias and myalgias.  Neurological: Negative.  Negative for dizziness, weakness and headaches.    Today's Vitals   10/04/21 1034  BP: 128/77  Pulse: 70  Temp: 98.9 F (37.2 C)  TempSrc: Oral  Weight: 163 lb 9.6 oz (74.2 kg)  Height: 5' 3.8" (1.621 m)   Body mass index is 28.26 kg/m.  Wt Readings from Last 3 Encounters:  10/04/21 163 lb 9.6 oz (74.2 kg)  09/07/21 161 lb 9.6 oz (73.3 kg)  07/24/21 158 lb 12.8 oz (72 kg)    Objective:  Physical Exam Constitutional:      Appearance: Normal appearance.  HENT:     Head: Normocephalic and atraumatic.  Cardiovascular:     Rate and Rhythm: Normal rate and regular rhythm.     Pulses: Normal pulses.     Heart sounds: Normal heart sounds. No murmur heard. Pulmonary:     Effort: Pulmonary effort is normal. No respiratory distress.     Breath sounds: Normal breath sounds.  Genitourinary:    Comments: External hemmoroids.  Skin:    General: Skin is warm and dry.     Capillary Refill: Capillary refill takes less than 2 seconds.  Neurological:     Mental Status: He is alert.        Assessment And Plan:     1. Blood in stool - POC Hemoccult Bld/Stl (1-Cd Office Dx) - CMP14+EGFR - CBC no Diff - Ambulatory referral to Gastroenterology  2. Flank pain - POCT Urinalysis Dipstick (81002)  3. External hemorrhoid - Ambulatory referral to Gastroenterology  -Advised patient to continue using hemorrhoid cream  -Avoid straining; use stool softer as needed and increase intake of fiber and water.    Patient was given opportunity to ask questions. Patient verbalized understanding of the plan and was able to repeat key elements of the plan. All questions were answered to their satisfaction.  Bary Castilla, NP   I, Bary Castilla, NP, have reviewed all documentation for this visit. The documentation on 10/04/21 for the  exam, diagnosis, procedures, and orders are all accurate and complete.   IF YOU HAVE BEEN REFERRED TO A SPECIALIST, IT MAY TAKE 1-2 WEEKS TO SCHEDULE/PROCESS THE REFERRAL. IF YOU HAVE NOT HEARD FROM US/SPECIALIST IN TWO WEEKS, PLEASE GIVE [place] A CALL AT [phone].   THE PATIENT IS ENCOURAGED TO PRACTICE SOCIAL DISTANCING DUE TO THE COVID-19 PANDEMIC.

## 2021-10-04 NOTE — Patient Instructions (Signed)
Stool for Occult Blood Test Why am I having this test? Stool for occult blood, or fecal occult blood test (FOBT), is a test that is used to check for gastrointestinal (GI) bleeding, which may be a sign of colon cancer. This test can also detect small amounts of blood in your stool (feces) from other causes, such as ulcers, bleeding blood vessels, or hemorrhoids. This test may be done as part of an annual routine exam to screen for colorectal cancer. Screening is recommended for all adults at average riskstarting at age 45 and continuing until age 75. What is being tested? This test checks for blood in your stool. What kind of sample is taken?  A stool sample is required for this test. Your health care provider may collect the sample with a swab of the rectum. Or, you may be instructed to collect the sample in a container at home. If you are instructed to collect the sample at home, your health care provider will give you the instructions and suppliesthat you will need. How do I collect samples at home? You may be asked to collect stool samples at home. Follow instructions fromyour health care provider about how to collect the samples. When collecting a stool sample at home, make sure you: Use supplies and instructions that you received from the lab. Have a bowel movement directly into a clean, dry container. Do not collect stool from the water in the toilet. Transfer the sample into the germ-free (sterile) cup that you received from the lab. Do not let any toilet paper or urine get into the cup. Wash your hands with soap and water after collecting the sample. Make sure that the label on the specimen matches your legal, full name (do not use nicknames) and date of birth. Be sure to write your collection date and collection time clearly on the label with ink that will not smear. Return the samples to the lab as instructed. How do I prepare for this test? Do not eat any red meat during the 3 days  before your test. Follow instructions from your health care provider about eating and drinking before the test. Your health care provider may instruct you to avoid other foods or substances. Tell a health care provider about: All medicines you are taking, including vitamins, herbs, eye drops, creams, and over-the-counter medicines. You may be instructed to avoid certain medicines that are known to interfere with this test. Any recent dental procedures you have had. How are the results reported? Your test results will be reported as either positive or negative for blood inyour stool. What do the results mean? A negative test result means that there is no blood within the stool. Anegative result is considered normal. A positive test result may mean that there is blood in the stool. Causes of blood in the stool include: GI tumors. Certain GI diseases. GI trauma or recent surgery. Hemorrhoids. If your test result is positive, additional tests may be needed to find thesource of the bleeding. Talk with your health care provider about what your results mean. Questions to ask your health care provider Ask your health care provider, or the department that is doing the test: When will my results be ready? How will I get my results? What are my treatment options? What other tests do I need? What are my next steps? Summary Stool for occult blood, or fecal occult blood test (FOBT), is a test that is used to check for gastrointestinal (GI) bleeding, which may be   a sign of colon cancer. This test can also detect small amounts of blood in your stool (feces) from other causes, such as ulcers, bleeding blood vessels, or hemorrhoids. Your health care provider may collect the sample with a swab of the rectum. Or, you may be instructed to collect the sample in a container at home. A positive test result may mean that there is blood in the stool. This information is not intended to replace advice given to you  by your health care provider. Make sure you discuss any questions you have with your healthcare provider. Document Revised: 07/22/2020 Document Reviewed: 07/22/2020 Elsevier Patient Education  2022 [employer]  

## 2021-10-05 ENCOUNTER — Other Ambulatory Visit: Payer: Self-pay | Admitting: Nurse Practitioner

## 2021-10-10 ENCOUNTER — Other Ambulatory Visit: Payer: Self-pay | Admitting: Nurse Practitioner

## 2021-10-10 DIAGNOSIS — N183 Chronic kidney disease, stage 3 unspecified: Secondary | ICD-10-CM

## 2021-10-10 DIAGNOSIS — I129 Hypertensive chronic kidney disease with stage 1 through stage 4 chronic kidney disease, or unspecified chronic kidney disease: Secondary | ICD-10-CM

## 2021-10-12 ENCOUNTER — Telehealth: Payer: Self-pay

## 2021-10-12 NOTE — Telephone Encounter (Signed)
April with [facility] called stating she is there to do the start of care for home health services but he is actually wanting would be through the [place] since he gets [place] . He wants someone to help him do housework but she thinks she you need to call the [facility] and help get it set up. Her phone number is [phone] [facility]

## 2021-10-20 DIAGNOSIS — M1990 Unspecified osteoarthritis, unspecified site: Secondary | ICD-10-CM | POA: Diagnosis not present

## 2021-10-23 DIAGNOSIS — K5900 Constipation, unspecified: Secondary | ICD-10-CM | POA: Diagnosis not present

## 2021-10-23 DIAGNOSIS — K625 Hemorrhage of anus and rectum: Secondary | ICD-10-CM | POA: Diagnosis not present

## 2021-10-23 DIAGNOSIS — R194 Change in bowel habit: Secondary | ICD-10-CM | POA: Diagnosis not present

## 2021-10-31 ENCOUNTER — Encounter: Payer: Self-pay | Admitting: Nurse Practitioner

## 2021-10-31 ENCOUNTER — Other Ambulatory Visit: Payer: Self-pay

## 2021-10-31 ENCOUNTER — Ambulatory Visit (INDEPENDENT_AMBULATORY_CARE_PROVIDER_SITE_OTHER): Payer: Medicare Other | Admitting: Nurse Practitioner

## 2021-10-31 VITALS — BP 134/80 | HR 76 | Temp 98.6°F | Ht 63.0 in | Wt 166.6 lb

## 2021-10-31 DIAGNOSIS — N184 Chronic kidney disease, stage 4 (severe): Secondary | ICD-10-CM

## 2021-10-31 DIAGNOSIS — I129 Hypertensive chronic kidney disease with stage 1 through stage 4 chronic kidney disease, or unspecified chronic kidney disease: Secondary | ICD-10-CM

## 2021-10-31 DIAGNOSIS — H6123 Impacted cerumen, bilateral: Secondary | ICD-10-CM | POA: Diagnosis not present

## 2021-10-31 DIAGNOSIS — R109 Unspecified abdominal pain: Secondary | ICD-10-CM | POA: Diagnosis not present

## 2021-10-31 LAB — POCT URINALYSIS DIPSTICK
Bilirubin, UA: NEGATIVE
Blood, UA: NEGATIVE
Glucose, UA: NEGATIVE
Ketones, UA: NEGATIVE
Leukocytes, UA: NEGATIVE
Nitrite, UA: NEGATIVE
Protein, UA: POSITIVE — AB
Spec Grav, UA: 1.02 (ref 1.010–1.025)
Urobilinogen, UA: 0.2 E.U./dL
pH, UA: 5.5 (ref 5.0–8.0)

## 2021-10-31 NOTE — Progress Notes (Signed)
I,Victoria T Hamilton,acting as a Education administrator for [employer], NP.,have documented all relevant documentation on the behalf of [employer], NP,as directed by  Bary Castilla, NP while in the presence of Bary Castilla, NP.  This visit occurred during the SARS-CoV-2 public health emergency.  Safety protocols were in place, including screening questions prior to the visit, additional usage of staff PPE, and extensive cleaning of exam room while observing appropriate contact time as indicated for disinfecting solutions.  Subjective:     Patient ID: Wayne [name] , male    DOB: 12/15/1925 , 87 y.o.   MRN: [id number]   Chief Complaint  Patient presents with   Ear Problem     HPI  Pt here today for ear cleaning. He received a letter in the mail saying he was due for a hearing test & needed a cleaning because of built up wax. He has a concern about his left kidney, while he was visiting Dr. Collene Mares he was told by her that it would be reported to his PCP. While he is here today, he would like to discuss more.     Past Medical History:  Diagnosis Date   Arthritis    Cataract    Glaucoma    Hypertension      Family History  Problem Relation Age of Onset   Dementia Mother    Cancer Father      Current Outpatient Medications:    amLODipine (NORVASC) 5 MG tablet, TAKE 1 TABLET BY MOUTH  DAILY, Disp: 90 tablet, Rfl: 3   aspirin EC 81 MG tablet, Take 81 mg by mouth daily., Disp: , Rfl:    B Complex-C (B-COMPLEX WITH VITAMIN C) tablet, Take 1 tablet by mouth daily., Disp: , Rfl:    Cholecalciferol (VITAMIN D3) 125 MCG (5000 UT) CAPS, Take by mouth daily. Monday, Wednesday, Friday, Disp: , Rfl:    diclofenac Sodium (VOLTAREN) 1 % GEL, Apply 2 g topically 4 (four) times daily., Disp: 150 g, Rfl: 1   fluocinonide cream (LIDEX) 5.39 %, Apply 1 application topically 2 (two) times daily., Disp: 30 g, Rfl: 1   gabapentin (NEURONTIN) 300 MG capsule, Take 1 capsule (300 mg total) by  mouth 2 (two) times daily., Disp: 180 capsule, Rfl: 2   ketoconazole (NIZORAL) 2 % cream, Apply 1 application topically daily., Disp: 15 g, Rfl: 1   Multiple Vitamin (MULTIVITAMIN WITH MINERALS) TABS tablet, Take 1 tablet by mouth daily., Disp: , Rfl:    NON FORMULARY, CBD gummies, Disp: , Rfl:    Omega-3 Fatty Acids (FISH OIL) 1200 MG CAPS, Take 1 capsule by mouth daily. , Disp: , Rfl:    oxyCODONE-acetaminophen (PERCOCET/ROXICET) 5-325 MG tablet, Take 1-2 tablets by mouth daily as needed for pain. , Disp: , Rfl: 0   Red Yeast Rice Extract (RED YEAST RICE PO), Take 2 capsules by mouth daily., Disp: , Rfl:    Saw Palmetto 450 MG CAPS, Take 2 capsules by mouth daily., Disp: , Rfl:    tamsulosin (FLOMAX) 0.4 MG CAPS capsule, Take 0.4 mg by mouth daily. , Disp: , Rfl:    timolol (BETIMOL) 0.5 % ophthalmic solution, Place 1 drop into both eyes daily. , Disp: , Rfl:    valsartan-hydrochlorothiazide (DIOVAN-HCT) 80-12.5 MG tablet, TAKE 1 TABLET BY MOUTH  DAILY (Patient taking differently: Take 2 tablets by mouth daily.), Disp: 90 tablet, Rfl: 3   loratadine (CLARITIN) 10 MG tablet, Take 1 tablet (10 mg total) by mouth daily., Disp: 90 tablet,  Rfl: 2   Allergies  Allergen Reactions   Cephalexin     Severe mouth and throat dryness.     Review of Systems  Constitutional:  Negative for chills and fever.  HENT:  Positive for hearing loss. Negative for congestion.   Respiratory:  Negative for cough, shortness of breath and wheezing.   Cardiovascular:  Negative for chest pain and palpitations.  Musculoskeletal:  Positive for back pain.       Flank pain     Today's Vitals   10/31/21 1518  BP: 134/80  Pulse: 76  Temp: 98.6 F (37 C)  Weight: 166 lb 9.6 oz (75.6 kg)  Height: 5\' 3"  (1.6 m)   Body mass index is 29.51 kg/m.  Wt Readings from Last 3 Encounters:  10/31/21 166 lb 9.6 oz (75.6 kg)  10/04/21 163 lb 9.6 oz (74.2 kg)  09/07/21 161 lb 9.6 oz (73.3 kg)    Objective:  Physical  Exam Constitutional:      Appearance: Normal appearance.  HENT:     Head: Normocephalic and atraumatic.     Right Ear: There is impacted cerumen.     Left Ear: There is impacted cerumen.  Cardiovascular:     Pulses: Normal pulses.     Heart sounds: Normal heart sounds. No murmur heard. Pulmonary:     Effort: Pulmonary effort is normal. No respiratory distress.     Breath sounds: No wheezing.  Skin:    General: Skin is warm and dry.     Capillary Refill: Capillary refill takes less than 2 seconds.  Neurological:     Mental Status: He is alert.        Assessment And Plan:     1. Hearing loss due to cerumen impaction, bilateral -Will clean both of his ears.   2. Bilateral impacted cerumen -Used a curette to flush both ears.   3. Left flank pain - POCT Urinalysis Dipstick (81002)  4. Hypertensive kidney disease with chronic kidney disease stage IV [facility]) - Ambulatory referral to Nephrology   The patient was encouraged to call or send a message through [place] for any questions or concerns.   Follow up: if symptoms persist or do not get better.   Side effects and appropriate use of all the medication(s) were discussed with the patient today. Patient advised to use the medication(s) as directed by their healthcare provider. The patient was encouraged to read, review, and understand all associated package inserts and contact our office with any questions or concerns. The patient accepts the risks of the treatment plan and had an opportunity to ask questions.   Patient was given opportunity to ask questions. Patient verbalized understanding of the plan and was able to repeat key elements of the plan. All questions were answered to their satisfaction.  Raman Ovie Cornelio, DNP   I, Raman Smera Guyette have reviewed all documentation for this visit. The documentation on 10/31/21 for the exam, diagnosis, procedures, and orders are all accurate and complete.   IF YOU HAVE BEEN REFERRED TO A  SPECIALIST, IT MAY TAKE 1-2 WEEKS TO SCHEDULE/PROCESS THE REFERRAL. IF YOU HAVE NOT HEARD FROM US/SPECIALIST IN TWO WEEKS, PLEASE GIVE [place] A CALL AT [phone].   THE PATIENT IS ENCOURAGED TO PRACTICE SOCIAL DISTANCING DUE TO THE COVID-19 PANDEMIC.

## 2021-11-02 ENCOUNTER — Telehealth: Payer: Self-pay

## 2021-11-02 NOTE — Telephone Encounter (Signed)
The patient was given his most recent lab results.

## 2021-11-06 DIAGNOSIS — M1711 Unilateral primary osteoarthritis, right knee: Secondary | ICD-10-CM | POA: Diagnosis not present

## 2021-11-14 ENCOUNTER — Telehealth: Payer: Self-pay

## 2021-11-14 NOTE — Chronic Care Management (AMB) (Signed)
Chronic Care Management Pharmacy Assistant   Name: Wayne [name]  MRN: [id number] DOB: [birth year]   Reason for Encounter: Disease State/ Hypertension  Recent office visits:  07-24-2021 Glendale Chard, MD. Follow up in 6 weeks.  08-29-2021 Blanca Friend, CMA. Flu vaccine given.  09-07-2021 Glendale Chard, MD. INCREASE Gabapentin 300 mg nightly TO 300 mg twice daily. Tdap ordered.  10-04-2021 Ghumman, Ramandeep, NP. RBC= 3.86, MCV= 98, MCH= 33.9. Glucose= 106, Creatinine= 1.50, eGFR= 43. Positive labs for blood in stool. Abnormal UA. Referral placed to Gastroenterology.  10-31-2021 Bary Castilla, NP. Abnormal UA. Referral placed to nephrology.  Recent consult visits:  09-04-2021 Lorenda Peck, MD (Podiatry). DG foot complete left and right ordered.  Hospital visits:  None in previous 6 months  Medications: Outpatient Encounter Medications as of 11/14/2021  Medication Sig   amLODipine (NORVASC) 5 MG tablet TAKE 1 TABLET BY MOUTH  DAILY   aspirin EC 81 MG tablet Take 81 mg by mouth daily.   B Complex-C (B-COMPLEX WITH VITAMIN C) tablet Take 1 tablet by mouth daily.   Cholecalciferol (VITAMIN D3) 125 MCG (5000 UT) CAPS Take by mouth daily. Monday, Wednesday, Friday   diclofenac Sodium (VOLTAREN) 1 % GEL Apply 2 g topically 4 (four) times daily.   fluocinonide cream (LIDEX) 5.17 % Apply 1 application topically 2 (two) times daily.   gabapentin (NEURONTIN) 300 MG capsule Take 1 capsule (300 mg total) by mouth 2 (two) times daily.   ketoconazole (NIZORAL) 2 % cream Apply 1 application topically daily.   loratadine (CLARITIN) 10 MG tablet Take 1 tablet (10 mg total) by mouth daily.   Multiple Vitamin (MULTIVITAMIN WITH MINERALS) TABS tablet Take 1 tablet by mouth daily.   NON FORMULARY CBD gummies   Omega-3 Fatty Acids (FISH OIL) 1200 MG CAPS Take 1 capsule by mouth daily.    oxyCODONE-acetaminophen (PERCOCET/ROXICET) 5-325 MG tablet Take 1-2 tablets by mouth  daily as needed for pain.    Red Yeast Rice Extract (RED YEAST RICE PO) Take 2 capsules by mouth daily.   Saw Palmetto 450 MG CAPS Take 2 capsules by mouth daily.   tamsulosin (FLOMAX) 0.4 MG CAPS capsule Take 0.4 mg by mouth daily.    timolol (BETIMOL) 0.5 % ophthalmic solution Place 1 drop into both eyes daily.    valsartan-hydrochlorothiazide (DIOVAN-HCT) 80-12.5 MG tablet TAKE 1 TABLET BY MOUTH  DAILY (Patient taking differently: Take 2 tablets by mouth daily.)   No facility-administered encounter medications on file as of 11/14/2021.  Reviewed chart prior to disease state call. Spoke with patient regarding BP  Recent Office Vitals: BP Readings from Last 3 Encounters:  10/31/21 134/80  10/04/21 128/77  09/07/21 (!) 144/70   Pulse Readings from Last 3 Encounters:  10/31/21 76  10/04/21 70  09/07/21 63    Wt Readings from Last 3 Encounters:  10/31/21 166 lb 9.6 oz (75.6 kg)  10/04/21 163 lb 9.6 oz (74.2 kg)  09/07/21 161 lb 9.6 oz (73.3 kg)     Kidney Function Lab Results  Component Value Date/Time   CREATININE 1.50 (H) 10/04/2021 10:54 AM   CREATININE 1.37 (H) 05/30/2021 02:44 PM   GFRNONAA 47 (L) 01/23/2021 04:36 PM   GFRAA 55 (L) 01/23/2021 04:36 PM    BMP Latest Ref Rng & Units 10/04/2021 05/30/2021 01/23/2021  Glucose 70 - 99 mg/dL 106(H) 143(H) 88  BUN 10 - 36 mg/dL '26 21 22  ' Creatinine 0.76 - 1.27 mg/dL 1.50(H) 1.37(H) 1.30(H)  BUN/Creat Ratio  10 - '24 17 15 17  ' Sodium 134 - 144 mmol/L 142 142 145(H)  Potassium 3.5 - 5.2 mmol/L 3.9 3.9 4.6  Chloride 96 - 106 mmol/L 103 104 105  CO2 20 - 29 mmol/L '25 23 24  ' Calcium 8.6 - 10.2 mg/dL 9.4 9.4 9.5    Current antihypertensive regimen:  Amlodipine 5 mg daily Valsartan-Hydrochlorothiazide 80-12.5 mg twice daily  How often are you checking your Blood Pressure? infrequently  Current home BP readings: 134/74  What recent interventions/DTPs have been made by any provider to improve Blood Pressure control since last  CPP Visit:  Educated on Importance of home blood pressure monitoring; Proper BP monitoring technique; -Counseled to monitor BP at home at least three times per week, document, and provide log at future appointments  Any recent hospitalizations or ED visits since last visit with CPP? No  What diet changes have been made to improve Blood Pressure Control?  Patient stated he limits his sodium intake, drinks plenty of water daily and tries to eat fruits and vegetables.  What exercise is being done to improve your Blood Pressure Control?  Patient stated he lifts light weighted dumbbells and walks around some. Patient stated he has arthritis pain in feet so he can't walk as much as he wants.  Adherence Review: Is the patient currently on ACE/ARB medication? Yes Does the patient have >5 day gap between last estimated fill dates? Yes  NOTES: Patient stated [facility] was sending extra medication and he has a lot of bottles. Asked patient to look at the date on the bottles and 2 were expired. The current bottle that was filled 12-29-2020 is a 90 day supply which will expire soon. Instructed patient to stop taking the expired medication and take the bottle filled on 12-29-2020 until 12-29-2021. Patient stated he knows a place to drop off old medications. Informed patient that I will request a new script to be sent to [facility]. Message sent to [facility] CMA.   Care Gaps: PNA Vac overdue Covid booster overdue Tdap overdue AWV 01-11-2022  Star Rating Drugs: Valsartan-HCTZ 80-12.5 mg- Last filled 12-29-2020 90 DS [facility] (Called pharmacy and last filled on 12-29-2020 and script is expired). [place] Pharmacist Assistant [phone]

## 2021-11-15 ENCOUNTER — Other Ambulatory Visit: Payer: Self-pay

## 2021-11-15 MED ORDER — VALSARTAN-HYDROCHLOROTHIAZIDE 80-12.5 MG PO TABS: 2.0000 | ORAL_TABLET | Freq: Every day | ORAL | 2 refills | Status: DC

## 2021-11-22 ENCOUNTER — Other Ambulatory Visit: Payer: Self-pay

## 2021-11-22 MED ORDER — VALSARTAN-HYDROCHLOROTHIAZIDE 80-12.5 MG PO TABS: 1.0000 | ORAL_TABLET | Freq: Every day | ORAL | 2 refills | Status: DC

## 2021-12-26 DIAGNOSIS — H33052 Total retinal detachment, left eye: Secondary | ICD-10-CM | POA: Diagnosis not present

## 2021-12-26 DIAGNOSIS — H26492 Other secondary cataract, left eye: Secondary | ICD-10-CM | POA: Diagnosis not present

## 2021-12-26 DIAGNOSIS — H02403 Unspecified ptosis of bilateral eyelids: Secondary | ICD-10-CM | POA: Diagnosis not present

## 2021-12-26 DIAGNOSIS — H04123 Dry eye syndrome of bilateral lacrimal glands: Secondary | ICD-10-CM | POA: Diagnosis not present

## 2021-12-26 DIAGNOSIS — H401131 Primary open-angle glaucoma, bilateral, mild stage: Secondary | ICD-10-CM | POA: Diagnosis not present

## 2021-12-26 DIAGNOSIS — Z961 Presence of intraocular lens: Secondary | ICD-10-CM | POA: Diagnosis not present

## 2022-01-04 ENCOUNTER — Ambulatory Visit: Payer: Medicare Other

## 2022-01-04 ENCOUNTER — Ambulatory Visit: Payer: Medicare Other | Admitting: Internal Medicine

## 2022-01-04 DIAGNOSIS — N261 Atrophy of kidney (terminal): Secondary | ICD-10-CM | POA: Diagnosis not present

## 2022-01-04 DIAGNOSIS — R609 Edema, unspecified: Secondary | ICD-10-CM | POA: Diagnosis not present

## 2022-01-04 DIAGNOSIS — N2581 Secondary hyperparathyroidism of renal origin: Secondary | ICD-10-CM | POA: Diagnosis not present

## 2022-01-04 DIAGNOSIS — R809 Proteinuria, unspecified: Secondary | ICD-10-CM | POA: Diagnosis not present

## 2022-01-04 DIAGNOSIS — I129 Hypertensive chronic kidney disease with stage 1 through stage 4 chronic kidney disease, or unspecified chronic kidney disease: Secondary | ICD-10-CM | POA: Diagnosis not present

## 2022-01-04 DIAGNOSIS — N1832 Chronic kidney disease, stage 3b: Secondary | ICD-10-CM | POA: Diagnosis not present

## 2022-01-04 DIAGNOSIS — D631 Anemia in chronic kidney disease: Secondary | ICD-10-CM | POA: Diagnosis not present

## 2022-01-09 ENCOUNTER — Telehealth: Payer: Self-pay

## 2022-01-09 NOTE — Chronic Care Management (AMB) (Signed)
°  Wayne [name] was reminded to have all medications, supplements and any blood glucose and blood pressure readings available for review with Orlando Penner, Pharm. D, at his telephone visit on 01-16-2022 at 2:00.  Questions: Have you had any recent office visit or specialist visit outside of [place]? Patient stated [place] [facility] last Thursday.  Are there any concerns you would like to discuss during your office visit? Patient stated no.  Are you having any problems obtaining your medications? (Whether it pharmacy issues or cost) Patient stated no  If patient has any PAP medications ask if they are having any problems getting their PAP medication or refill? No PAP medications  Care Gaps: PNA Vac overdue Covid booster overdue Tdap overdue AWV 01-11-2022  Star Rating Drug: Valsartan-HCTZ 80-12.5 mg- Last filled 11-22-2021 90 DS [facility]  Any gaps in medications fill history? No  [place] Pharmacist Assistant [phone]

## 2022-01-11 ENCOUNTER — Encounter: Payer: Self-pay | Admitting: Internal Medicine

## 2022-01-11 ENCOUNTER — Ambulatory Visit (INDEPENDENT_AMBULATORY_CARE_PROVIDER_SITE_OTHER): Payer: Medicare Other

## 2022-01-11 ENCOUNTER — Ambulatory Visit (INDEPENDENT_AMBULATORY_CARE_PROVIDER_SITE_OTHER): Payer: Medicare Other | Admitting: Internal Medicine

## 2022-01-11 ENCOUNTER — Other Ambulatory Visit: Payer: Self-pay

## 2022-01-11 VITALS — BP 124/68 | HR 65 | Temp 98.2°F | Ht <= 58 in | Wt 164.4 lb

## 2022-01-11 VITALS — BP 124/68 | HR 65 | Temp 98.2°F | Ht <= 58 in | Wt 164.5 lb

## 2022-01-11 DIAGNOSIS — Z0000 Encounter for general adult medical examination without abnormal findings: Secondary | ICD-10-CM | POA: Diagnosis not present

## 2022-01-11 DIAGNOSIS — M19042 Primary osteoarthritis, left hand: Secondary | ICD-10-CM | POA: Diagnosis not present

## 2022-01-11 DIAGNOSIS — M19041 Primary osteoarthritis, right hand: Secondary | ICD-10-CM | POA: Diagnosis not present

## 2022-01-11 DIAGNOSIS — I701 Atherosclerosis of renal artery: Secondary | ICD-10-CM | POA: Diagnosis not present

## 2022-01-11 DIAGNOSIS — R682 Dry mouth, unspecified: Secondary | ICD-10-CM | POA: Diagnosis not present

## 2022-01-11 DIAGNOSIS — I129 Hypertensive chronic kidney disease with stage 1 through stage 4 chronic kidney disease, or unspecified chronic kidney disease: Secondary | ICD-10-CM

## 2022-01-11 DIAGNOSIS — Z6834 Body mass index (BMI) 34.0-34.9, adult: Secondary | ICD-10-CM

## 2022-01-11 DIAGNOSIS — N184 Chronic kidney disease, stage 4 (severe): Secondary | ICD-10-CM

## 2022-01-11 DIAGNOSIS — E6609 Other obesity due to excess calories: Secondary | ICD-10-CM

## 2022-01-11 NOTE — Progress Notes (Signed)
Rich Brave [place]leton,acting as a Education administrator for [name], MD.,have documented all relevant documentation on the behalf of [name], MD,as directed by  [name], MD while in the presence of [name], MD.  This visit occurred during the SARS-CoV-2 public health emergency.  Safety protocols were in place, including screening questions prior to the visit, additional usage of staff PPE, and extensive cleaning of exam room while observing appropriate contact time as indicated for disinfecting solutions.  Subjective:     Patient ID: Wayne [name] , male    DOB: [birth year] , 88 y.o.   MRN: [id number]   Chief Complaint  Patient presents with   Hypertension    HPI  Patient presents today for a bp check. He reports compliance with meds. Denies headaches, chest pain and shortness of breath. He is also scheduled for AWV with [facility] Advisor.   Hypertension This is a chronic problem. The current episode started more than 1 year ago. The problem has been gradually improving since onset. The problem is controlled. Pertinent negatives include no blurred vision, palpitations or shortness of breath. Past treatments include calcium channel blockers, angiotensin blockers, diuretics and lifestyle changes. The current treatment provides moderate improvement. Hypertensive end-organ damage includes kidney disease.    Past Medical History:  Diagnosis Date   Arthritis    Cataract    Glaucoma    Hypertension      Family History  Problem Relation Age of Onset   Dementia Mother    Cancer Father      Current Outpatient Medications:    amLODipine (NORVASC) 5 MG tablet, TAKE 1 TABLET BY MOUTH  DAILY, Disp: 90 tablet, Rfl: 3   aspirin EC 81 MG tablet, Take 81 mg by mouth daily., Disp: , Rfl:    atorvastatin (LIPITOR) 20 MG tablet, TAKE M,W,F ONLY., Disp: 45 tablet, Rfl: 11   B Complex-C (B-COMPLEX WITH VITAMIN C) tablet, Take 1 tablet by mouth daily., Disp: , Rfl:    Cholecalciferol  (VITAMIN D3) 125 MCG (5000 UT) CAPS, Take by mouth daily. Monday, Wednesday, Friday, Disp: , Rfl:    diclofenac Sodium (VOLTAREN) 1 % GEL, Apply 2 g topically 4 (four) times daily., Disp: 150 g, Rfl: 1   fluocinonide cream (LIDEX) 2.99 %, Apply 1 application topically 2 (two) times daily., Disp: 30 g, Rfl: 1   furosemide (LASIX) 40 MG tablet, Take 40 mg by mouth daily., Disp: , Rfl:    gabapentin (NEURONTIN) 300 MG capsule, Take 1 capsule (300 mg total) by mouth 2 (two) times daily., Disp: 180 capsule, Rfl: 2   ketoconazole (NIZORAL) 2 % cream, Apply 1 application topically daily., Disp: 15 g, Rfl: 1   loratadine (CLARITIN) 10 MG tablet, Take 1 tablet (10 mg total) by mouth daily., Disp: 90 tablet, Rfl: 2   Multiple Vitamin (MULTIVITAMIN WITH MINERALS) TABS tablet, Take 1 tablet by mouth daily., Disp: , Rfl:    NON FORMULARY, CBD gummies, Disp: , Rfl:    Omega-3 Fatty Acids (FISH OIL) 1200 MG CAPS, Take 1 capsule by mouth daily. , Disp: , Rfl:    oxyCODONE-acetaminophen (PERCOCET/ROXICET) 5-325 MG tablet, Take 1-2 tablets by mouth daily as needed for pain. , Disp: , Rfl: 0   Saw Palmetto 450 MG CAPS, Take 2 capsules by mouth daily., Disp: , Rfl:    senna (SENOKOT) 8.6 MG tablet, Take 1 tablet by mouth daily., Disp: , Rfl:    tamsulosin (FLOMAX) 0.4 MG CAPS capsule, Take 0.4  mg by mouth daily. , Disp: , Rfl:    timolol (BETIMOL) 0.5 % ophthalmic solution, Place 1 drop into both eyes daily. , Disp: , Rfl:    valsartan-hydrochlorothiazide (DIOVAN-HCT) 80-12.5 MG tablet, Take 1 tablet by mouth daily., Disp: 90 tablet, Rfl: 2   Allergies  Allergen Reactions   Cephalexin     Severe mouth and throat dryness.     Review of Systems  Constitutional: Negative.   HENT:  Positive for dental problem.        He c/o dry mouth. Admits he is always thirsty. Denies change in meds.   Eyes:  Negative for blurred vision.  Respiratory: Negative.  Negative for shortness of breath.   Cardiovascular: Negative.   Negative for palpitations.  Gastrointestinal: Negative.   Musculoskeletal:  Positive for arthralgias.       He c/o persistent arthralgias, esp in his hands.   Neurological: Negative.   Psychiatric/Behavioral: Negative.      Today's Vitals   01/11/22 1417  BP: 124/68  Pulse: 65  Temp: 98.2 F (36.8 C)  Weight: 164 lb 7.4 oz (74.6 kg)  Height: 4' 9.6" (1.463 m)   Body mass index is 34.85 kg/m.  Wt Readings from Last 3 Encounters:  01/11/22 164 lb 7.4 oz (74.6 kg)  01/11/22 164 lb 6.4 oz (74.6 kg)  10/31/21 166 lb 9.6 oz (75.6 kg)     Objective:  Physical Exam Vitals and nursing note reviewed.  Constitutional:      Appearance: Normal appearance.  HENT:     Head: Normocephalic and atraumatic.     Nose:     Comments: Masked     Mouth/Throat:     Comments: Masked Eyes:     Extraocular Movements: Extraocular movements intact.  Cardiovascular:     Rate and Rhythm: Normal rate and regular rhythm.     Heart sounds: Normal heart sounds.  Pulmonary:     Effort: Pulmonary effort is normal.     Breath sounds: Normal breath sounds.  Musculoskeletal:        General: Swelling present.     Cervical back: Normal range of motion.  Skin:    General: Skin is warm.  Neurological:     General: No focal deficit present.     Mental Status: He is alert.  Psychiatric:        Mood and Affect: Mood normal.        Assessment And Plan:     1. Hypertensive kidney disease with chronic kidney disease stage IV (HCC) Comments: Chronic, well controlled. He is now on "water pill' per Renal. Scheduled to have renal u/s in May with Renal. He is reminded to avoid NSAIDS due to CKD.  2. Primary osteoarthritis of both hands Comments: Chronic, encouraged to follow low sodium diet. He is advised to take Tylenol/acetaminophen prn.   3. Atherosclerosis of renal artery (HCC) Comments: Chronic, LDL goal is <70. He was recently started on statin. He is encouraged to limit fried foods, increase daily  activity and comply with statin use.  - Lipid panel  4. Dry mouth Comments: He was advised OTC Biotene and to keep glass of ice at his bedside.  5. Class 1 obesity due to excess calories with serious comorbidity and body mass index (BMI) of 34.0 to 34.9 in adult Comments: He is encouraged to strive for BMI<30 to decrease cardiac risk.    Patient was given opportunity to ask questions. Patient verbalized understanding of the plan and was able  to repeat key elements of the plan. All questions were answered to their satisfaction.   I, [name], MD, have reviewed all documentation for this visit. The documentation on 01/11/22 for the exam, diagnosis, procedures, and orders are all accurate and complete.   IF YOU HAVE BEEN REFERRED TO A SPECIALIST, IT MAY TAKE 1-2 WEEKS TO SCHEDULE/PROCESS THE REFERRAL. IF YOU HAVE NOT HEARD FROM US/SPECIALIST IN TWO WEEKS, PLEASE GIVE [place] A CALL AT [phone] X [phone].   THE PATIENT IS ENCOURAGED TO PRACTICE SOCIAL DISTANCING DUE TO THE COVID-19 PANDEMIC.

## 2022-01-11 NOTE — Patient Instructions (Signed)
Wayne [name] , Thank you for taking time to come for your Medicare Wellness Visit. I appreciate your ongoing commitment to your health goals. Please review the following plan we discussed and let me know if I can assist you in the future.   Screening recommendations/referrals: Colonoscopy: not required Recommended yearly ophthalmology/optometry visit for glaucoma screening and checkup Recommended yearly dental visit for hygiene and checkup  Vaccinations: Influenza vaccine: completed 08/29/2021, due next flu season Pneumococcal vaccine: completed 08/27/2019 Tdap vaccine: completed 09/26/2021, due 09/27/2031 Shingles vaccine: completed   Covid-19:  05/29/2021, 09/30/2020, 01/24/2020, 12/27/2019  Advanced directives: Please bring a copy of your POA (Power of Attorney) and/or Living Will to your next appointment.   Conditions/risks identified: none  Next appointment: Follow up in one year for your annual wellness visit.   Preventive Care 86 Years and Older, Male Preventive care refers to lifestyle choices and visits with your health care provider that can promote health and wellness. What does preventive care include? A yearly physical exam. This is also called an annual well check. Dental exams once or twice a year. Routine eye exams. Ask your health care provider how often you should have your eyes checked. Personal lifestyle choices, including: Daily care of your teeth and gums. Regular physical activity. Eating a healthy diet. Avoiding tobacco and drug use. Limiting alcohol use. Practicing safe sex. Taking low doses of aspirin every day. Taking vitamin and mineral supplements as recommended by your health care provider. What happens during an annual well check? The services and screenings done by your health care provider during your annual well check will depend on your age, overall health, lifestyle risk factors, and family history of disease. Counseling  Your health care provider  may ask you questions about your: Alcohol use. Tobacco use. Drug use. Emotional well-being. Home and relationship well-being. Sexual activity. Eating habits. History of falls. Memory and ability to understand (cognition). Work and work Statistician. Screening  You may have the following tests or measurements: Height, weight, and BMI. Blood pressure. Lipid and cholesterol levels. These may be checked every 5 years, or more frequently if you are over 29 years old. Skin check. Lung cancer screening. You may have this screening every year starting at age 55 if you have a 30-pack-year history of smoking and currently smoke or have quit within the past 15 years. Fecal occult blood test (FOBT) of the stool. You may have this test every year starting at age 93. Flexible sigmoidoscopy or colonoscopy. You may have a sigmoidoscopy every 5 years or a colonoscopy every 10 years starting at age 53. Prostate cancer screening. Recommendations will vary depending on your family history and other risks. Hepatitis C blood test. Hepatitis B blood test. Sexually transmitted disease (STD) testing. Diabetes screening. This is done by checking your blood sugar (glucose) after you have not eaten for a while (fasting). You may have this done every 1-3 years. Abdominal aortic aneurysm (AAA) screening. You may need this if you are a current or former smoker. Osteoporosis. You may be screened starting at age 24 if you are at high risk. Talk with your health care provider about your test results, treatment options, and if necessary, the need for more tests. Vaccines  Your health care provider may recommend certain vaccines, such as: Influenza vaccine. This is recommended every year. Tetanus, diphtheria, and acellular pertussis (Tdap, Td) vaccine. You may need a Td booster every 10 years. Zoster vaccine. You may need this after age 75. Pneumococcal 13-valent conjugate (PCV13)  vaccine. One dose is recommended after  age 16. Pneumococcal polysaccharide (PPSV23) vaccine. One dose is recommended after age 84. Talk to your health care provider about which screenings and vaccines you need and how often you need them. This information is not intended to replace advice given to you by your health care provider. Make sure you discuss any questions you have with your health care provider. Document Released: 12/16/2015 Document Revised: 08/08/2016 Document Reviewed: 09/20/2015 Elsevier Interactive Patient Education  2017 [employer] Prevention in the Home Falls can cause injuries. They can happen to people of all ages. There are many things you can do to make your home safe and to help prevent falls. What can I do on the outside of my home? Regularly fix the edges of walkways and driveways and fix any cracks. Remove anything that might make you trip as you walk through a door, such as a raised step or threshold. Trim any bushes or trees on the path to your home. Use bright outdoor lighting. Clear any walking paths of anything that might make someone trip, such as rocks or tools. Regularly check to see if handrails are loose or broken. Make sure that both sides of any steps have handrails. Any raised decks and porches should have guardrails on the edges. Have any leaves, snow, or ice cleared regularly. Use sand or salt on walking paths during winter. Clean up any spills in your garage right away. This includes oil or grease spills. What can I do in the bathroom? Use night lights. Install grab bars by the toilet and in the tub and shower. Do not use towel bars as grab bars. Use non-skid mats or decals in the tub or shower. If you need to sit down in the shower, use a plastic, non-slip stool. Keep the floor dry. Clean up any water that spills on the floor as soon as it happens. Remove soap buildup in the tub or shower regularly. Attach bath mats securely with double-sided non-slip rug tape. Do not have  throw rugs and other things on the floor that can make you trip. What can I do in the bedroom? Use night lights. Make sure that you have a light by your bed that is easy to reach. Do not use any sheets or blankets that are too big for your bed. They should not hang down onto the floor. Have a firm chair that has side arms. You can use this for support while you get dressed. Do not have throw rugs and other things on the floor that can make you trip. What can I do in the kitchen? Clean up any spills right away. Avoid walking on wet floors. Keep items that you use a lot in easy-to-reach places. If you need to reach something above you, use a strong step stool that has a grab bar. Keep electrical cords out of the way. Do not use floor polish or wax that makes floors slippery. If you must use wax, use non-skid floor wax. Do not have throw rugs and other things on the floor that can make you trip. What can I do with my stairs? Do not leave any items on the stairs. Make sure that there are handrails on both sides of the stairs and use them. Fix handrails that are broken or loose. Make sure that handrails are as long as the stairways. Check any carpeting to make sure that it is firmly attached to the stairs. Fix any carpet that is loose  or worn. Avoid having throw rugs at the top or bottom of the stairs. If you do have throw rugs, attach them to the floor with carpet tape. Make sure that you have a light switch at the top of the stairs and the bottom of the stairs. If you do not have them, ask someone to add them for you. What else can I do to help prevent falls? Wear shoes that: Do not have high heels. Have rubber bottoms. Are comfortable and fit you well. Are closed at the toe. Do not wear sandals. If you use a stepladder: Make sure that it is fully opened. Do not climb a closed stepladder. Make sure that both sides of the stepladder are locked into place. Ask someone to hold it for you, if  possible. Clearly mark and make sure that you can see: Any grab bars or handrails. First and last steps. Where the edge of each step is. Use tools that help you move around (mobility aids) if they are needed. These include: Canes. Walkers. Scooters. Crutches. Turn on the lights when you go into a dark area. Replace any light bulbs as soon as they burn out. Set up your furniture so you have a clear path. Avoid moving your furniture around. If any of your floors are uneven, fix them. If there are any pets around you, be aware of where they are. Review your medicines with your doctor. Some medicines can make you feel dizzy. This can increase your chance of falling. Ask your doctor what other things that you can do to help prevent falls. This information is not intended to replace advice given to you by your health care provider. Make sure you discuss any questions you have with your health care provider. Document Released: 09/15/2009 Document Revised: 04/26/2016 Document Reviewed: 12/24/2014 Elsevier Interactive Patient Education  2017 [employer].

## 2022-01-11 NOTE — Progress Notes (Signed)
This visit occurred during the SARS-CoV-2 public health emergency.  Safety protocols were in place, including screening questions prior to the visit, additional usage of staff PPE, and extensive cleaning of exam room while observing appropriate contact time as indicated for disinfecting solutions.  Subjective:   Wayne [name] is a 88 y.o. male who presents for Medicare Annual/Subsequent preventive examination.  Review of Systems     Cardiac Risk Factors include: advanced age (>36men, >46 women);dyslipidemia;hypertension;obesity (BMI >30kg/m2);male gender     Objective:    Today's Vitals   01/11/22 1407  BP: 124/68  Pulse: 65  Temp: 98.2 F (36.8 C)  TempSrc: Oral  SpO2: 96%  Weight: 164 lb 6.4 oz (74.6 kg)  Height: 4' 9.6" (1.463 m)   Body mass index is 34.84 kg/m.  Advanced Directives 01/11/2022 01/04/2021 12/30/2019 12/25/2018 08/07/2018 08/10/2017 11/22/2015  Does Patient Have a Medical Advance Directive? Yes Yes Yes Yes No Yes No  Type of Paramedic of [place];Living will [place];Living will Living will [place];Living will - Living will -  Does patient want to make changes to medical advance directive? - - - No - Patient declined - - -  Copy of [place] in Chart? No - copy requested No - copy requested - No - copy requested - - -  Would patient like information on creating a medical advance directive? - - - - No - Patient declined - No - patient declined information  Pre-existing out of facility DNR order (yellow form or pink MOST form) - - - - - - -    Current Medications (verified) Outpatient Encounter Medications as of 01/11/2022  Medication Sig   amLODipine (NORVASC) 5 MG tablet TAKE 1 TABLET BY MOUTH  DAILY   aspirin EC 81 MG tablet Take 81 mg by mouth daily.   B Complex-C (B-COMPLEX WITH VITAMIN C) tablet Take 1 tablet by mouth daily.   Cholecalciferol (VITAMIN D3) 125 MCG (5000 UT) CAPS  Take by mouth daily. Monday, Wednesday, Friday   diclofenac Sodium (VOLTAREN) 1 % GEL Apply 2 g topically 4 (four) times daily.   fluocinonide cream (LIDEX) 0.76 % Apply 1 application topically 2 (two) times daily.   furosemide (LASIX) 40 MG tablet Take 40 mg by mouth daily.   gabapentin (NEURONTIN) 300 MG capsule Take 1 capsule (300 mg total) by mouth 2 (two) times daily.   ketoconazole (NIZORAL) 2 % cream Apply 1 application topically daily.   Multiple Vitamin (MULTIVITAMIN WITH MINERALS) TABS tablet Take 1 tablet by mouth daily.   NON FORMULARY CBD gummies   Omega-3 Fatty Acids (FISH OIL) 1200 MG CAPS Take 1 capsule by mouth daily.    oxyCODONE-acetaminophen (PERCOCET/ROXICET) 5-325 MG tablet Take 1-2 tablets by mouth daily as needed for pain.    Red Yeast Rice Extract (RED YEAST RICE PO) Take 2 capsules by mouth daily.   Saw Palmetto 450 MG CAPS Take 2 capsules by mouth daily.   tamsulosin (FLOMAX) 0.4 MG CAPS capsule Take 0.4 mg by mouth daily.    timolol (BETIMOL) 0.5 % ophthalmic solution Place 1 drop into both eyes daily.    valsartan-hydrochlorothiazide (DIOVAN-HCT) 80-12.5 MG tablet Take 1 tablet by mouth daily.   loratadine (CLARITIN) 10 MG tablet Take 1 tablet (10 mg total) by mouth daily.   No facility-administered encounter medications on file as of 01/11/2022.    Allergies (verified) Cephalexin   History: Past Medical History:  Diagnosis Date  Arthritis    Cataract    Glaucoma    Hypertension    Past Surgical History:  Procedure Laterality Date   APPENDECTOMY     BACK SURGERY     ballon angioplasty  03/18/2012   FOOT SURGERY     JOINT REPLACEMENT     kidney stent Left 2015   KNEE SURGERY Left x 2    left ankle     LEFT HEART CATHETERIZATION WITH CORONARY ANGIOGRAM N/A 03/18/2012   Procedure: LEFT HEART CATHETERIZATION WITH CORONARY ANGIOGRAM;  Surgeon: Laverda Page, MD;  Location: [facility] CATH LAB;  Service: Cardiovascular;  Laterality: N/A;   RENAL ANGIOGRAM  N/A 03/18/2012   Procedure: RENAL ANGIOGRAM;  Surgeon: Laverda Page, MD;  Location: [facility] CATH LAB;  Service: Cardiovascular;  Laterality: N/A;   right wrist     SPINE SURGERY     Family History  Problem Relation Age of Onset   Dementia Mother    Cancer Father    Social History   Socioeconomic History   Marital status: Widowed    Spouse name: Not on file   Number of children: 2   Years of education: 12.5   Highest education level: Not on file  Occupational History   Occupation: retired    Comment: [employer]  Tobacco Use   Smoking status: Former    Packs/day: 0.25    Years: 3.00    Pack years: 0.75    Types: Cigarettes    Quit date: 06/02/1972    Years since quitting: 49.6   Smokeless tobacco: Never  Vaping Use   Vaping Use: Never used  Substance and Sexual Activity   Alcohol use: Not Currently   Drug use: Yes    Types: Oxycodone   Sexual activity: Not Currently  Other Topics Concern   Not on file  Social History Narrative   Lives at home by himself   Caffeine use- none   Social Determinants of Health   Financial Resource Strain: Low Risk    Difficulty of Paying Living Expenses: Not hard at all  Food Insecurity: No Food Insecurity   Worried About Charity fundraiser in the Last Year: Never true   [place] in the Last Year: Never true  Transportation Needs: No Transportation Needs   Lack of Transportation (Medical): No   Lack of Transportation (Non-Medical): No  Physical Activity: Inactive   Days of Exercise per Week: 0 days   Minutes of Exercise per Session: 0 min  Stress: No Stress Concern Present   Feeling of Stress : Not at all  Social Connections: Not on file    Tobacco Counseling Counseling given: Not Answered   Clinical Intake:  Pre-visit preparation completed: Yes  Pain : No/denies pain (foot and ankle hurts with ambulating)     Nutritional Status: BMI > 30  Obese Nutritional Risks: None Diabetes: No  How often do you need to  have someone help you when you read instructions, pamphlets, or other written materials from your doctor or pharmacy?: 1 - Never  Diabetic? no  Interpreter Needed?: No  Information entered by :: NAllen LPN   Activities of Daily Living In your present state of health, do you have any difficulty performing the following activities: 01/11/2022  Hearing? Y  Comment no hearing in left ear  Vision? N  Difficulty concentrating or making decisions? Y  Comment sometimes  Walking or climbing stairs? Y  Dressing or bathing? N  Doing errands, shopping? N  Preparing Food and eating ? N  Using the Toilet? N  In the past six months, have you accidently leaked urine? N  Do you have problems with loss of bowel control? N  Managing your Medications? N  Managing your Finances? N  Housekeeping or managing your Housekeeping? N  Some recent data might be hidden    Patient Care Team: Glendale Chard, MD as PCP - General (Internal Medicine) Warden Fillers, MD as Consulting Physician (Ophthalmology) Mayford Knife, [facility] (Pharmacist) Daneen Schick as [place] any recent Medical Services you may have received from other than [facility] providers in the past year (date may be approximate).     Assessment:   This is a routine wellness examination for [place].  Hearing/Vision screen Vision Screening - Comments:: Regular eye exams, [facility]  Dietary issues and exercise activities discussed: Current Exercise Habits: The patient does not participate in regular exercise at present   Goals Addressed             This Visit's Progress    Patient Stated       01/11/2022, no goals       Depression Screen PHQ 2/9 Scores 01/11/2022 01/04/2021 12/30/2019 03/31/2019 03/09/2019 12/25/2018 12/04/2018  PHQ - 2 Score 1 0 0 0 0 0 0  PHQ- 9 Score - - 3 - - 5 -    Fall Risk Fall Risk  01/11/2022 01/04/2021 12/30/2019 06/30/2019 03/31/2019  Falls in the past year? 1 1  0 1 0  Comment tripped on curb equilibrium is off - - -  Number falls in past yr: 0 1 - 0 -  Comment - - - - -  Injury with Fall? 0 0 - 0 -  Comment - - - - -  Risk for fall due to : Impaired balance/gait;Impaired mobility;Medication side effect Impaired balance/gait;Impaired mobility;Medication side effect Impaired balance/gait;Medication side effect - -  Risk for fall due to: Comment - - - - -  Follow up Falls evaluation completed;Education provided;Falls prevention discussed Falls evaluation completed;Education provided;Falls prevention discussed Falls evaluation completed;Education provided;Falls prevention discussed - -    FALL RISK PREVENTION PERTAINING TO THE HOME:  Any stairs in or around the home? Yes  If so, are there any without handrails? No  Home free of loose throw rugs in walkways, pet beds, electrical cords, etc? Yes  Adequate lighting in your home to reduce risk of falls? Yes   ASSISTIVE DEVICES UTILIZED TO PREVENT FALLS:  Life alert? No  Use of a cane, walker or w/c? Yes  Grab bars in the bathroom? No  Shower chair or bench in shower? Yes  Elevated toilet seat or a handicapped toilet? Yes   TIMED UP AND GO:  Was the test performed? No .    Gait slow and steady with assistive device  Cognitive Function:     6CIT Screen 01/11/2022 01/04/2021 12/30/2019 12/25/2018  What Year? 0 points 0 points 0 points 0 points  What month? 0 points 0 points 0 points 0 points  What time? 0 points 0 points 0 points 0 points  Count back from 20 0 points 0 points 0 points 0 points  Months in reverse 4 points 0 points 0 points 0 points  Repeat phrase 6 points 8 points 4 points 2 points  Total Score 10 8 4 2     Immunizations Immunization History  Administered Date(s) Administered   Fluad Quad(high Dose 65+) 08/07/2019, 08/15/2020, 08/29/2021   Influenza,  High Dose Seasonal PF 09/06/2017   Influenza-Unspecified 08/04/2018   Moderna SARS-COV2 Booster Vaccination 09/30/2020    Moderna Sars-Covid-2 Vaccination 12/27/2019, 01/24/2020   PFIZER(Purple Top)SARS-COV-2 Vaccination 05/29/2021   Pneumococcal Conjugate-13 08/27/2019   Pneumococcal Polysaccharide-23 06/09/2010   Pneumococcal-Unspecified 06/18/2011   Tdap 09/26/2021   Zoster Recombinat (Shingrix) 08/27/2019, 10/26/2019    TDAP status: Up to date  Flu Vaccine status: Up to date  Pneumococcal vaccine status: Up to date  Covid-19 vaccine status: Information provided on how to obtain vaccines.   Qualifies for Shingles Vaccine? Yes   Zostavax completed No   Shingrix Completed?: Yes  Screening Tests Health Maintenance  Topic Date Due   COVID-19 Vaccine (4 - Booster) 07/24/2021   TETANUS/TDAP  09/27/2031   Pneumonia Vaccine 94+ Years old  Completed   INFLUENZA VACCINE  Completed   Zoster Vaccines- Shingrix  Completed   HPV VACCINES  Aged Out    Health Maintenance  Health Maintenance Due  Topic Date Due   COVID-19 Vaccine (4 - Booster) 07/24/2021    Colorectal cancer screening: No longer required.   Lung Cancer Screening: (Low Dose CT Chest recommended if Age 57-80 years, 30 pack-year currently smoking OR have quit w/in 15years.) does not qualify.   Lung Cancer Screening Referral: no  Additional Screening:  Hepatitis C Screening: does not qualify;   Vision Screening: Recommended annual ophthalmology exams for early detection of glaucoma and other disorders of the eye. Is the patient up to date with their annual eye exam?  Yes  Who is the provider or what is the name of the office in which the patient attends annual eye exams? [facility] If pt is not established with a provider, would they like to be referred to a provider to establish care? No .   Dental Screening: Recommended annual dental exams for proper oral hygiene  Community Resource Referral / Chronic Care Management: CRR required this visit?  No   CCM required this visit?  No      Plan:     I have personally  reviewed and noted the following in the patients chart:   Medical and social history Use of alcohol, tobacco or illicit drugs  Current medications and supplements including opioid prescriptions. Patient is currently taking opioid prescriptions. Information provided to patient regarding non-opioid alternatives. Patient advised to discuss non-opioid treatment plan with their provider. Functional ability and status Nutritional status Physical activity Advanced directives List of other physicians Hospitalizations, surgeries, and ER visits in previous 12 months Vitals Screenings to include cognitive, depression, and falls Referrals and appointments  In addition, I have reviewed and discussed with patient certain preventive protocols, quality metrics, and best practice recommendations. A written personalized care plan for preventive services as well as general preventive health recommendations were provided to patient.     Kellie Simmering, LPN   04/07/3874   Nurse Notes: none

## 2022-01-12 ENCOUNTER — Other Ambulatory Visit: Payer: Self-pay

## 2022-01-12 LAB — LIPID PANEL
Chol/HDL Ratio: 3.6 ratio (ref 0.0–5.0)
Cholesterol, Total: 173 mg/dL (ref 100–199)
HDL: 48 mg/dL (ref >39)
LDL Chol Calc (NIH): 113 mg/dL — ABNORMAL HIGH (ref 0–99)
Triglycerides: 59 mg/dL (ref 0–149)
VLDL Cholesterol Cal: 12 mg/dL (ref 5–40)

## 2022-01-12 MED ORDER — ATORVASTATIN CALCIUM 20 MG PO TABS: ORAL_TABLET | ORAL | 11 refills | Status: DC

## 2022-01-16 ENCOUNTER — Ambulatory Visit (INDEPENDENT_AMBULATORY_CARE_PROVIDER_SITE_OTHER): Payer: Medicare Other

## 2022-01-16 DIAGNOSIS — I2510 Atherosclerotic heart disease of native coronary artery without angina pectoris: Secondary | ICD-10-CM

## 2022-01-16 DIAGNOSIS — I129 Hypertensive chronic kidney disease with stage 1 through stage 4 chronic kidney disease, or unspecified chronic kidney disease: Secondary | ICD-10-CM

## 2022-01-16 DIAGNOSIS — N184 Chronic kidney disease, stage 4 (severe): Secondary | ICD-10-CM

## 2022-01-16 NOTE — Progress Notes (Signed)
Chronic Care Management Pharmacy Note  01/30/2022 Name:  Wayne [name] MRN:  [id number] DOB:  1925/12/10  Summary: Patient would like to talk to someone about helping with housework one day a week.   Recommendations/Changes made from today's visit: Recommend patient receive COVID-19 booster vaccine. Recommend patient talk with social worker about possibility of someone helping with housework.  Patient to stop taking Senna and start taking docusate.   Plan: Patient reports that he is going to receive the booster shot. Patient reports that he is willing to speak to the social worker about resources for once weekly help with chores around the house. Patient is going to [employer] and purchasing docusate.    Subjective: Wayne [name] is an 88 y.o. year old male who is a primary patient of Glendale Chard, MD.  The CCM team was consulted for assistance with disease management and care coordination needs.    Engaged with patient by telephone for follow up visit in response to provider referral for pharmacy case management and/or care coordination services. He lost his wife in 1987  Consent to Services:  The patient was given information about Chronic Care Management services, agreed to services, and gave verbal consent prior to initiation of services.  Please see initial visit note for detailed documentation.   Patient Care Team: Glendale Chard, MD as PCP - General (Internal Medicine) Warden Fillers, MD as Consulting Physician (Ophthalmology) Mayford Knife, [facility] (Pharmacist) Daneen Schick as [facility]  Recent office visits: 01/11/2022 PCP OV  10/31/2021 PCP OV   Recent consult visits: 10/23/2021  09/04/2021 [facility] visits: None in previous 6 months   Objective:  Lab Results  Component Value Date   CREATININE 1.50 (H) 10/04/2021   BUN 26 10/04/2021   EGFR 43 (L) 10/04/2021   GFRNONAA 47 (L) 01/23/2021   GFRAA 55 (L)  01/23/2021   NA 142 10/04/2021   K 3.9 10/04/2021   CALCIUM 9.4 10/04/2021   CO2 25 10/04/2021   GLUCOSE 106 (H) 10/04/2021    Lab Results  Component Value Date/Time   HGBA1C 5.6 12/30/2019 03:38 PM   HGBA1C 5.5 12/29/2018 12:26 PM    Last diabetic Eye exam: No results found for: HMDIABEYEEXA  Last diabetic Foot exam: No results found for: HMDIABFOOTEX   Lab Results  Component Value Date   CHOL 173 01/11/2022   HDL 48 01/11/2022   LDLCALC 113 (H) 01/11/2022   TRIG 59 01/11/2022   CHOLHDL 3.6 01/11/2022    Hepatic Function Latest Ref Rng & Units 10/04/2021 05/30/2021 12/30/2019  Total Protein 6.0 - 8.5 g/dL 6.8 6.7 7.2  Albumin 3.5 - 4.6 g/dL 4.2 4.1 4.2  AST 0 - 40 IU/L _0 ALT 0 - 44 IU/L _1 Alk Phosphatase 44 - 121 IU/L 97 93 108  Total Bilirubin 0.0 - 1.2 mg/dL 0.5 0.4 0.2    Lab Results  Component Value Date/Time   TSH 1.940 01/23/2021 04:36 PM   TSH 0.999 11/22/2015 06:27 PM   TSH 0.928 03/31/2014 04:43 PM    CBC Latest Ref Rng & Units 10/04/2021 05/30/2021 12/30/2019  WBC 3.4 - 10.8 x10E3/uL 4.7 4.6 3.9  Hemoglobin 13.0 - 17.7 g/dL 13.1 12.6(L) 13.4  Hematocrit 37.5 - 51.0 % 37.9 36.6(L) 39.9  Platelets 150 - 450 x10E3/uL 203 199 251    Lab Results  Component Value Date/Time   VD25OH 37.5 06/30/2019 10:54 AM    Clinical  ASCVD: Yes  The ASCVD Risk score (Arnett DK, et al., 2019) failed to calculate for the following reasons:   The 2019 ASCVD risk score is only valid for ages 62 to 25    Depression screen PHQ 2/9 01/11/2022 01/04/2021 12/30/2019  Decreased Interest 0 0 0  Down, Depressed, Hopeless 1 0 0  PHQ - 2 Score 1 0 0  Altered sleeping - - 3  Tired, decreased energy - - 0  Change in appetite - - 0  Feeling bad or failure about yourself  - - 0  Trouble concentrating - - 0  Moving slowly or fidgety/restless - - 0  Suicidal thoughts - - 0  PHQ-9 Score - - 3  Difficult doing work/chores - - Not difficult at all     Social History    Tobacco Use  Smoking Status Former   Packs/day: 0.25   Years: 3.00   Pack years: 0.75   Types: Cigarettes   Quit date: 06/02/1972   Years since quitting: 49.6  Smokeless Tobacco Never   BP Readings from Last 3 Encounters:  01/11/22 124/68  01/11/22 124/68  10/31/21 134/80   Pulse Readings from Last 3 Encounters:  01/11/22 65  01/11/22 65  10/31/21 76   Wt Readings from Last 3 Encounters:  01/11/22 164 lb 7.4 oz (74.6 kg)  01/11/22 164 lb 6.4 oz (74.6 kg)  10/31/21 166 lb 9.6 oz (75.6 kg)   BMI Readings from Last 3 Encounters:  01/11/22 34.85 kg/m  01/11/22 34.84 kg/m  10/31/21 29.51 kg/m    Assessment/Interventions: Review of patient past medical history, allergies, medications, health status, including review of consultants reports, laboratory and other test data, was performed as part of comprehensive evaluation and provision of chronic care management services.   SDOH:  (Social Determinants of Health) assessments and interventions performed: Yes  SDOH Screenings   Alcohol Screen: Not on file  Depression (PHQ2-9): Low Risk    PHQ-2 Score: 1  Financial Resource Strain: Low Risk    Difficulty of Paying Living Expenses: Not hard at all  Food Insecurity: No Food Insecurity   Worried About Charity fundraiser in the Last Year: Never true   Ran Out of Food in the Last Year: Never true  Housing: Low Risk    Last Housing Risk Score: 0  Physical Activity: Inactive   Days of Exercise per Week: 0 days   Minutes of Exercise per Session: 0 min  Social Connections: Not on file  Stress: No Stress Concern Present   Feeling of Stress : Not at all  Tobacco Use: Medium Risk   Smoking Tobacco Use: Former   Smokeless Tobacco Use: Never   Passive Exposure: Not on file  Transportation Needs: No Transportation Needs   Lack of Transportation (Medical): No   Lack of Transportation (Non-Medical): No    CCM Care Plan  Allergies  Allergen Reactions   Cephalexin     Severe  mouth and throat dryness.    Medications Reviewed Today     Reviewed by Mayford Knife, RPH (Pharmacist) on 01/16/22 at [place] Status: <None>   Medication Order Taking? Sig Documenting Provider Last Dose Status Informant  amLODipine (NORVASC) 5 MG tablet [id number] Yes TAKE 1 TABLET BY MOUTH  DAILY Glendale Chard, MD  Active   aspirin EC 81 MG tablet [id number] Yes Take 81 mg by mouth daily. [provider]  Active Self  atorvastatin (LIPITOR) 20 MG tablet [id number] Yes TAKE M,W,F ONLY.  Glendale Chard, MD  Active   B Complex-C (B-COMPLEX WITH VITAMIN C) tablet [id number] Yes Take 1 tablet by mouth daily. [provider]  Active   Cholecalciferol (VITAMIN D3) 125 MCG (5000 UT) CAPS [id number] Yes Take by mouth daily. Monday, Wednesday, Friday [provider]  Active   diclofenac Sodium (VOLTAREN) 1 % GEL [id number] Yes Apply 2 g topically 4 (four) times daily. Glendale Chard, MD  Active   fluocinonide cream (LIDEX) 0.05 % [id number] Yes Apply 1 application topically 2 (two) times daily. Glendale Chard, MD  Active   furosemide (LASIX) 40 MG tablet [id number] Yes Take 40 mg by mouth daily. [provider]  Active   gabapentin (NEURONTIN) 300 MG capsule [id number] Yes Take 1 capsule (300 mg total) by mouth 2 (two) times daily. Glendale Chard, MD  Active   ketoconazole (NIZORAL) 2 % cream [id number] Yes Apply 1 application topically daily. Glendale Chard, MD  Active   loratadine (CLARITIN) 10 MG tablet [id number] Yes Take 1 tablet (10 mg total) by mouth daily. Glendale Chard, MD  Active   Multiple Vitamin (MULTIVITAMIN WITH MINERALS) TABS tablet [id number] Yes Take 1 tablet by mouth daily. [provider]  Active Self  Baruch Gouty [id number] Yes CBD gummies [provider]  Active   Omega-3 Fatty Acids (FISH OIL) 1200 MG CAPS [id number] Yes Take 1 capsule by mouth daily.  [provider]  Active Self  oxyCODONE-acetaminophen  (PERCOCET/ROXICET) 5-325 MG tablet [id number] Yes Take 1-2 tablets by mouth daily as needed for pain.  [provider]  Active Self   Discontinued 01/16/22 1507 (Patient Preference)            Med Note>> Mayford Knife, [facility]   01/16/2022  3:07 PM patient is no longer taking this medication     Saw Palmetto 450 MG CAPS [id number] Yes Take 2 capsules by mouth daily. [provider]  Active   tamsulosin (FLOMAX) 0.4 MG CAPS capsule [id number] Yes Take 0.4 mg by mouth daily.  [provider]  Active   timolol (BETIMOL) 0.5 % ophthalmic solution [id number] Yes Place 1 drop into both eyes daily.  [provider]  Active Self  valsartan-hydrochlorothiazide (DIOVAN-HCT) 80-12.5 MG tablet [id number] Yes Take 1 tablet by mouth daily. Bary Castilla, NP  Active             Patient Active Problem List   Diagnosis Date Noted   Generalized osteoarthritis of multiple sites 07/24/2021   Hypertensive heart and renal disease 07/24/2021   Overweight with body mass index (BMI) of 27 to 27.9 in adult 07/24/2021   Other chronic pain 07/24/2021   Renal artery atherosclerosis ([place]) 12/30/2019   Viral upper respiratory tract infection 03/09/2019   Hearing loss due to cerumen impaction, bilateral 12/04/2018   Acute back pain 06/10/2018   Spondylosis of lumbar region without myelopathy or radiculopathy 06/10/2018   Carpal tunnel syndrome of left wrist 12/12/2017   Hammertoe 07/01/2016   Anemia of chronic renal failure, stage 3 (moderate) ([place]) 11/23/2015   Dehydration 11/22/2015   CKD (chronic kidney disease) stage 3, GFR 30-59 ml/min (HCC) 11/22/2015   Sepsis ([place]) 11/22/2015   Diarrhea 11/22/2015   Leukopenia 11/22/2015   Benign essential HTN 11/22/2015   Coronary artery disease involving native coronary artery without angina pectoris 03/18/2012   Hyperlipidemia 03/18/2012    Immunization History  Administered Date(s) Administered   Fluad Quad(high Dose 65+)  08/07/2019, 08/15/2020, 08/29/2021   Influenza, High Dose Seasonal PF 09/06/2017  Influenza-Unspecified 08/04/2018   Moderna SARS-COV2 Booster Vaccination 09/30/2020   Moderna Sars-Covid-2 Vaccination 12/27/2019, 01/24/2020   PFIZER(Purple Top)SARS-COV-2 Vaccination 05/29/2021   Pneumococcal Conjugate-13 08/27/2019   Pneumococcal Polysaccharide-23 06/09/2010   Pneumococcal-Unspecified 06/18/2011   Tdap 09/26/2021   Zoster Recombinat (Shingrix) 08/27/2019, 10/26/2019    Conditions to be addressed/monitored:  Hypertension and Hyperlipidemia  Care Plan : Pharmacy Care Plan  Updates made by Mayford Knife, RPH since 01/30/2022 12:00 AM     Problem: HTN, CAD, Query Constipation   Priority: High     Long-Range Goal: Disease Management   Recent Progress: On track  Priority: High  Note:   Current Barriers:  Unable to independently monitor therapeutic efficacy  Pharmacist Clinical Goal(s):  Patient will achieve adherence to monitoring guidelines and medication adherence to achieve therapeutic efficacy through collaboration with PharmD and provider.   Interventions: 1:1 collaboration with Glendale Chard, MD regarding development and update of comprehensive plan of care as evidenced by provider attestation and co-signature Inter-disciplinary care team collaboration (see longitudinal plan of care) Comprehensive medication review performed; medication list updated in electronic medical record  Hypertension (BP goal <130/80) -Controlled -Current treatment: Valsartan-Hydrochlorothiazide 80-12.5 mg tablet once per day Appropriate, Effective, Safe, Accessible Amlodipine 5 mg tablet once per day Appropriate, Effective, Safe, Accessible -Medications previously tried: None noted   -Current home readings: He is not currently checking his BP at home.  -Current dietary habits: patient reports that he is eating a full meal, he is cooking -Current exercise habits: He does odds and ends at  the house.  -Denies hypotensive/hypertensive symptoms -Educated on Daily salt intake goal < 2300 mg; -Counseled to monitor BP at home at least once per week, document, and provide log at future appointments -Recommended to continue current medication  Coronary Artery Disease: (LDL goal < 70) -Controlled -Current treatment: Atorvastatin 20 mg tablet three times per week on Monday, Wednesday and Friday. Appropriate, Effective, Safe, Accessible Patient reports that he is not having any issues with his medication  -Current dietary patterns: he is eating  -Current exercise habits: he uses dumb bells and bungee cords to exercise, he is still using his cane when he is walking. He is doing the exercise three days per week.  -Educated on Cholesterol goals;  Importance of limiting foods high in cholesterol; -Recommended to continue current medication  Query Constipation (Goal: reduce hard stools ) -Not ideally controlled -Current treatment  Senna 8.6 mg - taking 1 tablet by mouth daily Appropriate, Effective, Query Safe -Patient reports that he started taking this medication because Dr. Benson Norway suggested a stool softener he picked up the equate brand of stool softener which is senna.  -We discussed that senna is considered a laxative  -Called Dr. Minerva Areola office and reached out to RN on the team for possible next steps.  -Collaborate with PCP team and GI doctor to discontinue patients use of Senna and changing patients regimen. -Recommended patient use stool softener colace, and ask the pharmacist at [facility] for helping finding the generic version.   Patient Goals/Self-Care Activities Patient will:  - take medications as prescribed as evidenced by patient report and record review  Follow Up Plan: The patient has been provided with contact information for the care management team and has been advised to call with any health related questions or concerns.       Medication Assistance: None required.   Patient affirms current coverage meets needs.  Compliance/Adherence/Medication fill history: Care Gaps: COVID-19 Booster  Star-Rating Drugs: Atorvastatin 20 mg  tablet  Valsartan-HCTZ 80-12.5 mg tablet   Patient's preferred pharmacy is:  [employer] ([place], [place] [address] [place] [address] Phone: [phone] Fax: [phone]  [facility] [id number] - [place], [place] - [address] [place] [place] [address] Phone: [phone] Fax: [phone]  Uses pill box? Yes Pt endorses 95% compliance  We discussed: Benefits of medication synchronization, packaging and delivery as well as enhanced pharmacist oversight with [employer]. Patient decided to: Continue current medication management strategy  Care Plan and Follow Up Patient Decision:  Patient agrees to Care Plan and Follow-up.  Plan: The patient has been provided with contact information for the care management team and has been advised to call with any health related questions or concerns.   Orlando Penner, CPP, PharmD Clinical Pharmacist Practitioner [facility] [phone]

## 2022-01-26 DIAGNOSIS — M1711 Unilateral primary osteoarthritis, right knee: Secondary | ICD-10-CM | POA: Diagnosis not present

## 2022-01-30 ENCOUNTER — Ambulatory Visit: Payer: Medicare Other

## 2022-01-30 DIAGNOSIS — N184 Chronic kidney disease, stage 4 (severe): Secondary | ICD-10-CM

## 2022-01-30 DIAGNOSIS — I701 Atherosclerosis of renal artery: Secondary | ICD-10-CM

## 2022-01-30 DIAGNOSIS — N183 Chronic kidney disease, stage 3 unspecified: Secondary | ICD-10-CM

## 2022-01-30 DIAGNOSIS — E785 Hyperlipidemia, unspecified: Secondary | ICD-10-CM

## 2022-01-30 DIAGNOSIS — I1310 Hypertensive heart and chronic kidney disease without heart failure, with stage 1 through stage 4 chronic kidney disease, or unspecified chronic kidney disease: Secondary | ICD-10-CM

## 2022-01-30 DIAGNOSIS — I129 Hypertensive chronic kidney disease with stage 1 through stage 4 chronic kidney disease, or unspecified chronic kidney disease: Secondary | ICD-10-CM

## 2022-01-30 DIAGNOSIS — I2510 Atherosclerotic heart disease of native coronary artery without angina pectoris: Secondary | ICD-10-CM

## 2022-01-30 NOTE — Chronic Care Management (AMB) (Signed)
Chronic Care Management    Social Work Note  01/30/2022 Name: Wayne [name] MRN: [id number] DOB: [birth year]  Wayne [name] is a 88 y.o. year old male who is a primary care patient of Glendale Chard, MD. The CCM team was consulted to assist the patient with chronic disease management and/or care coordination needs related to:  HTN, HLD, Atherosclerosis of Renal Artery, Hypertensive Heart and Renal Disease .   Engaged with patient by telephone for initial visit in response to provider referral for social work chronic care management and care coordination services.   Consent to Services:  The patient was given the following information about Chronic Care Management services today, agreed to services, and gave verbal consent: 1. CCM service includes personalized support from designated clinical staff supervised by the primary care provider, including individualized plan of care and coordination with other care providers 2. 24/7 contact phone numbers for assistance for urgent and routine care needs. 3. Service will only be billed when office clinical staff spend 20 minutes or more in a month to coordinate care. 4. Only one practitioner may furnish and bill the service in a calendar month. 5.The patient may stop CCM services at any time (effective at the end of the month) by phone call to the office staff. 6. The patient will be responsible for cost sharing (co-pay) of up to 20% of the service fee (after annual deductible is met). Patient agreed to services and consent obtained.  Patient agreed to services and consent obtained.   Assessment: Review of patient past medical history, allergies, medications, and health status, including review of relevant consultants reports was performed today as part of a comprehensive evaluation and provision of chronic care management and care coordination services.     SDOH (Social Determinants of Health) assessments and interventions performed:  SDOH  Interventions    Flowsheet Row Most Recent Value  SDOH Interventions   Food Insecurity Interventions Intervention Not Indicated  Housing Interventions Intervention Not Indicated  Transportation Interventions Intervention Not Indicated        Advanced Directives Status: Not addressed in this encounter.  CCM Care Plan  Allergies  Allergen Reactions   Cephalexin     Severe mouth and throat dryness.    Outpatient Encounter Medications as of 01/30/2022  Medication Sig   amLODipine (NORVASC) 5 MG tablet TAKE 1 TABLET BY MOUTH  DAILY   aspirin EC 81 MG tablet Take 81 mg by mouth daily.   atorvastatin (LIPITOR) 20 MG tablet TAKE M,W,F ONLY.   B Complex-C (B-COMPLEX WITH VITAMIN C) tablet Take 1 tablet by mouth daily.   Cholecalciferol (VITAMIN D3) 125 MCG (5000 UT) CAPS Take by mouth daily. Monday, Wednesday, Friday   diclofenac Sodium (VOLTAREN) 1 % GEL Apply 2 g topically 4 (four) times daily.   fluocinonide cream (LIDEX) 5.10 % Apply 1 application topically 2 (two) times daily.   furosemide (LASIX) 40 MG tablet Take 40 mg by mouth daily.   gabapentin (NEURONTIN) 300 MG capsule Take 1 capsule (300 mg total) by mouth 2 (two) times daily.   ketoconazole (NIZORAL) 2 % cream Apply 1 application topically daily.   loratadine (CLARITIN) 10 MG tablet Take 1 tablet (10 mg total) by mouth daily.   Multiple Vitamin (MULTIVITAMIN WITH MINERALS) TABS tablet Take 1 tablet by mouth daily.   NON FORMULARY CBD gummies   Omega-3 Fatty Acids (FISH OIL) 1200 MG CAPS Take 1 capsule by mouth daily.    oxyCODONE-acetaminophen (PERCOCET/ROXICET) 5-325 MG tablet Take  1-2 tablets by mouth daily as needed for pain.    Saw Palmetto 450 MG CAPS Take 2 capsules by mouth daily.   senna (SENOKOT) 8.6 MG tablet Take 1 tablet by mouth daily.   tamsulosin (FLOMAX) 0.4 MG CAPS capsule Take 0.4 mg by mouth daily.    timolol (BETIMOL) 0.5 % ophthalmic solution Place 1 drop into both eyes daily.     valsartan-hydrochlorothiazide (DIOVAN-HCT) 80-12.5 MG tablet Take 1 tablet by mouth daily.   No facility-administered encounter medications on file as of 01/30/2022.    Patient Active Problem List   Diagnosis Date Noted   Generalized osteoarthritis of multiple sites 07/24/2021   Hypertensive heart and renal disease 07/24/2021   Overweight with body mass index (BMI) of 27 to 27.9 in adult 07/24/2021   Other chronic pain 07/24/2021   Renal artery atherosclerosis ([place]) 12/30/2019   Viral upper respiratory tract infection 03/09/2019   Hearing loss due to cerumen impaction, bilateral 12/04/2018   Acute back pain 06/10/2018   Spondylosis of lumbar region without myelopathy or radiculopathy 06/10/2018   Carpal tunnel syndrome of left wrist 12/12/2017   Hammertoe 07/01/2016   Anemia of chronic renal failure, stage 3 (moderate) ([place]) 11/23/2015   Dehydration 11/22/2015   CKD (chronic kidney disease) stage 3, GFR 30-59 ml/min (HCC) 11/22/2015   Sepsis ([place]) 11/22/2015   Diarrhea 11/22/2015   Leukopenia 11/22/2015   Benign essential HTN 11/22/2015   Coronary artery disease involving native coronary artery without angina pectoris 03/18/2012   Hyperlipidemia 03/18/2012    Conditions to be addressed/monitored:  HTN, HLD, Atherosclerosis of Renal Artery, and Hypertensive Heart and Renal Disease ; ADL IADL limitations  Care Plan : Social Work Plan of Care  Updates made by Daneen Schick since 01/30/2022 12:00 AM     Problem: Mobility and Independence      Goal: Mobility and Independence Optimized   Start Date: 01/30/2022  Priority: High  Note:   Current Barriers:  Chronic disease management support and education needs related to  HTN, HLD, Atherosclerosis of Renal Artery, Hypertensive Heart and Renal Diease   ADL IADL limitations with limited knowledge of community resources to assist   Social Worker Clinical Goal(s):  patient will work with SW to identify and address any acute and/or  chronic care coordination needs related to the self health management of  HTN, HLD, Atherosclerosis of Renal Artery, Hypertensive Heart and Renal Disease   Explore community resource options for unmet needs related to assistance with housekeeping SW Interventions:  Inter-disciplinary care team collaboration (see longitudinal plan of care) Collaboration with Glendale Chard, MD regarding development and update of comprehensive plan of care as evidenced by provider attestation and co-signature Telephonic visit completed with the patient in response to a new referral to assist patient with level of care needs SDoH assessment performed - no challenges noted at this time Discussed the patients arthritis is continuing to worsen and he would like assistance with housekeeping and linen changes Performed chart review to note patients health plan coverage does not currently cover custodial care in the home Educated the patient on the opportunity to apply for the in home aid program or hire someone out of pocket Discussed this is a long wait list that will take up to 1 year or longer -patient requests SW place him on this wait list Advised the patient he will receive mailings periodically asking if he is still interested in this program, reviewed the importance of responding to these mailings in order to  remain on the wait list Unsuccessful outbound call placed to the [facility] in home aid program, voice message left requesting a return call Discussed plan for SW to place patient on the wait list while patient remains actively involved with  Pharmacist  to address care management needs  Patient Goals/Self-Care Activities patient will:   -  Engage with the in home aid program as needed -Contact SW as needed  Follow Up Plan:  SW will follow up with the in home aid program over the next 3 days if a return call is not received       Follow Up Plan:  SW will follow up with the in home aid program over  the next 3 days to place patient on the wait list.      Daneen Schick, BSW, CDP Social Worker, Certified Dementia Practitioner [place] [facility] [place] [facility] [phone]

## 2022-01-30 NOTE — Patient Instructions (Signed)
Social Worker Visit Information  Goals we discussed today:  Patient Goals/Self-Care Activities patient will:   - Engage with the in home aid program as needed -Contact SW as needed  Materials Provided: Verbal education about community resources provided by phone  The patient verbalized understanding of instructions, educational materials, and care plan provided today and declined offer to receive copy of patient instructions, Scientist, clinical (histocompatibility and immunogenetics), and care plan.   Follow Up Plan:  SW will follow up with the in home aid program over the next 3 days   Daneen Schick, BSW, CDP Social Worker, Certified Dementia Practitioner [place] [facility] [place] [facility] [phone]

## 2022-01-30 NOTE — Patient Instructions (Addendum)
Visit Information It was great speaking with you today!  Please let me know if you have any questions about our visit.   Goals Addressed             This Visit's Progress    Manage My Medicine       Timeframe:  Long-Range Goal Priority:  High                            Expected End Date:                       Follow Up Date: 07/17/2022 In Progress:    - call for medicine refill 2 or 3 days before it runs out - call if I am sick and can't take my medicine - use a pillbox to sort medicine - use an alarm clock or phone to remind me to take my medicine    Why is this important?   These steps will help you keep on track with your medicines.          Patient Care Plan: Pharmacy Care Plan     Problem Identified: HTN, CAD, Constipation   Priority: High     Long-Range Goal: Disease Management   Recent Progress: On track  Priority: High  Note:   Current Barriers:  Unable to independently monitor therapeutic efficacy  Pharmacist Clinical Goal(s):  Patient will achieve adherence to monitoring guidelines and medication adherence to achieve therapeutic efficacy through collaboration with PharmD and provider.   Interventions: 1:1 collaboration with Glendale Chard, MD regarding development and update of comprehensive plan of care as evidenced by provider attestation and co-signature Inter-disciplinary care team collaboration (see longitudinal plan of care) Comprehensive medication review performed; medication list updated in electronic medical record  Hypertension (BP goal <130/80) -Controlled -Current treatment: Valsartan-Hydrochlorothiazide 80-12.5 mg tablet once per day Appropriate, Effective, Safe, Accessible Amlodipine 5 mg tablet once per day Appropriate, Effective, Safe, Accessible -Medications previously tried: None noted   -Current home readings: He is not currently checking his BP at home.  -Current dietary habits: patient reports that he is eating a full meal,  he is cooking -Current exercise habits: He does odds and ends at the house.  -Denies hypotensive/hypertensive symptoms -Educated on Daily salt intake goal < 2300 mg; -Counseled to monitor BP at home at least once per week, document, and provide log at future appointments -Recommended to continue current medication  Coronary Artery Disease: (LDL goal < 70) -Controlled -Current treatment: Atorvastatin 20 mg tablet three times per week on Monday, Wednesday and Friday. Appropriate, Effective, Safe, Accessible Patient reports that he is not having any issues with his medication  -Current dietary patterns: he is eating  -Current exercise habits: he uses dumb bells and bungee cords to exercise, he is still using his cane when he is walking. He is doing the exercise three days per week.  -Educated on Cholesterol goals;  Importance of limiting foods high in cholesterol; -Recommended to continue current medication  Query Constipation (Goal: reduce hard stools ) -Not ideally controlled -Current treatment  Senna 8.6 mg - taking 1 tablet by mouth daily Appropriate, Effective, Query Safe -Patient reports that he started taking this medication because Dr. Benson Norway suggested a stool softener he picked up the equate brand of stool softener which is senna.  -We discussed that senna is considered a laxative  -Called Dr. Minerva Areola office and reached out to RN on the  team for possible next steps.  -Collaborate with PCP team and GI doctor to discontinue patients use of Senna and changing patients regimen. -Recommended patient use stool softener colace, and ask the pharmacist at [facility] for helping finding the generic version.   Patient Goals/Self-Care Activities Patient will:  - take medications as prescribed as evidenced by patient report and record review  Follow Up Plan: The patient has been provided with contact information for the care management team and has been advised to call with any health related  questions or concerns.       Patient agreed to services and verbal consent obtained.   The patient verbalized understanding of instructions, educational materials, and care plan provided today and agreed to receive a mailed copy of patient instructions, educational materials, and care plan.   Orlando Penner, PharmD Clinical Pharmacist [facility] [phone]

## 2022-02-02 ENCOUNTER — Ambulatory Visit: Payer: Self-pay

## 2022-02-02 DIAGNOSIS — I701 Atherosclerosis of renal artery: Secondary | ICD-10-CM

## 2022-02-02 DIAGNOSIS — E785 Hyperlipidemia, unspecified: Secondary | ICD-10-CM

## 2022-02-02 DIAGNOSIS — I129 Hypertensive chronic kidney disease with stage 1 through stage 4 chronic kidney disease, or unspecified chronic kidney disease: Secondary | ICD-10-CM

## 2022-02-02 NOTE — Chronic Care Management (AMB) (Signed)
?Chronic Care Management  ? ? Social Work Note ? ?02/02/2022 ?Name: Wayne [name] MRN: [id number] DOB: July 14, 1926 ? ?Wayne [name] is a 88 y.o. year old male who is a primary care patient of Glendale Chard, MD. The CCM team was consulted to assist the patient with chronic disease management and/or care coordination needs related to:  HTN, HLD, Atherosclerosis of Renal Artery .  ? ?Collaboration with community resource  for  case collaboration  in response to provider referral for social work chronic care management and care coordination services.  ? ?Consent to Services:  ?The patient was given information about Chronic Care Management services, agreed to services, and gave verbal consent prior to initiation of services.  Please see initial visit note for detailed documentation.  ? ?Patient agreed to services and consent obtained.  ? ?Assessment: Review of patient past medical history, allergies, medications, and health status, including review of relevant consultants reports was performed today as part of a comprehensive evaluation and provision of chronic care management and care coordination services.    ? ?SDOH (Social Determinants of Health) assessments and interventions performed:   ? ?Advanced Directives Status: Not addressed in this encounter. ? ?CCM Care Plan ? ?Allergies  ?Allergen Reactions  ? Cephalexin   ?  Severe mouth and throat dryness.  ? ? ?Outpatient Encounter Medications as of 02/02/2022  ?Medication Sig  ? amLODipine (NORVASC) 5 MG tablet TAKE 1 TABLET BY MOUTH  DAILY  ? aspirin EC 81 MG tablet Take 81 mg by mouth daily.  ? atorvastatin (LIPITOR) 20 MG tablet TAKE M,W,F ONLY.  ? B Complex-C (B-COMPLEX WITH VITAMIN C) tablet Take 1 tablet by mouth daily.  ? Cholecalciferol (VITAMIN D3) 125 MCG (5000 UT) CAPS Take by mouth daily. Monday, Wednesday, Friday  ? diclofenac Sodium (VOLTAREN) 1 % GEL Apply 2 g topically 4 (four) times daily.  ? fluocinonide cream (LIDEX) 4.31 % Apply 1 application  topically 2 (two) times daily.  ? furosemide (LASIX) 40 MG tablet Take 40 mg by mouth daily.  ? gabapentin (NEURONTIN) 300 MG capsule Take 1 capsule (300 mg total) by mouth 2 (two) times daily.  ? ketoconazole (NIZORAL) 2 % cream Apply 1 application topically daily.  ? loratadine (CLARITIN) 10 MG tablet Take 1 tablet (10 mg total) by mouth daily.  ? Multiple Vitamin (MULTIVITAMIN WITH MINERALS) TABS tablet Take 1 tablet by mouth daily.  ? NON FORMULARY CBD gummies  ? Omega-3 Fatty Acids (FISH OIL) 1200 MG CAPS Take 1 capsule by mouth daily.   ? oxyCODONE-acetaminophen (PERCOCET/ROXICET) 5-325 MG tablet Take 1-2 tablets by mouth daily as needed for pain.   ? Saw Palmetto 450 MG CAPS Take 2 capsules by mouth daily.  ? senna (SENOKOT) 8.6 MG tablet Take 1 tablet by mouth daily.  ? tamsulosin (FLOMAX) 0.4 MG CAPS capsule Take 0.4 mg by mouth daily.   ? timolol (BETIMOL) 0.5 % ophthalmic solution Place 1 drop into both eyes daily.   ? valsartan-hydrochlorothiazide (DIOVAN-HCT) 80-12.5 MG tablet Take 1 tablet by mouth daily.  ? ?No facility-administered encounter medications on file as of 02/02/2022.  ? ? ?Patient Active Problem List  ? Diagnosis Date Noted  ? Generalized osteoarthritis of multiple sites 07/24/2021  ? Hypertensive heart and renal disease 07/24/2021  ? Overweight with body mass index (BMI) of 27 to 27.9 in adult 07/24/2021  ? Other chronic pain 07/24/2021  ? Renal artery atherosclerosis ([place]) 12/30/2019  ? Viral upper respiratory tract infection 03/09/2019  ?  Hearing loss due to cerumen impaction, bilateral 12/04/2018  ? Acute back pain 06/10/2018  ? Spondylosis of lumbar region without myelopathy or radiculopathy 06/10/2018  ? Carpal tunnel syndrome of left wrist 12/12/2017  ? Hammertoe 07/01/2016  ? Anemia of chronic renal failure, stage 3 (moderate) ([place]) 11/23/2015  ? Dehydration 11/22/2015  ? CKD (chronic kidney disease) stage 3, GFR 30-59 ml/min (HCC) 11/22/2015  ? Sepsis ([place]) 11/22/2015  ? Diarrhea  11/22/2015  ? Leukopenia 11/22/2015  ? Benign essential HTN 11/22/2015  ? Coronary artery disease involving native coronary artery without angina pectoris 03/18/2012  ? Hyperlipidemia 03/18/2012  ? ? ?Conditions to be addressed/monitored: HTN, HLD, and Atherosclerosis of Renal Artery ; ADL IADL limitations ? ?Care Plan : Social Work Plan of Care  ?Updates made by Daneen Schick since 02/02/2022 12:00 AM  ?Completed 02/02/2022  ? ?Problem: Mobility and Independence Resolved 02/02/2022  ?  ? ?Goal: Mobility and Independence Optimized Completed 02/02/2022  ?Start Date: 01/30/2022  ?Priority: High  ?Note:   ?Current Barriers:  ?Chronic disease management support and education needs related to  HTN, HLD, Atherosclerosis of Renal Artery, Hypertensive Heart and Renal Diease   ?ADL IADL limitations with limited knowledge of community resources to assist  ? ?Social Worker Clinical Goal(s):  ?patient will work with SW to identify and address any acute and/or chronic care coordination needs related to the self health management of  HTN, HLD, Atherosclerosis of Renal Artery, Hypertensive Heart and Renal Disease   ?Explore community resource options for unmet needs related to assistance with housekeeping ?SW Interventions:  ?Inter-disciplinary care team collaboration (see longitudinal plan of care) ?Collaboration with Glendale Chard, MD regarding development and update of comprehensive plan of care as evidenced by provider attestation and co-signature ?Inbound call received from [facility] In Home Aid program ?Patient successfully placed on wait list for care in the home ?Goal met ? ?Patient Goals/Self-Care Activities ?patient will:  ? -  Engage with the in home aid program as needed ?-Contact SW as needed ?-Remain engaged with Pharmacist to address care management needs ? ? ? ?  ?  ? ?Follow Up Plan:  No planned SW follow up at this time. Patient will remain engaged with PharmD to address care management needs. ?     ?Daneen Schick, BSW, CDP ?Social Worker, Certified Dementia Practitioner ?[facility] ?[phone] ? ?   ? ? ? ? ?

## 2022-02-02 NOTE — Patient Instructions (Signed)
Social Worker Visit Information ? ?Goals we discussed today:  ?Patient Goals/Self-Care Activities ?patient will:  ? - Engage with the in home aid program as needed ?-Contact SW as needed ?-Remain engaged with Pharmacist to address care management needs ? ? ? ?Follow Up Plan:  No Sw follow up planned at this time ? ? ?Daneen Schick, BSW, CDP ?Social Worker, Certified Dementia Practitioner ?[facility] ?[phone] ? ?   ? ?

## 2022-02-05 DIAGNOSIS — M1711 Unilateral primary osteoarthritis, right knee: Secondary | ICD-10-CM | POA: Diagnosis not present

## 2022-02-12 ENCOUNTER — Other Ambulatory Visit: Payer: Self-pay | Admitting: Internal Medicine

## 2022-02-12 DIAGNOSIS — M1711 Unilateral primary osteoarthritis, right knee: Secondary | ICD-10-CM | POA: Diagnosis not present

## 2022-02-26 ENCOUNTER — Other Ambulatory Visit (INDEPENDENT_AMBULATORY_CARE_PROVIDER_SITE_OTHER): Payer: Self-pay

## 2022-02-26 ENCOUNTER — Ambulatory Visit (INDEPENDENT_AMBULATORY_CARE_PROVIDER_SITE_OTHER): Payer: Medicare Other | Admitting: Internal Medicine

## 2022-02-26 ENCOUNTER — Encounter: Payer: Self-pay | Admitting: Internal Medicine

## 2022-02-26 ENCOUNTER — Other Ambulatory Visit: Payer: Self-pay

## 2022-02-26 VITALS — BP 120/70 | Temp 98.0°F | Ht <= 58 in | Wt 159.4 lb

## 2022-02-26 DIAGNOSIS — E7800 Pure hypercholesterolemia, unspecified: Secondary | ICD-10-CM

## 2022-02-26 DIAGNOSIS — Z6834 Body mass index (BMI) 34.0-34.9, adult: Secondary | ICD-10-CM

## 2022-02-26 DIAGNOSIS — E6609 Other obesity due to excess calories: Secondary | ICD-10-CM

## 2022-02-26 DIAGNOSIS — M19042 Primary osteoarthritis, left hand: Secondary | ICD-10-CM

## 2022-02-26 DIAGNOSIS — M19041 Primary osteoarthritis, right hand: Secondary | ICD-10-CM | POA: Diagnosis not present

## 2022-02-26 MED ORDER — FLUOCINONIDE 0.05 % EX CREA: 1.0000 "application " | TOPICAL_CREAM | Freq: Two times a day (BID) | CUTANEOUS | 2 refills | Status: DC

## 2022-02-26 NOTE — Patient Instructions (Signed)
Mediterranean Diet °A Mediterranean diet refers to food and lifestyle choices that are based on the traditions of countries located on the [place] Sea. It focuses on eating more fruits, vegetables, whole grains, beans, nuts, seeds, and heart-healthy fats, and eating less dairy, meat, eggs, and processed foods with added sugar, salt, and fat. This way of eating has been shown to help prevent certain conditions and improve outcomes for people who have chronic diseases, like kidney disease and heart disease. °What are tips for following this plan? °Reading food labels °Check the serving size of packaged foods. For foods such as rice and pasta, the serving size refers to the amount of cooked product, not dry. °Check the total fat in packaged foods. Avoid foods that have saturated fat or trans fats. °Check the ingredient list for added sugars, such as corn syrup. °Shopping ° °Buy a variety of foods that offer a balanced diet, including: °Fresh fruits and vegetables (produce). °Grains, beans, nuts, and seeds. Some of these may be available in unpackaged forms or large amounts (in bulk). °Fresh seafood. °Poultry and eggs. °Low-fat dairy products. °Buy whole ingredients instead of prepackaged foods. °Buy fresh fruits and vegetables in-season from local farmers markets. °Buy plain frozen fruits and vegetables. °If you do not have access to quality fresh seafood, buy precooked frozen shrimp or canned fish, such as tuna, salmon, or sardines. °Stock your pantry so you always have certain foods on hand, such as olive oil, canned tuna, canned tomatoes, rice, pasta, and beans. °Cooking °Cook foods with extra-virgin olive oil instead of using butter or other vegetable oils. °Have meat as a side dish, and have vegetables or grains as your main dish. This means having meat in small portions or adding small amounts of meat to foods like pasta or stew. °Use beans or vegetables instead of meat in common dishes like chili or  lasagna. °Experiment with different cooking methods. Try roasting, broiling, steaming, and sautéing vegetables. °Add frozen vegetables to soups, stews, pasta, or rice. °Add nuts or seeds for added healthy fats and plant protein at each meal. You can add these to yogurt, salads, or vegetable dishes. °Marinate fish or vegetables using olive oil, lemon juice, garlic, and fresh herbs. °Meal planning °Plan to eat one vegetarian meal one day each week. Try to work up to two vegetarian meals, if possible. °Eat seafood two or more times a week. °Have healthy snacks readily available, such as: °Vegetable sticks with hummus. °Greek yogurt. °Fruit and nut trail mix. °Eat balanced meals throughout the week. This includes: °Fruit: 2-3 servings a day. °Vegetables: 4-5 servings a day. °Low-fat dairy: 2 servings a day. °Fish, poultry, or lean meat: 1 serving a day. °Beans and legumes: 2 or more servings a week. °Nuts and seeds: 1-2 servings a day. °Whole grains: 6-8 servings a day. °Extra-virgin olive oil: 3-4 servings a day. °Limit red meat and sweets to only a few servings a month. °Lifestyle ° °Cook and eat meals together with your family, when possible. °Drink enough fluid to keep your urine pale yellow. °Be physically active every day. This includes: °Aerobic exercise like running or swimming. °Leisure activities like gardening, walking, or housework. °Get 7-8 hours of sleep each night. °If recommended by your health care provider, drink red wine in moderation. This means 1 glass a day for nonpregnant women and 2 glasses a day for men. A glass of wine equals 5 oz (150 mL). °What foods should I eat? °Fruits °Apples. Apricots. Avocado. Berries. Bananas. Cherries. Dates.   Figs. Grapes. Lemons. Melon. Oranges. Peaches. Plums. Pomegranate. °Vegetables °Artichokes. Beets. Broccoli. Cabbage. Carrots. Eggplant. Green beans. Chard. Kale. Spinach. Onions. Leeks. Peas. Squash. Tomatoes. Peppers. Radishes. °Grains °Whole-grain pasta. Brown  rice. Bulgur wheat. Polenta. Couscous. Whole-wheat bread. Oatmeal. Quinoa. °Meats and other proteins °Beans. Almonds. Sunflower seeds. Pine nuts. Peanuts. Cod. Salmon. Scallops. Shrimp. Tuna. Tilapia. Clams. Oysters. Eggs. Poultry without skin. °Dairy °Low-fat milk. Cheese. Greek yogurt. °Fats and oils °Extra-virgin olive oil. Avocado oil. Grapeseed oil. °Beverages °Water. Red wine. Herbal tea. °Sweets and desserts °Greek yogurt with honey. Baked apples. Poached pears. Trail mix. °Seasonings and condiments °Basil. Cilantro. Coriander. Cumin. Mint. Parsley. Sage. Rosemary. Tarragon. Garlic. Oregano. Thyme. Pepper. Balsamic vinegar. Tahini. Hummus. Tomato sauce. Olives. Mushrooms. °The items listed above may not be a complete list of foods and beverages you can eat. Contact a dietitian for more information. °What foods should I limit? °This is a list of foods that should be eaten rarely or only on special occasions. °Fruits °Fruit canned in syrup. °Vegetables °Deep-fried potatoes (french fries). °Grains °Prepackaged pasta or rice dishes. Prepackaged cereal with added sugar. Prepackaged snacks with added sugar. °Meats and other proteins °Beef. Pork. Lamb. Poultry with skin. Hot dogs. Bacon. °Dairy °Ice cream. Sour cream. Whole milk. °Fats and oils °Butter. Canola oil. Vegetable oil. Beef fat (tallow). Lard. °Beverages °Juice. Sugar-sweetened soft drinks. Beer. Liquor and spirits. °Sweets and desserts °Cookies. Cakes. Pies. Candy. °Seasonings and condiments °Mayonnaise. Pre-made sauces and marinades. °The items listed above may not be a complete list of foods and beverages you should limit. Contact a dietitian for more information. °Summary °The Mediterranean diet includes both food and lifestyle choices. °Eat a variety of fresh fruits and vegetables, beans, nuts, seeds, and whole grains. °Limit the amount of red meat and sweets that you eat. °If recommended by your health care provider, drink red wine in moderation.  This means 1 glass a day for nonpregnant women and 2 glasses a day for men. A glass of wine equals 5 oz (150 mL). °This information is not intended to replace advice given to you by your health care provider. Make sure you discuss any questions you have with your health care provider. °Document Revised: 12/25/2019 Document Reviewed: 10/22/2019 °Elsevier Patient Education © 2022 Elsevier Inc. ° °

## 2022-02-26 NOTE — Progress Notes (Signed)
?[employer] as a Education administrator for [name], MD.,have documented all relevant documentation on the behalf of [name], MD,as directed by  [name], MD while in the presence of [name], MD.  ?This visit occurred during the SARS-CoV-2 public health emergency.  Safety protocols were in place, including screening questions prior to the visit, additional usage of staff PPE, and extensive cleaning of exam room while observing appropriate contact time as indicated for disinfecting solutions. ? ?Subjective:  ?  ? Patient ID: Wayne [name] , male    DOB: 07-18-1926 , 88 y.o.   MRN: [id number] ? ? ?Chief Complaint  ?Patient presents with  ? Hyperlipidemia  ? ? ?HPI ? ?The patient is here today for a cholesterol follow-up. He was started on atorvastatin given atherosclerosis of renal artery. He is currently taking MWF. He denies having any issues with his medication.  ? ?Hyperlipidemia ?This is a chronic problem. The current episode started more than 1 year ago. Current antihyperlipidemic treatment includes statins.   ? ?Past Medical History:  ?Diagnosis Date  ? Arthritis   ? Cataract   ? Glaucoma   ? Hypertension   ?  ? ?Family History  ?Problem Relation Age of Onset  ? Dementia Mother   ? Cancer Father   ? ? ? ?Current Outpatient Medications:  ?  amLODipine (NORVASC) 5 MG tablet, TAKE 1 TABLET BY MOUTH  DAILY, Disp: 90 tablet, Rfl: 3 ?  aspirin EC 81 MG tablet, Take 81 mg by mouth daily., Disp: , Rfl:  ?  atorvastatin (LIPITOR) 20 MG tablet, TAKE M,W,F ONLY., Disp: 45 tablet, Rfl: 11 ?  B Complex-C (B-COMPLEX WITH VITAMIN C) tablet, Take 1 tablet by mouth daily., Disp: , Rfl:  ?  Cholecalciferol (VITAMIN D3) 125 MCG (5000 UT) CAPS, Take by mouth daily. Monday, Wednesday, Friday, Disp: , Rfl:  ?  diclofenac Sodium (VOLTAREN) 1 % GEL, Apply 2 g topically 4 (four) times daily., Disp: 150 g, Rfl: 1 ?  fluocinonide cream (LIDEX) 4.97 %, Apply 1 application. topically 2 (two) times daily.,  Disp: 120 g, Rfl: 2 ?  furosemide (LASIX) 40 MG tablet, Take 40 mg by mouth daily., Disp: , Rfl:  ?  gabapentin (NEURONTIN) 300 MG capsule, Take 1 capsule (300 mg total) by mouth 2 (two) times daily., Disp: 180 capsule, Rfl: 2 ?  ketoconazole (NIZORAL) 2 % cream, Apply 1 application topically daily., Disp: 15 g, Rfl: 1 ?  Multiple Vitamin (MULTIVITAMIN WITH MINERALS) TABS tablet, Take 1 tablet by mouth daily., Disp: , Rfl:  ?  NON FORMULARY, CBD gummies, Disp: , Rfl:  ?  Omega-3 Fatty Acids (FISH OIL) 1200 MG CAPS, Take 1 capsule by mouth daily. , Disp: , Rfl:  ?  oxyCODONE-acetaminophen (PERCOCET/ROXICET) 5-325 MG tablet, Take 1-2 tablets by mouth daily as needed for pain. , Disp: , Rfl: 0 ?  Saw Palmetto 450 MG CAPS, Take 2 capsules by mouth daily., Disp: , Rfl:  ?  senna (SENOKOT) 8.6 MG tablet, Take 1 tablet by mouth daily., Disp: , Rfl:  ?  tamsulosin (FLOMAX) 0.4 MG CAPS capsule, Take 0.4 mg by mouth daily. , Disp: , Rfl:  ?  timolol (BETIMOL) 0.5 % ophthalmic solution, Place 1 drop into both eyes daily. , Disp: , Rfl:  ?  valsartan-hydrochlorothiazide (DIOVAN-HCT) 80-12.5 MG tablet, Take 1 tablet by mouth daily., Disp: 90 tablet, Rfl: 2 ?  loratadine (CLARITIN) 10 MG tablet, Take 1 tablet (10 mg total) by  mouth daily., Disp: 90 tablet, Rfl: 2  ? ?Allergies  ?Allergen Reactions  ? Cephalexin   ?  Severe mouth and throat dryness.  ?  ? ?Review of Systems  ?Constitutional: Negative.   ?Respiratory: Negative.    ?Cardiovascular: Negative.   ?Gastrointestinal: Negative.   ?Musculoskeletal:  Positive for arthralgias.  ?Psychiatric/Behavioral: Negative.    ?All other systems reviewed and are negative.  ? ?Today's Vitals  ? 02/26/22 1601 02/26/22 1616  ?BP: (!) 148/72 120/70  ?Temp: 98 ?F (36.7 ?C)   ?Weight: 159 lb 6.4 oz (72.3 kg)   ?Height: '4\' 9"'$  (1.448 m)   ?PainSc: 7    ?PainLoc: Foot   ? ?Body mass index is 34.49 kg/m?.  ?Wt Readings from Last 3 Encounters:  ?02/26/22 159 lb 6.4 oz (72.3 kg)  ?01/11/22 164 lb  7.4 oz (74.6 kg)  ?01/11/22 164 lb 6.4 oz (74.6 kg)  ?  ?BP Readings from Last 3 Encounters:  ?02/26/22 120/70  ?01/11/22 124/68  ?01/11/22 124/68  ?  ?Objective:  ?Physical Exam ?Vitals and nursing note reviewed.  ?Constitutional:   ?   Appearance: Normal appearance.  ?HENT:  ?   Head: Normocephalic and atraumatic.  ?   Nose:  ?   Comments: Masked  ?   Mouth/Throat:  ?   Comments: Masked  ?Eyes:  ?   Extraocular Movements: Extraocular movements intact.  ?Cardiovascular:  ?   Rate and Rhythm: Normal rate and regular rhythm.  ?   Heart sounds: Normal heart sounds.  ?Pulmonary:  ?   Effort: Pulmonary effort is normal.  ?   Breath sounds: Normal breath sounds.  ?Musculoskeletal:     ?   General: Deformity present.  ?Skin: ?   General: Skin is warm.  ?Neurological:  ?   General: No focal deficit present.  ?   Mental Status: He is alert.  ?Psychiatric:     ?   Mood and Affect: Mood normal.  ?    ?Assessment And Plan:  ?   ?1. Pure hypercholesterolemia ?Comments: Chronic, I will check non-fasting lipid panel and ALT. He will rto in 3-4 months as previously scheduled.  ?- Lipid panel ?- ALT ? ?2. Primary osteoarthritis of both hands ?Comments: He reports having mod-severe symptoms. I will refer him to Pain Clinic as requested.  He is encouraged to follow anti-inflammatory diet.  ?- Ambulatory referral to Pain Clinic ? ?3. Class 1 obesity due to excess calories with serious comorbidity and body mass index (BMI) of 34.0 to 34.9 in adult ? He is advised to aim for at least 150 minutes of exercise per week.  ? ? ?Patient was given opportunity to ask questions. Patient verbalized understanding of the plan and was able to repeat key elements of the plan. All questions were answered to their satisfaction.  ? ? ?I, [name], MD, have reviewed all documentation for this visit. The documentation on 02/26/22 for the exam, diagnosis, procedures, and orders are all accurate and complete.  ? ?IF YOU HAVE BEEN REFERRED TO A  SPECIALIST, IT MAY TAKE 1-2 WEEKS TO SCHEDULE/PROCESS THE REFERRAL. IF YOU HAVE NOT HEARD FROM US/SPECIALIST IN TWO WEEKS, PLEASE GIVE [place] A CALL AT [phone] X [phone].  ? ?THE PATIENT IS ENCOURAGED TO PRACTICE SOCIAL DISTANCING DUE TO THE COVID-19 PANDEMIC.   ?

## 2022-03-14 ENCOUNTER — Other Ambulatory Visit: Payer: Self-pay

## 2022-03-14 MED ORDER — ATORVASTATIN CALCIUM 20 MG PO TABS: ORAL_TABLET | ORAL | 2 refills | Status: DC

## 2022-03-14 MED ORDER — ATORVASTATIN CALCIUM 20 MG PO TABS: ORAL_TABLET | ORAL | 0 refills | Status: DC

## 2022-03-14 MED ORDER — FLUOCINONIDE 0.05 % EX CREA: 1.0000 "application " | TOPICAL_CREAM | Freq: Two times a day (BID) | CUTANEOUS | 2 refills | Status: DC

## 2022-04-09 ENCOUNTER — Telehealth: Payer: Self-pay

## 2022-04-09 NOTE — Chronic Care Management (AMB) (Signed)
? ? ?Chronic Care Management ?Pharmacy Assistant  ? ?Name: Wayne [name]  MRN: [id number] DOB: 01/17/1926 ? ? ?Reason for Encounter: Disease State/ General ? ?Recent office visits:  ?02-26-2022 Glendale Chard, MD. Referral placed to pain clinic. ? ?02-02-2022 Daneen Schick (CCM) ? ?01-30-2022  Daneen Schick (CCM) ? ?Recent consult visits:  ?None ? ?Hospital visits:  ?None in previous 6 months ? ?Medications: ?Outpatient Encounter Medications as of 04/09/2022  ?Medication Sig  ? amLODipine (NORVASC) 5 MG tablet TAKE 1 TABLET BY MOUTH  DAILY  ? aspirin EC 81 MG tablet Take 81 mg by mouth daily.  ? atorvastatin (LIPITOR) 20 MG tablet TAKE M,W,F ONLY.  ? B Complex-C (B-COMPLEX WITH VITAMIN C) tablet Take 1 tablet by mouth daily.  ? Cholecalciferol (VITAMIN D3) 125 MCG (5000 UT) CAPS Take by mouth daily. Monday, Wednesday, Friday  ? diclofenac Sodium (VOLTAREN) 1 % GEL Apply 2 g topically 4 (four) times daily.  ? fluocinonide cream (LIDEX) 4.48 % Apply 1 application. topically 2 (two) times daily.  ? furosemide (LASIX) 40 MG tablet Take 40 mg by mouth daily.  ? gabapentin (NEURONTIN) 300 MG capsule Take 1 capsule (300 mg total) by mouth 2 (two) times daily.  ? ketoconazole (NIZORAL) 2 % cream Apply 1 application topically daily.  ? loratadine (CLARITIN) 10 MG tablet Take 1 tablet (10 mg total) by mouth daily.  ? Multiple Vitamin (MULTIVITAMIN WITH MINERALS) TABS tablet Take 1 tablet by mouth daily.  ? NON FORMULARY CBD gummies  ? Omega-3 Fatty Acids (FISH OIL) 1200 MG CAPS Take 1 capsule by mouth daily.   ? oxyCODONE-acetaminophen (PERCOCET/ROXICET) 5-325 MG tablet Take 1-2 tablets by mouth daily as needed for pain.   ? Saw Palmetto 450 MG CAPS Take 2 capsules by mouth daily.  ? senna (SENOKOT) 8.6 MG tablet Take 1 tablet by mouth daily.  ? tamsulosin (FLOMAX) 0.4 MG CAPS capsule Take 0.4 mg by mouth daily.   ? timolol (BETIMOL) 0.5 % ophthalmic solution Place 1 drop into both eyes daily.   ?  valsartan-hydrochlorothiazide (DIOVAN-HCT) 80-12.5 MG tablet Take 1 tablet by mouth daily.  ? ?No facility-administered encounter medications on file as of 04/09/2022.  ?[place] for General Review Call ? ? ?Chart Review: ? ?Have there been any documented new, changed, or discontinued medications since last visit? No  ?Has there been any documented recent hospitalizations or ED visits since last visit with Clinical Pharmacist? No ?Brief Summary: None ? ?Adherence Review: ? ?Does the Clinical Pharmacist Assistant have access to adherence rates? No ?Adherence rates for STAR metric medications: Not available ?Adherence rates for medications indicated for disease state being reviewed: Not available ?Does the patient have >5 day gap between last estimated fill dates for any of the above medications or other medication gaps? No ?Reason for medication gaps: None ? ? ?Disease State Questions: ? ?Able to connect with Patient? Yes ? ?Did patient have any problems with their health recently? No ? ?Have you had any admissions or emergency room visits or worsening of your condition(s) since last visit? No ? ?Have you had any visits with new specialists or providers since your last visit? No ? ?Have you had any new health care problem(s) since your last visit? No ? ?Have you run out of any of your medications since you last spoke with clinical pharmacist? No ? ?Are there any medications you are not taking as prescribed? No ? ?Are you having any issues or side effects with your  medications? No ? ?Do you have any other health concerns or questions you want to discuss with your Clinical Pharmacist before your next visit? No ? ?Are there any health concerns that you feel we can do a better job addressing? No ? ?Are you having any problems with any of the following since the last visit: (select all that apply) ? None ? ?12. Any falls since last visit? Yes ? Details: Patient stated he slid on a rug last month ? ?13. Any  increased or uncontrolled pain since last visit? No ? ?14. Next visit Type: office ?      Visit with: Dr. Baird Cancer ?       Date: 05-16-2022 ?       Time: 11:20 ? ?15. Additional Details? No  ? ?Care Gaps: ?Covid booster overdue ?AWV 01-30-2022 ? ?Star Rating Drugs: ?Atorvastatin 40 mg- Last filled 01-12-2022 90 DS [facility]. ?Valsartan/HCTZ 80-12.5 mg- Last filled 11-22-2021 90 DS [facility] (Patient stated he has a 3 bottles of medication. Asked patient to look at the expiration date on bottles and 2 were expired. Patient just started taking the bottle filled on 11-22-2021. Instructed patient to not take expired medication. Contacted [facility] and was told last filled was 11-22-2021 with 2 refills remaining.) ? ?Malecca Hicks CMA ?Clinical Pharmacist Assistant ?[phone] ? ?

## 2022-04-13 LAB — CBC AND DIFFERENTIAL
HCT: 38 — AB (ref 41–53)
Hemoglobin: 12.9 — AB (ref 13.5–17.5)
Platelets: 194 10*3/uL (ref 150–400)
WBC: 4.2

## 2022-04-13 LAB — BASIC METABOLIC PANEL
BUN: 27 — AB (ref 4–21)
CO2: 27 — AB (ref 13–22)
Chloride: 103 (ref 99–108)
Creatinine: 1.4 — AB (ref 0.6–1.3)
Glucose: 91
Potassium: 4 mEq/L (ref 3.5–5.1)
Sodium: 140 (ref 137–147)

## 2022-04-13 LAB — CBC: RBC: 3.87 (ref 3.87–5.11)

## 2022-04-13 LAB — COMPREHENSIVE METABOLIC PANEL
Albumin: 4.3 (ref 3.5–5.0)
Calcium: 9.7 (ref 8.7–10.7)

## 2022-04-16 ENCOUNTER — Other Ambulatory Visit: Payer: Self-pay | Admitting: Nephrology

## 2022-04-16 DIAGNOSIS — R609 Edema, unspecified: Secondary | ICD-10-CM

## 2022-04-16 DIAGNOSIS — D631 Anemia in chronic kidney disease: Secondary | ICD-10-CM

## 2022-04-16 DIAGNOSIS — N2581 Secondary hyperparathyroidism of renal origin: Secondary | ICD-10-CM

## 2022-04-16 DIAGNOSIS — R10A2 Flank pain, left side: Secondary | ICD-10-CM

## 2022-04-16 DIAGNOSIS — N1832 Chronic kidney disease, stage 3b: Secondary | ICD-10-CM

## 2022-04-16 DIAGNOSIS — N189 Chronic kidney disease, unspecified: Secondary | ICD-10-CM

## 2022-04-16 DIAGNOSIS — R809 Proteinuria, unspecified: Secondary | ICD-10-CM

## 2022-04-16 DIAGNOSIS — N261 Atrophy of kidney (terminal): Secondary | ICD-10-CM

## 2022-04-16 DIAGNOSIS — I129 Hypertensive chronic kidney disease with stage 1 through stage 4 chronic kidney disease, or unspecified chronic kidney disease: Secondary | ICD-10-CM

## 2022-04-16 DIAGNOSIS — R109 Unspecified abdominal pain: Secondary | ICD-10-CM

## 2022-04-17 ENCOUNTER — Ambulatory Visit
Admission: RE | Admit: 2022-04-17 | Discharge: 2022-04-17 | Disposition: A | Discharge status: Home / Self Care | Payer: Medicare Other | Source: Ambulatory Visit | Attending: Nephrology | Admitting: Nephrology

## 2022-04-17 DIAGNOSIS — R109 Unspecified abdominal pain: Secondary | ICD-10-CM

## 2022-04-17 DIAGNOSIS — D631 Anemia in chronic kidney disease: Secondary | ICD-10-CM

## 2022-04-17 DIAGNOSIS — N261 Atrophy of kidney (terminal): Secondary | ICD-10-CM

## 2022-04-17 DIAGNOSIS — R609 Edema, unspecified: Secondary | ICD-10-CM

## 2022-04-17 DIAGNOSIS — R809 Proteinuria, unspecified: Secondary | ICD-10-CM

## 2022-04-17 DIAGNOSIS — N2581 Secondary hyperparathyroidism of renal origin: Secondary | ICD-10-CM

## 2022-04-17 DIAGNOSIS — I129 Hypertensive chronic kidney disease with stage 1 through stage 4 chronic kidney disease, or unspecified chronic kidney disease: Secondary | ICD-10-CM

## 2022-04-17 DIAGNOSIS — R10A2 Flank pain, left side: Secondary | ICD-10-CM

## 2022-04-17 DIAGNOSIS — N189 Chronic kidney disease, unspecified: Secondary | ICD-10-CM

## 2022-04-17 DIAGNOSIS — N1832 Chronic kidney disease, stage 3b: Secondary | ICD-10-CM

## 2022-04-24 ENCOUNTER — Other Ambulatory Visit: Payer: Self-pay | Admitting: Nephrology

## 2022-04-24 DIAGNOSIS — N2889 Other specified disorders of kidney and ureter: Secondary | ICD-10-CM

## 2022-05-02 ENCOUNTER — Encounter: Payer: Self-pay | Admitting: Internal Medicine

## 2022-05-10 ENCOUNTER — Other Ambulatory Visit: Payer: Self-pay | Admitting: Internal Medicine

## 2022-05-10 ENCOUNTER — Ambulatory Visit
Admission: RE | Admit: 2022-05-10 | Discharge: 2022-05-10 | Disposition: A | Discharge status: Home / Self Care | Payer: Medicare Other | Source: Ambulatory Visit | Attending: Nephrology | Admitting: Nephrology

## 2022-05-10 DIAGNOSIS — N2889 Other specified disorders of kidney and ureter: Secondary | ICD-10-CM

## 2022-05-10 MED ORDER — GADOBENATE DIMEGLUMINE 529 MG/ML IV SOLN
15.0000 mL | Freq: Once | INTRAVENOUS | Status: AC | PRN
  Administered 2022-05-10: 15 mL via INTRAVENOUS

## 2022-05-16 ENCOUNTER — Ambulatory Visit (INDEPENDENT_AMBULATORY_CARE_PROVIDER_SITE_OTHER): Payer: Medicare Other | Admitting: Internal Medicine

## 2022-05-16 ENCOUNTER — Encounter: Payer: Self-pay | Admitting: Internal Medicine

## 2022-05-16 VITALS — BP 110/60 | HR 70 | Temp 98.6°F | Ht <= 58 in | Wt 155.6 lb

## 2022-05-16 DIAGNOSIS — I1310 Hypertensive heart and chronic kidney disease without heart failure, with stage 1 through stage 4 chronic kidney disease, or unspecified chronic kidney disease: Secondary | ICD-10-CM

## 2022-05-16 DIAGNOSIS — I701 Atherosclerosis of renal artery: Secondary | ICD-10-CM | POA: Diagnosis not present

## 2022-05-16 DIAGNOSIS — N1831 Chronic kidney disease, stage 3a: Secondary | ICD-10-CM

## 2022-05-16 DIAGNOSIS — M19041 Primary osteoarthritis, right hand: Secondary | ICD-10-CM

## 2022-05-16 DIAGNOSIS — M19042 Primary osteoarthritis, left hand: Secondary | ICD-10-CM

## 2022-05-16 DIAGNOSIS — N281 Cyst of kidney, acquired: Secondary | ICD-10-CM

## 2022-05-16 DIAGNOSIS — E6609 Other obesity due to excess calories: Secondary | ICD-10-CM

## 2022-05-16 DIAGNOSIS — Z6833 Body mass index (BMI) 33.0-33.9, adult: Secondary | ICD-10-CM

## 2022-05-16 NOTE — Progress Notes (Signed)
Rich Brave Llittleton,acting as a Education administrator for [name], MD.,have documented all relevant documentation on the behalf of [name], MD,as directed by  [name], MD while in the presence of [name], MD.  This visit occurred during the SARS-CoV-2 public health emergency.  Safety protocols were in place, including screening questions prior to the visit, additional usage of staff PPE, and extensive cleaning of exam room while observing appropriate contact time as indicated for disinfecting solutions.  Subjective:     Patient ID: Wayne [name] , male    DOB: 1926-11-23 , 88 y.o.   MRN: [id number]   Chief Complaint  Patient presents with   Hypertension    HPI  Patient presents today for a bp and chol check. He reports compliance with meds. Denies headaches, chest pain and shortness of breath. He was started on atorvastatin w/ MWF dosing at last visit. He has tolerated the medication w/o any issues.   Hypertension This is a chronic problem. The current episode started more than 1 year ago. The problem has been gradually improving since onset. The problem is controlled. Pertinent negatives include no palpitations or shortness of breath. Past treatments include calcium channel blockers, angiotensin blockers, diuretics and lifestyle changes. The current treatment provides moderate improvement. Hypertensive end-organ damage includes kidney disease.     Past Medical History:  Diagnosis Date   Arthritis    Cataract    Glaucoma    Hypertension      Family History  Problem Relation Age of Onset   Dementia Mother    Cancer Father      Current Outpatient Medications:    amLODipine (NORVASC) 5 MG tablet, TAKE 1 TABLET BY MOUTH  DAILY, Disp: 90 tablet, Rfl: 3   aspirin EC 81 MG tablet, Take 81 mg by mouth daily., Disp: , Rfl:    atorvastatin (LIPITOR) 20 MG tablet, TAKE M,W,F ONLY., Disp: 45 tablet, Rfl: 0   B Complex-C (B-COMPLEX WITH VITAMIN C) tablet, Take 1  tablet by mouth daily., Disp: , Rfl:    Cholecalciferol (VITAMIN D3) 125 MCG (5000 UT) CAPS, Take by mouth daily. Monday, Wednesday, Friday, Disp: , Rfl:    diclofenac Sodium (VOLTAREN) 1 % GEL, Apply 2 g topically 4 (four) times daily., Disp: 150 g, Rfl: 1   fluocinonide cream (LIDEX) 4.13 %, Apply 1 application. topically 2 (two) times daily., Disp: 120 g, Rfl: 2   furosemide (LASIX) 40 MG tablet, Take 40 mg by mouth daily., Disp: , Rfl:    gabapentin (NEURONTIN) 300 MG capsule, TAKE 1 CAPSULE(300 MG) BY MOUTH TWICE DAILY, Disp: 180 capsule, Rfl: 2   ketoconazole (NIZORAL) 2 % cream, Apply 1 application topically daily., Disp: 15 g, Rfl: 1   loratadine (CLARITIN) 10 MG tablet, Take 1 tablet (10 mg total) by mouth daily., Disp: 90 tablet, Rfl: 2   Multiple Vitamin (MULTIVITAMIN WITH MINERALS) TABS tablet, Take 1 tablet by mouth daily., Disp: , Rfl:    NON FORMULARY, CBD gummies, Disp: , Rfl:    Omega-3 Fatty Acids (FISH OIL) 1200 MG CAPS, Take 1 capsule by mouth daily. , Disp: , Rfl:    oxyCODONE-acetaminophen (PERCOCET/ROXICET) 5-325 MG tablet, Take 1-2 tablets by mouth daily as needed for pain. , Disp: , Rfl: 0   Saw Palmetto 450 MG CAPS, Take 2 capsules by mouth daily., Disp: , Rfl:    senna (SENOKOT) 8.6 MG tablet, Take 1 tablet by mouth daily., Disp: , Rfl:    tamsulosin (  FLOMAX) 0.4 MG CAPS capsule, Take 0.4 mg by mouth daily. , Disp: , Rfl:    timolol (BETIMOL) 0.5 % ophthalmic solution, Place 1 drop into both eyes daily. , Disp: , Rfl:    valsartan-hydrochlorothiazide (DIOVAN-HCT) 80-12.5 MG tablet, Take 1 tablet by mouth daily., Disp: 90 tablet, Rfl: 2   Allergies  Allergen Reactions   Cephalexin     Severe mouth and throat dryness.     Review of Systems  Constitutional: Negative.   Eyes: Negative.   Respiratory: Negative.  Negative for shortness of breath.   Cardiovascular: Negative.  Negative for palpitations.  Gastrointestinal: Negative.   Musculoskeletal:  Positive for  arthralgias.  Neurological: Negative.   Psychiatric/Behavioral: Negative.       Today's Vitals   05/16/22 1046  BP: 110/60  Pulse: 70  Temp: 98.6 F (37 C)  Weight: 155 lb 9.6 oz (70.6 kg)  Height: '4\' 9"'$  (1.448 m)  PainSc: 4    Body mass index is 33.67 kg/m.  Wt Readings from Last 3 Encounters:  05/16/22 155 lb 9.6 oz (70.6 kg)  02/26/22 159 lb 6.4 oz (72.3 kg)  01/11/22 164 lb 7.4 oz (74.6 kg)     Objective:  Physical Exam Vitals and nursing note reviewed.  Constitutional:      Appearance: Normal appearance.  Eyes:     Extraocular Movements: Extraocular movements intact.  Cardiovascular:     Rate and Rhythm: Normal rate and regular rhythm.     Heart sounds: Normal heart sounds.  Pulmonary:     Effort: Pulmonary effort is normal.     Breath sounds: Normal breath sounds.  Musculoskeletal:        General: Swelling and deformity present.     Cervical back: Normal range of motion.  Skin:    General: Skin is warm.  Neurological:     General: No focal deficit present.     Mental Status: He is alert.  Psychiatric:        Mood and Affect: Mood normal.      Assessment And Plan:     1. Hypertensive heart and renal disease with renal failure, stage 1 through stage 4 or unspecified chronic kidney disease, without heart failure Comments: Chronic, well controlled. He is encouraged to follow low sodium diet. He will rto in 4 months for re-evaluation.   2. Atherosclerosis of renal artery (HCC) Comments: LDL goal <70. He is now on atorvastatin '20mg'$  MWF. I will check lipid panel today.  - Lipid panel - ALT  3. Stage 3a chronic kidney disease ([place]) Comments: Chronic, he is also followed by Nephrology. He is encouraged to keep BP well controlled, stay hydrated and avoid NSAIDS to decrease risk of CKD progression.   4. Primary osteoarthritis of both hands Comments: Chronic.  - Ambulatory referral to Pain Clinic  5. Complex renal cyst Comments: MR Abdomen results reviewed.  Suggestive of RCC, will repeat MR Abdomen in six months. He is encouraged to notify me if notices hematuria/worsening back pain.   6. Class 1 obesity due to excess calories with serious comorbidity and body mass index (BMI) of 33.0 to 33.9 in adult Comments: He is encouraged to perform chair exercises daily while watching TV.    Patient was given opportunity to ask questions. Patient verbalized understanding of the plan and was able to repeat key elements of the plan. All questions were answered to their satisfaction.   I, [name], MD, have reviewed all documentation for this visit. The  documentation on 05/16/22 for the exam, diagnosis, procedures, and orders are all accurate and complete.   IF YOU HAVE BEEN REFERRED TO A SPECIALIST, IT MAY TAKE 1-2 WEEKS TO SCHEDULE/PROCESS THE REFERRAL. IF YOU HAVE NOT HEARD FROM US/SPECIALIST IN TWO WEEKS, PLEASE GIVE [place] A CALL AT [phone] X [phone].   THE PATIENT IS ENCOURAGED TO PRACTICE SOCIAL DISTANCING DUE TO THE COVID-19 PANDEMIC.

## 2022-05-16 NOTE — Patient Instructions (Signed)
Hypertension, Adult ?Hypertension is another name for high blood pressure. High blood pressure forces your heart to work harder to pump blood. This can cause problems over time. ?There are two numbers in a blood pressure reading. There is a top number (systolic) over a bottom number (diastolic). It is best to have a blood pressure that is below 120/80. ?What are the causes? ?The cause of this condition is not known. Some other conditions can lead to high blood pressure. ?What increases the risk? ?Some lifestyle factors can make you more likely to develop high blood pressure: ?Smoking. ?Not getting enough exercise or physical activity. ?Being overweight. ?Having too much fat, sugar, calories, or salt (sodium) in your diet. ?Drinking too much alcohol. ?Other risk factors include: ?Having any of these conditions: ?Heart disease. ?Diabetes. ?High cholesterol. ?Kidney disease. ?Obstructive sleep apnea. ?Having a family history of high blood pressure and high cholesterol. ?Age. The risk increases with age. ?Stress. ?What are the signs or symptoms? ?High blood pressure may not cause symptoms. Very high blood pressure (hypertensive crisis) may cause: ?Headache. ?Fast or uneven heartbeats (palpitations). ?Shortness of breath. ?Nosebleed. ?Vomiting or feeling like you may vomit (nauseous). ?Changes in how you see. ?Very bad chest pain. ?Feeling dizzy. ?Seizures. ?How is this treated? ?This condition is treated by making healthy lifestyle changes, such as: ?Eating healthy foods. ?Exercising more. ?Drinking less alcohol. ?Your doctor may prescribe medicine if lifestyle changes do not help enough and if: ?Your top number is above 130. ?Your bottom number is above 80. ?Your personal target blood pressure may vary. ?Follow these instructions at home: ?Eating and drinking ? ?If told, follow the DASH eating plan. To follow this plan: ?Fill one half of your plate at each meal with fruits and vegetables. ?Fill one fourth of your plate  at each meal with whole grains. Whole grains include whole-wheat pasta, brown rice, and whole-grain bread. ?Eat or drink low-fat dairy products, such as skim milk or low-fat yogurt. ?Fill one fourth of your plate at each meal with low-fat (lean) proteins. Low-fat proteins include fish, chicken without skin, eggs, beans, and tofu. ?Avoid fatty meat, cured and processed meat, or chicken with skin. ?Avoid pre-made or processed food. ?Limit the amount of salt in your diet to less than 1,500 mg each day. ?Do not drink alcohol if: ?Your doctor tells you not to drink. ?You are pregnant, may be pregnant, or are planning to become pregnant. ?If you drink alcohol: ?Limit how much you have to: ?0-1 drink a day for women. ?0-2 drinks a day for men. ?Know how much alcohol is in your drink. In the [place] one drink equals one 12 oz bottle of beer (355 mL), one 5 oz glass of wine (148 mL), or one 1? oz glass of hard liquor (44 mL). ?Lifestyle ? ?Work with your doctor to stay at a healthy weight or to lose weight. Ask your doctor what the best weight is for you. ?Get at least 30 minutes of exercise that causes your heart to beat faster (aerobic exercise) most days of the week. This may include walking, swimming, or biking. ?Get at least 30 minutes of exercise that strengthens your muscles (resistance exercise) at least 3 days a week. This may include lifting weights or doing Pilates. ?Do not smoke or use any products that contain nicotine or tobacco. If you need help quitting, ask your doctor. ?Check your blood pressure at home as told by your doctor. ?Keep all follow-up visits. ?Medicines ?Take over-the-counter and prescription medicines   only as told by your doctor. Follow directions carefully. ?Do not skip doses of blood pressure medicine. The medicine does not work as well if you skip doses. Skipping doses also puts you at risk for problems. ?Ask your doctor about side effects or reactions to medicines that you should watch  for. ?Contact a doctor if: ?You think you are having a reaction to the medicine you are taking. ?You have headaches that keep coming back. ?You feel dizzy. ?You have swelling in your ankles. ?You have trouble with your vision. ?Get help right away if: ?You get a very bad headache. ?You start to feel mixed up (confused). ?You feel weak or numb. ?You feel faint. ?You have very bad pain in your: ?Chest. ?Belly (abdomen). ?You vomit more than once. ?You have trouble breathing. ?These symptoms may be an emergency. Get help right away. Call 911. ?Do not wait to see if the symptoms will go away. ?Do not drive yourself to the hospital. ?Summary ?Hypertension is another name for high blood pressure. ?High blood pressure forces your heart to work harder to pump blood. ?For most people, a normal blood pressure is less than 120/80. ?Making healthy choices can help lower blood pressure. If your blood pressure does not get lower with healthy choices, you may need to take medicine. ?This information is not intended to replace advice given to you by your health care provider. Make sure you discuss any questions you have with your health care provider. ?Document Revised: 09/07/2021 Document Reviewed: 09/07/2021 ?Elsevier Patient Education ? 2023 Elsevier Inc. ? ?

## 2022-05-17 LAB — LIPID PANEL
Chol/HDL Ratio: 2.4 ratio (ref 0.0–5.0)
Cholesterol, Total: 125 mg/dL (ref 100–199)
HDL: 53 mg/dL (ref >39)
LDL Chol Calc (NIH): 58 mg/dL (ref 0–99)
Triglycerides: 69 mg/dL (ref 0–149)
VLDL Cholesterol Cal: 14 mg/dL (ref 5–40)

## 2022-05-17 LAB — ALT: ALT: 16 IU/L (ref 0–44)

## 2022-05-18 ENCOUNTER — Other Ambulatory Visit: Payer: Self-pay

## 2022-05-18 MED ORDER — ATORVASTATIN CALCIUM 20 MG PO TABS: ORAL_TABLET | ORAL | 1 refills | Status: DC

## 2022-05-24 ENCOUNTER — Other Ambulatory Visit: Payer: Self-pay

## 2022-05-24 MED ORDER — AMLODIPINE BESYLATE 5 MG PO TABS: ORAL_TABLET | ORAL | 3 refills | Status: DC

## 2022-06-14 DIAGNOSIS — N1832 Chronic kidney disease, stage 3b: Secondary | ICD-10-CM | POA: Diagnosis not present

## 2022-06-14 DIAGNOSIS — N2889 Other specified disorders of kidney and ureter: Secondary | ICD-10-CM | POA: Diagnosis not present

## 2022-06-14 DIAGNOSIS — R609 Edema, unspecified: Secondary | ICD-10-CM | POA: Diagnosis not present

## 2022-06-14 DIAGNOSIS — N2581 Secondary hyperparathyroidism of renal origin: Secondary | ICD-10-CM | POA: Diagnosis not present

## 2022-06-14 DIAGNOSIS — I129 Hypertensive chronic kidney disease with stage 1 through stage 4 chronic kidney disease, or unspecified chronic kidney disease: Secondary | ICD-10-CM | POA: Diagnosis not present

## 2022-06-14 DIAGNOSIS — D631 Anemia in chronic kidney disease: Secondary | ICD-10-CM | POA: Diagnosis not present

## 2022-06-14 DIAGNOSIS — R109 Unspecified abdominal pain: Secondary | ICD-10-CM | POA: Diagnosis not present

## 2022-06-14 DIAGNOSIS — R809 Proteinuria, unspecified: Secondary | ICD-10-CM | POA: Diagnosis not present

## 2022-06-14 DIAGNOSIS — N261 Atrophy of kidney (terminal): Secondary | ICD-10-CM | POA: Diagnosis not present

## 2022-06-14 LAB — CBC AND DIFFERENTIAL
HCT: 36 — AB (ref 41–53)
Hemoglobin: 12.4 — AB (ref 13.5–17.5)
Neutrophils Absolute: 2.4
WBC: 4.6

## 2022-06-14 LAB — BASIC METABOLIC PANEL
BUN: 29 — AB (ref 4–21)
CO2: 28 — AB (ref 13–22)
Chloride: 102 (ref 99–108)
Creatinine: 1.5 — AB (ref 0.6–1.3)
Glucose: 87
Potassium: 4.1 mEq/L (ref 3.5–5.1)
Sodium: 141 (ref 137–147)

## 2022-06-14 LAB — COMPREHENSIVE METABOLIC PANEL
Albumin: 4.2 (ref 3.5–5.0)
Calcium: 9.4 (ref 8.7–10.7)
eGFR: 42

## 2022-06-14 LAB — CBC: RBC: 3.73 — AB (ref 3.87–5.11)

## 2022-06-15 DIAGNOSIS — Z0130 Encounter for examination of blood pressure without abnormal findings: Secondary | ICD-10-CM | POA: Diagnosis not present

## 2022-06-15 DIAGNOSIS — E7800 Pure hypercholesterolemia, unspecified: Secondary | ICD-10-CM | POA: Diagnosis not present

## 2022-06-15 DIAGNOSIS — M549 Dorsalgia, unspecified: Secondary | ICD-10-CM | POA: Diagnosis not present

## 2022-06-15 DIAGNOSIS — Z79899 Other long term (current) drug therapy: Secondary | ICD-10-CM | POA: Diagnosis not present

## 2022-06-15 DIAGNOSIS — G8929 Other chronic pain: Secondary | ICD-10-CM | POA: Diagnosis not present

## 2022-06-15 DIAGNOSIS — G5602 Carpal tunnel syndrome, left upper limb: Secondary | ICD-10-CM | POA: Diagnosis not present

## 2022-06-15 DIAGNOSIS — M19042 Primary osteoarthritis, left hand: Secondary | ICD-10-CM | POA: Diagnosis not present

## 2022-06-15 DIAGNOSIS — M19041 Primary osteoarthritis, right hand: Secondary | ICD-10-CM | POA: Diagnosis not present

## 2022-06-15 DIAGNOSIS — M47816 Spondylosis without myelopathy or radiculopathy, lumbar region: Secondary | ICD-10-CM | POA: Diagnosis not present

## 2022-06-15 DIAGNOSIS — I10 Essential (primary) hypertension: Secondary | ICD-10-CM | POA: Diagnosis not present

## 2022-06-22 DIAGNOSIS — Z0130 Encounter for examination of blood pressure without abnormal findings: Secondary | ICD-10-CM | POA: Diagnosis not present

## 2022-06-22 DIAGNOSIS — M19041 Primary osteoarthritis, right hand: Secondary | ICD-10-CM | POA: Diagnosis not present

## 2022-06-22 DIAGNOSIS — E7800 Pure hypercholesterolemia, unspecified: Secondary | ICD-10-CM | POA: Diagnosis not present

## 2022-06-22 DIAGNOSIS — I10 Essential (primary) hypertension: Secondary | ICD-10-CM | POA: Diagnosis not present

## 2022-06-22 DIAGNOSIS — M47816 Spondylosis without myelopathy or radiculopathy, lumbar region: Secondary | ICD-10-CM | POA: Diagnosis not present

## 2022-06-22 DIAGNOSIS — M19042 Primary osteoarthritis, left hand: Secondary | ICD-10-CM | POA: Diagnosis not present

## 2022-06-22 DIAGNOSIS — M792 Neuralgia and neuritis, unspecified: Secondary | ICD-10-CM | POA: Diagnosis not present

## 2022-06-22 DIAGNOSIS — G5602 Carpal tunnel syndrome, left upper limb: Secondary | ICD-10-CM | POA: Diagnosis not present

## 2022-06-22 DIAGNOSIS — Z79899 Other long term (current) drug therapy: Secondary | ICD-10-CM | POA: Diagnosis not present

## 2022-06-22 DIAGNOSIS — M549 Dorsalgia, unspecified: Secondary | ICD-10-CM | POA: Diagnosis not present

## 2022-06-22 DIAGNOSIS — G8929 Other chronic pain: Secondary | ICD-10-CM | POA: Diagnosis not present

## 2022-06-27 DIAGNOSIS — Z79899 Other long term (current) drug therapy: Secondary | ICD-10-CM | POA: Diagnosis not present

## 2022-07-02 DIAGNOSIS — M19041 Primary osteoarthritis, right hand: Secondary | ICD-10-CM | POA: Diagnosis not present

## 2022-07-02 DIAGNOSIS — Z79899 Other long term (current) drug therapy: Secondary | ICD-10-CM | POA: Diagnosis not present

## 2022-07-02 DIAGNOSIS — I10 Essential (primary) hypertension: Secondary | ICD-10-CM | POA: Diagnosis not present

## 2022-07-02 DIAGNOSIS — G5602 Carpal tunnel syndrome, left upper limb: Secondary | ICD-10-CM | POA: Diagnosis not present

## 2022-07-02 DIAGNOSIS — E7800 Pure hypercholesterolemia, unspecified: Secondary | ICD-10-CM | POA: Diagnosis not present

## 2022-07-02 DIAGNOSIS — Z0130 Encounter for examination of blood pressure without abnormal findings: Secondary | ICD-10-CM | POA: Diagnosis not present

## 2022-07-02 DIAGNOSIS — M549 Dorsalgia, unspecified: Secondary | ICD-10-CM | POA: Diagnosis not present

## 2022-07-02 DIAGNOSIS — M47816 Spondylosis without myelopathy or radiculopathy, lumbar region: Secondary | ICD-10-CM | POA: Diagnosis not present

## 2022-07-02 DIAGNOSIS — G8929 Other chronic pain: Secondary | ICD-10-CM | POA: Diagnosis not present

## 2022-07-02 DIAGNOSIS — M792 Neuralgia and neuritis, unspecified: Secondary | ICD-10-CM | POA: Diagnosis not present

## 2022-07-02 DIAGNOSIS — M19042 Primary osteoarthritis, left hand: Secondary | ICD-10-CM | POA: Diagnosis not present

## 2022-07-13 ENCOUNTER — Telehealth: Payer: Self-pay

## 2022-07-13 NOTE — Progress Notes (Signed)
07-13-2022: Contacted patient to reschedule telephone visit with Orlando Penner CPP 07-17-2022. Unable to leave a voicemail. Will contact patient again next week to reschedule. Appointment canceled.     [place] [facility] Pharmacist Assistant [phone]

## 2022-07-17 ENCOUNTER — Telehealth: Payer: Medicare Other

## 2022-07-24 ENCOUNTER — Telehealth: Payer: Self-pay

## 2022-07-24 NOTE — Telephone Encounter (Signed)
Patient reports since he noticed amlodipine caused swelling in legs & retained fluid. On his own accord, he decided to discontinue medication. Patient states he drinks beet juice and apple cider vinegar.

## 2022-07-29 ENCOUNTER — Other Ambulatory Visit: Payer: Self-pay | Admitting: Internal Medicine

## 2022-08-31 DIAGNOSIS — M25551 Pain in right hip: Secondary | ICD-10-CM | POA: Diagnosis not present

## 2022-08-31 DIAGNOSIS — M1711 Unilateral primary osteoarthritis, right knee: Secondary | ICD-10-CM | POA: Diagnosis not present

## 2022-09-04 DIAGNOSIS — M1611 Unilateral primary osteoarthritis, right hip: Secondary | ICD-10-CM | POA: Diagnosis not present

## 2022-09-04 DIAGNOSIS — M25551 Pain in right hip: Secondary | ICD-10-CM | POA: Diagnosis not present

## 2022-09-10 DIAGNOSIS — N261 Atrophy of kidney (terminal): Secondary | ICD-10-CM | POA: Diagnosis not present

## 2022-09-10 DIAGNOSIS — N1832 Chronic kidney disease, stage 3b: Secondary | ICD-10-CM | POA: Diagnosis not present

## 2022-09-10 DIAGNOSIS — N2889 Other specified disorders of kidney and ureter: Secondary | ICD-10-CM | POA: Diagnosis not present

## 2022-09-10 DIAGNOSIS — R809 Proteinuria, unspecified: Secondary | ICD-10-CM | POA: Diagnosis not present

## 2022-09-10 DIAGNOSIS — D631 Anemia in chronic kidney disease: Secondary | ICD-10-CM | POA: Diagnosis not present

## 2022-09-10 DIAGNOSIS — R609 Edema, unspecified: Secondary | ICD-10-CM | POA: Diagnosis not present

## 2022-09-10 DIAGNOSIS — I129 Hypertensive chronic kidney disease with stage 1 through stage 4 chronic kidney disease, or unspecified chronic kidney disease: Secondary | ICD-10-CM | POA: Diagnosis not present

## 2022-09-10 DIAGNOSIS — N2581 Secondary hyperparathyroidism of renal origin: Secondary | ICD-10-CM | POA: Diagnosis not present

## 2022-09-12 ENCOUNTER — Encounter: Payer: Self-pay | Admitting: Internal Medicine

## 2022-09-12 ENCOUNTER — Ambulatory Visit (INDEPENDENT_AMBULATORY_CARE_PROVIDER_SITE_OTHER): Payer: Medicare Other | Admitting: Internal Medicine

## 2022-09-12 VITALS — BP 130/68 | HR 60 | Temp 97.9°F | Ht <= 58 in | Wt 154.2 lb

## 2022-09-12 DIAGNOSIS — I1310 Hypertensive heart and chronic kidney disease without heart failure, with stage 1 through stage 4 chronic kidney disease, or unspecified chronic kidney disease: Secondary | ICD-10-CM

## 2022-09-12 DIAGNOSIS — M67431 Ganglion, right wrist: Secondary | ICD-10-CM

## 2022-09-12 DIAGNOSIS — I701 Atherosclerosis of renal artery: Secondary | ICD-10-CM

## 2022-09-12 DIAGNOSIS — M25831 Other specified joint disorders, right wrist: Secondary | ICD-10-CM | POA: Diagnosis not present

## 2022-09-12 DIAGNOSIS — N1831 Chronic kidney disease, stage 3a: Secondary | ICD-10-CM

## 2022-09-12 DIAGNOSIS — Z23 Encounter for immunization: Secondary | ICD-10-CM

## 2022-09-12 NOTE — Progress Notes (Signed)
Barnet [place] Martin,acting as a Education administrator for [name], MD.,have documented all relevant documentation on the behalf of [name], MD,as directed by  [name], MD while in the presence of [name], MD.    Subjective:     Patient ID: Wayne [name] , male    DOB: 12-Mar-1926 , 88 y.o.   MRN: [id number]   Chief Complaint  Patient presents with   Hypertension    HPI  Patient presents today for a bp and chol check. He reports compliance with meds. Denies headaches, chest pain and shortness of breath. Patient states his right hip is still hurting him. He has h/o mod-severe osteoarthritis.   BP Readings from Last 3 Encounters: 09/12/22 : (!) 140/72 05/16/22 : 110/60 02/26/22 : 120/70     Hypertension This is a chronic problem. The current episode started more than 1 year ago. The problem has been gradually improving since onset. The problem is controlled. Pertinent negatives include no palpitations or shortness of breath. Past treatments include calcium channel blockers, angiotensin blockers, diuretics and lifestyle changes. The current treatment provides moderate improvement. Hypertensive end-organ damage includes kidney disease.     Past Medical History:  Diagnosis Date   Arthritis    Cataract    Glaucoma    Hypertension      Family History  Problem Relation Age of Onset   Dementia Mother    Cancer Father      Current Outpatient Medications:    amLODipine (NORVASC) 5 MG tablet, TAKE 1 TABLET BY MOUTH  DAILY, Disp: 90 tablet, Rfl: 3   aspirin EC 81 MG tablet, Take 81 mg by mouth daily., Disp: , Rfl:    atorvastatin (LIPITOR) 20 MG tablet, TAKE M,W,F ONLY., Disp: 90 tablet, Rfl: 1   B Complex-C (B-COMPLEX WITH VITAMIN C) tablet, Take 1 tablet by mouth daily., Disp: , Rfl:    Cholecalciferol (VITAMIN D3) 125 MCG (5000 UT) CAPS, Take by mouth daily. Monday, Wednesday, Friday, Disp: , Rfl:    diclofenac Sodium (VOLTAREN) 1 % GEL, Apply 2 g topically 4  (four) times daily., Disp: 150 g, Rfl: 1   fluocinonide cream (LIDEX) 7.82 %, Apply 1 application. topically 2 (two) times daily., Disp: 120 g, Rfl: 2   furosemide (LASIX) 40 MG tablet, Take 40 mg by mouth daily., Disp: , Rfl:    gabapentin (NEURONTIN) 300 MG capsule, TAKE 1 CAPSULE BY MOUTH TWICE  DAILY, Disp: 200 capsule, Rfl: 2   ketoconazole (NIZORAL) 2 % cream, Apply 1 application topically daily., Disp: 15 g, Rfl: 1   loratadine (CLARITIN) 10 MG tablet, Take 1 tablet (10 mg total) by mouth daily., Disp: 90 tablet, Rfl: 2   Multiple Vitamin (MULTIVITAMIN WITH MINERALS) TABS tablet, Take 1 tablet by mouth daily., Disp: , Rfl:    NON FORMULARY, CBD gummies, Disp: , Rfl:    Omega-3 Fatty Acids (FISH OIL) 1200 MG CAPS, Take 1 capsule by mouth daily. , Disp: , Rfl:    oxyCODONE-acetaminophen (PERCOCET/ROXICET) 5-325 MG tablet, Take 1-2 tablets by mouth daily as needed for pain. , Disp: , Rfl: 0   Saw Palmetto 450 MG CAPS, Take 2 capsules by mouth daily., Disp: , Rfl:    senna (SENOKOT) 8.6 MG tablet, Take 1 tablet by mouth daily., Disp: , Rfl:    tamsulosin (FLOMAX) 0.4 MG CAPS capsule, Take 0.4 mg by mouth daily. , Disp: , Rfl:    timolol (BETIMOL) 0.5 % ophthalmic solution, Place 1 drop  into both eyes daily. , Disp: , Rfl:    valsartan-hydrochlorothiazide (DIOVAN-HCT) 80-12.5 MG tablet, Take 1 tablet by mouth daily., Disp: 90 tablet, Rfl: 2   Allergies  Allergen Reactions   Cephalexin     Severe mouth and throat dryness.     Review of Systems  Constitutional: Negative.   HENT: Negative.    Eyes: Negative.   Respiratory: Negative.  Negative for shortness of breath.   Cardiovascular: Negative.  Negative for palpitations.  Gastrointestinal: Negative.   Musculoskeletal:  Positive for arthralgias.       He c/o right hip pain. He was seen by Ortho, Dr. Rhona Raider. He was advised joint is "bone on bone". He was given steroid injection, with some relief.      Today's Vitals   09/12/22 1142  09/12/22 1155  BP: (!) 140/72 130/68  Pulse: 60   Temp: 97.9 F (36.6 C)   TempSrc: Oral   Weight: 154 lb 3.2 oz (69.9 kg)   Height: '4\' 9"'$  (1.448 m)   PainSc: 7     Body mass index is 33.37 kg/m.  Wt Readings from Last 3 Encounters:  09/12/22 154 lb 3.2 oz (69.9 kg)  05/16/22 155 lb 9.6 oz (70.6 kg)  02/26/22 159 lb 6.4 oz (72.3 kg)    Objective:  Physical Exam Vitals and nursing note reviewed.  Constitutional:      Appearance: Normal appearance.  HENT:     Head: Normocephalic and atraumatic.     Nose:     Comments: Masked     Mouth/Throat:     Comments: Masked  Eyes:     Extraocular Movements: Extraocular movements intact.  Cardiovascular:     Rate and Rhythm: Normal rate and regular rhythm.     Heart sounds: Normal heart sounds.  Pulmonary:     Effort: Pulmonary effort is normal.     Breath sounds: Normal breath sounds.  Musculoskeletal:        General: Swelling present.     Cervical back: Normal range of motion.     Comments: Ambulatory with walker Multiple joint deformities of hands  Well circumscribed round mass on dorsum of right wrist  Skin:    General: Skin is warm.  Neurological:     General: No focal deficit present.     Mental Status: He is alert.  Psychiatric:        Mood and Affect: Mood normal.      Assessment And Plan:     1. Hypertensive heart and renal disease with renal failure, stage 1 through stage 4 or unspecified chronic kidney disease, without heart failure Comments: Chronic, controlled. He will c/w furosemide '40mg'$ , amloidpine '5mg'$  and valsartan/hct 80/12.'5mg'$  daily. Advised to follow low sodium diet.   2. Atherosclerosis of renal artery (HCC) Comments: Chronic, currently on statin therapy. LDL goal <70.   3. Stage 3a chronic kidney disease ([place]) Comments: Chronic, adivsed to avoid NSAIDs, stay hydrated and keep BP well controlled to decrease risk of worsening CKD.   4. Mass of joint of right wrist Comments: Right wrist mass is  suggestive of ganglion cyst. I will refer him to Hand specialist if pain develops.  5. Need for influenza vaccination - Flu Vaccine QUAD High Dose(Fluad)   Patient was given opportunity to ask questions. Patient verbalized understanding of the plan and was able to repeat key elements of the plan. All questions were answered to their satisfaction.   I, [name], MD, have reviewed all documentation for  this visit. The documentation on 09/12/22 for the exam, diagnosis, procedures, and orders are all accurate and complete.   IF YOU HAVE BEEN REFERRED TO A SPECIALIST, IT MAY TAKE 1-2 WEEKS TO SCHEDULE/PROCESS THE REFERRAL. IF YOU HAVE NOT HEARD FROM US/SPECIALIST IN TWO WEEKS, PLEASE GIVE [place] A CALL AT [phone] X [phone].   THE PATIENT IS ENCOURAGED TO PRACTICE SOCIAL DISTANCING DUE TO THE COVID-19 PANDEMIC.

## 2022-09-12 NOTE — Patient Instructions (Signed)
Hypertension, Adult ?Hypertension is another name for high blood pressure. High blood pressure forces your heart to work harder to pump blood. This can cause problems over time. ?There are two numbers in a blood pressure reading. There is a top number (systolic) over a bottom number (diastolic). It is best to have a blood pressure that is below 120/80. ?What are the causes? ?The cause of this condition is not known. Some other conditions can lead to high blood pressure. ?What increases the risk? ?Some lifestyle factors can make you more likely to develop high blood pressure: ?Smoking. ?Not getting enough exercise or physical activity. ?Being overweight. ?Having too much fat, sugar, calories, or salt (sodium) in your diet. ?Drinking too much alcohol. ?Other risk factors include: ?Having any of these conditions: ?Heart disease. ?Diabetes. ?High cholesterol. ?Kidney disease. ?Obstructive sleep apnea. ?Having a family history of high blood pressure and high cholesterol. ?Age. The risk increases with age. ?Stress. ?What are the signs or symptoms? ?High blood pressure may not cause symptoms. Very high blood pressure (hypertensive crisis) may cause: ?Headache. ?Fast or uneven heartbeats (palpitations). ?Shortness of breath. ?Nosebleed. ?Vomiting or feeling like you may vomit (nauseous). ?Changes in how you see. ?Very bad chest pain. ?Feeling dizzy. ?Seizures. ?How is this treated? ?This condition is treated by making healthy lifestyle changes, such as: ?Eating healthy foods. ?Exercising more. ?Drinking less alcohol. ?Your doctor may prescribe medicine if lifestyle changes do not help enough and if: ?Your top number is above 130. ?Your bottom number is above 80. ?Your personal target blood pressure may vary. ?Follow these instructions at home: ?Eating and drinking ? ?If told, follow the DASH eating plan. To follow this plan: ?Fill one half of your plate at each meal with fruits and vegetables. ?Fill one fourth of your plate  at each meal with whole grains. Whole grains include whole-wheat pasta, brown rice, and whole-grain bread. ?Eat or drink low-fat dairy products, such as skim milk or low-fat yogurt. ?Fill one fourth of your plate at each meal with low-fat (lean) proteins. Low-fat proteins include fish, chicken without skin, eggs, beans, and tofu. ?Avoid fatty meat, cured and processed meat, or chicken with skin. ?Avoid pre-made or processed food. ?Limit the amount of salt in your diet to less than 1,500 mg each day. ?Do not drink alcohol if: ?Your doctor tells you not to drink. ?You are pregnant, may be pregnant, or are planning to become pregnant. ?If you drink alcohol: ?Limit how much you have to: ?0-1 drink a day for women. ?0-2 drinks a day for men. ?Know how much alcohol is in your drink. In the [place] one drink equals one 12 oz bottle of beer (355 mL), one 5 oz glass of wine (148 mL), or one 1? oz glass of hard liquor (44 mL). ?Lifestyle ? ?Work with your doctor to stay at a healthy weight or to lose weight. Ask your doctor what the best weight is for you. ?Get at least 30 minutes of exercise that causes your heart to beat faster (aerobic exercise) most days of the week. This may include walking, swimming, or biking. ?Get at least 30 minutes of exercise that strengthens your muscles (resistance exercise) at least 3 days a week. This may include lifting weights or doing Pilates. ?Do not smoke or use any products that contain nicotine or tobacco. If you need help quitting, ask your doctor. ?Check your blood pressure at home as told by your doctor. ?Keep all follow-up visits. ?Medicines ?Take over-the-counter and prescription medicines   only as told by your doctor. Follow directions carefully. ?Do not skip doses of blood pressure medicine. The medicine does not work as well if you skip doses. Skipping doses also puts you at risk for problems. ?Ask your doctor about side effects or reactions to medicines that you should watch  for. ?Contact a doctor if: ?You think you are having a reaction to the medicine you are taking. ?You have headaches that keep coming back. ?You feel dizzy. ?You have swelling in your ankles. ?You have trouble with your vision. ?Get help right away if: ?You get a very bad headache. ?You start to feel mixed up (confused). ?You feel weak or numb. ?You feel faint. ?You have very bad pain in your: ?Chest. ?Belly (abdomen). ?You vomit more than once. ?You have trouble breathing. ?These symptoms may be an emergency. Get help right away. Call 911. ?Do not wait to see if the symptoms will go away. ?Do not drive yourself to the hospital. ?Summary ?Hypertension is another name for high blood pressure. ?High blood pressure forces your heart to work harder to pump blood. ?For most people, a normal blood pressure is less than 120/80. ?Making healthy choices can help lower blood pressure. If your blood pressure does not get lower with healthy choices, you may need to take medicine. ?This information is not intended to replace advice given to you by your health care provider. Make sure you discuss any questions you have with your health care provider. ?Document Revised: 09/07/2021 Document Reviewed: 09/07/2021 ?Elsevier Patient Education ? 2023 Elsevier Inc. ? ?

## 2022-09-17 ENCOUNTER — Ambulatory Visit: Payer: Medicare Other | Admitting: Internal Medicine

## 2022-09-17 DIAGNOSIS — R262 Difficulty in walking, not elsewhere classified: Secondary | ICD-10-CM | POA: Diagnosis not present

## 2022-09-17 DIAGNOSIS — R2689 Other abnormalities of gait and mobility: Secondary | ICD-10-CM | POA: Diagnosis not present

## 2022-09-17 DIAGNOSIS — R2681 Unsteadiness on feet: Secondary | ICD-10-CM | POA: Diagnosis not present

## 2022-09-21 DIAGNOSIS — M1711 Unilateral primary osteoarthritis, right knee: Secondary | ICD-10-CM | POA: Diagnosis not present

## 2022-09-28 DIAGNOSIS — M1711 Unilateral primary osteoarthritis, right knee: Secondary | ICD-10-CM | POA: Diagnosis not present

## 2022-10-05 DIAGNOSIS — M1711 Unilateral primary osteoarthritis, right knee: Secondary | ICD-10-CM | POA: Diagnosis not present

## 2022-10-16 DIAGNOSIS — N1832 Chronic kidney disease, stage 3b: Secondary | ICD-10-CM | POA: Diagnosis not present

## 2022-10-23 ENCOUNTER — Encounter: Payer: Self-pay | Admitting: Internal Medicine

## 2022-11-21 DIAGNOSIS — M25522 Pain in left elbow: Secondary | ICD-10-CM | POA: Diagnosis not present

## 2022-12-11 ENCOUNTER — Other Ambulatory Visit: Payer: Self-pay | Admitting: Urology

## 2022-12-11 DIAGNOSIS — N281 Cyst of kidney, acquired: Secondary | ICD-10-CM

## 2022-12-26 ENCOUNTER — Encounter: Payer: Self-pay | Admitting: Internal Medicine

## 2022-12-26 ENCOUNTER — Ambulatory Visit (INDEPENDENT_AMBULATORY_CARE_PROVIDER_SITE_OTHER): Payer: Medicare Other | Admitting: Internal Medicine

## 2022-12-26 VITALS — BP 130/78 | HR 75 | Temp 98.7°F | Ht <= 58 in | Wt 157.0 lb

## 2022-12-26 DIAGNOSIS — N1831 Chronic kidney disease, stage 3a: Secondary | ICD-10-CM | POA: Diagnosis not present

## 2022-12-26 DIAGNOSIS — R1900 Intra-abdominal and pelvic swelling, mass and lump, unspecified site: Secondary | ICD-10-CM | POA: Diagnosis not present

## 2022-12-26 DIAGNOSIS — N281 Cyst of kidney, acquired: Secondary | ICD-10-CM

## 2022-12-26 DIAGNOSIS — I701 Atherosclerosis of renal artery: Secondary | ICD-10-CM

## 2022-12-26 DIAGNOSIS — I1310 Hypertensive heart and chronic kidney disease without heart failure, with stage 1 through stage 4 chronic kidney disease, or unspecified chronic kidney disease: Secondary | ICD-10-CM | POA: Diagnosis not present

## 2022-12-26 DIAGNOSIS — Z6833 Body mass index (BMI) 33.0-33.9, adult: Secondary | ICD-10-CM

## 2022-12-26 DIAGNOSIS — M25831 Other specified joint disorders, right wrist: Secondary | ICD-10-CM

## 2022-12-26 DIAGNOSIS — E6609 Other obesity due to excess calories: Secondary | ICD-10-CM

## 2022-12-26 NOTE — Progress Notes (Unsigned)
Rich Brave Ll[place]leton,acting as a Education administrator for [name], MD.,have documented all relevant documentation on the behalf of [name], MD,as directed by  [name], MD while in the presence of [name], MD.    Subjective:     Patient ID: Wayne [name] , male    DOB: 1926/09/08 , 89 y.o.   MRN: [id number]   Chief Complaint  Patient presents with   Hypertension    HPI  Patient presents today for a bp and chol check. He reports compliance with meds. Denies headaches, chest pain and shortness of breath. Patient has a cyst on his hand he would like for you to refer him to someone about that.   Hypertension This is a chronic problem. The current episode started more than 1 year ago. The problem has been gradually improving since onset. The problem is controlled. Past treatments include calcium channel blockers, angiotensin blockers, diuretics and lifestyle changes. The current treatment provides moderate improvement. Hypertensive end-organ damage includes kidney disease.     Past Medical History:  Diagnosis Date   Arthritis    Cataract    Glaucoma    Hypertension      Family History  Problem Relation Age of Onset   Dementia Mother    Cancer Father      Current Outpatient Medications:    amLODipine (NORVASC) 5 MG tablet, TAKE 1 TABLET BY MOUTH  DAILY, Disp: 90 tablet, Rfl: 3   aspirin EC 81 MG tablet, Take 81 mg by mouth daily., Disp: , Rfl:    atorvastatin (LIPITOR) 20 MG tablet, TAKE M,W,F ONLY., Disp: 90 tablet, Rfl: 1   B Complex-C (B-COMPLEX WITH VITAMIN C) tablet, Take 1 tablet by mouth daily., Disp: , Rfl:    Cholecalciferol (VITAMIN D3) 125 MCG (5000 UT) CAPS, Take by mouth daily. Monday, Wednesday, Friday, Disp: , Rfl:    diclofenac Sodium (VOLTAREN) 1 % GEL, Apply 2 g topically 4 (four) times daily., Disp: 150 g, Rfl: 1   fluocinonide cream (LIDEX) 0.92 %, Apply 1 application. topically 2 (two) times daily., Disp: 120 g, Rfl: 2   furosemide  (LASIX) 40 MG tablet, Take 40 mg by mouth daily., Disp: , Rfl:    gabapentin (NEURONTIN) 300 MG capsule, TAKE 1 CAPSULE BY MOUTH TWICE  DAILY, Disp: 200 capsule, Rfl: 2   ketoconazole (NIZORAL) 2 % cream, Apply 1 application topically daily., Disp: 15 g, Rfl: 1   loratadine (CLARITIN) 10 MG tablet, Take 1 tablet (10 mg total) by mouth daily., Disp: 90 tablet, Rfl: 2   Multiple Vitamin (MULTIVITAMIN WITH MINERALS) TABS tablet, Take 1 tablet by mouth daily., Disp: , Rfl:    NON FORMULARY, CBD gummies, Disp: , Rfl:    Omega-3 Fatty Acids (FISH OIL) 1200 MG CAPS, Take 1 capsule by mouth daily. , Disp: , Rfl:    oxyCODONE-acetaminophen (PERCOCET/ROXICET) 5-325 MG tablet, Take 1-2 tablets by mouth daily as needed for pain. , Disp: , Rfl: 0   Saw Palmetto 450 MG CAPS, Take 2 capsules by mouth daily., Disp: , Rfl:    senna (SENOKOT) 8.6 MG tablet, Take 1 tablet by mouth daily., Disp: , Rfl:    tamsulosin (FLOMAX) 0.4 MG CAPS capsule, Take 0.4 mg by mouth daily. , Disp: , Rfl:    timolol (BETIMOL) 0.5 % ophthalmic solution, Place 1 drop into both eyes daily. , Disp: , Rfl:    valsartan-hydrochlorothiazide (DIOVAN-HCT) 80-12.5 MG tablet, Take 1 tablet by mouth daily., Disp: 90  tablet, Rfl: 2   Allergies  Allergen Reactions   Cephalexin     Severe mouth and throat dryness.     Review of Systems  Constitutional: Negative.   Eyes: Negative.   Respiratory: Negative.    Cardiovascular: Negative.   Gastrointestinal: Negative.   Genitourinary:  Positive for genital sores.  Musculoskeletal: Negative.   Skin: Negative.   Neurological: Negative.   Psychiatric/Behavioral: Negative.       Today's Vitals   12/26/22 1143  BP: 130/78  Pulse: 75  Temp: 98.7 F (37.1 C)  Weight: 157 lb (71.2 kg)  Height: '4\' 9"'$  (1.448 m)   Body mass index is 33.97 kg/m.  Wt Readings from Last 3 Encounters:  12/26/22 157 lb (71.2 kg)  09/12/22 154 lb 3.2 oz (69.9 kg)  05/16/22 155 lb 9.6 oz (70.6 kg)    BP  Readings from Last 3 Encounters:  12/26/22 130/78  09/12/22 130/68  05/16/22 110/60     Objective:  Physical Exam Vitals and nursing note reviewed.  Constitutional:      Appearance: Normal appearance.  HENT:     Head: Normocephalic and atraumatic.     Nose:     Comments: Masked     Mouth/Throat:     Comments: Masked  Eyes:     Extraocular Movements: Extraocular movements intact.  Cardiovascular:     Rate and Rhythm: Normal rate and regular rhythm.     Heart sounds: Normal heart sounds.  Pulmonary:     Effort: Pulmonary effort is normal.     Breath sounds: Normal breath sounds.  Genitourinary:    Comments: Mass left groin Musculoskeletal:     Cervical back: Normal range of motion.     Comments: Arthritic changes b/l hands R wrist mass  Skin:    General: Skin is warm.  Neurological:     General: No focal deficit present.     Mental Status: He is alert.  Psychiatric:        Mood and Affect: Mood normal.       Assessment And Plan:     1. Hypertensive heart and renal disease with renal failure, stage 1 through stage 4 or unspecified chronic kidney disease, without heart failure Comments: Chronic, controlled. He will c/w amlodipine '5mg'$ , valsartan 80/12.'5mg'$  and furosemide. Encouraged to follow low sodium diet.  2. Atherosclerosis of renal artery (HCC) Comments: Chronic, LDL goal <70. He is encouraged to continue with ASA and atorvastatin.  3. Stage 3a chronic kidney disease ([place]) Comments: No labs drawn, he has upcoming appt with Nephrology on 01/11/23.  Reminded to avoid NSAIDs.  4. Mass of joint of right wrist - Ambulatory referral to Hand Surgery  5. Pelvic mass in male Comments: I will refer him for CT abd/pelvix for further evaluation. - CT ABDOMEN PELVIS W CONTRAST; Future  6. Class 1 obesity due to excess calories with serious comorbidity and body mass index (BMI) of 33.0 to 33.9 in adult Comments: He is encouraged to increase his daily activity to help him  achieve BMI<30 to decrease cardiac risk. He is reminded to consider chair exercises.     Patient was given opportunity to ask questions. Patient verbalized understanding of the plan and was able to repeat key elements of the plan. All questions were answered to their satisfaction.   I, [name], MD, have reviewed all documentation for this visit. The documentation on 12/26/22 for the exam, diagnosis, procedures, and orders are all accurate and complete.   IF  YOU HAVE BEEN REFERRED TO A SPECIALIST, IT MAY TAKE 1-2 WEEKS TO SCHEDULE/PROCESS THE REFERRAL. IF YOU HAVE NOT HEARD FROM US/SPECIALIST IN TWO WEEKS, PLEASE GIVE [place] A CALL AT [phone].   THE PATIENT IS ENCOURAGED TO PRACTICE SOCIAL DISTANCING DUE TO THE COVID-19 PANDEMIC.

## 2022-12-26 NOTE — Patient Instructions (Signed)
Hypertension, Adult Hypertension is another name for high blood pressure. High blood pressure forces your heart to work harder to pump blood. This can cause problems over time. There are two numbers in a blood pressure reading. There is a top number (systolic) over a bottom number (diastolic). It is best to have a blood pressure that is below 120/80. What are the causes? The cause of this condition is not known. Some other conditions can lead to high blood pressure. What increases the risk? Some lifestyle factors can make you more likely to develop high blood pressure: Smoking. Not getting enough exercise or physical activity. Being overweight. Having too much fat, sugar, calories, or salt (sodium) in your diet. Drinking too much alcohol. Other risk factors include: Having any of these conditions: Heart disease. Diabetes. High cholesterol. Kidney disease. Obstructive sleep apnea. Having a family history of high blood pressure and high cholesterol. Age. The risk increases with age. Stress. What are the signs or symptoms? High blood pressure may not cause symptoms. Very high blood pressure (hypertensive crisis) may cause: Headache. Fast or uneven heartbeats (palpitations). Shortness of breath. Nosebleed. Vomiting or feeling like you may vomit (nauseous). Changes in how you see. Very bad chest pain. Feeling dizzy. Seizures. How is this treated? This condition is treated by making healthy lifestyle changes, such as: Eating healthy foods. Exercising more. Drinking less alcohol. Your doctor may prescribe medicine if lifestyle changes do not help enough and if: Your top number is above 130. Your bottom number is above 80. Your personal target blood pressure may vary. Follow these instructions at home: Eating and drinking  If told, follow the DASH eating plan. To follow this plan: Fill one half of your plate at each meal with fruits and vegetables. Fill one fourth of your plate  at each meal with whole grains. Whole grains include whole-wheat pasta, brown rice, and whole-grain bread. Eat or drink low-fat dairy products, such as skim milk or low-fat yogurt. Fill one fourth of your plate at each meal with low-fat (lean) proteins. Low-fat proteins include fish, chicken without skin, eggs, beans, and tofu. Avoid fatty meat, cured and processed meat, or chicken with skin. Avoid pre-made or processed food. Limit the amount of salt in your diet to less than 1,500 mg each day. Do not drink alcohol if: Your doctor tells you not to drink. You are pregnant, may be pregnant, or are planning to become pregnant. If you drink alcohol: Limit how much you have to: 0-1 drink a day for women. 0-2 drinks a day for men. Know how much alcohol is in your drink. In the [place] one drink equals one 12 oz bottle of beer (355 mL), one 5 oz glass of wine (148 mL), or one 1 oz glass of hard liquor (44 mL). Lifestyle  Work with your doctor to stay at a healthy weight or to lose weight. Ask your doctor what the best weight is for you. Get at least 30 minutes of exercise that causes your heart to beat faster (aerobic exercise) most days of the week. This may include walking, swimming, or biking. Get at least 30 minutes of exercise that strengthens your muscles (resistance exercise) at least 3 days a week. This may include lifting weights or doing Pilates. Do not smoke or use any products that contain nicotine or tobacco. If you need help quitting, ask your doctor. Check your blood pressure at home as told by your doctor. Keep all follow-up visits. Medicines Take over-the-counter and prescription medicines  only as told by your doctor. Follow directions carefully. ?Do not skip doses of blood pressure medicine. The medicine does not work as well if you skip doses. Skipping doses also puts you at risk for problems. ?Ask your doctor about side effects or reactions to medicines that you should watch  for. ?Contact a doctor if: ?You think you are having a reaction to the medicine you are taking. ?You have headaches that keep coming back. ?You feel dizzy. ?You have swelling in your ankles. ?You have trouble with your vision. ?Get help right away if: ?You get a very bad headache. ?You start to feel mixed up (confused). ?You feel weak or numb. ?You feel faint. ?You have very bad pain in your: ?Chest. ?Belly (abdomen). ?You vomit more than once. ?You have trouble breathing. ?These symptoms may be an emergency. Get help right away. Call 911. ?Do not wait to see if the symptoms will go away. ?Do not drive yourself to the hospital. ?Summary ?Hypertension is another name for high blood pressure. ?High blood pressure forces your heart to work harder to pump blood. ?For most people, a normal blood pressure is less than 120/80. ?Making healthy choices can help lower blood pressure. If your blood pressure does not get lower with healthy choices, you may need to take medicine. ?This information is not intended to replace advice given to you by your health care provider. Make sure you discuss any questions you have with your health care provider. ?Document Revised: 09/07/2021 Document Reviewed: 09/07/2021 ?[employer]vier Patient Education ? 2023 [employer] ? ?

## 2023-01-11 DIAGNOSIS — R809 Proteinuria, unspecified: Secondary | ICD-10-CM | POA: Diagnosis not present

## 2023-01-11 DIAGNOSIS — I129 Hypertensive chronic kidney disease with stage 1 through stage 4 chronic kidney disease, or unspecified chronic kidney disease: Secondary | ICD-10-CM | POA: Diagnosis not present

## 2023-01-11 DIAGNOSIS — N1832 Chronic kidney disease, stage 3b: Secondary | ICD-10-CM | POA: Diagnosis not present

## 2023-01-11 DIAGNOSIS — D631 Anemia in chronic kidney disease: Secondary | ICD-10-CM | POA: Diagnosis not present

## 2023-01-11 DIAGNOSIS — R609 Edema, unspecified: Secondary | ICD-10-CM | POA: Diagnosis not present

## 2023-01-11 DIAGNOSIS — N2581 Secondary hyperparathyroidism of renal origin: Secondary | ICD-10-CM | POA: Diagnosis not present

## 2023-01-11 DIAGNOSIS — N261 Atrophy of kidney (terminal): Secondary | ICD-10-CM | POA: Diagnosis not present

## 2023-01-11 DIAGNOSIS — N2889 Other specified disorders of kidney and ureter: Secondary | ICD-10-CM | POA: Diagnosis not present

## 2023-01-15 ENCOUNTER — Other Ambulatory Visit: Payer: Self-pay | Admitting: Internal Medicine

## 2023-01-17 DIAGNOSIS — M25831 Other specified joint disorders, right wrist: Secondary | ICD-10-CM | POA: Diagnosis not present

## 2023-01-30 ENCOUNTER — Ambulatory Visit (INDEPENDENT_AMBULATORY_CARE_PROVIDER_SITE_OTHER): Payer: Medicare Other

## 2023-01-30 ENCOUNTER — Ambulatory Visit: Payer: Medicare Other | Admitting: Internal Medicine

## 2023-01-30 VITALS — Ht 63.5 in | Wt 152.0 lb

## 2023-01-30 DIAGNOSIS — Z0000 Encounter for general adult medical examination without abnormal findings: Secondary | ICD-10-CM | POA: Diagnosis not present

## 2023-01-30 NOTE — Progress Notes (Signed)
I connected with  Wayne [name] on 01/30/23 by a audio enabled telemedicine application and verified that I am speaking with the correct person using two identifiers.  Patient Location: Home  Provider Location: Office/Clinic  I discussed the limitations of evaluation and management by telemedicine. The patient expressed understanding and agreed to proceed.  Subjective:   Wayne [name] is a 89 y.o. male who presents for Medicare Annual/Subsequent preventive examination.  Review of Systems     Cardiac Risk Factors include: advanced age (>8mn, >>61women);dyslipidemia;hypertension;male gender     Objective:    Today's Vitals   01/30/23 1406 01/30/23 1407  Weight: 152 lb (68.9 kg)   Height: 5' 3.5" (1.613 m)   PainSc:  5    Body mass index is 26.5 kg/m.     01/30/2023    2:13 PM 01/11/2022    2:20 PM 01/04/2021    2:04 PM 12/30/2019    2:23 PM 12/25/2018    9:47 AM 08/07/2018    9:28 AM 08/10/2017   12:18 PM  Advanced Directives  Does Patient Have a Medical Advance Directive? Yes Yes Yes Yes Yes No Yes  Type of AParamedicof AStanleyLiving will HSlating[place]Living will HHavre de GraceLiving will Living will HMarvellLiving will  Living will  Does patient want to make changes to medical advance directive?     No - Patient declined    Copy of HIraanin Chart? No - copy requested No - copy requested No - copy requested  No - copy requested    Would patient like information on creating a medical advance directive?      No - Patient declined     Current Medications (verified) Outpatient Encounter Medications as of 01/30/2023  Medication Sig   aspirin EC 81 MG tablet Take 81 mg by mouth daily.   atorvastatin (LIPITOR) 20 MG tablet TAKE M,W,F ONLY.   B Complex-C (B-COMPLEX WITH VITAMIN C) tablet Take 1 tablet by mouth daily.   Cholecalciferol (VITAMIN D3) 125 MCG (5000 UT) CAPS Take by  mouth daily. Monday, Wednesday, Friday   fluocinonide cream (LIDEX) 0AB-123456789% Apply 1 application. topically 2 (two) times daily.   furosemide (LASIX) 40 MG tablet Take 40 mg by mouth daily.   gabapentin (NEURONTIN) 300 MG capsule TAKE 1 CAPSULE BY MOUTH TWICE  DAILY   Multiple Vitamin (MULTIVITAMIN WITH MINERALS) TABS tablet Take 1 tablet by mouth daily.   Omega-3 Fatty Acids (FISH OIL) 1200 MG CAPS Take 1 capsule by mouth daily.    oxyCODONE-acetaminophen (PERCOCET/ROXICET) 5-325 MG tablet Take 1-2 tablets by mouth daily as needed for pain.    Saw Palmetto 450 MG CAPS Take 2 capsules by mouth daily.   tamsulosin (FLOMAX) 0.4 MG CAPS capsule Take 0.4 mg by mouth daily.    timolol (BETIMOL) 0.5 % ophthalmic solution Place 1 drop into both eyes daily.    valsartan-hydrochlorothiazide (DIOVAN-HCT) 80-12.5 MG tablet Take 1 tablet by mouth daily.   amLODipine (NORVASC) 5 MG tablet TAKE 1 TABLET BY MOUTH DAILY (Patient not taking: Reported on 01/30/2023)   diclofenac Sodium (VOLTAREN) 1 % GEL Apply 2 g topically 4 (four) times daily. (Patient not taking: Reported on 01/30/2023)   ketoconazole (NIZORAL) 2 % cream Apply 1 application topically daily. (Patient not taking: Reported on 01/30/2023)   loratadine (CLARITIN) 10 MG tablet Take 1 tablet (10 mg total) by mouth daily.   NON FORMULARY CBD gummies (  Patient not taking: Reported on 01/30/2023)   senna (SENOKOT) 8.6 MG tablet Take 1 tablet by mouth daily. (Patient not taking: Reported on 01/30/2023)   No facility-administered encounter medications on file as of 01/30/2023.    Allergies (verified) Cephalexin   History: Past Medical History:  Diagnosis Date   Arthritis    Cataract    Glaucoma    Hypertension    Past Surgical History:  Procedure Laterality Date   APPENDECTOMY     BACK SURGERY     ballon angioplasty  03/18/2012   FOOT SURGERY     JOINT REPLACEMENT     kidney stent Left 2015   KNEE SURGERY Left x 2    left ankle     LEFT HEART  CATHETERIZATION WITH CORONARY ANGIOGRAM N/A 03/18/2012   Procedure: LEFT HEART CATHETERIZATION WITH CORONARY ANGIOGRAM;  Surgeon: Laverda Page, MD;  [facility] CATH LAB;  Service: Cardiovascular;  Laterality: N/A;   RENAL ANGIOGRAM N/A 03/18/2012   Procedure: RENAL ANGIOGRAM;  Surgeon: Laverda Page, MD;  Location: [facility] CATH LAB;  Service: Cardiovascular;  Laterality: N/A;   right wrist     SPINE SURGERY     Family History  Problem Relation Age of Onset   Dementia Mother    Cancer Father    Social History   Socioeconomic History   Marital status: Widowed    Spouse name: Not on file   Number of children: 2   Years of education: 12.5   Highest education level: Not on file  Occupational History   Occupation: retired    Comment: [employer]  Tobacco Use   Smoking status: Former    Packs/day: 0.25    Years: 3.00    Total pack years: 0.75    Types: Cigarettes    Quit date: 06/02/1972    Years since quitting: 50.6   Smokeless tobacco: Never  Vaping Use   Vaping Use: Never used  Substance and Sexual Activity   Alcohol use: Not Currently   Drug use: Yes    Types: Oxycodone   Sexual activity: Not Currently  Other Topics Concern   Not on file  Social History Narrative   Lives at home by himself   Caffeine use- none   Social Determinants of Health   Financial Resource Strain: Low Risk  (01/30/2023)   Overall Financial Resource Strain (CARDIA)    Difficulty of Paying Living Expenses: Not hard at all  Food Insecurity: No Food Insecurity (01/30/2023)   Hunger Vital Sign    Worried About Running Out of Food in the Last Year: Never true    [place] in the Last Year: Never true  Transportation Needs: No Transportation Needs (01/30/2023)   PRAPARE - Hydrologist (Medical): No    Lack of Transportation (Non-Medical): No  Physical Activity: Inactive (01/30/2023)   Exercise Vital Sign    Days of Exercise per Week: 0 days    Minutes of Exercise  per Session: 0 min  Stress: No Stress Concern Present (01/30/2023)   [place]    Feeling of Stress : Not at all  Social Connections: Not on file    Tobacco Counseling Counseling given: Not Answered   Clinical Intake:  Pre-visit preparation completed: Yes  Pain : 0-10 Pain Score: 5  Pain Type: Chronic pain Pain Location: Foot Pain Orientation: Left, Right Pain Descriptors / Indicators: Aching Pain Onset: More than a month ago  Pain Frequency: Constant     Nutritional Status: BMI 25 -29 Overweight Nutritional Risks: None Diabetes: No  How often do you need to have someone help you when you read instructions, pamphlets, or other written materials from your doctor or pharmacy?: 1 - Never  Diabetic? no  Interpreter Needed?: No  Information entered by :: NAllen LPN   Activities of Daily Living    01/30/2023    2:14 PM  In your present state of health, do you have any difficulty performing the following activities:  Hearing? 0  Comment has hearing aid  Vision? 1  Comment blurry sometimes  Difficulty concentrating or making decisions? 0  Walking or climbing stairs? 1  Dressing or bathing? 1  Comment takes awhile  Doing errands, shopping? 0  Preparing Food and eating ? N  Using the Toilet? N  In the past six months, have you accidently leaked urine? Y  Do you have problems with loss of bowel control? N  Managing your Medications? N  Managing your Finances? N  Housekeeping or managing your Housekeeping? Y    Patient Care Team: Glendale Chard, MD as PCP - General (Internal Medicine) Warden Fillers, MD as Consulting Physician (Ophthalmology) Mayford Knife, [facility] (Pharmacist)  Indicate any recent Medical Services you may have received from other than [facility] providers in the past year (date may be approximate).     Assessment:   This is a routine wellness examination for  Kirsten.  Hearing/Vision screen Vision Screening - Comments:: Regular eye exams, [facility]  Dietary issues and exercise activities discussed: Current Exercise Habits: The patient does not participate in regular exercise at present   Goals Addressed             This Visit's Progress    Patient Stated       01/30/2023, no goals       Depression Screen    01/30/2023    2:14 PM 09/12/2022   11:40 AM 01/11/2022    2:22 PM 01/04/2021    2:08 PM 12/30/2019    2:24 PM 03/31/2019   10:09 AM 03/09/2019    9:47 AM  PHQ 2/9 Scores  PHQ - 2 Score 0 0 1 0 0 0 0  PHQ- 9 Score     3      Fall Risk    01/30/2023    2:13 PM 12/26/2022   11:44 AM 09/12/2022   11:40 AM 01/11/2022    2:21 PM 01/04/2021    2:07 PM  Fall Risk   Falls in the past year? 1 1 0 1 1  Comment lost balance   tripped on curb equilibrium is off  Number falls in past yr: 1 0 0 0 1  Injury with Fall? 0 0 0 0 0  Risk for fall due to : Impaired balance/gait;History of fall(s);Impaired mobility;Medication side effect History of fall(s);Impaired balance/gait No Fall Risks Impaired balance/gait;Impaired mobility;Medication side effect Impaired balance/gait;Impaired mobility;Medication side effect  Follow up Falls evaluation completed;Education provided;Falls prevention discussed Falls evaluation completed Falls evaluation completed Falls evaluation completed;Education provided;Falls prevention discussed Falls evaluation completed;Education provided;Falls prevention discussed    FALL RISK PREVENTION PERTAINING TO THE HOME:  Any stairs in or around the home? Yes  If so, are there any without handrails? No  Home free of loose throw rugs in walkways, pet beds, electrical cords, etc? Yes  Adequate lighting in your home to reduce risk of falls? Yes   ASSISTIVE DEVICES UTILIZED TO PREVENT FALLS:  Life alert? No  Use of a cane, walker or w/c? Yes  Grab bars in the bathroom? No  Shower chair or bench in shower? Yes  Elevated  toilet seat or a handicapped toilet? Yes   TIMED UP AND GO:  Was the test performed? No .      Cognitive Function:        01/30/2023    2:18 PM 01/11/2022    2:26 PM 01/04/2021    2:13 PM 12/30/2019    2:27 PM 12/25/2018    9:54 AM  6CIT Screen  What Year? 4 points 0 points 0 points 0 points 0 points  What month? 0 points 0 points 0 points 0 points 0 points  What time? 0 points 0 points 0 points 0 points 0 points  Count back from 20 0 points 0 points 0 points 0 points 0 points  Months in reverse 0 points 4 points 0 points 0 points 0 points  Repeat phrase 4 points 6 points 8 points 4 points 2 points  Total Score 8 points 10 points 8 points 4 points 2 points    Immunizations Immunization History  Administered Date(s) Administered   Fluad Quad(high Dose 65+) 08/07/2019, 08/15/2020, 08/29/2021, 09/12/2022   Influenza, High Dose Seasonal PF 09/06/2017   Influenza-Unspecified 08/04/2018   Moderna SARS-COV2 Booster Vaccination 09/30/2020   Moderna Sars-Covid-2 Vaccination 12/27/2019, 01/24/2020   PFIZER(Purple Top)SARS-COV-2 Vaccination 05/29/2021   Pfizer Covid-19 Vaccine Bivalent Booster 5y-11y 06/13/2022   Pneumococcal Conjugate-13 08/27/2019   Pneumococcal Polysaccharide-23 06/09/2010   Pneumococcal-Unspecified 06/18/2011   Tdap 09/26/2021   Zoster Recombinat (Shingrix) 08/27/2019, 10/26/2019    TDAP status: Up to date  Flu Vaccine status: Up to date  Pneumococcal vaccine status: Up to date  Covid-19 vaccine status: Completed vaccines  Qualifies for Shingles Vaccine? Yes   Zostavax completed Yes   Shingrix Completed?: Yes  Screening Tests Health Maintenance  Topic Date Due   COVID-19 Vaccine (5 - 2023-24 season) 08/08/2022   Medicare Annual Wellness (AWV)  01/11/2023   DTaP/Tdap/Td (2 - Td or Tdap) 09/27/2031   Pneumonia Vaccine 60+ Years old  Completed   INFLUENZA VACCINE  Completed   Zoster Vaccines- Shingrix  Completed   HPV VACCINES  Aged Out    Health  Maintenance  Health Maintenance Due  Topic Date Due   COVID-19 Vaccine (5 - 2023-24 season) 08/08/2022   Medicare Annual Wellness (AWV)  01/11/2023    Colorectal cancer screening: No longer required.   Lung Cancer Screening: (Low Dose CT Chest recommended if Age 65-80 years, 30 pack-year currently smoking OR have quit w/in 15years.) does not qualify.   Lung Cancer Screening Referral: no  Additional Screening:  Hepatitis C Screening: does not qualify;   Vision Screening: Recommended annual ophthalmology exams for early detection of glaucoma and other disorders of the eye. Is the patient up to date with their annual eye exam?  Yes  Who is the provider or what is the name of the office in which the patient attends annual eye exams? [facility] If pt is not established with a provider, would they like to be referred to a provider to establish care? No .   Dental Screening: Recommended annual dental exams for proper oral hygiene  Community Resource Referral / Chronic Care Management: CRR required this visit?  No   CCM required this visit?  No      Plan:     I have personally reviewed and noted the following in the  patient's chart:   Medical and social history Use of alcohol, tobacco or illicit drugs  Current medications and supplements including opioid prescriptions. Patient is currently taking opioid prescriptions. Information provided to patient regarding non-opioid alternatives. Patient advised to discuss non-opioid treatment plan with their provider. Functional ability and status Nutritional status Physical activity Advanced directives List of other physicians Hospitalizations, surgeries, and ER visits in previous 12 months Vitals Screenings to include cognitive, depression, and falls Referrals and appointments  In addition, I have reviewed and discussed with patient certain preventive protocols, quality metrics, and best practice recommendations. A written  personalized care plan for preventive services as well as general preventive health recommendations were provided to patient.     Kellie Simmering, LPN   [id number]   Nurse Notes: none  Due to this being a virtual visit, the after visit summary with patients personalized plan was offered to patient via mail or my-chart.  to pick up at office at next visit

## 2023-01-30 NOTE — Patient Instructions (Signed)
Wayne [name] , Thank you for taking time to come for your Medicare Wellness Visit. I appreciate your ongoing commitment to your health goals. Please review the following plan we discussed and let me know if I can assist you in the future.   These are the goals we discussed:  Goals       Manage My Medicine      Timeframe:  Long-Range Goal Priority:  High                            Expected End Date:                       Follow Up Date: 07/17/2022 In Progress:    - call for medicine refill 2 or 3 days before it runs out - call if I am sick and can't take my medicine - use a pillbox to sort medicine - use an alarm clock or phone to remind me to take my medicine    Why is this important?   These steps will help you keep on track with your medicines.         Patient Stated (pt-stated)      Wants to remain in best health that he can be in      Patient Stated      12/30/2019, no goals      Patient Stated      01/04/2021, wants to decrease pain      Patient Stated      01/11/2022, no goals      Patient Stated      01/30/2023, no goals      Pharmacy Care Plan      CARE PLAN ENTRY (see longitudinal plan of care for additional care plan information)  Current Barriers:  Chronic Disease Management support, education, and care coordination needs related to Hypertension, Hyperlipidemia, and Osteoarthritis   Hypertension BP Readings from Last 3 Encounters:  08/15/20 124/70  12/30/19 112/66  12/30/19 112/66  Pharmacist Clinical Goal(s): Over the next 180 days, patient will work with PharmD and providers to maintain BP goal <130/80 Current regimen:  Amlodipine '5mg'$  daily Valsartan/HCTZ 80-12.'5mg'$  daily Interventions: Provided dietary and exercise recommendations Recommend patient obtain home blood pressure monitor if he begins to feel symptomatic (headaches, dizziness, lightheadedness, etc) Advised patient to he could have blood pressure checked at local pharmacy if needed Patient  self care activities - Over the next 180 days, patient will: Check BP if symptomatic, document, and provide at future appointments Ensure daily salt intake < 2300 mg/day Exercise as able with a goal of 30 minutes 5 times weekly (150 minutes per week total)  Hyperlipidemia Lab Results  Component Value Date/Time   LDLCALC 113 (H) 08/15/2020 02:51 PM  Pharmacist Clinical Goal(s): Over the next 180 days, patient will work with PharmD and providers to achieve LDL goal < 100 Current regimen:  Omega-3 fatty acids '1200mg'$  once daily Red yeast rice 2 capsules at bedtime Interventions: Provided dietary and exercise recommendations Discussed appropriate goal for LDL (less than 100) Patient self care activities - Over the next 180 days, patient will: Exercise as able with a goal of 30 minutes 5 times weekly (150 minutes per week total) Focus on heart healthy, well-balanced diet (diet recommendations included below)  Osteoarthritis Pharmacist Clinical Goal(s) Over the next 90 days, patient will work with PharmD and providers to manage symptoms of arthritis and pain Current regimen:  Oxycodone/APAP 5/'325mg'$  1-2 tablets daily as needed for pain Interventions: Discussed previous treatments patient has tried for arthritis pain  Recommend Voltaren gel over the counter to be applied to joints topically for pain Recommend scheduling Tylenol '500mg'$  to help with pain Patient self care activities - Over the next 90 days, patient will: Try using Voltaren gel 1% (available over the counter without a prescription) using 2 grams applied to affected joints up to four times daily as needed Try scheduled Tylenol '500mg'$  daily. Take 2 tablets in the morning and 2 tablets in the evening daily  Medication management Pharmacist Clinical Goal(s): Over the next 180 days, patient will work with PharmD and providers to maintain optimal medication adherence Current pharmacy: [facility] Interventions Comprehensive medication review performed. Continue current medication management strategy Patient self care activities - Over the next 180 days, patient will: Focus on medication adherence by considering use of a pill box Take medications as prescribed Report any questions or concerns to PharmD and/or provider(s)  Please see past updates related to this goal by clicking on the "Past Updates" button in the selected goal          This is a list of the screening recommended for you and due dates:  Health Maintenance  Topic Date Due   COVID-19 Vaccine (5 - 2023-24 season) 08/08/2022   Medicare Annual Wellness Visit  01/31/2024   DTaP/Tdap/Td vaccine (2 - Td or Tdap) 09/27/2031   Pneumonia Vaccine  Completed   Flu Shot  Completed   Zoster (Shingles) Vaccine  Completed   HPV Vaccine  Aged Out    Advanced directives: Please bring a copy of your POA (Power of [place]) and/or Living Will to your next appointment.   Conditions/risks identified: none  Next appointment: Follow up in one year for your annual wellness visit.   Preventive Care 87 Years and Older, Male  Preventive care refers to lifestyle choices and visits with your health care provider that can promote health and wellness. What does preventive care include? A yearly physical exam. This is also called an annual well check. Dental exams once or twice a year. Routine eye exams. Ask your health care provider how often you should have your eyes checked. Personal lifestyle choices, including: Daily care of your teeth and gums. Regular physical activity. Eating a healthy diet. Avoiding tobacco and drug use. Limiting alcohol use. Practicing safe sex. Taking low doses of aspirin every day. Taking vitamin and mineral supplements as recommended by your health care provider. What happens during an annual well check? The services and screenings done by your health care provider during your annual well  check will depend on your age, overall health, lifestyle risk factors, and family history of disease. Counseling  Your health care provider may ask you questions about your: Alcohol use. Tobacco use. Drug use. Emotional well-being. Home and relationship well-being. Sexual activity. Eating habits. History of falls. Memory and ability to understand (cognition). Work and work Statistician. Screening  You may have the following tests or measurements: Height, weight, and BMI. Blood pressure. Lipid and cholesterol levels. These may be checked every 5 years, or more frequently if you are over 51 years old. Skin check. Lung cancer screening. You may have this screening every year starting at age 56 if you have a 30-pack-year history of smoking and currently smoke or have quit within the past 15 years. Fecal occult blood test (FOBT) of the stool. You may have this test every year starting at  age 62. Flexible sigmoidoscopy or colonoscopy. You may have a sigmoidoscopy every 5 years or a colonoscopy every 10 years starting at age 73. Prostate cancer screening. Recommendations will vary depending on your family history and other risks. Hepatitis C blood test. Hepatitis B blood test. Sexually transmitted disease (STD) testing. Diabetes screening. This is done by checking your blood sugar (glucose) after you have not eaten for a while (fasting). You may have this done every 1-3 years. Abdominal aortic aneurysm (AAA) screening. You may need this if you are a current or former smoker. Osteoporosis. You may be screened starting at age 50 if you are at high risk. Talk with your health care provider about your test results, treatment options, and if necessary, the need for more tests. Vaccines  Your health care provider may recommend certain vaccines, such as: Influenza vaccine. This is recommended every year. Tetanus, diphtheria, and acellular pertussis (Tdap, Td) vaccine. You may need a Td booster  every 10 years. Zoster vaccine. You may need this after age 90. Pneumococcal 13-valent conjugate (PCV13) vaccine. One dose is recommended after age 50. Pneumococcal polysaccharide (PPSV23) vaccine. One dose is recommended after age 44. Talk to your health care provider about which screenings and vaccines you need and how often you need them. This information is not intended to replace advice given to you by your health care provider. Make sure you discuss any questions you have with your health care provider. Document Released: 12/16/2015 Document Revised: 08/08/2016 Document Reviewed: 09/20/2015 Elsevier Interactive Patient Education  2017 [place] Prevention in the Home Falls can cause injuries. They can happen to people of all ages. There are many things you can do to make your home safe and to help prevent falls. What can I do on the outside of my home? Regularly fix the edges of walkways and driveways and fix any cracks. Remove anything that might make you trip as you walk through a door, such as a raised step or threshold. Trim any bushes or trees on the path to your home. Use bright outdoor lighting. Clear any walking paths of anything that might make someone trip, such as rocks or tools. Regularly check to see if handrails are loose or broken. Make sure that both sides of any steps have handrails. Any raised decks and porches should have guardrails on the edges. Have any leaves, snow, or ice cleared regularly. Use sand or salt on walking paths during winter. Clean up any spills in your garage right away. This includes oil or grease spills. What can I do in the bathroom? Use night lights. Install grab bars by the toilet and in the tub and shower. Do not use towel bars as grab bars. Use non-skid mats or decals in the tub or shower. If you need to sit down in the shower, use a plastic, non-slip stool. Keep the floor dry. Clean up any water that spills on the floor as soon  as it happens. Remove soap buildup in the tub or shower regularly. Attach bath mats securely with double-sided non-slip rug tape. Do not have throw rugs and other things on the floor that can make you trip. What can I do in the bedroom? Use night lights. Make sure that you have a light by your bed that is easy to reach. Do not use any sheets or blankets that are too big for your bed. They should not hang down onto the floor. Have a firm chair that has side arms. You can  use this for support while you get dressed. Do not have throw rugs and other things on the floor that can make you trip. What can I do in the kitchen? Clean up any spills right away. Avoid walking on wet floors. Keep items that you use a lot in easy-to-reach places. If you need to reach something above you, use a strong step stool that has a grab bar. Keep electrical cords out of the way. Do not use floor polish or wax that makes floors slippery. If you must use wax, use non-skid floor wax. Do not have throw rugs and other things on the floor that can make you trip. What can I do with my stairs? Do not leave any items on the stairs. Make sure that there are handrails on both sides of the stairs and use them. Fix handrails that are broken or loose. Make sure that handrails are as long as the stairways. Check any carpeting to make sure that it is firmly attached to the stairs. Fix any carpet that is loose or worn. Avoid having throw rugs at the top or bottom of the stairs. If you do have throw rugs, attach them to the floor with carpet tape. Make sure that you have a light switch at the top of the stairs and the bottom of the stairs. If you do not have them, ask someone to add them for you. What else can I do to help prevent falls? Wear shoes that: Do not have high heels. Have rubber bottoms. Are comfortable and fit you well. Are closed at the toe. Do not wear sandals. If you use a stepladder: Make sure that it is fully  opened. Do not climb a closed stepladder. Make sure that both sides of the stepladder are locked into place. Ask someone to hold it for you, if possible. Clearly mark and make sure that you can see: Any grab bars or handrails. First and last steps. Where the edge of each step is. Use tools that help you move around (mobility aids) if they are needed. These include: Canes. Walkers. Scooters. Crutches. Turn on the lights when you go into a dark area. Replace any light bulbs as soon as they burn out. Set up your furniture so you have a clear path. Avoid moving your furniture around. If any of your floors are uneven, fix them. If there are any pets around you, be aware of where they are. Review your medicines with your doctor. Some medicines can make you feel dizzy. This can increase your chance of falling. Ask your doctor what other things that you can do to help prevent falls. This information is not intended to replace advice given to you by your health care provider. Make sure you discuss any questions you have with your health care provider. Document Released: 09/15/2009 Document Revised: 04/26/2016 Document Reviewed: 12/24/2014 Elsevier Interactive Patient Education  2017 [employer].

## 2023-02-05 ENCOUNTER — Ambulatory Visit (INDEPENDENT_AMBULATORY_CARE_PROVIDER_SITE_OTHER): Payer: Medicare Other | Admitting: Internal Medicine

## 2023-02-05 ENCOUNTER — Encounter: Payer: Self-pay | Admitting: Internal Medicine

## 2023-02-05 VITALS — BP 150/70 | HR 86 | Temp 98.5°F | Ht 63.0 in | Wt 152.0 lb

## 2023-02-05 DIAGNOSIS — N6310 Unspecified lump in the right breast, unspecified quadrant: Secondary | ICD-10-CM | POA: Diagnosis not present

## 2023-02-05 DIAGNOSIS — N62 Hypertrophy of breast: Secondary | ICD-10-CM | POA: Diagnosis not present

## 2023-02-05 DIAGNOSIS — N1831 Chronic kidney disease, stage 3a: Secondary | ICD-10-CM

## 2023-02-05 DIAGNOSIS — I1310 Hypertensive heart and chronic kidney disease without heart failure, with stage 1 through stage 4 chronic kidney disease, or unspecified chronic kidney disease: Secondary | ICD-10-CM

## 2023-02-05 DIAGNOSIS — I701 Atherosclerosis of renal artery: Secondary | ICD-10-CM | POA: Diagnosis not present

## 2023-02-05 NOTE — Progress Notes (Unsigned)
Barnet Glasgow Martin,acting as a Education administrator for [name], MD.,have documented all relevant documentation on the behalf of [name], MD,as directed by  [name], MD while in the presence of [name], MD.    Subjective:     Patient ID: Wayne [name] , male    DOB: 1926/04/23 , 89 y.o.   MRN: [id number]   Chief Complaint  Patient presents with   Cyst    HPI  Pt presents today for knot on his chest.  Patient first noticed the knot last week. He states his nipple has been sore but when he felt it the lump is not hard, and he can move it around. He denies having any trauma to his chest area. Denies recent fall.   BP Readings from Last 3 Encounters: 02/05/23 : (!) 150/70 12/26/22 : 130/78 09/12/22 : 130/68       Past Medical History:  Diagnosis Date   Arthritis    Cataract    Glaucoma    Hypertension      Family History  Problem Relation Age of Onset   Dementia Mother    Cancer Father      Current Outpatient Medications:    aspirin EC 81 MG tablet, Take 81 mg by mouth daily., Disp: , Rfl:    B Complex-C (B-COMPLEX WITH VITAMIN C) tablet, Take 1 tablet by mouth daily., Disp: , Rfl:    Cholecalciferol (VITAMIN D3) 125 MCG (5000 UT) CAPS, Take by mouth daily. Monday, Wednesday, Friday, Disp: , Rfl:    fluocinonide cream (LIDEX) [id number] %, Apply 1 application. topically 2 (two) times daily., Disp: 120 g, Rfl: 2   furosemide (LASIX) 40 MG tablet, Take 40 mg by mouth daily., Disp: , Rfl:    gabapentin (NEURONTIN) 300 MG capsule, TAKE 1 CAPSULE BY MOUTH TWICE  DAILY, Disp: 200 capsule, Rfl: 2   Multiple Vitamin (MULTIVITAMIN WITH MINERALS) TABS tablet, Take 1 tablet by mouth daily., Disp: , Rfl:    Omega-3 Fatty Acids (FISH OIL) 1200 MG CAPS, Take 1 capsule by mouth daily. , Disp: , Rfl:    oxyCODONE-acetaminophen (PERCOCET/ROXICET) 5-325 MG tablet, Take 1-2 tablets by mouth daily as needed for pain. , Disp: , Rfl: 0   Saw Palmetto 450 MG CAPS, Take 2  capsules by mouth daily., Disp: , Rfl:    tamsulosin (FLOMAX) 0.4 MG CAPS capsule, Take 0.4 mg by mouth daily. , Disp: , Rfl:    timolol (BETIMOL) 0.5 % ophthalmic solution, Place 1 drop into both eyes daily. , Disp: , Rfl:    valsartan-hydrochlorothiazide (DIOVAN-HCT) 80-12.5 MG tablet, Take 1 tablet by mouth daily., Disp: 90 tablet, Rfl: 2   amLODipine (NORVASC) 5 MG tablet, TAKE 1 TABLET BY MOUTH DAILY (Patient not taking: Reported on 01/30/2023), Disp: 100 tablet, Rfl: 2   atorvastatin (LIPITOR) 20 MG tablet, TAKE M,W,F ONLY., Disp: 90 tablet, Rfl: 1   diclofenac Sodium (VOLTAREN) 1 % GEL, Apply 2 g topically 4 (four) times daily. (Patient not taking: Reported on 01/30/2023), Disp: 150 g, Rfl: 1   ketoconazole (NIZORAL) 2 % cream, Apply 1 application topically daily. (Patient not taking: Reported on 01/30/2023), Disp: 15 g, Rfl: 1   loratadine (CLARITIN) 10 MG tablet, Take 1 tablet (10 mg total) by mouth daily., Disp: 90 tablet, Rfl: 2   NON FORMULARY, CBD gummies (Patient not taking: Reported on 01/30/2023), Disp: , Rfl:    senna (SENOKOT) 8.6 MG tablet, Take 1 tablet by mouth daily., Disp: ,  Rfl:    Allergies  Allergen Reactions   Cephalexin     Severe mouth and throat dryness.     Review of Systems  Constitutional: Negative.   Respiratory: Negative.    Cardiovascular: Negative.   Endocrine: Negative.   Skin: Negative.   Allergic/Immunologic: Negative.   Hematological: Negative.      Today's Vitals   02/05/23 1218  BP: (!) 150/70  Pulse: 86  Temp: 98.5 F (36.9 C)  TempSrc: Oral  Weight: 152 lb (68.9 kg)  Height: 5\' 3"  (1.6 m)  PainSc: 7   PainLoc: Foot   Body mass index is 26.93 kg/m.  Wt Readings from Last 3 Encounters:  02/26/23 152 lb (68.9 kg)  02/19/23 152 lb (68.9 kg)  02/05/23 152 lb (68.9 kg)   BP Readings from Last 3 Encounters:  02/26/23 136/76  02/19/23 (!) 140/68  02/05/23 (!) 150/70    Objective:  Physical Exam Vitals and nursing note reviewed.   Constitutional:      Appearance: Normal appearance.  HENT:     Head: Normocephalic and atraumatic.     Nose:     Comments: Masked     Mouth/Throat:     Comments: Masked  Eyes:     Extraocular Movements: Extraocular movements intact.  Cardiovascular:     Rate and Rhythm: Normal rate and regular rhythm.     Heart sounds: Normal heart sounds.  Pulmonary:     Effort: Pulmonary effort is normal.     Breath sounds: Normal breath sounds.  Chest:  Breasts:    Right: Mass present.    Musculoskeletal:     Cervical back: Normal range of motion.  Skin:    General: Skin is warm.  Neurological:     General: No focal deficit present.     Mental Status: He is alert.  Psychiatric:        Mood and Affect: Mood normal.      Assessment And Plan:     1. Mass of right breast, unspecified quadrant Comments: Sx possibly due to gynecomastia. He requests further testing. I will refer him for breast u/s and diagnostic mammo of right breast if sx persist. - Beta HCG, Quant ([facility]) - Testosterone,Free and Total - Estradiol - [place] Unlisted Procedure Breast; Future - MM Digital Diagnostic Unilat R; Future  2. Hypertensive heart and renal disease with renal failure, stage 1 through stage 4 or unspecified chronic kidney disease, without heart failure Comments: Chronic, uncontrolled. NO med changes today, previous readings have improved control. If elevated at next visit, will increase valsartan to 160mg  daily.  3. Atherosclerosis of renal artery (HCC) Comments: Chronic, LDL goal < 70.  He is now on ASA and statin therapy.  4. Stage 3a chronic kidney disease ([place]) Comments: Chronic, reminded to avoid NSAIDs.    Patient was given opportunity to ask questions. Patient verbalized understanding of the plan and was able to repeat key elements of the plan. All questions were answered to their satisfaction.   I, [name], MD, have reviewed all documentation for this visit. The documentation on  02/05/23 for the exam, diagnosis, procedures, and orders are all accurate and complete.   IF YOU HAVE BEEN REFERRED TO A SPECIALIST, IT MAY TAKE 1-2 WEEKS TO SCHEDULE/PROCESS THE REFERRAL. IF YOU HAVE NOT HEARD FROM US/SPECIALIST IN TWO WEEKS, PLEASE GIVE [place] A CALL AT [phone].   THE PATIENT IS ENCOURAGED TO PRACTICE SOCIAL DISTANCING DUE TO THE COVID-19 PANDEMIC.

## 2023-02-05 NOTE — Patient Instructions (Signed)
Gynecomastia, Adult Gynecomastia is an overgrowth of gland tissue in a man's breasts. This may cause one or both breasts to become enlarged. The condition often develops in men who have an imbalance of the male sex hormone (testosterone) and the male sex hormone (estrogen). This means that a man may have too much estrogen, too little testosterone, or both. Gynecomastia may be a normal part of aging for some men. It can also happen to adolescent boys during puberty. What are the causes? This condition may be caused by: Certain medicines, such as: Estrogen supplements and medicines that act like estrogen in the body. Medicines that keep testosterone from functioning normally in the body (testosterone-inhibiting drugs). Anabolic steroids. Medicines to treat heartburn, cancer, heart disease, mental health problems, HIV, or AIDS. Antibiotic medicine. Chemotherapy medicine. Recreational drugs, including alcohol, marijuana, and opioids. Herbal products, including lavender and tea tree oil. A gene that is passed from parent to child (inherited). Certain medical conditions, such as: Tumors in the pituitary or adrenal gland. An overactive thyroid gland. Certain inherited disorders, including a genetic disease that causes low testosterone in males (Klinefelter syndrome). Cancer of the lung, kidney, liver, testicle, or gastrointestinal tract. Conditions that cause liver or kidney failure. Poor nutrition and starvation. Testicle shrinking or failure (testicularatrophy). In some cases, the cause may not be known. What increases the risk? The following factors may make you more likely to develop this condition: Being 50 years old or older. Being overweight. Abusing alcohol or other drugs. Having a family history of gynecomastia. What are the signs or symptoms? In most cases, breast enlargement is the only symptom. The enlargement may start near the nipple, and the breast tissue may feel firm and  rubbery. Other symptoms may include: Pain or tenderness in the breasts. Itchy breasts. How is this diagnosed? This condition may be diagnosed based on: Your symptoms and medical history. A physical exam. Imaging tests, such as: An ultrasound. A mammogram. An MRI. Blood tests. Removal of a sample of breast tissue to be tested in a lab (biopsy). How is this treated? This condition may go away on its own, without treatment. If gynecomastia is caused by a medical problem or drug abuse, treatment may include: Getting treatment for the underlying medical problem or for drug abuse. Changing or stopping medicines. Medicines to block the effects of estrogen. Taking a testosterone replacement. Surgery to remove breast tissue or any lumps in your breasts. Breast reduction surgery. This may be an option if you have severe or painful gynecomastia. Follow these instructions at home:  Take over-the-counter and prescription medicines only as told by your health care provider. Talk to your health care provider before taking any herbal medicines or diet supplements. Do not abuse drugs or alcohol. Keep all follow-up visits as told by your health care provider. This is important. Contact a health care provider if: Your breast tissue grows larger or gets more swollen or painful. You have a lump in your testicle. You have blood or discharge coming from your nipples. Your nipple changes shape. You develop a hard or painful lump in your breast. Summary Gynecomastia is an overgrowth of gland tissue in a man's breasts. This may cause one or both breasts to become enlarged. In most cases, breast enlargement is the only symptom. The enlargement may start near the nipple, and the breast tissue may feel firm and rubbery. This condition may go away on its own, without treatment. In some cases, treatment for an underlying medical problem or for drug   abuse may be needed. Take over-the-counter and prescription  medicines only as told by your health care provider. Do not abuse drugs or alcohol. This information is not intended to replace advice given to you by your health care provider. Make sure you discuss any questions you have with your health care provider. Document Revised: 10/04/2022 Document Reviewed: 10/04/2022 Elsevier Patient Education  [place].

## 2023-02-08 ENCOUNTER — Ambulatory Visit
Admission: RE | Admit: 2023-02-08 | Discharge: 2023-02-08 | Disposition: A | Discharge status: Home / Self Care | Payer: Medicare Other | Source: Ambulatory Visit | Attending: Internal Medicine | Admitting: Internal Medicine

## 2023-02-08 DIAGNOSIS — K5730 Diverticulosis of large intestine without perforation or abscess without bleeding: Secondary | ICD-10-CM | POA: Diagnosis not present

## 2023-02-08 DIAGNOSIS — K449 Diaphragmatic hernia without obstruction or gangrene: Secondary | ICD-10-CM | POA: Diagnosis not present

## 2023-02-08 DIAGNOSIS — K4090 Unilateral inguinal hernia, without obstruction or gangrene, not specified as recurrent: Secondary | ICD-10-CM | POA: Diagnosis not present

## 2023-02-08 DIAGNOSIS — R1900 Intra-abdominal and pelvic swelling, mass and lump, unspecified site: Secondary | ICD-10-CM

## 2023-02-08 DIAGNOSIS — N281 Cyst of kidney, acquired: Secondary | ICD-10-CM | POA: Diagnosis not present

## 2023-02-08 LAB — TESTOSTERONE,FREE AND TOTAL
Testosterone, Free: 1.4 pg/mL — ABNORMAL LOW (ref 6.6–18.1)
Testosterone: 362 ng/dL (ref 264–916)

## 2023-02-08 LAB — BETA HCG QUANT (REF LAB): hCG Quant: 1 m[IU]/mL (ref 0–3)

## 2023-02-08 LAB — ESTRADIOL: Estradiol: 30 pg/mL (ref 7.6–42.6)

## 2023-02-08 MED ORDER — IOPAMIDOL (ISOVUE-300) INJECTION 61%
80.0000 mL | Freq: Once | INTRAVENOUS | Status: AC | PRN
  Administered 2023-02-08: 80 mL via INTRAVENOUS

## 2023-02-19 ENCOUNTER — Ambulatory Visit: Payer: Medicare Other

## 2023-02-19 VITALS — BP 140/68 | HR 55 | Temp 98.7°F | Ht 63.0 in | Wt 152.0 lb

## 2023-02-19 DIAGNOSIS — I129 Hypertensive chronic kidney disease with stage 1 through stage 4 chronic kidney disease, or unspecified chronic kidney disease: Secondary | ICD-10-CM

## 2023-02-19 NOTE — Progress Notes (Signed)
Patient presents today for BP check, patient currently taking valsartan-hydrochlorothiazide 80-12.5mg . BP Readings from Last 3 Encounters:  02/19/23 (!) 170/98  02/05/23 (!) 150/70  12/26/22 130/78

## 2023-02-26 ENCOUNTER — Ambulatory Visit: Payer: Medicare Other

## 2023-02-26 VITALS — BP 136/76 | HR 45 | Temp 98.5°F | Ht 63.0 in | Wt 152.0 lb

## 2023-02-26 DIAGNOSIS — I129 Hypertensive chronic kidney disease with stage 1 through stage 4 chronic kidney disease, or unspecified chronic kidney disease: Secondary | ICD-10-CM

## 2023-02-26 NOTE — Progress Notes (Signed)
Patient presents today for a BP check, patient currently taking Valsartan-HCTZ 80-12.5mg . BP Readings from Last 3 Encounters:  02/26/23 (!) 150/88  02/19/23 (!) 140/68  02/05/23 (!) 150/70  Per provider- continue with current meds.

## 2023-03-04 ENCOUNTER — Other Ambulatory Visit: Payer: Self-pay

## 2023-03-04 MED ORDER — ATORVASTATIN CALCIUM 20 MG PO TABS: ORAL_TABLET | ORAL | 1 refills | Status: DC

## 2023-03-05 ENCOUNTER — Other Ambulatory Visit: Payer: Self-pay

## 2023-03-05 ENCOUNTER — Emergency Department (HOSPITAL_BASED_OUTPATIENT_CLINIC_OR_DEPARTMENT_OTHER)
Admission: EM | Admit: 2023-03-05 | Discharge: 2023-03-05 | Disposition: A | Discharge status: Home / Self Care | Payer: Medicare Other | Attending: Emergency Medicine | Admitting: Emergency Medicine

## 2023-03-05 ENCOUNTER — Other Ambulatory Visit: Payer: Self-pay | Admitting: Internal Medicine

## 2023-03-05 ENCOUNTER — Encounter (HOSPITAL_BASED_OUTPATIENT_CLINIC_OR_DEPARTMENT_OTHER): Payer: Self-pay | Admitting: Emergency Medicine

## 2023-03-05 DIAGNOSIS — N6310 Unspecified lump in the right breast, unspecified quadrant: Secondary | ICD-10-CM

## 2023-03-05 DIAGNOSIS — M545 Low back pain, unspecified: Secondary | ICD-10-CM | POA: Insufficient documentation

## 2023-03-05 DIAGNOSIS — R001 Bradycardia, unspecified: Secondary | ICD-10-CM | POA: Insufficient documentation

## 2023-03-05 DIAGNOSIS — M549 Dorsalgia, unspecified: Secondary | ICD-10-CM | POA: Diagnosis present

## 2023-03-05 LAB — CBC WITH DIFFERENTIAL/PLATELET
Abs Immature Granulocytes: 0.01 10*3/uL (ref 0.00–0.07)
Basophils Absolute: 0 10*3/uL (ref 0.0–0.1)
Basophils Relative: 1 %
Eosinophils Absolute: 0.1 10*3/uL (ref 0.0–0.5)
Eosinophils Relative: 2 %
HCT: 35.2 % — ABNORMAL LOW (ref 39.0–52.0)
Hemoglobin: 11.9 g/dL — ABNORMAL LOW (ref 13.0–17.0)
Immature Granulocytes: 0 %
Lymphocytes Relative: 43 %
Lymphs Abs: 1.9 10*3/uL (ref 0.7–4.0)
MCH: 33.5 pg (ref 26.0–34.0)
MCHC: 33.8 g/dL (ref 30.0–36.0)
MCV: 99.2 fL (ref 80.0–100.0)
Monocytes Absolute: 0.5 10*3/uL (ref 0.1–1.0)
Monocytes Relative: 12 %
Neutro Abs: 1.8 10*3/uL (ref 1.7–7.7)
Neutrophils Relative %: 42 %
Platelets: 201 10*3/uL (ref 150–400)
RBC: 3.55 MIL/uL — ABNORMAL LOW (ref 4.22–5.81)
RDW: 13.3 % (ref 11.5–15.5)
WBC: 4.3 10*3/uL (ref 4.0–10.5)
nRBC: 0 % (ref 0.0–0.2)

## 2023-03-05 LAB — BASIC METABOLIC PANEL
Anion gap: 10 (ref 5–15)
BUN: 46 mg/dL — ABNORMAL HIGH (ref 8–23)
CO2: 27 mmol/L (ref 22–32)
Calcium: 9.6 mg/dL (ref 8.9–10.3)
Chloride: 104 mmol/L (ref 98–111)
Creatinine, Ser: 1.69 mg/dL — ABNORMAL HIGH (ref 0.61–1.24)
GFR, Estimated: 37 mL/min — ABNORMAL LOW (ref >60)
Glucose, Bld: 90 mg/dL (ref 70–99)
Potassium: 4.1 mmol/L (ref 3.5–5.1)
Sodium: 141 mmol/L (ref 135–145)

## 2023-03-05 LAB — URINALYSIS, ROUTINE W REFLEX MICROSCOPIC
Bacteria, UA: NONE SEEN
Bilirubin Urine: NEGATIVE
Glucose, UA: NEGATIVE mg/dL
Hgb urine dipstick: NEGATIVE
Ketones, ur: NEGATIVE mg/dL
Leukocytes,Ua: NEGATIVE
Nitrite: NEGATIVE
Protein, ur: 30 mg/dL — AB
Specific Gravity, Urine: 1.017 (ref 1.005–1.030)
pH: 6.5 (ref 5.0–8.0)

## 2023-03-05 MED ORDER — METHOCARBAMOL 500 MG PO TABS
250.0000 mg | ORAL_TABLET | Freq: Once | ORAL | Status: AC
  Administered 2023-03-05: 250 mg via ORAL
  Filled 2023-03-05: qty 1

## 2023-03-05 MED ORDER — LIDOCAINE 5 % EX PTCH
1.0000 | MEDICATED_PATCH | CUTANEOUS | Status: DC
  Administered 2023-03-05: 1 via TRANSDERMAL
  Filled 2023-03-05: qty 1

## 2023-03-05 MED ORDER — FENTANYL CITRATE PF 50 MCG/ML IJ SOSY
25.0000 ug | PREFILLED_SYRINGE | Freq: Once | INTRAMUSCULAR | Status: AC
  Administered 2023-03-05: 25 ug via INTRAVENOUS

## 2023-03-05 MED ORDER — LIDOCAINE 5 % EX PTCH: 1.0000 | MEDICATED_PATCH | CUTANEOUS | 0 refills | Status: DC

## 2023-03-05 MED ORDER — FENTANYL CITRATE PF 50 MCG/ML IJ SOSY
25.0000 ug | PREFILLED_SYRINGE | Freq: Once | INTRAMUSCULAR | Status: DC
  Filled 2023-03-05: qty 1

## 2023-03-05 NOTE — ED Provider Notes (Addendum)
[facility] Provider Note   CSN: [id number] Arrival date & time: 03/05/23  1204     History  Chief Complaint  Patient presents with   Back Pain    Wayne [name] is a 89 y.o. male with a past medical history of CAD, hyperlipidemia, CKD stage III and renal artery atherosclerosis presenting today with back pain.  He reports has been going on for a month but has acutely worsened over the past couple days.  No saddle anesthesia, bowel/bladder dysfunction, IVDU, fevers, leg weakness or falls.  Says that he "does not know if it is kidney or his back."  He does not use the Voltaren gel he was previously given because it is "too hot" but does take Tylenol arthritis.  Also reports that he takes oxycodone at home.   Back Pain      Home Medications Prior to Admission medications   Medication Sig Start Date End Date Taking? Authorizing Provider  amLODipine (NORVASC) 5 MG tablet TAKE 1 TABLET BY MOUTH DAILY Patient not taking: Reported on 01/30/2023 01/16/23   Glendale Chard, MD  aspirin EC 81 MG tablet Take 81 mg by mouth daily.    [provider]  atorvastatin (LIPITOR) 20 MG tablet TAKE M,W,F ONLY. 03/04/23   Glendale Chard, MD  B Complex-C (B-COMPLEX WITH VITAMIN C) tablet Take 1 tablet by mouth daily.    [provider]  Cholecalciferol (VITAMIN D3) 125 MCG (5000 UT) CAPS Take by mouth daily. Monday, Wednesday, Friday    [provider]  diclofenac Sodium (VOLTAREN) 1 % GEL Apply 2 g topically 4 (four) times daily. Patient not taking: Reported on 01/30/2023 03/13/21   Glendale Chard, MD  fluocinonide cream (LIDEX) [id number] % Apply 1 application. topically 2 (two) times daily. 03/14/22   Glendale Chard, MD  furosemide (LASIX) 40 MG tablet Take 40 mg by mouth daily. 01/05/22   [provider]  gabapentin (NEURONTIN) 300 MG capsule TAKE 1 CAPSULE BY MOUTH TWICE  DAILY 07/30/22   Glendale Chard, MD  ketoconazole (NIZORAL) 2 %  cream Apply 1 application topically daily. Patient not taking: Reported on 01/30/2023 08/16/21   Glendale Chard, MD  loratadine (CLARITIN) 10 MG tablet Take 1 tablet (10 mg total) by mouth daily. 03/31/19 12/26/22  Glendale Chard, MD  Multiple Vitamin (MULTIVITAMIN WITH MINERALS) TABS tablet Take 1 tablet by mouth daily.    [provider]  NON FORMULARY CBD gummies Patient not taking: Reported on 01/30/2023    [provider]  Omega-3 Fatty Acids (FISH OIL) 1200 MG CAPS Take 1 capsule by mouth daily.     [provider]  oxyCODONE-acetaminophen (PERCOCET/ROXICET) 5-325 MG tablet Take 1-2 tablets by mouth daily as needed for pain.  07/18/17   [provider]  Saw Palmetto 450 MG CAPS Take 2 capsules by mouth daily.    [provider]  senna (SENOKOT) 8.6 MG tablet Take 1 tablet by mouth daily.    [provider]  tamsulosin (FLOMAX) 0.4 MG CAPS capsule Take 0.4 mg by mouth daily.  05/15/19   [provider]  timolol (BETIMOL) 0.5 % ophthalmic solution Place 1 drop into both eyes daily.     [provider]  valsartan-hydrochlorothiazide (DIOVAN-HCT) 80-12.5 MG tablet Take 1 tablet by mouth daily. 11/22/21   Bary Castilla, NP      Allergies    Cephalexin    Review of Systems   Review of Systems  Musculoskeletal:  Positive  for back pain.    Physical Exam Updated Vital Signs BP 129/72   Pulse (!) 56   Temp 98.1 F (36.7 C) (Oral)   Resp 16   Wt 68 kg   SpO2 97%   BMI 26.57 kg/m  Physical Exam Vitals and nursing note reviewed.  Constitutional:      General: He is not in acute distress.    Appearance: Normal appearance. He is not ill-appearing.  HENT:     Head: Normocephalic and atraumatic.  Eyes:     General: No scleral icterus.    Conjunctiva/sclera: Conjunctivae normal.  Cardiovascular:     Pulses: Normal pulses.     Comments: Normal DP pulses Pulmonary:     Effort: Pulmonary effort is normal. No  respiratory distress.  Abdominal:     General: Abdomen is flat.     Palpations: Abdomen is soft.  Musculoskeletal:        General: Normal range of motion.     Comments: No midline tenderness.  Full range of motion of all levels of the spine.  No CVA tenderness either.  Skin:    Findings: No rash.  Neurological:     Mental Status: He is alert.  Psychiatric:        Mood and Affect: Mood normal.     ED Results / Procedures / Treatments   Labs (all labs ordered are listed, but only abnormal results are displayed) Labs Reviewed  CBC WITH DIFFERENTIAL/PLATELET - Abnormal; Notable for the following components:      Result Value   RBC 3.55 (*)    Hemoglobin 11.9 (*)    HCT 35.2 (*)    All other components within normal limits  BASIC METABOLIC PANEL - Abnormal; Notable for the following components:   BUN 46 (*)    Creatinine, Ser 1.69 (*)    GFR, Estimated 37 (*)    All other components within normal limits  URINALYSIS, ROUTINE W REFLEX MICROSCOPIC - Abnormal; Notable for the following components:   Protein, ur 30 (*)    All other components within normal limits    EKG None  Radiology No results found.  Procedures Procedures   Medications Ordered in ED Medications  lidocaine (LIDODERM) 5 % 1 patch (1 patch Transdermal Patch Applied 03/05/23 1427)  methocarbamol (ROBAXIN) tablet 250 mg (250 mg Oral Given 03/05/23 1426)    ED Course/ Medical Decision Making/ A&P Clinical Course as of 03/05/23 1903  Tue Mar 05, 2023  1536 Patient reevaluated by PA student Sam.  Patient reports no improvement in his pain [MR]  1559 Patient reevaluated.  He believes that the lidocaine patch and muscle relaxant did help him some.  Heart rate is in the 40s.  EKG ordered [MR]    Clinical Course User Index [MR] Tyneshia Stivers, Cecilio Asper, PA-C                             Medical Decision Making Amount and/or Complexity of Data Reviewed Labs: ordered.  Risk Prescription drug  management.   89 year old male presenting today with back pain.  The emergent differential diagnosis for back pain includes but is not limited to fracture, muscle strain, cauda equina, spinal stenosis. DDD, ankylosing spondylitis, acute ligamentous injury, disk herniation, spondylolisthesis, Epidural compression syndrome, metastatic cancer, transverse myelitis, vertebral osteomyelitis, diskitis, kidney stone, pyelonephritis, AAA, Perforated ulcer, Retrocecal appendicitis, pancreatitis, bowel obstruction, retroperitoneal hemorrhage or mass, meningitis.   This is not an exhaustive  differential.    Past Medical History / Co-morbidities / Social History: CKD, radiculopathy and chronic back pain   Additional history: Per chart review and PDMP review patient is on 82-325 Percocet.  Also previously prescribed Voltaren   Physical Exam: Pertinent physical exam findings include Reproducible on physical exam.  Paraspinal muscles of the lumbar spine around L3/L4.  No midline tenderness. Pain is worse with flexion of the hip Strong and equal DP pulses Occasional episodes of bradycardia to upper 30s/40s  Lab Tests: I ordered, and personally interpreted labs.  The pertinent results include: Labs on par with his CKD.  Hemoglobin 11.9.   Imaging Studies: I considered imaging the patient's spine however he has no midline tenderness or history of back surgeries/injuries.  He has an MRI scheduled outpatient, do not believe imaging is 100% indicated today.  Has had recent CT that had no aortic abnormalities  Medications: I ordered medication including Lidoderm and low-dose Robaxin.  Risks and benefits of muscle relaxants were discussed at bedside prior to trying the Robaxin.  On reevaluation patient reported no improvement.  IV fentanyl ordered.  Pain improved   MDM/Disposition: This is a 89 year old male who presented with back pain.  Appears to be acute on chronic.  Due to his age, there is always  concern for things such as AAA, ACS, dissection, etc. I believe all of these to be less likely given the reproducible nature of his pain.  He also has stable vital signs.  Per chart review patient has no history of AAA.  No red flags outside of the patient's age.  He was treated with Lidoderm patch as well as Robaxin.  Continue to have pain.  Then was given IV fentanyl which relieved his pain.  Patient was seen by my attending physician, Dr. Dina Rich.  We both spoke with the patient at bedside because his heart rate was ranging anywhere from upper 30s to mid 50s.  Patient was asymptomatic of this.  Tells me that he is not lightheaded, dizzy or presyncopal.  He was able to ambulate the room into the restroom without any symptoms.  Attending also spoke to patient's daughter and they feel comfortable taking the patient home.  Per chart review patient has not seen a cardiologist.  I have placed an ambulatory referral to cardiology and they understand that the cardio team should give them a phone call.  If they do not he will follow-up through his PCP or any other cardiology office in the area.  Patient is alert and oriented.  Neurovascularly intact.  Recent imaging reveals no sign of AAA.  Doubt dissection or ruptured AAA at this moment.  Patient is not in renal failure.  Stable for discharge.   Final Clinical Impression(s) / ED Diagnoses Final diagnoses:  Bradycardia  Acute left-sided low back pain without sciatica    Rx / DC Orders ED Discharge Orders          Ordered    Ambulatory referral to Cardiology       Comments: If you have not heard from the Cardiology office within the next 72 hours please call [phone].   03/05/23 1856    lidocaine (LIDODERM) 5 %  Every 24 hours        03/05/23 1857           Results and diagnoses were explained to the patient. Return precautions discussed in full. Patient had no additional questions and expressed complete understanding.   This chart was  dictated using  voice recognition software.  Despite best efforts to proofread,  errors can occur which can change the documentation meaning.    Rhae Hammock, PA-C 03/05/23 1906    Rhae Hammock, PA-C 03/05/23 1907    Lorelle Gibbs, DO 03/05/23 2332

## 2023-03-05 NOTE — ED Triage Notes (Signed)
Pt arrives pov, to triage with assistance of walker with c/o LT side lower back pain x 1 week . Pt denies dysuria. Pt wearing back brace

## 2023-03-05 NOTE — Discharge Instructions (Addendum)
You came to the emergency department today due to back pain.  You were treated with a lidocaine patch which seemed to help your symptoms.  You may continue to take the Percocet for your pain.  Continue to avoid NSAIDs.  Please follow-up outpatient with your chronic care providers about your back.  Additionally, it is very important for you to see a cardiologist about your low heart rate.  They should call you within the next 3 days but if they do not please call the cardiologist you previously saw.

## 2023-03-05 NOTE — ED Notes (Signed)
RN reviewed discharge instructions with pt. Pt verbalized understanding and had no further questions. VSS upon discharge.  

## 2023-03-05 NOTE — ED Notes (Signed)
ED Provider at bedside. 

## 2023-03-07 ENCOUNTER — Ambulatory Visit: Payer: Self-pay

## 2023-03-07 ENCOUNTER — Other Ambulatory Visit: Payer: Self-pay

## 2023-03-07 ENCOUNTER — Telehealth: Payer: Self-pay

## 2023-03-07 MED ORDER — LIDOCAINE 5 % EX PTCH: 1.0000 | MEDICATED_PATCH | CUTANEOUS | 0 refills | Status: DC

## 2023-03-07 NOTE — Chronic Care Management (AMB) (Signed)
   03/07/2023  Wayne [name] [birth year] [id number]   Reason for Encounter: Patient is not currently enrolled in the CCM program. CCM status changed to previously enrolled.   Horris Latino RN Care Manager/Chronic Care Management [phone]

## 2023-03-07 NOTE — Transitions of Care (Post Inpatient/ED Visit) (Signed)
   03/07/2023  Name: Wayne [name] MRN: [id number] DOB: May 29, 1926  Today's TOC FU Call Status:    Transition Care Management Follow-up Telephone Call Date of Discharge: 03/05/23 Discharge Facility: [facility] ([facility] Type of Discharge: Emergency Department Reason for ED Visit: Other: How have you been since you were released from the hospital?: Better Any questions or concerns?: No  Items Reviewed: Did you receive and understand the discharge instructions provided?: Yes Medications obtained and verified?: Yes (Medications Reviewed) Any new allergies since your discharge?: No Dietary orders reviewed?: Yes Do you have support at home?: No  Home Care and Equipment/Supplies: [place] Ordered?: No Any new equipment or medical supplies ordered?: No  Functional Questionnaire: Do you need assistance with bathing/showering or dressing?: No Do you need assistance with meal preparation?: No Do you need assistance with eating?: No Do you have difficulty maintaining continence: No Do you need assistance with getting out of bed/getting out of a chair/moving?: No Do you have difficulty managing or taking your medications?: No  Follow up appointments reviewed: PCP Follow-up appointment confirmed?: Yes Date of PCP follow-up appointment?: 03/12/23 Follow-up Provider: rob[facility] Follow-up appointment confirmed?: No Reason Specialist Follow-Up Not Confirmed: Patient has Specialist Provider Number and will Call for Appointment Do you need transportation to your follow-up appointment?: No Do you understand care options if your condition(s) worsen?: Yes-patient verbalized understanding    [place]

## 2023-03-12 ENCOUNTER — Encounter: Payer: Self-pay | Admitting: Internal Medicine

## 2023-03-12 ENCOUNTER — Ambulatory Visit (INDEPENDENT_AMBULATORY_CARE_PROVIDER_SITE_OTHER): Payer: Medicare Other | Admitting: Internal Medicine

## 2023-03-12 VITALS — BP 130/64 | HR 58 | Temp 98.5°F | Ht 63.0 in | Wt 153.8 lb

## 2023-03-12 DIAGNOSIS — M549 Dorsalgia, unspecified: Secondary | ICD-10-CM

## 2023-03-12 DIAGNOSIS — R001 Bradycardia, unspecified: Secondary | ICD-10-CM | POA: Diagnosis not present

## 2023-03-12 NOTE — Progress Notes (Signed)
I,Victoria T Hamilton,acting as a scribe for Gwynneth Alimentobyn N Shawntavia Saunders, MD.,have documented all relevant documentation on the behalf of Gwynneth Alimentobyn N Jimi Schappert, MD,as directed by  Gwynneth Alimentobyn N Iretha Kirley, MD while in the presence of Gwynneth Alimentobyn N Chandell Attridge, MD.    Subjective:     Patient ID: Wayne [name] , male    DOB: [birth year] , 89 y.o.   MRN: [id number]   Chief Complaint  Patient presents with   Follow-up    HPI  Patient presents today for ed follow up. [facility] for further evaluation of acute low back pain. been going on for a month but has acutely worsened over the past couple days.  He did not complain of saddle anesthesia, bowel/bladder dysfunction, IVDU, fevers, leg weakness or falls.  He stated that he was not sure if was his "kidney or his back."  He had not been using the Voltaren gel b/c it is 'too hot".  He has been taking oxycodone and Tylenol arthritis w/o relief of his sx.  He was given lidocaine patch and Robaxin which did help his sx.  Workup included EKG, which revealed bradycardia. He was referred to Cardiology for further evaluation.    Denies headache, chest pain, SOB.  Hypertension This is a chronic problem. The current episode started more than 1 year ago. The problem has been gradually improving since onset. The problem is controlled. Past treatments include calcium channel blockers, angiotensin blockers, diuretics and lifestyle changes. The current treatment provides moderate improvement. Hypertensive end-organ damage includes kidney disease.  Hyperlipidemia     Past Medical History:  Diagnosis Date   Arthritis    Cataract    Glaucoma    Hypertension      Family History  Problem Relation Age of Onset   Dementia Mother    Cancer Father      Current Outpatient Medications:    aspirin EC 81 MG tablet, Take 81 mg by mouth daily., Disp: , Rfl:    atorvastatin (LIPITOR) 20 MG tablet, TAKE M,W,F ONLY., Disp: 90  tablet, Rfl: 1   B Complex-C (B-COMPLEX WITH VITAMIN C) tablet, Take 1 tablet by mouth daily., Disp: , Rfl:    Cholecalciferol (VITAMIN D3) 125 MCG (5000 UT) CAPS, Take by mouth daily. Monday, Wednesday, Friday, Disp: , Rfl:    fluocinonide cream (LIDEX) 0.05 %, Apply 1 application. topically 2 (two) times daily., Disp: 120 g, Rfl: 2   furosemide (LASIX) 40 MG tablet, Take 40 mg by mouth daily., Disp: , Rfl:    lidocaine (LIDODERM) 5 %, Place 1 patch onto the skin daily. Remove & Discard patch within 12 hours or as directed by MD, Disp: 30 patch, Rfl: 0   Multiple Vitamin (MULTIVITAMIN WITH MINERALS) TABS tablet, Take 1 tablet by mouth daily., Disp: , Rfl:    Omega-3 Fatty Acids (FISH OIL) 1200 MG CAPS, Take 1 capsule by mouth daily. , Disp: , Rfl:    oxyCODONE-acetaminophen (PERCOCET/ROXICET) 5-325 MG tablet, Take 1-2 tablets by mouth daily as needed for pain. , Disp: , Rfl: 0   Saw Palmetto 450 MG CAPS, Take 2 capsules by mouth daily., Disp: , Rfl:    senna (SENOKOT) 8.6 MG tablet, Take 1 tablet by mouth daily., Disp: , Rfl:    tamsulosin (FLOMAX) 0.4 MG CAPS capsule, Take 0.4 mg by mouth daily. , Disp: , Rfl:    timolol (BETIMOL) 0.5 % ophthalmic solution, Place 1 drop into both eyes daily. , Disp: ,  Rfl:    valsartan-hydrochlorothiazide (DIOVAN-HCT) 80-12.5 MG tablet, Take 1 tablet by mouth daily., Disp: 90 tablet, Rfl: 2   amLODipine (NORVASC) 5 MG tablet, TAKE 1 TABLET BY MOUTH DAILY (Patient not taking: Reported on 01/30/2023), Disp: 100 tablet, Rfl: 2   diclofenac Sodium (VOLTAREN) 1 % GEL, Apply 2 g topically 4 (four) times daily. (Patient not taking: Reported on 01/30/2023), Disp: 150 g, Rfl: 1   gabapentin (NEURONTIN) 300 MG capsule, TAKE 1 CAPSULE BY MOUTH TWICE  DAILY, Disp: 200 capsule, Rfl: 2   ketoconazole (NIZORAL) 2 % cream, Apply 1 application topically daily. (Patient not taking: Reported on 01/30/2023), Disp: 15 g, Rfl: 1   loratadine (CLARITIN) 10 MG tablet, Take 1 tablet (10 mg  total) by mouth daily., Disp: 90 tablet, Rfl: 2   NON FORMULARY, CBD gummies (Patient not taking: Reported on 01/30/2023), Disp: , Rfl:    Allergies  Allergen Reactions   Cephalexin     Severe mouth and throat dryness.     Review of Systems  Constitutional: Negative.   Respiratory: Negative.    Cardiovascular: Negative.   Endocrine: Negative.   Musculoskeletal:  Positive for back pain.  Skin: Negative.   Allergic/Immunologic: Negative.   Neurological: Negative.   Hematological: Negative.      Today's Vitals   03/12/23 1140 03/12/23 1244  BP: (!) 140/82 130/64  Pulse: (!) 58   Temp: 98.5 F (36.9 C)   SpO2: 98%   Weight: 153 lb 12.8 oz (69.8 kg)   Height: 5\' 3"  (1.6 m)    Body mass index is 27.24 kg/m.  BP Readings from Last 3 Encounters:  03/12/23 130/64  03/05/23 (!) 172/77  02/26/23 136/76    Objective:  Physical Exam Vitals and nursing note reviewed.  Constitutional:      Appearance: Normal appearance.  HENT:     Head: Normocephalic and atraumatic.  Eyes:     Extraocular Movements: Extraocular movements intact.  Cardiovascular:     Rate and Rhythm: Normal rate and regular rhythm.     Heart sounds: Normal heart sounds.  Pulmonary:     Effort: Pulmonary effort is normal.     Breath sounds: Normal breath sounds.  Musculoskeletal:        General: Tenderness present.     Cervical back: Normal range of motion.     Comments: Ambulatory w/ walker  Skin:    General: Skin is warm.  Neurological:     General: No focal deficit present.     Mental Status: He is alert.  Psychiatric:        Mood and Affect: Mood normal.         Assessment And Plan:     1. Acute back pain, unspecified back location, unspecified back pain laterality Comments: ED records reviewed. He had improvement with lidocaine patch and Robaxin. He is not symptomatic today.  2. Bradycardia Comments: HR in 40s was noted in 03/05/23 ER visit. He has upcoming Cardiology appt 04/16/23. He denies  having any SOB or dizziness at this time. - TSH     Patient was given opportunity to ask questions. Patient verbalized understanding of the plan and was able to repeat key elements of the plan. All questions were answered to their satisfaction.   I, Gwynneth Alimentobyn N Desmond Tufano, MD, have reviewed all documentation for this visit. The documentation on 03/12/23 for the exam, diagnosis, procedures, and orders are all accurate and complete.   IF YOU HAVE BEEN REFERRED TO A SPECIALIST, IT  MAY TAKE 1-2 WEEKS TO SCHEDULE/PROCESS THE REFERRAL. IF YOU HAVE NOT HEARD FROM US/SPECIALIST IN TWO WEEKS, PLEASE GIVE US A CALL AT [phone].   THE PATIENT IS ENCOURAGED TO PRACTICE SOCIAL DISTANCING DUE TO THE COVID-19 PANDEMIC.   

## 2023-03-12 NOTE — Patient Instructions (Signed)
Hypertension, Adult ?Hypertension is another name for high blood pressure. High blood pressure forces your heart to work harder to pump blood. This can cause problems over time. ?There are two numbers in a blood pressure reading. There is a top number (systolic) over a bottom number (diastolic). It is best to have a blood pressure that is below 120/80. ?What are the causes? ?The cause of this condition is not known. Some other conditions can lead to high blood pressure. ?What increases the risk? ?Some lifestyle factors can make you more likely to develop high blood pressure: ?Smoking. ?Not getting enough exercise or physical activity. ?Being overweight. ?Having too much fat, sugar, calories, or salt (sodium) in your diet. ?Drinking too much alcohol. ?Other risk factors include: ?Having any of these conditions: ?Heart disease. ?Diabetes. ?High cholesterol. ?Kidney disease. ?Obstructive sleep apnea. ?Having a family history of high blood pressure and high cholesterol. ?Age. The risk increases with age. ?Stress. ?What are the signs or symptoms? ?High blood pressure may not cause symptoms. Very high blood pressure (hypertensive crisis) may cause: ?Headache. ?Fast or uneven heartbeats (palpitations). ?Shortness of breath. ?Nosebleed. ?Vomiting or feeling like you may vomit (nauseous). ?Changes in how you see. ?Very bad chest pain. ?Feeling dizzy. ?Seizures. ?How is this treated? ?This condition is treated by making healthy lifestyle changes, such as: ?Eating healthy foods. ?Exercising more. ?Drinking less alcohol. ?Your doctor may prescribe medicine if lifestyle changes do not help enough and if: ?Your top number is above 130. ?Your bottom number is above 80. ?Your personal target blood pressure may vary. ?Follow these instructions at home: ?Eating and drinking ? ?If told, follow the DASH eating plan. To follow this plan: ?Fill one half of your plate at each meal with fruits and vegetables. ?Fill one fourth of your plate  at each meal with whole grains. Whole grains include whole-wheat pasta, brown rice, and whole-grain bread. ?Eat or drink low-fat dairy products, such as skim milk or low-fat yogurt. ?Fill one fourth of your plate at each meal with low-fat (lean) proteins. Low-fat proteins include fish, chicken without skin, eggs, beans, and tofu. ?Avoid fatty meat, cured and processed meat, or chicken with skin. ?Avoid pre-made or processed food. ?Limit the amount of salt in your diet to less than 1,500 mg each day. ?Do not drink alcohol if: ?Your doctor tells you not to drink. ?You are pregnant, may be pregnant, or are planning to become pregnant. ?If you drink alcohol: ?Limit how much you have to: ?0-1 drink a day for women. ?0-2 drinks a day for men. ?Know how much alcohol is in your drink. In the [place] one drink equals one 12 oz bottle of beer (355 mL), one 5 oz glass of wine (148 mL), or one 1? oz glass of hard liquor (44 mL). ?Lifestyle ? ?Work with your doctor to stay at a healthy weight or to lose weight. Ask your doctor what the best weight is for you. ?Get at least 30 minutes of exercise that causes your heart to beat faster (aerobic exercise) most days of the week. This may include walking, swimming, or biking. ?Get at least 30 minutes of exercise that strengthens your muscles (resistance exercise) at least 3 days a week. This may include lifting weights or doing Pilates. ?Do not smoke or use any products that contain nicotine or tobacco. If you need help quitting, ask your doctor. ?Check your blood pressure at home as told by your doctor. ?Keep all follow-up visits. ?Medicines ?Take over-the-counter and prescription medicines   only as told by your doctor. Follow directions carefully. ?Do not skip doses of blood pressure medicine. The medicine does not work as well if you skip doses. Skipping doses also puts you at risk for problems. ?Ask your doctor about side effects or reactions to medicines that you should watch  for. ?Contact a doctor if: ?You think you are having a reaction to the medicine you are taking. ?You have headaches that keep coming back. ?You feel dizzy. ?You have swelling in your ankles. ?You have trouble with your vision. ?Get help right away if: ?You get a very bad headache. ?You start to feel mixed up (confused). ?You feel weak or numb. ?You feel faint. ?You have very bad pain in your: ?Chest. ?Belly (abdomen). ?You vomit more than once. ?You have trouble breathing. ?These symptoms may be an emergency. Get help right away. Call 911. ?Do not wait to see if the symptoms will go away. ?Do not drive yourself to the hospital. ?Summary ?Hypertension is another name for high blood pressure. ?High blood pressure forces your heart to work harder to pump blood. ?For most people, a normal blood pressure is less than 120/80. ?Making healthy choices can help lower blood pressure. If your blood pressure does not get lower with healthy choices, you may need to take medicine. ?This information is not intended to replace advice given to you by your health care provider. Make sure you discuss any questions you have with your health care provider. ?Document Revised: 09/07/2021 Document Reviewed: 09/07/2021 ?Elsevier Patient Education ? 2023 Elsevier Inc. ? ?

## 2023-03-13 LAB — TSH: TSH: 1.09 u[IU]/mL (ref 0.450–4.500)

## 2023-03-14 ENCOUNTER — Ambulatory Visit: Payer: Medicare Other

## 2023-03-14 ENCOUNTER — Ambulatory Visit
Admission: RE | Admit: 2023-03-14 | Discharge: 2023-03-14 | Disposition: A | Discharge status: Home / Self Care | Payer: Medicare Other | Source: Ambulatory Visit | Attending: Internal Medicine | Admitting: Internal Medicine

## 2023-03-14 DIAGNOSIS — N6311 Unspecified lump in the right breast, upper outer quadrant: Secondary | ICD-10-CM | POA: Diagnosis not present

## 2023-03-14 DIAGNOSIS — H401131 Primary open-angle glaucoma, bilateral, mild stage: Secondary | ICD-10-CM | POA: Diagnosis not present

## 2023-03-14 DIAGNOSIS — H33052 Total retinal detachment, left eye: Secondary | ICD-10-CM | POA: Diagnosis not present

## 2023-03-14 DIAGNOSIS — Z961 Presence of intraocular lens: Secondary | ICD-10-CM | POA: Diagnosis not present

## 2023-03-14 DIAGNOSIS — H04123 Dry eye syndrome of bilateral lacrimal glands: Secondary | ICD-10-CM | POA: Diagnosis not present

## 2023-03-14 DIAGNOSIS — N62 Hypertrophy of breast: Secondary | ICD-10-CM | POA: Diagnosis not present

## 2023-03-14 DIAGNOSIS — H02403 Unspecified ptosis of bilateral eyelids: Secondary | ICD-10-CM | POA: Diagnosis not present

## 2023-03-14 DIAGNOSIS — N6310 Unspecified lump in the right breast, unspecified quadrant: Secondary | ICD-10-CM

## 2023-03-18 ENCOUNTER — Other Ambulatory Visit: Payer: Self-pay | Admitting: Internal Medicine

## 2023-03-21 ENCOUNTER — Other Ambulatory Visit: Payer: Self-pay

## 2023-03-21 MED ORDER — ATORVASTATIN CALCIUM 20 MG PO TABS: ORAL_TABLET | ORAL | 2 refills | Status: DC

## 2023-03-26 ENCOUNTER — Ambulatory Visit
Admission: RE | Admit: 2023-03-26 | Discharge: 2023-03-26 | Disposition: A | Discharge status: Home / Self Care | Payer: Medicare Other | Source: Ambulatory Visit | Attending: Urology | Admitting: Urology

## 2023-03-26 DIAGNOSIS — N281 Cyst of kidney, acquired: Secondary | ICD-10-CM

## 2023-03-26 MED ORDER — GADOPICLENOL 0.5 MMOL/ML IV SOLN
7.0000 mL | Freq: Once | INTRAVENOUS | Status: AC | PRN
  Administered 2023-03-26: 7 mL via INTRAVENOUS

## 2023-03-28 ENCOUNTER — Other Ambulatory Visit: Payer: Self-pay

## 2023-04-16 ENCOUNTER — Ambulatory Visit: Payer: Medicare Other | Attending: Interventional Cardiology | Admitting: Interventional Cardiology

## 2023-04-16 ENCOUNTER — Other Ambulatory Visit: Payer: Self-pay

## 2023-04-16 VITALS — BP 172/82 | HR 54 | Ht 63.5 in | Wt 150.8 lb

## 2023-04-16 DIAGNOSIS — I10 Essential (primary) hypertension: Secondary | ICD-10-CM

## 2023-04-16 DIAGNOSIS — I872 Venous insufficiency (chronic) (peripheral): Secondary | ICD-10-CM

## 2023-04-16 DIAGNOSIS — R001 Bradycardia, unspecified: Secondary | ICD-10-CM

## 2023-04-16 DIAGNOSIS — I4510 Unspecified right bundle-branch block: Secondary | ICD-10-CM | POA: Diagnosis not present

## 2023-04-16 MED ORDER — AMLODIPINE BESYLATE 5 MG PO TABS: 5.0000 mg | ORAL_TABLET | Freq: Every day | ORAL | 2 refills | Status: DC

## 2023-04-16 NOTE — Progress Notes (Signed)
Cardiology Office Note   Date:  04/16/2023   ID:  Wayne [name], DOB 07/13/1926, MRN [id number]  PCP:  Wayne Peng, MD    No chief complaint on file.  Bradycardia  Wt Readings from Last 3 Encounters:  03/12/23 153 lb 12.8 oz (69.8 kg)  03/05/23 150 lb (68 kg)  02/26/23 152 lb (68.9 kg)       History of Present Illness: Wayne [name] is a 89 y.o. male who is being seen today for the evaluation of bradycardia at the request of Redwine, Madison A, PA-C.   He was seen in the emergency room for back pain in April 2024 with records showing: "he was noted to be bradycardic on monitor. Going back to previous visits last EKGs he has heart rates in the 50s to 60s. Today he has had lower readings in the 40s. Stable blood pressure. Patient denies any symptoms of sinus bradycardia. He has no findings of CHF. No active chest pain or presyncopal symptoms. He is walked in the department multiple times without any difficulty. This does not appear to be an acute change for him. Electrolytes and other workup was unremarkable. Spoke with the patient and family member at bedside, as well as the daughter-in-law who is a Engineer, civil (consulting). Talked about the possibility of admitting for sinus bradycardia given his age. Patient would prefer to go home. Talked about outpatient follow-up for asymptomatic sinus bradycardia and they understand and agree, ambulatory referral to cardiology has been placed. At the end of my interview patient's heart rate was 54 with stable blood pressure. "  Still lives alone.  Still drives. No problems with dizziness or syncope.  Son lives nearby. Limited by arthritis.   Past Medical History:  Diagnosis Date   Arthritis    Cataract    Glaucoma    Hypertension     Past Surgical History:  Procedure Laterality Date   APPENDECTOMY     BACK SURGERY     ballon angioplasty  03/18/2012   FOOT SURGERY     JOINT REPLACEMENT     kidney stent Left 2015   KNEE SURGERY Left x 2     left ankle     LEFT HEART CATHETERIZATION WITH CORONARY ANGIOGRAM N/A 03/18/2012   Procedure: LEFT HEART CATHETERIZATION WITH CORONARY ANGIOGRAM;  Surgeon: Pamella Pert, MD;  Location: [facility] CATH LAB;  Service: Cardiovascular;  Laterality: N/A;   RENAL ANGIOGRAM N/A 03/18/2012   Procedure: RENAL ANGIOGRAM;  Surgeon: Pamella Pert, MD;  Location: [facility] CATH LAB;  Service: Cardiovascular;  Laterality: N/A;   right wrist     SPINE SURGERY       Current Outpatient Medications  Medication Sig Dispense Refill   amLODipine (NORVASC) 5 MG tablet TAKE 1 TABLET BY MOUTH DAILY (Patient not taking: Reported on 01/30/2023) 100 tablet 2   aspirin EC 81 MG tablet Take 81 mg by mouth daily.     atorvastatin (LIPITOR) 20 MG tablet TAKE M,W,F ONLY. 90 tablet 2   B Complex-C (B-COMPLEX WITH VITAMIN C) tablet Take 1 tablet by mouth daily.     Cholecalciferol (VITAMIN D3) 125 MCG (5000 UT) CAPS Take by mouth daily. Monday, Wednesday, Friday     diclofenac Sodium (VOLTAREN) 1 % GEL Apply 2 g topically 4 (four) times daily. (Patient not taking: Reported on 01/30/2023) 150 g 1   fluocinonide cream (LIDEX) 0.05 % Apply 1 application. topically 2 (two) times daily. 120 g 2   furosemide (LASIX) 40  MG tablet Take 40 mg by mouth daily.     gabapentin (NEURONTIN) 300 MG capsule TAKE 1 CAPSULE BY MOUTH TWICE  DAILY 200 capsule 2   ketoconazole (NIZORAL) 2 % cream Apply 1 application topically daily. (Patient not taking: Reported on 01/30/2023) 15 g 1   lidocaine (LIDODERM) 5 % Place 1 patch onto the skin daily. Remove & Discard patch within 12 hours or as directed by MD 30 patch 0   loratadine (CLARITIN) 10 MG tablet Take 1 tablet (10 mg total) by mouth daily. 90 tablet 2   Multiple Vitamin (MULTIVITAMIN WITH MINERALS) TABS tablet Take 1 tablet by mouth daily.     NON FORMULARY CBD gummies (Patient not taking: Reported on 01/30/2023)     Omega-3 Fatty Acids (FISH OIL) 1200 MG CAPS Take 1 capsule by mouth daily.       oxyCODONE-acetaminophen (PERCOCET/ROXICET) 5-325 MG tablet Take 1-2 tablets by mouth daily as needed for pain.   0   Saw Palmetto 450 MG CAPS Take 2 capsules by mouth daily.     senna (SENOKOT) 8.6 MG tablet Take 1 tablet by mouth daily.     tamsulosin (FLOMAX) 0.4 MG CAPS capsule Take 0.4 mg by mouth daily.      timolol (BETIMOL) 0.5 % ophthalmic solution Place 1 drop into both eyes daily.      valsartan-hydrochlorothiazide (DIOVAN-HCT) 80-12.5 MG tablet Take 1 tablet by mouth daily. 90 tablet 2   No current facility-administered medications for this visit.    Allergies:   Cephalexin    Social History:  The patient  reports that he quit smoking about 50 years ago. His smoking use included cigarettes. He has a 0.75 pack-year smoking history. He has never used smokeless tobacco. He reports that he does not currently use alcohol. He reports current drug use. Drug: Oxycodone.   Family History:  The patient's family history includes Cancer in his father; Dementia in his mother.    ROS:  Please see the history of present illness.   Otherwise, review of systems are positive for back pain.   All other systems are reviewed and negative.    PHYSICAL EXAM: VS:  There were no vitals taken for this visit. , BMI There is no height or weight on file to calculate BMI. GEN: Well nourished, well developed, in no acute distress HEENT: normal Neck: no JVD, carotid bruits, or masses Cardiac: bradycardic; 2/6 early systolic murmur, no rubs, or gallops, bilateral pitting edema L>R Respiratory:  clear to auscultation bilaterally, normal work of breathing GI: soft, nontender, nondistended, + BS MS: no deformity or atrophy Skin: warm and dry, no rash Neuro:  Strength and sensation are intact Psych: euthymic mood, full affect   EKG:   The ekg ordered today demonstrates sinus bradycardia, right bundle branch block, left anterior fascicular block, bifascicular block, normal PR interval, no ST segment  changes   Recent Labs: 05/16/2022: ALT 16 03/05/2023: BUN 46; Creatinine, Ser 1.69; Hemoglobin 11.9; Platelets 201; Potassium 4.1; Sodium 141 03/12/2023: TSH 1.090   Lipid Panel    Component Value Date/Time   CHOL 125 05/16/2022 1136   TRIG 69 05/16/2022 1136   HDL 53 05/16/2022 1136   CHOLHDL 2.4 05/16/2022 1136   LDLCALC 58 05/16/2022 1136     Other studies Reviewed: Additional studies/ records that were reviewed today with results demonstrating: labs reviewed.   ASSESSMENT AND PLAN:  Sinus bradycardia: no symptoms of dizziness or presyncope.  No indication for pacemaker. RBBB: noted in  2015.  Still present.  Left anterior fascicular block.  Normal PR interval.  Sinus bradycardia.   Hypertension: Blood pressure elevated.  Restart amlodipine 5 mg daily.  It was stopped due to lower extremity edema but his lower extremity edema persists even off of the amlodipine.  I think his lower extremity edema is more likely from venous insufficiency.  Elevate legs.  He has Lasix as well to take.   Current medicines are reviewed at length with the patient today.  The patient concerns regarding his medicines were addressed.  The following changes have been made:  restart amlodipine  Labs/ tests ordered today include:  No orders of the defined types were placed in this encounter.   Recommend 150 minutes/week of aerobic exercise Low fat, low carb, high fiber diet recommended  Disposition:   FU as needed if sx arise   Signed, Lance Muss, MD  04/16/2023 1:39 PM    [facility] [address] [place], [place], [place]  [address] Phone: [phone]; Fax: [phone]

## 2023-04-16 NOTE — Patient Instructions (Signed)
Medication Instructions:  Resume amlodipine 5 mg by mouth daily  *If you need a refill on your cardiac medications before your next appointment, please call your pharmacy*   Lab Work: none If you have labs (blood work) drawn today and your tests are completely normal, you will receive your results only by: MyChart Message (if you have MyChart) OR A paper copy in the mail If you have any lab test that is abnormal or we need to change your treatment, we will call you to review the results.   Testing/Procedures: none   Follow-Up: At [facility], you and your health needs are our priority.  As part of our continuing mission to provide you with exceptional heart care, we have created designated Provider Care Teams.  These Care Teams include your primary Cardiologist (physician) and Advanced Practice Providers (APPs -  Physician Assistants and Nurse Practitioners) who all work together to provide you with the care you need, when you need it.  We recommend signing up for the patient portal called "MyChart".  Sign up information is provided on this After Visit Summary.  MyChart is used to connect with patients for Virtual Visits (Telemedicine).  Patients are able to view lab/test results, encounter notes, upcoming appointments, etc.  Non-urgent messages can be sent to your provider as well.   To learn more about what you can do with MyChart, go to [email or web address].    Your next appointment:   As needed  Provider:   Lance Muss, MD     Other Instructions

## 2023-04-22 DIAGNOSIS — D49512 Neoplasm of unspecified behavior of left kidney: Secondary | ICD-10-CM | POA: Diagnosis not present

## 2023-04-24 ENCOUNTER — Telehealth: Payer: Self-pay

## 2023-04-24 ENCOUNTER — Other Ambulatory Visit: Payer: Self-pay | Admitting: Internal Medicine

## 2023-04-24 DIAGNOSIS — N1831 Chronic kidney disease, stage 3a: Secondary | ICD-10-CM

## 2023-04-24 DIAGNOSIS — I1310 Hypertensive heart and chronic kidney disease without heart failure, with stage 1 through stage 4 chronic kidney disease, or unspecified chronic kidney disease: Secondary | ICD-10-CM

## 2023-04-24 DIAGNOSIS — I701 Atherosclerosis of renal artery: Secondary | ICD-10-CM

## 2023-04-24 DIAGNOSIS — I129 Hypertensive chronic kidney disease with stage 1 through stage 4 chronic kidney disease, or unspecified chronic kidney disease: Secondary | ICD-10-CM

## 2023-04-24 NOTE — Telephone Encounter (Signed)
Patients daughter, Wayne [name] called requesting to start the process to get her father into an nursing home. Provider notified. Patient daughter also given the option of remote health until he is to get into an nursing home. Patients daughter agreed. Paper work sent to remote health for their review. Patients daughter aware.

## 2023-04-25 ENCOUNTER — Telehealth: Payer: Self-pay | Admitting: *Deleted

## 2023-04-25 NOTE — Progress Notes (Signed)
  Care Coordination   Note   04/25/2023 Name: Wayne [name] MRN: [id number] DOB: October 24, 1926  Wayne [name] is a 89 y.o. year old male who sees Dorothyann Peng, MD for primary care. I reached out to Charolette Child by phone today to offer care coordination services.  Wayne [name] was given information about Care Coordination services today including:   The Care Coordination services include support from the care team which includes your Nurse Coordinator, Clinical Social Worker, or Pharmacist.  The Care Coordination team is here to help remove barriers to the health concerns and goals most important to you. Care Coordination services are voluntary, and the patient may decline or stop services at any time by request to their care team member.   Care Coordination Consent Status: Patient agreed to services and verbal consent obtained.   Follow up plan:  Telephone appointment with care coordination team member scheduled for:  04/26/23  Encounter Outcome:  Pt. Scheduled  [facility] Coordination Care Guide  Direct Dial: [phone]

## 2023-04-26 ENCOUNTER — Ambulatory Visit: Payer: Self-pay

## 2023-04-26 NOTE — Patient Instructions (Signed)
Visit Information  Thank you for taking time to visit with me today. Please don't hesitate to contact me if I can be of assistance to you.   Following are the goals we discussed today:  -Review resources provided and contact home health agencies directly regarding private duty care -Contact the [facility] office to determine if you are eligible for aid and attendance -Contact an Elder Sales executive for questions regarding protection of assets -Education administrator for meals on wheels  If you are experiencing a Mental Health or [employer] Crisis or need someone to talk to, please go to [facility] [address] [place] [phone]) call 911  The patient verbalized understanding of instructions, educational materials, and care plan provided today and DECLINED offer to receive copy of patient instructions, educational materials, and care plan.   No further follow up required: Please contact me as needed  Bevelyn Ngo, BSW, CDP Social Worker, Certified Dementia Practitioner [facility] Care Management  Care Coordination [phone]

## 2023-04-26 NOTE — Patient Outreach (Signed)
  Care Coordination   Initial Visit Note   04/26/2023 Name: Wayne [name] MRN: [id number] DOB: November 25, 1926  Wayne [name] is a 89 y.o. year old male who sees Dorothyann Peng, MD for primary care. I  spoke with patients daughter [place] Jackson by phone,  What matters to the patients health and wellness today?  Patient needs more assistance in the home    Goals Addressed             This Visit's Progress    COMPLETED: Care Coordination Activities       Care Coordination Interventions: Collaboration with patients primary care provider who indicates the patients family is interested in placement as the patients ability to remain independent in the home is decreasing Contacted the patients daughter [place] Jackson with patients verbal permission Discussed the patient uses a walker for ambulation and experiences arthritis to his ankles and feet which is impacting the patients ability to bathe, prepare food, and ambulate throughout the home Education provided on different levels of care including in home care, placement into assisted living or SNF, and adult day programs. At this time family is not interested in a day program Determined the family is unsure of patients income level and therefore unsure if he would qualify for Special Assistance Medicaid for ALF Education provided on the costs associated to privately pay for long term care as well as requirements to qualify for Medicaid including spending down assets. 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 Number provided to family Determined the family is interested in hiring a caregiver for in the home to check on  the patient a couple times a week - provided family with a list of options Education provided on meals on wheels program - referral placed to [employer] [place] via [place] Discussed plan for family to contact SW as needed Collaboration with patients primary care provider advising of interventions and plan         SDOH assessments and interventions completed:  Yes  SDOH Interventions Today    Flowsheet Row Most Recent Value  SDOH Interventions   Food Insecurity Interventions Intervention Not Indicated  Housing Interventions Intervention Not Indicated  Transportation Interventions Intervention Not Indicated        Care Coordination Interventions:  Yes, provided   Interventions Today    Flowsheet Row Most Recent Value  Chronic Disease   Chronic disease during today's visit Hypertension (HTN), Chronic Kidney Disease/End Stage Renal Disease (ESRD)  General Interventions   General Interventions Discussed/Reviewed General Interventions Discussed, Communication with  Communication with PCP/Specialists  Education Interventions   Education Provided Provided Education  [levels of care]        Follow up plan: No further intervention required.   Encounter Outcome:  Pt. Visit Completed   Bevelyn Ngo, BSW, CDP Social Worker, Certified Dementia Practitioner [facility] Care Management  Care Coordination [phone]

## 2023-05-16 ENCOUNTER — Other Ambulatory Visit: Payer: Self-pay

## 2023-05-16 MED ORDER — GABAPENTIN 300 MG PO CAPS: 300.0000 mg | ORAL_CAPSULE | Freq: Two times a day (BID) | ORAL | 2 refills | Status: DC

## 2023-05-23 ENCOUNTER — Other Ambulatory Visit: Payer: Self-pay | Admitting: Internal Medicine

## 2023-05-23 DIAGNOSIS — K4090 Unilateral inguinal hernia, without obstruction or gangrene, not specified as recurrent: Secondary | ICD-10-CM

## 2023-05-29 DIAGNOSIS — M25562 Pain in left knee: Secondary | ICD-10-CM | POA: Diagnosis not present

## 2023-05-29 DIAGNOSIS — M25572 Pain in left ankle and joints of left foot: Secondary | ICD-10-CM | POA: Diagnosis not present

## 2023-06-19 ENCOUNTER — Telehealth: Payer: Self-pay

## 2023-06-19 ENCOUNTER — Telehealth: Payer: Self-pay | Admitting: *Deleted

## 2023-06-19 ENCOUNTER — Other Ambulatory Visit: Payer: Self-pay | Admitting: Surgery

## 2023-06-19 DIAGNOSIS — K4090 Unilateral inguinal hernia, without obstruction or gangrene, not specified as recurrent: Secondary | ICD-10-CM | POA: Diagnosis not present

## 2023-06-19 NOTE — Telephone Encounter (Signed)
   Name: Wayne [name]  DOB: Feb 11, 1926  MRN: [id number]  Primary Cardiologist: Lance Muss, MD   Preoperative team, please contact this patient and set up a phone call appointment for further preoperative risk assessment. Please obtain consent and complete medication review. Thank you for your help.  I confirm that guidance regarding antiplatelet and oral anticoagulation therapy has been completed and, if necessary, noted below.  Patient's aspirin is not prescribed by cardiology.  Recommendations for holding aspirin will need to come from prescribing provider.  Ronney Asters, NP 06/19/2023, 4:47 PM [facility]

## 2023-06-19 NOTE — Telephone Encounter (Signed)
   Pre-operative Risk Assessment    Patient Name: Wayne [name]  DOB: 1926/10/27 MRN: [id number]      Request for Surgical Clearance    Procedure:   Hernia Repair  Date of Surgery:  Clearance TBD                                 Surgeon:  Dr. Abigail Miyamoto Surgeon's Group or Practice Name:  [facility] Phone number:  [phone] Fax number:  [phone]   Type of Clearance Requested:   - Medical  - Pharmacy:  Hold Aspirin     Type of Anesthesia:  General    Additional requests/questions:    [name]   06/19/2023, 4:42 PM

## 2023-06-19 NOTE — Telephone Encounter (Signed)
Pt scheduled for tele pre op appt 07/22/23 @ 10:20. Pt states surgery to be done sometime in Sept 2024. Med rec and consent are done.      Patient Consent for Virtual Visit        Wayne [name] has provided verbal consent on 06/19/2023 for a virtual visit (video or telephone).   CONSENT FOR VIRTUAL VISIT FOR:  Wayne [name]  By participating in this virtual visit I agree to the following:  I hereby voluntarily request, consent and authorize [facility] and its employed or contracted physicians, physician assistants, nurse practitioners or other licensed health care professionals (the Practitioner), to provide me with telemedicine health care services (the "Services") as deemed necessary by the treating Practitioner. I acknowledge and consent to receive the Services by the Practitioner via telemedicine. I understand that the telemedicine visit will involve communicating with the Practitioner through live audiovisual communication technology and the disclosure of certain medical information by electronic transmission. I acknowledge that I have been given the opportunity to request an in-person assessment or other available alternative prior to the telemedicine visit and am voluntarily participating in the telemedicine visit.  I understand that I have the right to withhold or withdraw my consent to the use of telemedicine in the course of my care at any time, without affecting my right to future care or treatment, and that the Practitioner or I may terminate the telemedicine visit at any time. I understand that I have the right to inspect all information obtained and/or recorded in the course of the telemedicine visit and may receive copies of available information for a reasonable fee.  I understand that some of the potential risks of receiving the Services via telemedicine include:  Delay or interruption in medical evaluation due to technological equipment failure or  disruption; Information transmitted may not be sufficient (e.g. poor resolution of images) to allow for appropriate medical decision making by the Practitioner; and/or  In rare instances, security protocols could fail, causing a breach of personal health information.  Furthermore, I acknowledge that it is my responsibility to provide information about my medical history, conditions and care that is complete and accurate to the best of my ability. I acknowledge that Practitioner's advice, recommendations, and/or decision may be based on factors not within their control, such as incomplete or inaccurate data provided by me or distortions of diagnostic images or specimens that may result from electronic transmissions. I understand that the practice of medicine is not an exact science and that Practitioner makes no warranties or guarantees regarding treatment outcomes. I acknowledge that a copy of this consent can be made available to me via my patient portal [facility] MyChart), or I can request a printed copy by calling the office of [facility].    I understand that my insurance will be billed for this visit.   I have read or had this consent read to me. I understand the contents of this consent, which adequately explains the benefits and risks of the Services being provided via telemedicine.  I have been provided ample opportunity to ask questions regarding this consent and the Services and have had my questions answered to my satisfaction. I give my informed consent for the services to be provided through the use of telemedicine in my medical care

## 2023-06-25 ENCOUNTER — Ambulatory Visit (INDEPENDENT_AMBULATORY_CARE_PROVIDER_SITE_OTHER): Payer: Medicare Other | Admitting: Nurse Practitioner

## 2023-06-25 ENCOUNTER — Encounter: Payer: Self-pay | Admitting: Nurse Practitioner

## 2023-06-25 VITALS — BP 120/70 | Temp 99.4°F

## 2023-06-25 DIAGNOSIS — R519 Headache, unspecified: Secondary | ICD-10-CM | POA: Diagnosis not present

## 2023-06-25 DIAGNOSIS — Z20822 Contact with and (suspected) exposure to covid-19: Secondary | ICD-10-CM | POA: Diagnosis not present

## 2023-06-25 DIAGNOSIS — R531 Weakness: Secondary | ICD-10-CM | POA: Diagnosis not present

## 2023-06-25 LAB — POC SOFIA 2 FLU + SARS ANTIGEN FIA
Influenza A, POC: NEGATIVE
Influenza B, POC: NEGATIVE
SARS Coronavirus 2 Ag: NEGATIVE

## 2023-06-25 MED ORDER — AZITHROMYCIN 250 MG PO TABS
ORAL_TABLET | ORAL | 0 refills | Status: AC
Start: 2023-06-25 — End: 2023-06-30

## 2023-06-25 NOTE — Patient Instructions (Signed)
You can take tylenol as needed for headache.

## 2023-06-25 NOTE — Progress Notes (Signed)
Madelaine Bhat, CMA,acting as a Neurosurgeon for Arnette Felts, FNP.,have documented all relevant documentation on the behalf of Arnette Felts, FNP,as directed by  Arnette Felts, FNP while in the presence of Arnette Felts, FNP.  Subjective:  Patient ID: Wayne [name] , male    DOB: Nov 06, 1926 , 89 y.o.   MRN: [id number]  Chief Complaint  Patient presents with   Covid Exposure    HPI  Patient presents today for headache, weakness, sweating, runny nose, feeling nervous and no energy for the past 5 days (last Thursday).  He has been able to eat and drink. The caregiver that comes and cleans the house was positive for covid. She was at his house the Tuesday before. He did not take a covid test prior to his visit. He has not taken any medications. He is still able to eat and drink.      Past Medical History:  Diagnosis Date   Arthritis    Cataract    Glaucoma    Hypertension      Family History  Problem Relation Age of Onset   Dementia Mother    Cancer Father      Current Outpatient Medications:    azithromycin (ZITHROMAX) 250 MG tablet, Take 2 tablets (500 mg) on  Day 1,  followed by 1 tablet (250 mg) once daily on Days 2 through 5., Disp: 6 each, Rfl: 0   amLODipine (NORVASC) 5 MG tablet, Take 1 tablet (5 mg total) by mouth daily., Disp: 90 tablet, Rfl: 2   aspirin EC 81 MG tablet, Take 81 mg by mouth daily., Disp: , Rfl:    atorvastatin (LIPITOR) 20 MG tablet, TAKE M,W,F ONLY., Disp: 90 tablet, Rfl: 2   B Complex-C (B-COMPLEX WITH VITAMIN C) tablet, Take 1 tablet by mouth daily., Disp: , Rfl:    Cholecalciferol (VITAMIN D3) 125 MCG (5000 UT) CAPS, Take by mouth daily. Monday, Wednesday, Friday, Disp: , Rfl:    diclofenac Sodium (VOLTAREN) 1 % GEL, Apply 2 g topically 4 (four) times daily. (Patient not taking: Reported on 04/16/2023), Disp: 150 g, Rfl: 1   fluocinonide cream (LIDEX) 0.05 %, Apply 1 application. topically 2 (two) times daily., Disp: 120 g, Rfl: 2   furosemide (LASIX) 40  MG tablet, Take 40 mg by mouth daily., Disp: , Rfl:    gabapentin (NEURONTIN) 300 MG capsule, Take 1 capsule (300 mg total) by mouth 2 (two) times daily., Disp: 200 capsule, Rfl: 2   ketoconazole (NIZORAL) 2 % cream, Apply 1 application topically daily., Disp: 15 g, Rfl: 1   lidocaine (LIDODERM) 5 %, Place 1 patch onto the skin daily. Remove & Discard patch within 12 hours or as directed by MD, Disp: 30 patch, Rfl: 0   loratadine (CLARITIN) 10 MG tablet, Take 1 tablet (10 mg total) by mouth daily., Disp: 90 tablet, Rfl: 2   Multiple Vitamin (MULTIVITAMIN WITH MINERALS) TABS tablet, Take 1 tablet by mouth daily., Disp: , Rfl:    NON FORMULARY, CBD gummies, Disp: , Rfl:    Omega-3 Fatty Acids (FISH OIL) 1200 MG CAPS, Take 1 capsule by mouth daily. , Disp: , Rfl:    oxyCODONE-acetaminophen (PERCOCET/ROXICET) 5-325 MG tablet, Take 1-2 tablets by mouth daily as needed for pain. , Disp: , Rfl: 0   Saw Palmetto 450 MG CAPS, Take 2 capsules by mouth daily., Disp: , Rfl:    senna (SENOKOT) 8.6 MG tablet, Take 1 tablet by mouth daily., Disp: , Rfl:    tamsulosin (  FLOMAX) 0.4 MG CAPS capsule, Take 0.4 mg by mouth daily. , Disp: , Rfl:    timolol (BETIMOL) 0.5 % ophthalmic solution, Place 1 drop into both eyes daily. , Disp: , Rfl:    valsartan-hydrochlorothiazide (DIOVAN-HCT) 80-12.5 MG tablet, Take 1 tablet by mouth daily., Disp: 90 tablet, Rfl: 2   Allergies  Allergen Reactions   Cephalexin     Severe mouth and throat dryness.     Review of Systems  Constitutional: Negative.   HENT: Negative.    Eyes: Negative.   Respiratory: Negative.    Cardiovascular: Negative.   Gastrointestinal: Negative.   Neurological: Negative.   Psychiatric/Behavioral: Negative.       Today's Vitals   06/25/23 0912  BP: 120/70  Temp: 99.4 F (37.4 C)   There is no height or weight on file to calculate BMI.  Wt Readings from Last 3 Encounters:  04/16/23 150 lb 12.8 oz (68.4 kg)  03/12/23 153 lb 12.8 oz (69.8  kg)  03/05/23 150 lb (68 kg)    The ASCVD Risk score (Arnett DK, et al., 2019) failed to calculate for the following reasons:   The 2019 ASCVD risk score is only valid for ages 57 to 67  Objective:  Physical Exam Vitals reviewed.  Constitutional:      General: He is not in acute distress.    Appearance: Normal appearance. He is ill-appearing (appears fatigued and weak).  Cardiovascular:     Rate and Rhythm: Normal rate and regular rhythm.     Pulses: Normal pulses.     Heart sounds: Normal heart sounds. No murmur heard. Pulmonary:     Effort: Pulmonary effort is normal. No respiratory distress.     Breath sounds: Normal breath sounds. No wheezing.  Skin:    General: Skin is warm and dry.     Capillary Refill: Capillary refill takes less than 2 seconds.  Neurological:     General: No focal deficit present.     Mental Status: He is alert and oriented to person, place, and time.     Cranial Nerves: No cranial nerve deficit.  Psychiatric:        Mood and Affect: Mood normal.        Behavior: Behavior normal.        Thought Content: Thought content normal.        Judgment: Judgment normal.         Assessment And Plan:  Nonintractable headache, unspecified chronicity pattern, unspecified headache type Assessment & Plan: Can take tylenol as needed. Encouraged to stay well hydrated with water  Orders: -     POC SOFIA 2 FLU + SARS ANTIGEN FIA -     BMP8+eGFR -     TSH -     Azithromycin; Take 2 tablets (500 mg) on  Day 1,  followed by 1 tablet (250 mg) once daily on Days 2 through 5.  Dispense: 6 each; Refill: 0  Weakness Assessment & Plan: Will check for any infection or metabolic cause.   Orders: -     POC SOFIA 2 FLU + SARS ANTIGEN FIA -     TSH -     Azithromycin; Take 2 tablets (500 mg) on  Day 1,  followed by 1 tablet (250 mg) once daily on Days 2 through 5.  Dispense: 6 each; Refill: 0 -     Vitamin B12  Close exposure to COVID-19 virus Assessment &  Plan: Exposed last Tuesday symptoms began on Thursday but  did not test. Rapid covid is negative will send PCR.   Orders: -     CBC with Differential/Platelet    Return if symptoms worsen or fail to improve.   Patient was given opportunity to ask questions. Patient verbalized understanding of the plan and was able to repeat key elements of the plan. All questions were answered to their satisfaction.    Jeanell Sparrow, FNP, have reviewed all documentation for this visit. The documentation on 06/25/23 for the exam, diagnosis, procedures, and orders are all accurate and complete.   IF YOU HAVE BEEN REFERRED TO A SPECIALIST, IT MAY TAKE 1-2 WEEKS TO SCHEDULE/PROCESS THE REFERRAL. IF YOU HAVE NOT HEARD FROM US/SPECIALIST IN TWO WEEKS, PLEASE GIVE [place] A CALL AT [phone].

## 2023-06-25 NOTE — Assessment & Plan Note (Signed)
Exposed last Tuesday symptoms began on Thursday but did not test. Rapid covid is negative will send PCR.

## 2023-06-25 NOTE — Assessment & Plan Note (Signed)
Can take tylenol as needed. Encouraged to stay well hydrated with water

## 2023-06-25 NOTE — Assessment & Plan Note (Signed)
Will check for any infection or metabolic cause.

## 2023-06-26 ENCOUNTER — Ambulatory Visit: Payer: Medicare Other | Admitting: Nurse Practitioner

## 2023-06-26 LAB — BMP8+EGFR
BUN/Creatinine Ratio: 22 (ref 10–24)
BUN: 41 mg/dL — ABNORMAL HIGH (ref 10–36)
CO2: 27 mmol/L (ref 20–29)
Calcium: 9.3 mg/dL (ref 8.6–10.2)
Chloride: 102 mmol/L (ref 96–106)
Creatinine, Ser: 1.85 mg/dL — ABNORMAL HIGH (ref 0.76–1.27)
Glucose: 133 mg/dL — ABNORMAL HIGH (ref 70–99)
Potassium: 4.7 mmol/L (ref 3.5–5.2)
Sodium: 142 mmol/L (ref 134–144)
eGFR: 33 mL/min/{1.73_m2} — ABNORMAL LOW (ref >59)

## 2023-06-26 LAB — CBC WITH DIFFERENTIAL/PLATELET
Basophils Absolute: 0 10*3/uL (ref 0.0–0.2)
Basos: 1 %
EOS (ABSOLUTE): 0.1 10*3/uL (ref 0.0–0.4)
Eos: 3 %
Hematocrit: 36.7 % — ABNORMAL LOW (ref 37.5–51.0)
Hemoglobin: 12.1 g/dL — ABNORMAL LOW (ref 13.0–17.7)
Immature Grans (Abs): 0 10*3/uL (ref 0.0–0.1)
Immature Granulocytes: 0 %
Lymphocytes Absolute: 1.2 10*3/uL (ref 0.7–3.1)
Lymphs: 23 %
MCH: 33.5 pg — ABNORMAL HIGH (ref 26.6–33.0)
MCHC: 33 g/dL (ref 31.5–35.7)
MCV: 102 fL — ABNORMAL HIGH (ref 79–97)
Monocytes Absolute: 0.5 10*3/uL (ref 0.1–0.9)
Monocytes: 10 %
Neutrophils Absolute: 3.2 10*3/uL (ref 1.4–7.0)
Neutrophils: 63 %
Platelets: 157 10*3/uL (ref 150–450)
RBC: 3.61 x10E6/uL — ABNORMAL LOW (ref 4.14–5.80)
RDW: 13.2 % (ref 11.6–15.4)
WBC: 5.1 10*3/uL (ref 3.4–10.8)

## 2023-06-26 LAB — NOVEL CORONAVIRUS, NAA: SARS-CoV-2, NAA: NOT DETECTED

## 2023-06-26 LAB — VITAMIN B12: Vitamin B-12: 1454 pg/mL — ABNORMAL HIGH (ref 232–1245)

## 2023-06-26 LAB — TSH: TSH: 1.58 u[IU]/mL (ref 0.450–4.500)

## 2023-06-27 LAB — NOVEL CORONAVIRUS, NAA: SARS-CoV-2, NAA: NOT DETECTED

## 2023-06-28 ENCOUNTER — Encounter: Payer: Self-pay | Admitting: Internal Medicine

## 2023-06-28 NOTE — Telephone Encounter (Signed)
Error

## 2023-07-08 DIAGNOSIS — M19072 Primary osteoarthritis, left ankle and foot: Secondary | ICD-10-CM | POA: Diagnosis not present

## 2023-07-08 DIAGNOSIS — M19071 Primary osteoarthritis, right ankle and foot: Secondary | ICD-10-CM | POA: Diagnosis not present

## 2023-07-18 DIAGNOSIS — D631 Anemia in chronic kidney disease: Secondary | ICD-10-CM | POA: Diagnosis not present

## 2023-07-18 DIAGNOSIS — R809 Proteinuria, unspecified: Secondary | ICD-10-CM | POA: Diagnosis not present

## 2023-07-18 DIAGNOSIS — N2581 Secondary hyperparathyroidism of renal origin: Secondary | ICD-10-CM | POA: Diagnosis not present

## 2023-07-18 DIAGNOSIS — N261 Atrophy of kidney (terminal): Secondary | ICD-10-CM | POA: Diagnosis not present

## 2023-07-18 DIAGNOSIS — N1832 Chronic kidney disease, stage 3b: Secondary | ICD-10-CM | POA: Diagnosis not present

## 2023-07-19 LAB — LAB REPORT - SCANNED
Albumin, Urine POC: 35.8
Albumin/Creatinine Ratio, Urine, POC: 76
Creatinine, POC: 47.2 mg/dL
EGFR: 39

## 2023-07-22 ENCOUNTER — Ambulatory Visit: Payer: Medicare Other | Attending: Cardiology

## 2023-07-22 DIAGNOSIS — Z01810 Encounter for preprocedural cardiovascular examination: Secondary | ICD-10-CM | POA: Diagnosis not present

## 2023-07-22 NOTE — Progress Notes (Signed)
Virtual Visit via Telephone Note   Because of Wayne [name]'s co-morbid illnesses, he is at least at moderate risk for complications without adequate follow up.  This format is felt to be most appropriate for this patient at this time.  The patient did not have access to video technology/had technical difficulties with video requiring transitioning to audio format only (telephone).  All issues noted in this document were discussed and addressed.  No physical exam could be performed with this format.  Please refer to the patient's chart for his consent to telehealth for [facility].  Evaluation Performed:  Preoperative cardiovascular risk assessment _____________   Date:  07/22/2023   Patient ID:  Wayne [name], DOB Dec 23, 1925, MRN [id number] Patient Location:  Home Provider location:   Office  Primary Care Provider:  Dorothyann Peng, MD Primary Cardiologist:  Lance Muss, MD  Chief Complaint / Patient Profile   89 y.o. y/o male with a h/o bradycardia, RBBB, hypertension who is pending hernia repair and presents today for telephonic preoperative cardiovascular risk assessment.  History of Present Illness    Wayne [name] is a 89 y.o. male who presents via audio/video conferencing for a telehealth visit today.  Pt was last seen in cardiology clinic on 04/16/2023 by Dr.Varanasi.  At that time Wayne [name] was doing well .  The patient is now pending procedure as outlined above. Since his last visit, he  remains stable from a cardiac standpoint.  Today he denies chest pain, shortness of breath, lower extremity edema, fatigue, palpitations, melena, hematuria, hemoptysis, diaphoresis, weakness, presyncope, syncope, orthopnea, and PND.   Past Medical History    Past Medical History:  Diagnosis Date   Arthritis    Cataract    Glaucoma    Hypertension    Past Surgical History:  Procedure Laterality Date   APPENDECTOMY     BACK SURGERY     ballon  angioplasty  03/18/2012   FOOT SURGERY     JOINT REPLACEMENT     kidney stent Left 2015   KNEE SURGERY Left x 2    left ankle     LEFT HEART CATHETERIZATION WITH CORONARY ANGIOGRAM N/A 03/18/2012   Procedure: LEFT HEART CATHETERIZATION WITH CORONARY ANGIOGRAM;  Surgeon: Pamella Pert, MD;  Location: [facility] CATH LAB;  Service: Cardiovascular;  Laterality: N/A;   RENAL ANGIOGRAM N/A 03/18/2012   Procedure: RENAL ANGIOGRAM;  Surgeon: Pamella Pert, MD;  Location: [facility] CATH LAB;  Service: Cardiovascular;  Laterality: N/A;   right wrist     SPINE SURGERY      Allergies  Allergies  Allergen Reactions   Cephalexin     Severe mouth and throat dryness.    Home Medications    Prior to Admission medications   Medication Sig Start Date End Date Taking? Authorizing Provider  amLODipine (NORVASC) 5 MG tablet Take 1 tablet (5 mg total) by mouth daily. 04/16/23   Dorothyann Peng, MD  aspirin EC 81 MG tablet Take 81 mg by mouth daily.    [provider]  atorvastatin (LIPITOR) 20 MG tablet TAKE M,W,F ONLY. 03/21/23   Dorothyann Peng, MD  B Complex-C (B-COMPLEX WITH VITAMIN C) tablet Take 1 tablet by mouth daily.    [provider]  Cholecalciferol (VITAMIN D3) 125 MCG (5000 UT) CAPS Take by mouth daily. Monday, Wednesday, Friday    [provider]  diclofenac Sodium (VOLTAREN) 1 % GEL Apply 2 g topically 4 (four) times daily. Patient not taking:  Reported on 04/16/2023 03/13/21   Dorothyann Peng, MD  fluocinonide cream (LIDEX) 0.05 % Apply 1 application. topically 2 (two) times daily. 03/14/22   Dorothyann Peng, MD  furosemide (LASIX) 40 MG tablet Take 40 mg by mouth daily. 01/05/22   [provider]  gabapentin (NEURONTIN) 300 MG capsule Take 1 capsule (300 mg total) by mouth 2 (two) times daily. 05/16/23   Dorothyann Peng, MD  ketoconazole (NIZORAL) 2 % cream Apply 1 application topically daily. 08/16/21   Dorothyann Peng, MD  lidocaine (LIDODERM) 5 % Place 1 patch onto the  skin daily. Remove & Discard patch within 12 hours or as directed by MD 03/07/23   Dorothyann Peng, MD  loratadine (CLARITIN) 10 MG tablet Take 1 tablet (10 mg total) by mouth daily. 03/31/19 12/26/22  Dorothyann Peng, MD  Multiple Vitamin (MULTIVITAMIN WITH MINERALS) TABS tablet Take 1 tablet by mouth daily.    [provider]  NON FORMULARY CBD gummies    [provider]  Omega-3 Fatty Acids (FISH OIL) 1200 MG CAPS Take 1 capsule by mouth daily.     [provider]  oxyCODONE-acetaminophen (PERCOCET/ROXICET) 5-325 MG tablet Take 1-2 tablets by mouth daily as needed for pain.  07/18/17   [provider]  Saw Palmetto 450 MG CAPS Take 2 capsules by mouth daily.    [provider]  senna (SENOKOT) 8.6 MG tablet Take 1 tablet by mouth daily.    [provider]  tamsulosin (FLOMAX) 0.4 MG CAPS capsule Take 0.4 mg by mouth daily.  05/15/19   [provider]  timolol (BETIMOL) 0.5 % ophthalmic solution Place 1 drop into both eyes daily.     [provider]  valsartan-hydrochlorothiazide (DIOVAN-HCT) 80-12.5 MG tablet Take 1 tablet by mouth daily. 11/22/21   Charlesetta Ivory, NP    Physical Exam    Vital Signs:  Charolette Child does not have vital signs available for review today.  Given telephonic nature of communication, physical exam is limited. AAOx3. NAD. Normal affect.  Speech and respirations are unlabored.  Accessory Clinical Findings    None  Assessment & Plan    1.  Preoperative Cardiovascular Risk Assessment: Hernia repair, Dr. Magnus Ivan, [place] surgery, fax #[phone]      Primary Cardiologist: Lance Muss, MD  Chart reviewed as part of pre-operative protocol coverage. Given past medical history and time since last visit, based on ACC/AHA guidelines, Wayne [name] would be at acceptable risk for the planned procedure without further cardiovascular testing.   His RCRI is a class I risk, 0.4%  risk of major cardiac event.  He is able to complete greater than 4 METS of physical activity.  Patient was advised that if he develops new symptoms prior to surgery to contact our office to arrange a follow-up appointment.  He verbalized understanding.  Patient's aspirin is not prescribed by cardiology. Recommendations for holding aspirin will need to come from prescribing provider.   I will route this recommendation to the requesting party via Epic fax function and remove from pre-op pool.  Please call with questions.      Time:   Today, I have spent 5 minutes with the patient with telehealth technology discussing medical history, symptoms, and management plan.  Prior to his phone evaluation I spent greater than 10 minutes reviewing his past medical history and cardiac medications.   Ronney Asters, NP  07/22/2023, 7:31 AM

## 2023-07-25 ENCOUNTER — Ambulatory Visit (HOSPITAL_COMMUNITY)
Admission: EM | Admit: 2023-07-25 | Discharge: 2023-07-25 | Disposition: A | Discharge status: Home / Self Care | Payer: Medicare Other | Attending: Internal Medicine | Admitting: Internal Medicine

## 2023-07-25 ENCOUNTER — Encounter (HOSPITAL_COMMUNITY): Payer: Self-pay

## 2023-07-25 DIAGNOSIS — M25512 Pain in left shoulder: Secondary | ICD-10-CM

## 2023-07-25 DIAGNOSIS — M7022 Olecranon bursitis, left elbow: Secondary | ICD-10-CM | POA: Diagnosis not present

## 2023-07-25 MED ORDER — PREDNISONE 20 MG PO TABS: 20.0000 mg | ORAL_TABLET | Freq: Every day | ORAL | 0 refills | Status: AC

## 2023-07-25 NOTE — ED Triage Notes (Signed)
Lt Shoulder/Elbow Pain no falls or known injuries. Has history of arthritis and bursitis.   Onset of current flare 3 weeks ago.

## 2023-07-25 NOTE — Discharge Instructions (Addendum)
You have bursitis of the left elbow. Take prednisone 20mg  every day in the morning with breakfast. Apply ice to the elbow and shoulder to reduce inflammation 20 minutes on 20 minutes off as needed.  Schedule an appointment with the orthopedic provider listed on your paperwork for follow-up in the next 2-3 days.  If you develop any new or worsening symptoms or if your symptoms do not start to improve, please return here or follow-up with your primary care provider. If your symptoms are severe, please go to the emergency room.

## 2023-07-25 NOTE — ED Provider Notes (Signed)
[facility]    CSN: [id number] Arrival date & time: 07/25/23  1215      History   Chief Complaint Chief Complaint  Patient presents with   Shoulder Pain   Elbow Pain    HPI Wayne [name] is a 89 y.o. male.   Patient presents to urgent care with his family member who contributes to the history for evaluation of tenderness to the left elbow/left shoulder joint that started 3 weeks ago. 1 week ago, the left elbow became swollen. Pain is worse with movement of the left arm at the shoulder and elbow joints. No erythema, warmth, or recent fever/chills. He has not had any recent trauma/injuries to the left elbow or shoulder. History of bursitis to the left elbow/shoulder, states this feels similar. Denies chest pain, shortness of breath, and paresthesias or weakness to the bilateral upper extremities. He has not attempted use of any OTC medications to help with symptoms for pain. He has chronic pain to the bilateral feet for which he takes oxycodone prescribed by pain clinic, pain of the left shoulder/elbow has not responded well to oxycodone.    Shoulder Pain   Past Medical History:  Diagnosis Date   Arthritis    Cataract    Glaucoma    Hypertension     Patient Active Problem List   Diagnosis Date Noted   Nonintractable headache 06/25/2023   Close exposure to COVID-19 virus 06/25/2023   Weakness 06/25/2023   Generalized osteoarthritis of multiple sites 07/24/2021   Hypertensive heart and renal disease 07/24/2021   Overweight with body mass index (BMI) of 27 to 27.9 in adult 07/24/2021   Other chronic pain 07/24/2021   Renal artery atherosclerosis (HCC) 12/30/2019   Viral upper respiratory tract infection 03/09/2019   Hearing loss due to cerumen impaction, bilateral 12/04/2018   Acute back pain 06/10/2018   Spondylosis of lumbar region without myelopathy or radiculopathy 06/10/2018   Carpal tunnel syndrome of left wrist 12/12/2017   Hammertoe 07/01/2016    Anemia of chronic renal failure, stage 3 (moderate) (HCC) 11/23/2015   Dehydration 11/22/2015   CKD (chronic kidney disease) stage 3, GFR 30-59 ml/min (HCC) 11/22/2015   Sepsis (HCC) 11/22/2015   Diarrhea 11/22/2015   Leukopenia 11/22/2015   Benign essential HTN 11/22/2015   Coronary artery disease involving native coronary artery without angina pectoris 03/18/2012   Hyperlipidemia 03/18/2012    Past Surgical History:  Procedure Laterality Date   APPENDECTOMY     BACK SURGERY     ballon angioplasty  03/18/2012   FOOT SURGERY     JOINT REPLACEMENT     kidney stent Left 2015   KNEE SURGERY Left x 2    left ankle     LEFT HEART CATHETERIZATION WITH CORONARY ANGIOGRAM N/A 03/18/2012   Procedure: LEFT HEART CATHETERIZATION WITH CORONARY ANGIOGRAM;  Surgeon: Pamella Pert, MD;  Location: [facility] CATH LAB;  Service: Cardiovascular;  Laterality: N/A;   RENAL ANGIOGRAM N/A 03/18/2012   Procedure: RENAL ANGIOGRAM;  Surgeon: Pamella Pert, MD;  Location: [facility] CATH LAB;  Service: Cardiovascular;  Laterality: N/A;   right wrist     SPINE SURGERY         Home Medications    Prior to Admission medications   Medication Sig Start Date End Date Taking? Authorizing Provider  amLODipine (NORVASC) 5 MG tablet Take 1 tablet (5 mg total) by mouth daily. 04/16/23  Yes Dorothyann Peng, MD  aspirin EC 81 MG tablet Take 81 mg  by mouth daily.   Yes [provider]  atorvastatin (LIPITOR) 20 MG tablet TAKE M,W,F ONLY. 03/21/23  Yes Dorothyann Peng, MD  B Complex-C (B-COMPLEX WITH VITAMIN C) tablet Take 1 tablet by mouth daily.   Yes [provider]  Cholecalciferol (VITAMIN D3) 125 MCG (5000 UT) CAPS Take by mouth daily. Monday, Wednesday, Friday   Yes [provider]  diclofenac Sodium (VOLTAREN) 1 % GEL Apply 2 g topically 4 (four) times daily. 03/13/21  Yes Dorothyann Peng, MD  fluocinonide cream (LIDEX) 0.05 % Apply 1 application. topically 2 (two) times daily. 03/14/22  Yes  Dorothyann Peng, MD  furosemide (LASIX) 40 MG tablet Take 40 mg by mouth daily. 01/05/22  Yes [provider]  gabapentin (NEURONTIN) 300 MG capsule Take 1 capsule (300 mg total) by mouth 2 (two) times daily. 05/16/23  Yes Dorothyann Peng, MD  ketoconazole (NIZORAL) 2 % cream Apply 1 application topically daily. 08/16/21  Yes Dorothyann Peng, MD  lidocaine (LIDODERM) 5 % Place 1 patch onto the skin daily. Remove & Discard patch within 12 hours or as directed by MD 03/07/23  Yes Dorothyann Peng, MD  Multiple Vitamin (MULTIVITAMIN WITH MINERALS) TABS tablet Take 1 tablet by mouth daily.   Yes [provider]  NON FORMULARY CBD gummies   Yes [provider]  Omega-3 Fatty Acids (FISH OIL) 1200 MG CAPS Take 1 capsule by mouth daily.    Yes [provider]  oxyCODONE-acetaminophen (PERCOCET/ROXICET) 5-325 MG tablet Take 1-2 tablets by mouth daily as needed for pain.  07/18/17  Yes [provider]  predniSONE (DELTASONE) 20 MG tablet Take 1 tablet (20 mg total) by mouth daily for 5 days. 07/25/23 07/30/23 Yes Kamera Dubas, Donavan Burnet, FNP  Saw Palmetto 450 MG CAPS Take 2 capsules by mouth daily.   Yes [provider]  senna (SENOKOT) 8.6 MG tablet Take 1 tablet by mouth daily.   Yes [provider]  tamsulosin (FLOMAX) 0.4 MG CAPS capsule Take 0.4 mg by mouth daily.  05/15/19  Yes [provider]  timolol (BETIMOL) 0.5 % ophthalmic solution Place 1 drop into both eyes daily.    Yes [provider]  valsartan-hydrochlorothiazide (DIOVAN-HCT) 80-12.5 MG tablet Take 1 tablet by mouth daily. 11/22/21  Yes Ghumman, Ramandeep, NP  loratadine (CLARITIN) 10 MG tablet Take 1 tablet (10 mg total) by mouth daily. 03/31/19 12/26/22  Dorothyann Peng, MD    Family History Family History  Problem Relation Age of Onset   Dementia Mother    Cancer Father     Social History Social History   Tobacco Use   Smoking status: Former    Current packs/day:  0.00    Average packs/day: 0.3 packs/day for 3.0 years (0.8 ttl pk-yrs)    Types: Cigarettes    Start date: 06/02/1969    Quit date: 06/02/1972    Years since quitting: 51.1   Smokeless tobacco: Never  Vaping Use   Vaping status: Never Used  Substance Use Topics   Alcohol use: Not Currently   Drug use: Yes    Types: Oxycodone     Allergies   Cephalexin   Review of Systems Review of Systems Per HPI  Physical Exam Triage Vital Signs ED Triage Vitals  Encounter Vitals Group     BP 07/25/23 1252 139/72     Systolic BP Percentile --      Diastolic BP Percentile --      Pulse Rate 07/25/23 1252 (!) 52  Resp 07/25/23 1252 18     Temp 07/25/23 1252 97.7 F (36.5 C)     Temp Source 07/25/23 1252 Oral     SpO2 07/25/23 1252 94 %     Weight 07/25/23 1252 150 lb 12.7 oz (68.4 kg)     Height 07/25/23 1252 5' 3.5" (1.613 m)     Head Circumference --      Peak Flow --      Pain Score 07/25/23 1250 10     Pain Loc --      Pain Education --      Exclude from Growth Chart --    No data found.  Updated Vital Signs BP 139/72 (BP Location: Left Arm)   Pulse (!) 52   Temp 97.7 F (36.5 C) (Oral)   Resp 18   Ht 5' 3.5" (1.613 m)   Wt 150 lb 12.7 oz (68.4 kg)   SpO2 94%   BMI 26.29 kg/m   Visual Acuity Right Eye Distance:   Left Eye Distance:   Bilateral Distance:    Right Eye Near:   Left Eye Near:    Bilateral Near:     Physical Exam Vitals and nursing note reviewed.  Constitutional:      Appearance: He is not ill-appearing or toxic-appearing.  HENT:     Head: Normocephalic and atraumatic.     Right Ear: Hearing and external ear normal.     Left Ear: Hearing and external ear normal.     Nose: Nose normal.     Mouth/Throat:     Lips: Pink.  Eyes:     General: Lids are normal. Vision grossly intact. Gaze aligned appropriately.     Extraocular Movements: Extraocular movements intact.     Conjunctiva/sclera: Conjunctivae normal.  Pulmonary:     Effort:  Pulmonary effort is normal.  Musculoskeletal:     Right shoulder: Normal.     Left shoulder: Tenderness (Diffuse TTP to the left shoulder joint without overlying rash or erythema/warmth) present. No swelling, deformity, effusion, laceration, bony tenderness or crepitus. Decreased range of motion (Secondary to pain). Normal strength. Normal pulse.     Left elbow: Swelling (3cm fluctuant well demarcated area of swelling approximately 4cm in diameter) present. No deformity, effusion or lacerations. Normal range of motion. Tenderness present in olecranon process.     Cervical back: Neck supple.     Comments: Strength and sensation intact to bilateral upper extremities. 5/5 grip strength. +2 bilateral radial pulses. No erythema, drainage, warmth.   Skin:    General: Skin is warm and dry.     Capillary Refill: Capillary refill takes less than 2 seconds.     Findings: No rash.  Neurological:     General: No focal deficit present.     Mental Status: He is alert and oriented to person, place, and time. Mental status is at baseline.     Cranial Nerves: No dysarthria or facial asymmetry.  Psychiatric:        Mood and Affect: Mood normal.        Speech: Speech normal.        Behavior: Behavior normal.        Thought Content: Thought content normal.        Judgment: Judgment normal.      UC Treatments / Results  Labs (all labs ordered are listed, but only abnormal results are displayed) Labs Reviewed - No data to display  EKG   Radiology No results found.  Procedures  Procedures (including critical care time)  Medications Ordered in UC Medications - No data to display  Initial Impression / Assessment and Plan / UC Course  I have reviewed the triage vital signs and the nursing notes.  Pertinent labs & imaging results that were available during my care of the patient were reviewed by me and considered in my medical decision making (see chart for details).   1. Olecranon bursitis of  left elbow, left shoulder pain Presentation consistent with olecranon bursitis of the left elbow, likely bursitis of the left shoulder as well.  Reviewed most recent blood work BMP 06/25/23 showing stable renal function. Prednisone 20mg  daily for 5 days to reduce inflammation and pain. Ice 20 minutes on 20 minutes off as needed to reduce swelling and inflammation. He has an orthopedic provider Johney Frame) and plans on scheduling an appointment in the next 2-3 days to be seen for follow-up. Low suspicion for acute gouty arthritis/septic joint.   Counseled patient on potential for adverse effects with medications prescribed/recommended today, strict ER and return-to-clinic precautions discussed, patient verbalized understanding.   Final Clinical Impressions(s) / UC Diagnoses   Final diagnoses:  Olecranon bursitis of left elbow  Acute pain of left shoulder     Discharge Instructions      You have bursitis of the left elbow. Take prednisone 20mg  every day in the morning with breakfast. Apply ice to the elbow and shoulder to reduce inflammation 20 minutes on 20 minutes off as needed.  Schedule an appointment with the orthopedic provider listed on your paperwork for follow-up in the next 2-3 days.  If you develop any new or worsening symptoms or if your symptoms do not start to improve, please return here or follow-up with your primary care provider. If your symptoms are severe, please go to the emergency room.    ED Prescriptions     Medication Sig Dispense Auth. Provider   predniSONE (DELTASONE) 20 MG tablet Take 1 tablet (20 mg total) by mouth daily for 5 days. 5 tablet Carlisle Beers, FNP      PDMP not reviewed this encounter.   Carlisle Beers, [place] 07/25/23 1416

## 2023-07-29 DIAGNOSIS — M25522 Pain in left elbow: Secondary | ICD-10-CM | POA: Diagnosis not present

## 2023-07-29 DIAGNOSIS — M25512 Pain in left shoulder: Secondary | ICD-10-CM | POA: Diagnosis not present

## 2023-08-07 DIAGNOSIS — M25571 Pain in right ankle and joints of right foot: Secondary | ICD-10-CM | POA: Diagnosis not present

## 2023-08-07 DIAGNOSIS — M25572 Pain in left ankle and joints of left foot: Secondary | ICD-10-CM | POA: Diagnosis not present

## 2023-08-27 DIAGNOSIS — M216X2 Other acquired deformities of left foot: Secondary | ICD-10-CM | POA: Diagnosis not present

## 2023-08-27 DIAGNOSIS — M216X1 Other acquired deformities of right foot: Secondary | ICD-10-CM | POA: Diagnosis not present

## 2023-09-12 ENCOUNTER — Ambulatory Visit: Payer: Medicare Other | Admitting: Internal Medicine

## 2023-09-12 ENCOUNTER — Encounter: Payer: Self-pay | Admitting: Internal Medicine

## 2023-09-12 VITALS — BP 122/70 | HR 70 | Temp 98.5°F | Ht 63.0 in | Wt 152.4 lb

## 2023-09-12 DIAGNOSIS — N2581 Secondary hyperparathyroidism of renal origin: Secondary | ICD-10-CM | POA: Insufficient documentation

## 2023-09-12 DIAGNOSIS — N1832 Chronic kidney disease, stage 3b: Secondary | ICD-10-CM

## 2023-09-12 DIAGNOSIS — I1310 Hypertensive heart and chronic kidney disease without heart failure, with stage 1 through stage 4 chronic kidney disease, or unspecified chronic kidney disease: Secondary | ICD-10-CM | POA: Diagnosis not present

## 2023-09-12 DIAGNOSIS — I701 Atherosclerosis of renal artery: Secondary | ICD-10-CM

## 2023-09-12 DIAGNOSIS — Z23 Encounter for immunization: Secondary | ICD-10-CM | POA: Diagnosis not present

## 2023-09-12 MED ORDER — ATORVASTATIN CALCIUM 20 MG PO TABS: ORAL_TABLET | ORAL | 2 refills | Status: AC

## 2023-09-12 MED ORDER — AMLODIPINE BESYLATE 5 MG PO TABS: 5.0000 mg | ORAL_TABLET | Freq: Every day | ORAL | 2 refills | Status: DC

## 2023-09-12 MED ORDER — VALSARTAN-HYDROCHLOROTHIAZIDE 80-12.5 MG PO TABS: 1.0000 | ORAL_TABLET | Freq: Every day | ORAL | 2 refills | Status: DC

## 2023-09-12 NOTE — Progress Notes (Signed)
I,[name], CMA,acting as a Neurosurgeon for [name], MD.,have documented all relevant documentation on the behalf of [name], MD,as directed by  [name], MD while in the presence of [name], MD.  Subjective:  Patient ID: Wayne [name] , male    DOB: March 31, 1926 , 89 y.o.   MRN: [id number]  Chief Complaint  Patient presents with   Hypertension    HPI  Patient presents today for a bp and chol check. He reports compliance with meds. Denies headaches, chest pain and shortness of breath.   Hypertension This is a chronic problem. The current episode started more than 1 year ago. The problem has been gradually improving since onset. The problem is controlled. Past treatments include calcium channel blockers, angiotensin blockers, diuretics and lifestyle changes. The current treatment provides moderate improvement. Hypertensive end-organ damage includes kidney disease.     Past Medical History:  Diagnosis Date   Arthritis    Cataract    Glaucoma    Hypertension      Family History  Problem Relation Age of Onset   Dementia Mother    Cancer Father      Current Outpatient Medications:    aspirin EC 81 MG tablet, Take 81 mg by mouth daily., Disp: , Rfl:    furosemide (LASIX) 40 MG tablet, Take 40 mg by mouth daily., Disp: , Rfl:    gabapentin (NEURONTIN) 300 MG capsule, Take 1 capsule (300 mg total) by mouth 2 (two) times daily., Disp: 200 capsule, Rfl: 2   lidocaine (LIDODERM) 5 %, Place 1 patch onto the skin daily. Remove & Discard patch within 12 hours or as directed by MD (Patient not taking: Reported on 09/19/2023), Disp: 30 patch, Rfl: 0   Multiple Vitamin (MULTIVITAMIN WITH MINERALS) TABS tablet, Take 1 tablet by mouth daily. 50+, Disp: , Rfl:    Omega-3 Fatty Acids (FISH OIL) 1200 MG CAPS, Take 1 capsule by mouth daily. , Disp: , Rfl:    Saw Palmetto 450 MG CAPS, Take 1 capsule by mouth daily., Disp: , Rfl:    senna (SENOKOT) 8.6 MG tablet,  Take 1 tablet by mouth daily., Disp: , Rfl:    tamsulosin (FLOMAX) 0.4 MG CAPS capsule, Take 0.4 mg by mouth daily. , Disp: , Rfl:    amLODipine (NORVASC) 5 MG tablet, Take 1 tablet (5 mg total) by mouth daily., Disp: 90 tablet, Rfl: 2   atorvastatin (LIPITOR) 20 MG tablet, TAKE M,W,F ONLY., Disp: 90 tablet, Rfl: 2   Emu Oil OIL, by Does not apply route., Disp: , Rfl:    OVER THE COUNTER MEDICATION, Take 1 tablet by mouth daily. Focus factor, Disp: , Rfl:    OVER THE COUNTER MEDICATION, Apply 1 application  topically daily as needed (pain). Veterinary cream, Disp: , Rfl:    oxyCODONE-acetaminophen (PERCOCET) 7.5-325 MG tablet, Take 1-2 tablets by mouth daily as needed for severe pain (pain score 7-10)., Disp: , Rfl:    timolol (TIMOPTIC) 0.5 % ophthalmic solution, Place 1 drop into both eyes daily., Disp: , Rfl:    trolamine salicylate (BLUE-EMU MAXIMUM PAIN RELIEF) 10 % cream, Apply 1 Application topically daily at 12 noon., Disp: , Rfl:    valsartan (DIOVAN) 80 MG tablet, Take 80 mg by mouth daily., Disp: , Rfl:    Allergies  Allergen Reactions   Cephalexin     Severe mouth and throat dryness.     Review of Systems  Constitutional: Negative.  HENT: Negative.    Respiratory: Negative.    Cardiovascular: Negative.   Skin: Negative.   Allergic/Immunologic: Negative.   Neurological: Negative.   Hematological: Negative.      Today's Vitals   09/12/23 1106  BP: 122/70  Pulse: 70  Temp: 98.5 F (36.9 C)  SpO2: 98%  Weight: 152 lb 6.4 oz (69.1 kg)  Height: 5\' 3"  (1.6 m)   Body mass index is 27 kg/m.  Wt Readings from Last 3 Encounters:  09/12/23 152 lb 6.4 oz (69.1 kg)  07/25/23 150 lb 12.7 oz (68.4 kg)  04/16/23 150 lb 12.8 oz (68.4 kg)     Objective:  Physical Exam Vitals and nursing note reviewed.  Constitutional:      Appearance: Normal appearance.  HENT:     Head: Normocephalic and atraumatic.  Eyes:     Extraocular Movements: Extraocular movements intact.   Cardiovascular:     Rate and Rhythm: Normal rate and regular rhythm.     Heart sounds: Normal heart sounds.  Pulmonary:     Effort: Pulmonary effort is normal.     Breath sounds: Normal breath sounds.  Musculoskeletal:     Comments: Ambulatory with walker Arthritic changes b/l hands  Skin:    General: Skin is warm.  Neurological:     General: No focal deficit present.     Mental Status: He is alert.  Psychiatric:        Mood and Affect: Mood normal.         Assessment And Plan:  Hypertensive heart and renal disease with renal failure, stage 1 through stage 4 or unspecified chronic kidney disease, without heart failure Assessment & Plan: Chronic, well controlled. He reports compliance with meds. He is encouraged to follow low sodium diet. No med changes today. He will rto in March 2025 for his next evaluation.   Orders: -     CMP14+EGFR -     Lipid panel -     CBC  Atherosclerosis of renal artery (HCC) Assessment & Plan: Chronic, LDL goal is less than 70. He will continue with atorvastatin 20mg  MWF dosing.   Orders: -     Lipid panel  Stage 3b chronic kidney disease (HCC) Assessment & Plan: Chronic, he is encouraged to stay well hydrated, avoid NSAIDs and keep BP controlled to prevent progression of CKD.    Orders: -     PTH, intact and calcium -     Protein electrophoresis, serum  Immunization due -     Flu Vaccine Trivalent High Dose (Fluad) -     Pfizer Comirnaty Covid-19 Vaccine 50yrs & older  Other orders -     amLODIPine Besylate; Take 1 tablet (5 mg total) by mouth daily.  Dispense: 90 tablet; Refill: 2 -     Atorvastatin Calcium; TAKE M,W,F ONLY.  Dispense: 90 tablet; Refill: 2     Return if symptoms worsen or fail to improve.  Patient was given opportunity to ask questions. Patient verbalized understanding of the plan and was able to repeat key elements of the plan. All questions were answered to their satisfaction.    I, [name], MD, have  reviewed all documentation for this visit. The documentation on 09/12/23 for the exam, diagnosis, procedures, and orders are all accurate and complete.   IF YOU HAVE BEEN REFERRED TO A SPECIALIST, IT MAY TAKE 1-2 WEEKS TO SCHEDULE/PROCESS THE REFERRAL. IF YOU HAVE NOT HEARD FROM US/SPECIALIST IN TWO WEEKS, PLEASE GIVE [place] A CALL  AT [phone].   THE PATIENT IS ENCOURAGED TO PRACTICE SOCIAL DISTANCING DUE TO THE COVID-19 PANDEMIC.

## 2023-09-12 NOTE — Assessment & Plan Note (Addendum)
Chronic, well controlled. He reports compliance with meds. He is encouraged to follow low sodium diet. No med changes today. He will rto in March 2025 for his next evaluation.

## 2023-09-12 NOTE — Patient Instructions (Signed)
Hypertension, Adult Hypertension is another name for high blood pressure. High blood pressure forces your heart to work harder to pump blood. This can cause problems over time. There are two numbers in a blood pressure reading. There is a top number (systolic) over a bottom number (diastolic). It is best to have a blood pressure that is below 120/80. What are the causes? The cause of this condition is not known. Some other conditions can lead to high blood pressure. What increases the risk? Some lifestyle factors can make you more likely to develop high blood pressure: Smoking. Not getting enough exercise or physical activity. Being overweight. Having too much fat, sugar, calories, or salt (sodium) in your diet. Drinking too much alcohol. Other risk factors include: Having any of these conditions: Heart disease. Diabetes. High cholesterol. Kidney disease. Obstructive sleep apnea. Having a family history of high blood pressure and high cholesterol. Age. The risk increases with age. Stress. What are the signs or symptoms? High blood pressure may not cause symptoms. Very high blood pressure (hypertensive crisis) may cause: Headache. Fast or uneven heartbeats (palpitations). Shortness of breath. Nosebleed. Vomiting or feeling like you may vomit (nauseous). Changes in how you see. Very bad chest pain. Feeling dizzy. Seizures. How is this treated? This condition is treated by making healthy lifestyle changes, such as: Eating healthy foods. Exercising more. Drinking less alcohol. Your doctor may prescribe medicine if lifestyle changes do not help enough and if: Your top number is above 130. Your bottom number is above 80. Your personal target blood pressure may vary. Follow these instructions at home: Eating and drinking  If told, follow the DASH eating plan. To follow this plan: Fill one half of your plate at each meal with fruits and vegetables. Fill one fourth of your plate  at each meal with whole grains. Whole grains include whole-wheat pasta, brown rice, and whole-grain bread. Eat or drink low-fat dairy products, such as skim milk or low-fat yogurt. Fill one fourth of your plate at each meal with low-fat (lean) proteins. Low-fat proteins include fish, chicken without skin, eggs, beans, and tofu. Avoid fatty meat, cured and processed meat, or chicken with skin. Avoid pre-made or processed food. Limit the amount of salt in your diet to less than 1,500 mg each day. Do not drink alcohol if: Your doctor tells you not to drink. You are pregnant, may be pregnant, or are planning to become pregnant. If you drink alcohol: Limit how much you have to: 0-1 drink a day for women. 0-2 drinks a day for men. Know how much alcohol is in your drink. In the [place] one drink equals one 12 oz bottle of beer (355 mL), one 5 oz glass of wine (148 mL), or one 1 oz glass of hard liquor (44 mL). Lifestyle  Work with your doctor to stay at a healthy weight or to lose weight. Ask your doctor what the best weight is for you. Get at least 30 minutes of exercise that causes your heart to beat faster (aerobic exercise) most days of the week. This may include walking, swimming, or biking. Get at least 30 minutes of exercise that strengthens your muscles (resistance exercise) at least 3 days a week. This may include lifting weights or doing Pilates. Do not smoke or use any products that contain nicotine or tobacco. If you need help quitting, ask your doctor. Check your blood pressure at home as told by your doctor. Keep all follow-up visits. Medicines Take over-the-counter and prescription medicines   only as told by your doctor. Follow directions carefully. Do not skip doses of blood pressure medicine. The medicine does not work as well if you skip doses. Skipping doses also puts you at risk for problems. Ask your doctor about side effects or reactions to medicines that you should watch  for. Contact a doctor if: You think you are having a reaction to the medicine you are taking. You have headaches that keep coming back. You feel dizzy. You have swelling in your ankles. You have trouble with your vision. Get help right away if: You get a very bad headache. You start to feel mixed up (confused). You feel weak or numb. You feel faint. You have very bad pain in your: Chest. Belly (abdomen). You vomit more than once. You have trouble breathing. These symptoms may be an emergency. Get help right away. Call 911. Do not wait to see if the symptoms will go away. Do not drive yourself to the hospital. Summary Hypertension is another name for high blood pressure. High blood pressure forces your heart to work harder to pump blood. For most people, a normal blood pressure is less than 120/80. Making healthy choices can help lower blood pressure. If your blood pressure does not get lower with healthy choices, you may need to take medicine. This information is not intended to replace advice given to you by your health care provider. Make sure you discuss any questions you have with your health care provider. Document Revised: 09/07/2021 Document Reviewed: 09/07/2021 Elsevier Patient Education  2024 [employer] Inc.  

## 2023-09-13 ENCOUNTER — Other Ambulatory Visit: Payer: Self-pay

## 2023-09-13 NOTE — Patient Outreach (Signed)
Aging Gracefully Program  09/13/2023  Wayne [name] [birth year] [id number]   [facility] Evaluation Interviewer made contact with patient. Aging Gracefully survey completed.   Interviewer will send referral to RN and OT for follow up.   Vanice Sarah Care Management Assistant [phone]

## 2023-09-16 LAB — CMP14+EGFR
ALT: 14 [IU]/L (ref 0–44)
AST: 22 [IU]/L (ref 0–40)
Albumin: 3.9 g/dL (ref 3.6–4.6)
Alkaline Phosphatase: 103 [IU]/L (ref 44–121)
BUN/Creatinine Ratio: 19 (ref 10–24)
BUN: 32 mg/dL (ref 10–36)
Bilirubin Total: 0.3 mg/dL (ref 0.0–1.2)
CO2: 26 mmol/L (ref 20–29)
Calcium: 9.3 mg/dL (ref 8.6–10.2)
Chloride: 104 mmol/L (ref 96–106)
Creatinine, Ser: 1.67 mg/dL — ABNORMAL HIGH (ref 0.76–1.27)
Globulin, Total: 2.5 g/dL (ref 1.5–4.5)
Glucose: 115 mg/dL — ABNORMAL HIGH (ref 70–99)
Potassium: 4.1 mmol/L (ref 3.5–5.2)
Sodium: 145 mmol/L — ABNORMAL HIGH (ref 134–144)
Total Protein: 6.4 g/dL (ref 6.0–8.5)
eGFR: 38 mL/min/{1.73_m2} — ABNORMAL LOW (ref >59)

## 2023-09-16 LAB — PROTEIN ELECTROPHORESIS, SERUM
A/G Ratio: 1.1 (ref 0.7–1.7)
Albumin ELP: 3.3 g/dL (ref 2.9–4.4)
Alpha 1: 0.3 g/dL (ref 0.0–0.4)
Alpha 2: 0.7 g/dL (ref 0.4–1.0)
Beta: 0.9 g/dL (ref 0.7–1.3)
Gamma Globulin: 1.2 g/dL (ref 0.4–1.8)
Globulin, Total: 3.1 g/dL (ref 2.2–3.9)

## 2023-09-16 LAB — LIPID PANEL
Chol/HDL Ratio: 2.6 {ratio} (ref 0.0–5.0)
Cholesterol, Total: 125 mg/dL (ref 100–199)
HDL: 48 mg/dL (ref >39)
LDL Chol Calc (NIH): 64 mg/dL (ref 0–99)
Triglycerides: 61 mg/dL (ref 0–149)
VLDL Cholesterol Cal: 13 mg/dL (ref 5–40)

## 2023-09-16 LAB — CBC
Hematocrit: 35.1 % — ABNORMAL LOW (ref 37.5–51.0)
Hemoglobin: 11.7 g/dL — ABNORMAL LOW (ref 13.0–17.7)
MCH: 34.2 pg — ABNORMAL HIGH (ref 26.6–33.0)
MCHC: 33.3 g/dL (ref 31.5–35.7)
MCV: 103 fL — ABNORMAL HIGH (ref 79–97)
Platelets: 183 10*3/uL (ref 150–450)
RBC: 3.42 x10E6/uL — ABNORMAL LOW (ref 4.14–5.80)
RDW: 12.5 % (ref 11.6–15.4)
WBC: 3.8 10*3/uL (ref 3.4–10.8)

## 2023-09-16 LAB — PTH, INTACT AND CALCIUM: PTH: 30 pg/mL (ref 15–65)

## 2023-09-20 ENCOUNTER — Other Ambulatory Visit: Payer: Self-pay | Admitting: Rehabilitation

## 2023-09-20 NOTE — Patient Outreach (Signed)
Aging Gracefully Program  OT Initial Visit  09/20/2023  Wayne [name] 15-Aug-1926 [id number]  Visit:  1- Initial Visit  Start Time:  1300 End Time:  1500 Total Minutes:  120  CCAP: Typical Daily Routine: Typical Daily Routine:: Wakes and takes time dressing. Independnet with house cleaning assist from an aide x 2 week. What Types Of Care Problems Are You Having Throughout The Day?: pain from arthritis What Kind Of Help Do You Receive?: In home aide 2 x week Do You Think You Need Other Types Of Help?: no What Do You Think Would Make Everyday Life Easier For You?: less arthritis pain What Is A Good Day Like?: Able to get up and get dressed What Is A Bad Day Like?: With weather changes can have more pain Do You Have Time For Yourself?: yes Patient Reported Equipment: Patient Reported Equipment Currently Used: Grab Bars, Sports administrator, Occupational hygienist, Paediatric nurse, Single [employer] (grab bars on toilet) Functional Mobility-Walking Indoors/Getting Around [employer]: A little difficult Rollator or cane Importance of learning new strategy: a little  Functional Mobility-Walk A Block: Walk A Block: Unable To Do Do You:: N/A- Not Applicable Importance Of Learning New Strategies:: Not At All Functional Mobility-Maintain Balance While Showering: Maintaining Balance While Showering: Moderate Difficulty Do You:: Use A Device (shower seat and grab bars) Importance Of Learning New Strategies:: A Little Observation: Maintain Balance While Showering: N/O Safety: A Little Risk Efficiency: Somewhat Intervention: Yes Functional Mobility-Stooping, Crouching, Kneeling To Retreive Item: Stooping, Crouching, or Kneeling To Retrieve Item: Unable To Do Do You:: N/A- Not Applicable Importance Of Learning New Strategies:: Not At All Functional Mobility-Bending From Standing Position To Pick Up Clothing Off The Floor: Bending Over From Standing Position To Pick Up Clothing Off The Floor: A Lot Of  Difficulty Do You::  (propr on a surface for balance) Importance Of Learning New Strategies:: A Little Functional Mobility-Reaching For Items Above Shoulder Level: Reaching For Items Above Shoulder Level: No Difficulty Do You:: No Device/No Assistance Importance Of Learning New Strategies:: Not At All Functional Mobility-Climb 1 Flight Of Stairs: Climb 1 Flight Of Stairs: A Lot Of Difficulty Do You:: Use A Device Importance Of Learning New Strategies:: Not At All Functional Mobility-Move In And Out Of Chair: Move In and Out Of A Chair: Moderate Difficulty Do You:: No Device/No Assistance Importance Of Learning New Strategies:: Not At All Other Comments:: chooses higher more supportive chairs Functional Mobility-Move In And Out Of Bed: Move In and Out Of Bed: A Lot Of Difficulty Do You:: Use A Device Importance Of Learning New Strategies:: Moderate Other Comments:: high bed, uses a step stool Functional Mobility-Move In And Out Of Bath/Shower: Move In And Out Of A Bath/Shower: A Lot Of Difficulty Do You:: Use A Device Importance Of Learning New Strategies:: Very Much Other Comments:: uses the shower seat and pulls self to stand wtih toile grab bars Observation: Move In And Out Of Bath/Shower: N/O Safety: A Little Risk Efficiency: Not At All Intervention: Yes Functional Mobility-Get On And Off Toilet: Getting Up From The Floor: Unable To Do Do You:: N/A- Not Applicable Importance Of Learning New Strategies:: Not At All Functional Mobility-Into And Out Of Car, Not Including Driving: Into  And Out Of Car, Not Including Driving: A Lot Of Difficulty Do You:: Use A Device Importance Of Learning New Strategies:: Very Much Other Comments:: manually lifts left leg, takes extra time to transition Functional Mobility-Other Mobility Difficulty:      Activities of Daily Living-Bathing/Showering: ADL-Bathing/Showering:  A Lot of Difficulty Do You:: Use A Device Importance Of Learning  New Strategies: Moderate Other Comments:: sits on shower seat but difficulty standing without arm rest on the seat. Reports decreased equilibrium as waashing self ADL Observation: Bathing/Showering: N/O Safety: A Little Risk Efficiency: Somewhat Intervention: Yes Activities of Daily Living-Personal Hygiene and Grooming: Personal Hygiene and Grooming: No Difficulty Do You:: No Device/No Assistance Importance Of Learning New Strategies: Not At All Activities of Daily Living-Toilet Hygiene: Toilet Hygiene: No Difficulty Do You:: No Device/No Assistance Importance Of Learning New Strategies: Not At All Activities of Daily Living-Put On And Take Off Undergarments (Incl. Fasteners): Put On And Take Off Undergarments (Incl. Fasteners): A Little Difficulty Do You:: No Device/No Assistance Importance Of Learning New Strategies: Moderate Other Comments:: sits for dressing. Shoulder bursitis limits ROM/pain Activities of Daily Living-Put On And Take Off Shirt/Dress/Coat (Incl. Fasteners): Put On And Take Off Shirt/Dress/Coat (Incl. Fasteners): A Little Difficulty Do You:: No Device/No Assistance Importance Of Learning New Strategies: Moderate Activities of Daily Living-Put On And Take Off Socks And Shoes: Put On And Take Off Socks And  Shoes: A Little Difficulty Do You:: No Device/No Assistance Importance Of Learning New Strategies: A Little Activities of Daily Living-Feed Self: Feed Self: No Difficulty Do You:: No Device/No Assistance Importance Of Learning New Strategies: Not At All Activities of Daily Living-Rest And Sleep: Rest and Sleep: A Little Difficulty Do You:: No Device/No Assistance Importance Of Learning New Strategies: Not At All Activities of Daily Living-Sexual Activity: Sexual  Activity: N/A Do You:: N/A-Not Applicable Importance Of Learning New Strategies: N/A Activities of Daily Living-Other Activity Identified:    Instrumental Activities of Daily Living-Light  Homemaking (Laundry, Straightening Up, Vacuuming):  Do Light Homemaking (Laundry, Straightening Up, Vacuuming): A Lot of Difficulty Do You:: Use Personal Assistance Importance Of Learning New Strategies: Not At All Instrumental Activities of Daily Living-Making A Bed: Making a Bed: A Lot of Difficulty Do You:: Use Personal Assistance Importance Of Learning New Strategies: Not At All Instrumental Activities of Daily Living-Washing Dishes By Hand While Standing At The Sink: Washing Dishes By Hand While Standing At The Sink: A Little Difficulty Do You:: No Device/No Assistance Importance Of Learning New Strategies: Not At All Instrumental Activities of Daily Living-Grocery Shopping: Do Grocery Shopping: A Lot of Difficulty Do You:: Use A Device Importance Of Learning New Strategies: Not At All Other Comments:: leans on the cart or aide does the grocery shopping Instrumental Activities of Daily Living-Use Telephone: Use Telephone: No Difficulty Do You:: No Device/No Assistance Importance Of Learning New Strategies: Not At All Instrumental Activities of Daily Living-Financial Management: Financial Management: No Difficulty Do You:: No Device/No Assistance Importance Of Learning New Strategies: Not At All Instrumental Activities of Daily Living-Medications: Take Medications: No Difficulty Do You:: No Device/No Assistance Importance Of Learning New Strategies: Not At All Instrumental Activities of Daily Living-Health Management And Maintenance: Health Management & Maintenance: No Difficulty Do You:: No Device/No Assistance Importance Of Learning New Strategies: Not At All Instrumental Activities of Daily Living-Meal Preparation and Clean-Up: Meal Preparation and Clean-Up: A Little Difficulty Do You:: No Device/No Assistance Importance Of Learning New Strategies: Not At All Instrumental Activities of Daily Living-Provide Care For Others/Pets: Care For Others/Pets: N/A Do You:: N/A-Not  Applicable Importance Of Learning New Strategies: N/A Instrumental Activities of Daily Living-Take Part In Organized Social Activities: Take Part In Organized Social Activities: N/A Do You:: N/A-Not Applicable Importance Of Learning New Strategies: N/A Other Comments:: states this is not  important Instrumental Activities of Daily Living-Leisure Participation: Leisure Participation: N/A Do You:: N/A-Not Applicable Importance Of Learning New Strategies: N/A Other Comments:: Also reports not important Instrumental Activities of Daily Living-Employment/Volunteer Activities: Employment/Volunteer Activities: N/A Do You:: N/A-Not Applicable Importance Of Learning New Strategies: N/A Instrumental Activities of Daily Living-Other Identifies:    Readiness To Change Score:  Readiness to Change Score: 8.67  Home Environment Assessment: Outside Home Entry:: Front entrance with uphill walkway with 2 steps and 4 steep steps to landing and a step in Bathroom:: standard tub with grab bars located high, regular shower nozel, shower seat with a back and without arm rests. No grab bars outside of the shower. Toilet has grab bar attached to commode, comfort height elongated commode. Master Bedroom:: 4 post bed high off the floor, uses a step stool Other Home Environment Concerns:: steps in/out of home both front and back, need ramp access at either location.  Durable Medical Equipment:    Patient Education: Education Provided: Yes Education Details: AG consent form, OT goals, Check for Safety handout Person(s) Educated: Patient, Child(ren) (daughter [place]) Comprehension: Verbalized Understanding  Goals:  Goals Addressed             This Visit's Progress    Patient Stated       Improve safety and mobility in the bathroom     Patient Stated       Improve safety and mobility walking in/out of the house        Post Clinical Reasoning: Client Action (Goal) One Interventions: Improve  safety and mobility in the bathroom Client Action (Goal) Two Interventions: Improve safety and mobiliy walking in and out of the house Clinician View Of Client Situation:: Client is an independent 89 yo. He has an alarm he wears around his neck for emergencies. He is willing to share how he functions with ADLs but more quiet and reserved regarding goals and changes. Is agreeable to consideration of shower/bathroom improvements. As reviewing the list of items that are difficult he verbalizes no need to address with strategies. Client View Of His/Her Situation:: Client is almost 89yo, independent with cleaning and grocery help from an aide 2 x week. He is knowledgeable regarding strengths and weakness. Difficulty idnetifying goals with OT. Responsive to needed changes in the bathroom Next Visit Plan:: Falls prevention handout, energy conservation, review modifications for bathroom based of [facility] recommendations.

## 2023-09-22 NOTE — Assessment & Plan Note (Signed)
Chronic, he is now followed by Nephrology.

## 2023-09-22 NOTE — Assessment & Plan Note (Signed)
Chronic, LDL goal is less than 70. He will continue with atorvastatin 20mg  MWF dosing.

## 2023-09-22 NOTE — Assessment & Plan Note (Signed)
 Chronic, he is encouraged to stay well hydrated, avoid NSAIDs and keep BP controlled to prevent progression of CKD.

## 2023-09-23 ENCOUNTER — Other Ambulatory Visit: Payer: Self-pay

## 2023-09-23 ENCOUNTER — Encounter (HOSPITAL_COMMUNITY): Payer: Self-pay | Admitting: Surgery

## 2023-09-23 NOTE — H&P (Addendum)
REFERRING PHYSICIAN: Valerie Roys* PROVIDER: Wayne Both, MD MRN: [id number] DOB: Sep 23, 1926   Subjective   Chief Complaint: New Consultation  History of Present Illness: Wayne [name] is a 89 y.o. male who is seen  as an office consultation for evaluation of a left inguinal hernia  This is a 89 year old gentleman who is referred here for evaluation of a left inguinal hernia. He is accompanied by his son and daughter-in-law. He lives alone and walks with a walker and still drives. He noticed a hernia several months ago. He reports that is getting larger and causing discomfort but no obstructive symptoms. He has had a CT scan showing the left inguinal hernia containing a loop of small bowel. He has recently seen cardiology for bradycardia. He denies chest pain or shortness of breath.  Review of Systems: A complete review of systems was obtained from the patient. I have reviewed this information and discussed as appropriate with the patient. See HPI as well for other ROS.  ROS   Medical History: Past Medical History:  Diagnosis Date  Arthritis  Asthma, unspecified asthma severity, unspecified whether complicated, unspecified whether persistent ([facility])  Chronic kidney disease  GERD (gastroesophageal reflux disease)  Glaucoma (increased eye pressure)  Hypertension   There is no problem list on file for this patient.  Past Surgical History:  Procedure Laterality Date  Back surgery  Hand surgery  Knee surgery    No Known Allergies  Current Outpatient Medications on File Prior to Visit  Medication Sig Dispense Refill  amLODIPine (NORVASC) 5 MG tablet  aspirin 81 MG EC tablet Take 81 mg by mouth once daily  atorvastatin (LIPITOR) 20 MG tablet TAKE M,W,F ONLY.  diclofenac (VOLTAREN) 1 % topical gel Apply 2 g topically 4 (four) times daily  fluocinonide (LIDEX) 0.05 % cream  FUROsemide (LASIX) 40 MG tablet Take 40 mg by mouth once daily  gabapentin  (NEURONTIN) 300 MG capsule Take 300 mg by mouth 2 (two) times daily  ketoconazole (NIZORAL) 2 % cream Apply topically  multivitamin with B complex-vitamin C (FARBEE WITH C) tablet Take 1 tablet by mouth once daily  multivitamin with iron/minerals (THERADEX-M) 27-0.4 mg tablet Take 1 tablet by mouth once daily  omeprazole (PRILOSEC) 40 MG DR capsule  oxyCODONE-acetaminophen (PERCOCET) 5-325 mg tablet take 1/2 to 1 tablet by mouth once daily if needed for pain   No current facility-administered medications on file prior to visit.   Family History  Problem Relation Age of Onset  High blood pressure (Hypertension) Mother  High blood pressure (Hypertension) Father  High blood pressure (Hypertension) Sister    Social History   Tobacco Use  Smoking Status Every Day  Types: Cigarettes  Smokeless Tobacco Never    Social History   Socioeconomic History  Marital status: Widowed  Tobacco Use  Smoking status: Every Day  Types: Cigarettes  Smokeless tobacco: Never  Substance and Sexual Activity  Alcohol use: Never  Drug use: Never   Social Determinants of Health   Financial Resource Strain: Low Risk (01/30/2023)  Received from [facility]  Overall Financial Resource Strain (CARDIA)  Difficulty of Paying Living Expenses: Not hard at all  Food Insecurity: No Food Insecurity (04/26/2023)  [facility] from [facility]  Hunger Vital Sign  Worried About Running Out of Food in the Last Year: Never true  Ran Out of Food in the Last Year: Never true  Transportation Needs: No Transportation Needs (04/26/2023)  Received from [facility] - Transportation  Lack of Transportation (Medical): No  Lack of Transportation (Non-Medical): No  Physical Activity: Inactive (01/30/2023)  Received from [facility]  Exercise Vital Sign  Days of Exercise per Week: 0 days  Minutes of Exercise per Session: 0 min  Stress: No Stress Concern Present (01/30/2023)  Received from [facility] - Occupational Stress Questionnaire  Feeling of Stress : Not at all   Objective:   Vitals:  06/19/23 1501 06/19/23 1502  BP: 131/65  Pulse: 62  Temp: 36.9 C (98.5 F)  SpO2: 96%  Weight: 66.2 kg (146 lb)  Height: 161.3 cm (5' 3.5")  PainSc: 0-No pain  PainLoc: Abdomen   Body mass index is 25.46 kg/m.  Physical Exam   He is awake and alert on exam  He answers questions appropriately and follows commands  He has an easily reducible left inguinal hernia which feels like a direct hernia  There is no evidence of umbilical hernia right inguinal hernia  Labs, Imaging and Diagnostic Testing: I reviewed his CT scan and the notes in the electronic medical records  Assessment and Plan:   Diagnoses and all orders for this visit:  Left inguinal hernia   I explained abdominal wall anatomy to the patient and his family. We discussed hernias and the need for hernia repair. We discussed continued conservative management as well as the risk of strangulation of bowel or larger in the hernia without surgery. From a surgical standpoint, given his age we discussed the risk of anesthesia and cardiac events. If they decide to proceed with surgery I would recommend an open left inguinal hernia repair with a tap block. We could even consider doing this with a MAC anesthesia. We discussed the risk which includes but is not limited to bleeding, infection, injury to surrounding structures, the use of mesh, hernia recurrence, cardiopulmonary issues especially given his age, postoperative recovery, etc. They would like to proceed with working him up for surgery which would be cardiac clearance .  He may need to at least need to stay overnight and may have to consider going to rehab postoperatively as well.

## 2023-09-23 NOTE — Progress Notes (Addendum)
SDW CALL  With patient's verbal permission, his daughter, [place] Jackson was given pre-op instructions over the phone. The opportunity was given for the patient and his daughter to ask questions. No further questions asked. Patient's daughter verbalized understanding of instructions given.   PCP - Farrel Conners Cardiologist - Juliane Lack- cardiac clearance - 07/22/23 Nephrologist - Willette Pa Singh,MD Urologist- Alinda Deem  PPM/ICD - denies Device Orders -  Rep Notified -   Chest x-ray - na EKG - 04/16/23 Stress Test - 2013 ECHO - 02/15/12 Cardiac Cath - 03/18/12  Sleep Study - denies CPAP -   Fasting Blood Sugar - na Checks Blood Sugar _____ times a day  Blood Thinner Instructions:na Aspirin Instructions:LD 09/23/23  ERAS Protcol - PRE-SURGERY Ensure or G2-   COVID TEST-    Anesthesia review: yes- hx- HTN,angioplasty 2013  Patient deniedshortness of breath, fever, cough and chest pain over the phone call    Surgical Instructions    Your procedure is scheduled on October 22  Report to [facility] Main Entrance "A" at 0700 A.M., then check in with the Admitting office.  Call this number if you have problems the morning of surgery:  [phone]    Remember:  Do not eat after midnight the night before your surgery  You may drink clear liquids until 0630am the morning of your surgery.   Clear liquids allowed are: Water, Non-Citrus Juices (without pulp), Carbonated Beverages, Clear Tea, Black Coffee ONLY (NO MILK, CREAM OR POWDERED CREAMER of any kind), and Gatorade   Take these medicines the morning of surgery with A SIP OF WATER: Norvasc,Neurontin,Flomax,Timoptic. PRN Percocet  As of today, STOP taking any Aspirin (unless otherwise instructed by your surgeon) Aleve, Naproxen, Ibuprofen, Motrin, Advil, Goody's, BC's, all herbal medications, fish oil, and all vitamins.  [facility] is not responsible for any belongings or valuables. .   Do NOT  Smoke (Tobacco/Vaping)  24 hours prior to your procedure  If you use a CPAP at night, you may bring your mask for your overnight stay.   Contacts, glasses, hearing aids, dentures or partials may not be worn into surgery, please bring cases for these belongings   Patients discharged the day of surgery will not be allowed to drive home, and someone needs to stay with them for 24 hours.   Special instructions:    Oral Hygiene is also important to reduce your risk of infection.  Remember - BRUSH YOUR TEETH THE MORNING OF SURGERY WITH YOUR REGULAR TOOTHPASTE   Day of Surgery:  Take a shower the day of or night before with antibacterial soap. Wear Clean/Comfortable clothing the morning of surgery Do not apply any deodorants/lotions.   Do not wear jewelry or makeup Do not wear lotions, powders, perfumes/colognes, or deodorant. Do not shave 48 hours prior to surgery.  Men may shave face and neck. Do not bring valuables to the hospital. Do not wear nail polish, gel polish, artificial nails, or any other type of covering on natural nails (fingers and toes) If you have artificial nails or gel coating that need to be removed by a nail salon, please have this removed prior to surgery. Artificial nails or gel coating may interfere with anesthesia's ability to adequately monitor your vital signs. Remember to brush your teeth WITH YOUR REGULAR TOOTHPASTE.

## 2023-09-23 NOTE — Progress Notes (Signed)
Anesthesia Chart Review: Wayne [name]  Case: [id number] Date/Time: 09/24/23 0915   Procedure: LEFT HERNIA REPAIR INGUINAL ADULT WITH MESH (Left) - CHOICE/TAP BLOCK   Anesthesia type: Choice   Pre-op diagnosis: LEFT INGUINAL HERNIA   Location: [facility] ROOM 10 / [facility]   Surgeons: Abigail Miyamoto, MD       DISCUSSION: Patient is a 89 year old male scheduled for the above procedure.  History includes former smoker, HTN, bradycardia, renal artery stenosis (s/p right renal artery PTA/stent 03/18/12), CKD (3b), BPH, right renal cystic lesion (followed by urologist Dr. Liliane Shi), glaucoma, arthritis, spinal surgery.  Last office visit with cardiologist Dr. Eldridge Dace was on 04/16/23 for asymptomatic bradycardia (HR 40's-60's) evaluation. He had no findings of CHF. No active chest pain or presyncopal symptoms. He was able to walk in the office multiple times without any difficulty. No indication for PPM at that time. As needed cardiology follow-up recommended.   He had preoperative cardiology telephonic evaluation on 07/22/23 with Edd Fabian, NP. He was going well from a cardiac standpoint. He wrote, "Given past medical history and time since last visit, based on ACC/AHA guidelines, Wayne [name] would be at acceptable risk for the planned procedure without further cardiovascular testing.    His RCRI is a class I risk, 0.4% risk of major cardiac event.  He is able to complete greater than 4 METS of physical activity... Patient's aspirin is not prescribed by cardiology. Recommendations for holding aspirin will need to come from prescribing provider."   Creatinine 1.67 with eGFR 38. This is consistent with his known CKD stage 3b history that is followed by nephrologist Dr. Thedore Mins.  Anesthesia team to evaluate on the day of surgery.   VS:  Wt Readings from Last 3 Encounters:  09/12/23 69.1 kg  07/25/23 68.4 kg  04/16/23 68.4 kg   BP Readings from Last 3 Encounters:  09/12/23 122/70  07/25/23  139/72  06/25/23 120/70   Pulse Readings from Last 3 Encounters:  09/12/23 70  07/25/23 (!) 52  04/16/23 (!) 54     PROVIDERS: Dorothyann Peng, MD is PCP  - Anthony Sar, MD is nephrologist. 07/18/23 office note is scanned under Media tab.  CKD likely related to longstanding hypertension with evidence of renal atrophy bilaterally on imaging.  BUN 31, creatinine 1.64, EGFR 39 on labs that day. Rhoderick Moody, MD is urologist.  04/22/2023 office note is scanned under media tab.  He was seen for follow-up BPH and right kidney renal cyst.  Continue tamsulosin twice daily and renal ultrasound in 12 months for surveillance. Lance Muss, MD is cardiologist. As needed follow-up for asymptomatic bradycardia recommended at 04/16/23 office visit.   LABS: For day of surgery as indicated. Most recent results in [facility] include: Lab Results  Component Value Date   WBC 3.8 09/12/2023   HGB 11.7 (L) 09/12/2023   HCT 35.1 (L) 09/12/2023   PLT 183 09/12/2023   GLUCOSE 115 (H) 09/12/2023   CHOL 125 09/12/2023   TRIG 61 09/12/2023   HDL 48 09/12/2023   LDLCALC 64 09/12/2023   ALT 14 09/12/2023   AST 22 09/12/2023   NA 145 (H) 09/12/2023   K 4.1 09/12/2023   CL 104 09/12/2023   CREATININE 1.67 (H) 09/12/2023   BUN 32 09/12/2023   CO2 26 09/12/2023   TSH 1.580 06/25/2023     IMAGES: MR Abd 03/26/23(for f/u suspected Bosniak category III right kidney lesion): IMPRESSION: 1. Suspect slight enlargement of a complex cyst  of the posterior midportion of the right kidney, with a contrast enhancing peripheral nodule, cyst measuring 1.1 x 1.0 cm, previously 0.9 x 0.9 cm and 0.5 cm respectively. Findings remain concerning for a small renal cell carcinoma (Bosniak category III). 2. Additional benign bilateral renal cortical cysts, for which no specific further follow-up or characterization is required. 3. No evidence of abdominal metastatic disease or lymphadenopathy. 4. Gallbladder  sludge.    EKG: 04/16/2023: Sinus bradycardia at 54 bpm.  Sinus arrhythmia.  Left axis deviation.  Right bundle branch block.    CV: Cardiac cath with abd aortogram, selective right renal arteriogram 03/18/12: Hemodynamic data: - Left ventricular pressure was 117/3 with LVEDP of 9 mm mercury. Aortic pressure was 104/63 with a mean of 88 mm mercury. - Left ventricle: Not performed to conserve contrast. Has known normal LV systolic function. - Right coronary artery: The vessel is smooth with mild luminal irregularity. It is dominant. No significant stenosis. - Left main coronary artery is large and normal.  - Circumflex coronary artery: A large vessel giving origin to a large obtuse marginal 1. Ostium has a 40-50% stenosis.   - LAD: Ostroff LAD has a 40-50% stenoses. LAD gives origin to a large diagonal 1. LAD has mild luminal irregularity. No high-grade stenosis was evident.   - Abdominal aortogram: There were 2 renal arteries one on either sides. The right renal artery showed high-grade stenoses. This was confirmed by selective right lower arteriogram which confirmed high-grade 80-90% stenoses with severe damping of flow with engagement with the catheter. Left renal was widely patent. S/p PTA and stenting of the right lower artery with implantation of a 6.0 x 15 mm Herculink Elite stent.    - Abdominal aorta was normal without evidence of abdominal aortic aneurysm. Aortoiliac bifurcation is widely patent.    Per 03/18/12 Cardiac cath report by Yates Decamp, MD, "Indication patient is a 89 year old fairly active gentleman with history of difficult to control hypertension who presents with shortness of breath. His echocardiogram had revealed normal systolic function, moderate left hypertrophy and mild pulmonary hypertension. A stress test revealed distal anterior and anteroapical severe small sized defect suggestive of coronary artery disease. The study performed on 02/15/2012. Hence is brought to  the cardiac catheterization lab to evaluate his coronary anatomy. There was also suspicion for renal artery stenosis given difficult to control hypertension." See above for LHC/aortogram report.    Past Medical History:  Diagnosis Date   Arthritis    Bradycardia    Cataract    CKD (chronic kidney disease)    Glaucoma    History of renal artery stenosis 03/18/2012   right renal PTA/stenting   Hypertension    Renal cyst, right     Past Surgical History:  Procedure Laterality Date   APPENDECTOMY     BACK SURGERY     FOOT SURGERY     JOINT REPLACEMENT     kidney stent Left 2015   KNEE SURGERY Left x 2    left ankle     LEFT HEART CATHETERIZATION WITH CORONARY ANGIOGRAM N/A 03/18/2012   Procedure: LEFT HEART CATHETERIZATION WITH CORONARY ANGIOGRAM;  Surgeon: Pamella Pert, MD;  Location: [facility] CATH LAB;  Service: Cardiovascular;  Laterality: N/A;   RENAL ANGIOGRAM N/A 03/18/2012   Procedure: RENAL ANGIOGRAM;  Surgeon: Pamella Pert, MD;  Location: [facility] CATH LAB;  Service: Cardiovascular;  Laterality: N/A;   right wrist     SPINE SURGERY      MEDICATIONS: No  current facility-administered medications for this encounter.    amLODipine (NORVASC) 5 MG tablet   aspirin EC 81 MG tablet   atorvastatin (LIPITOR) 20 MG tablet   Emu Oil OIL   furosemide (LASIX) 40 MG tablet   gabapentin (NEURONTIN) 300 MG capsule   Multiple Vitamin (MULTIVITAMIN WITH MINERALS) TABS tablet   Omega-3 Fatty Acids (FISH OIL) 1200 MG CAPS   OVER THE COUNTER MEDICATION   OVER THE COUNTER MEDICATION   oxyCODONE-acetaminophen (PERCOCET) 7.5-325 MG tablet   Saw Palmetto 450 MG CAPS   senna (SENOKOT) 8.6 MG tablet   tamsulosin (FLOMAX) 0.4 MG CAPS capsule   timolol (TIMOPTIC) 0.5 % ophthalmic solution   trolamine salicylate (BLUE-EMU MAXIMUM PAIN RELIEF) 10 % cream   valsartan (DIOVAN) 80 MG tablet   lidocaine (LIDODERM) 5 %    Shonna Chock, PA-C Surgical Short Stay/Anesthesiology [facility] Phone  [phone] [facility] Phone [phone] 09/23/2023 1:33 PM

## 2023-09-23 NOTE — Anesthesia Preprocedure Evaluation (Signed)
Anesthesia Evaluation  Patient identified by MRN, date of birth, ID band Patient awake    Reviewed: Allergy & Precautions, H&P , NPO status , Patient's Chart, lab work & pertinent test results  Airway Mallampati: II  TM Distance: >3 FB Neck ROM: Full    Dental no notable dental hx.    Pulmonary neg pulmonary ROS, former smoker   Pulmonary exam normal breath sounds clear to auscultation       Cardiovascular hypertension, Pt. on medications + Peripheral Vascular Disease  Normal cardiovascular exam Rhythm:Regular Rate:Normal  Last office visit with cardiologist Dr. Eldridge Dace was on 04/16/23 for asymptomatic bradycardia (HR 40's-60's) evaluation- good functional status, asymptomatic so no indication for PPM at the time   Neuro/Psych  Headaches  negative psych ROS   GI/Hepatic negative GI ROS, Neg liver ROS,,,  Endo/Other  negative endocrine ROS    Renal/GU CRFRenal diseaserenal artery stenosis (s/p right renal artery PTA/stent 03/18/12) CKD 3, Cr 1.67  negative genitourinary   Musculoskeletal  (+) Arthritis , Osteoarthritis,    Abdominal   Peds negative pediatric ROS (+)  Hematology negative hematology ROS (+)   Anesthesia Other Findings   Reproductive/Obstetrics negative OB ROS                              Anesthesia Physical Anesthesia Plan  ASA: 3  Anesthesia Plan: General and Regional   Post-op Pain Management: Tylenol PO (pre-op)* and Regional block*   Induction: Intravenous  PONV Risk Score and Plan: 2 and Ondansetron, Dexamethasone and Treatment may vary due to age or medical condition  Airway Management Planned: Oral ETT  Additional Equipment: None  Intra-op Plan:   Post-operative Plan: Extubation in OR  Informed Consent: I have reviewed the patients History and Physical, chart, labs and discussed the procedure including the risks, benefits and alternatives for the proposed  anesthesia with the patient or authorized representative who has indicated his/her understanding and acceptance.     Dental advisory given  Plan Discussed with: CRNA  Anesthesia Plan Comments: (  )        Anesthesia Quick Evaluation

## 2023-09-24 ENCOUNTER — Other Ambulatory Visit: Payer: Self-pay

## 2023-09-24 ENCOUNTER — Ambulatory Visit (HOSPITAL_COMMUNITY): Payer: Medicare Other | Admitting: Vascular Surgery

## 2023-09-24 ENCOUNTER — Ambulatory Visit (HOSPITAL_COMMUNITY)
Admission: RE | Admit: 2023-09-24 | Discharge: 2023-09-24 | Disposition: A | Discharge status: Home / Self Care | Payer: Medicare Other | Attending: Surgery | Admitting: Surgery

## 2023-09-24 ENCOUNTER — Encounter (HOSPITAL_COMMUNITY)
Admission: RE | Disposition: A | Discharge status: Home / Self Care | Payer: Self-pay | Source: Home / Self Care | Attending: Surgery

## 2023-09-24 ENCOUNTER — Ambulatory Visit (HOSPITAL_BASED_OUTPATIENT_CLINIC_OR_DEPARTMENT_OTHER): Payer: Medicare Other | Admitting: Vascular Surgery

## 2023-09-24 DIAGNOSIS — I739 Peripheral vascular disease, unspecified: Secondary | ICD-10-CM | POA: Insufficient documentation

## 2023-09-24 DIAGNOSIS — I129 Hypertensive chronic kidney disease with stage 1 through stage 4 chronic kidney disease, or unspecified chronic kidney disease: Secondary | ICD-10-CM | POA: Diagnosis not present

## 2023-09-24 DIAGNOSIS — K4090 Unilateral inguinal hernia, without obstruction or gangrene, not specified as recurrent: Secondary | ICD-10-CM | POA: Diagnosis not present

## 2023-09-24 DIAGNOSIS — Z7982 Long term (current) use of aspirin: Secondary | ICD-10-CM | POA: Diagnosis not present

## 2023-09-24 DIAGNOSIS — F17210 Nicotine dependence, cigarettes, uncomplicated: Secondary | ICD-10-CM | POA: Insufficient documentation

## 2023-09-24 DIAGNOSIS — R001 Bradycardia, unspecified: Secondary | ICD-10-CM | POA: Insufficient documentation

## 2023-09-24 DIAGNOSIS — N189 Chronic kidney disease, unspecified: Secondary | ICD-10-CM | POA: Diagnosis not present

## 2023-09-24 DIAGNOSIS — N1832 Chronic kidney disease, stage 3b: Secondary | ICD-10-CM | POA: Diagnosis not present

## 2023-09-24 DIAGNOSIS — G8918 Other acute postprocedural pain: Secondary | ICD-10-CM | POA: Diagnosis not present

## 2023-09-24 HISTORY — DX: Cyst of kidney, acquired: N28.1

## 2023-09-24 HISTORY — DX: Chronic kidney disease, unspecified: N18.9

## 2023-09-24 HISTORY — PX: INGUINAL HERNIA REPAIR: SHX194

## 2023-09-24 HISTORY — DX: Bradycardia, unspecified: R00.1

## 2023-09-24 SURGERY — REPAIR, HERNIA, INGUINAL, ADULT
Anesthesia: Regional | Laterality: Left

## 2023-09-24 MED ORDER — ONDANSETRON HCL 4 MG/2ML IJ SOLN: 4.0000 mg | Freq: Once | INTRAMUSCULAR | Status: DC | PRN

## 2023-09-24 MED ORDER — CIPROFLOXACIN IN D5W 400 MG/200ML IV SOLN
INTRAVENOUS | Status: AC
  Filled 2023-09-24: qty 200

## 2023-09-24 MED ORDER — ACETAMINOPHEN 500 MG PO TABS
ORAL_TABLET | ORAL | Status: AC
  Administered 2023-09-24: 1000 mg via ORAL
  Filled 2023-09-24: qty 2

## 2023-09-24 MED ORDER — DEXAMETHASONE SODIUM PHOSPHATE 10 MG/ML IJ SOLN
INTRAMUSCULAR | Status: DC | PRN
  Administered 2023-09-24: 5 mg via INTRAVENOUS

## 2023-09-24 MED ORDER — CIPROFLOXACIN IN D5W 400 MG/200ML IV SOLN
400.0000 mg | INTRAVENOUS | Status: AC
  Administered 2023-09-24: 400 mg via INTRAVENOUS

## 2023-09-24 MED ORDER — ORAL CARE MOUTH RINSE
15.0000 mL | Freq: Once | OROMUCOSAL | Status: AC
Start: 2023-09-24 — End: 2023-09-24

## 2023-09-24 MED ORDER — ORAL CARE MOUTH RINSE: 15.0000 mL | Freq: Once | OROMUCOSAL | Status: DC

## 2023-09-24 MED ORDER — CHLORHEXIDINE GLUCONATE CLOTH 2 % EX PADS: 6.0000 | MEDICATED_PAD | Freq: Once | CUTANEOUS | Status: DC

## 2023-09-24 MED ORDER — FENTANYL CITRATE (PF) 100 MCG/2ML IJ SOLN: 25.0000 ug | INTRAMUSCULAR | Status: DC | PRN

## 2023-09-24 MED ORDER — FENTANYL CITRATE (PF) 250 MCG/5ML IJ SOLN
INTRAMUSCULAR | Status: AC
  Filled 2023-09-24: qty 5

## 2023-09-24 MED ORDER — 0.9 % SODIUM CHLORIDE (POUR BTL) OPTIME
TOPICAL | Status: DC | PRN
  Administered 2023-09-24: 1000 mL

## 2023-09-24 MED ORDER — VASOPRESSIN 20 UNIT/ML IV SOLN
INTRAVENOUS | Status: AC
  Filled 2023-09-24: qty 1

## 2023-09-24 MED ORDER — PROPOFOL 10 MG/ML IV BOLUS
INTRAVENOUS | Status: DC | PRN
  Administered 2023-09-24: 200 mg via INTRAVENOUS

## 2023-09-24 MED ORDER — CHLORHEXIDINE GLUCONATE 0.12 % MT SOLN
OROMUCOSAL | Status: AC
  Administered 2023-09-24: 15 mL via OROMUCOSAL
  Filled 2023-09-24: qty 15

## 2023-09-24 MED ORDER — CHLORHEXIDINE GLUCONATE 0.12 % MT SOLN: 15.0000 mL | Freq: Once | OROMUCOSAL | Status: AC

## 2023-09-24 MED ORDER — BUPIVACAINE LIPOSOME 1.3 % IJ SUSP
INTRAMUSCULAR | Status: AC
  Filled 2023-09-24: qty 10

## 2023-09-24 MED ORDER — BUPIVACAINE-EPINEPHRINE (PF) 0.5% -1:200000 IJ SOLN
INTRAMUSCULAR | Status: AC
  Filled 2023-09-24: qty 30

## 2023-09-24 MED ORDER — GLYCOPYRROLATE 0.2 MG/ML IJ SOLN
INTRAMUSCULAR | Status: DC | PRN
  Administered 2023-09-24: .1 mg via INTRAVENOUS

## 2023-09-24 MED ORDER — LACTATED RINGERS IV SOLN: INTRAVENOUS | Status: DC | PRN

## 2023-09-24 MED ORDER — VASOPRESSIN 20 UNIT/ML IV SOLN
INTRAVENOUS | Status: DC | PRN
  Administered 2023-09-24: .5 [IU] via INTRAVENOUS

## 2023-09-24 MED ORDER — BUPIVACAINE-EPINEPHRINE 0.5% -1:200000 IJ SOLN
INTRAMUSCULAR | Status: DC | PRN
  Administered 2023-09-24: 10 mL

## 2023-09-24 MED ORDER — CHLORHEXIDINE GLUCONATE 0.12 % MT SOLN: 15.0000 mL | Freq: Once | OROMUCOSAL | Status: DC

## 2023-09-24 MED ORDER — ACETAMINOPHEN 500 MG PO TABS: 1000.0000 mg | ORAL_TABLET | Freq: Once | ORAL | Status: AC

## 2023-09-24 MED ORDER — ONDANSETRON HCL 4 MG/2ML IJ SOLN
INTRAMUSCULAR | Status: DC | PRN
  Administered 2023-09-24: 4 mg via INTRAVENOUS

## 2023-09-24 MED ORDER — TRAMADOL HCL 50 MG PO TABS: 50.0000 mg | ORAL_TABLET | Freq: Four times a day (QID) | ORAL | 0 refills | Status: DC | PRN

## 2023-09-24 MED ORDER — FENTANYL CITRATE (PF) 250 MCG/5ML IJ SOLN
INTRAMUSCULAR | Status: DC | PRN
  Administered 2023-09-24: 50 ug via INTRAVENOUS

## 2023-09-24 MED ORDER — EPHEDRINE SULFATE-NACL 50-0.9 MG/10ML-% IV SOSY
PREFILLED_SYRINGE | INTRAVENOUS | Status: DC | PRN
  Administered 2023-09-24 (×2): 5 mg via INTRAVENOUS
  Administered 2023-09-24: 10 mg via INTRAVENOUS

## 2023-09-24 MED ORDER — FENTANYL CITRATE (PF) 100 MCG/2ML IJ SOLN: 100.0000 ug | Freq: Once | INTRAMUSCULAR | Status: AC

## 2023-09-24 MED ORDER — FENTANYL CITRATE (PF) 100 MCG/2ML IJ SOLN
INTRAMUSCULAR | Status: AC
  Administered 2023-09-24: 100 ug via INTRAVENOUS
  Filled 2023-09-24: qty 2

## 2023-09-24 MED ORDER — PROPOFOL 10 MG/ML IV BOLUS
INTRAVENOUS | Status: AC
  Filled 2023-09-24: qty 20

## 2023-09-24 SURGICAL SUPPLY — 38 items
ADH SKN CLS APL DERMABOND .7 (GAUZE/BANDAGES/DRESSINGS) ×1
APL PRP STRL LF DISP 70% ISPRP (MISCELLANEOUS) ×1
BAG COUNTER SPONGE SURGICOUNT (BAG) IMPLANT
BAG SPNG CNTER NS LX DISP (BAG)
BLADE CLIPPER SURG (BLADE) IMPLANT
CHLORAPREP W/TINT 26 (MISCELLANEOUS) ×1 IMPLANT
COVER SURGICAL LIGHT HANDLE (MISCELLANEOUS) ×1 IMPLANT
DERMABOND ADVANCED .7 DNX12 (GAUZE/BANDAGES/DRESSINGS) ×1 IMPLANT
DRAIN PENROSE .5X12 LATEX STL (DRAIN) IMPLANT
DRAPE LAPAROTOMY TRNSV 102X78 (DRAPES) ×1 IMPLANT
ELECT REM PT RETURN 9FT ADLT (ELECTROSURGICAL) ×1
ELECTRODE REM PT RTRN 9FT ADLT (ELECTROSURGICAL) ×1 IMPLANT
GLOVE SURG SIGNA 7.5 PF LTX (GLOVE) ×1 IMPLANT
GOWN STRL REUS W/ TWL LRG LVL3 (GOWN DISPOSABLE) ×1 IMPLANT
GOWN STRL REUS W/ TWL XL LVL3 (GOWN DISPOSABLE) ×1 IMPLANT
GOWN STRL REUS W/TWL LRG LVL3 (GOWN DISPOSABLE) ×1
GOWN STRL REUS W/TWL XL LVL3 (GOWN DISPOSABLE) ×1
KIT BASIN OR (CUSTOM PROCEDURE TRAY) ×1 IMPLANT
KIT TURNOVER KIT B (KITS) ×1 IMPLANT
MESH PARIETEX PROGRIP LEFT (Mesh General) IMPLANT
NDL HYPO 25GX1X1/2 BEV (NEEDLE) ×1 IMPLANT
NEEDLE HYPO 25GX1X1/2 BEV (NEEDLE) ×1
NS IRRIG 1000ML POUR BTL (IV SOLUTION) ×1 IMPLANT
PACK GENERAL/GYN (CUSTOM PROCEDURE TRAY) ×1 IMPLANT
PAD ARMBOARD 7.5X6 YLW CONV (MISCELLANEOUS) ×1 IMPLANT
PENCIL SMOKE EVACUATOR (MISCELLANEOUS) ×1 IMPLANT
SUT MNCRL AB 4-0 PS2 18 (SUTURE) IMPLANT
SUT MON AB 4-0 PC3 18 (SUTURE) ×1 IMPLANT
SUT SILK 2 0 SH (SUTURE) IMPLANT
SUT VIC AB 2-0 CT1 27 (SUTURE) ×1
SUT VIC AB 2-0 CT1 TAPERPNT 27 (SUTURE) ×1 IMPLANT
SUT VIC AB 3-0 CT1 27 (SUTURE)
SUT VIC AB 3-0 CT1 TAPERPNT 27 (SUTURE) ×1 IMPLANT
SUT VIC AB 3-0 SH 27 (SUTURE) ×1
SUT VIC AB 3-0 SH 27X BRD (SUTURE) IMPLANT
SYR CONTROL 10ML LL (SYRINGE) ×1 IMPLANT
TOWEL GREEN STERILE (TOWEL DISPOSABLE) ×1 IMPLANT
TOWEL GREEN STERILE FF (TOWEL DISPOSABLE) ×1 IMPLANT

## 2023-09-24 NOTE — Progress Notes (Signed)
Dr. Salvadore Farber aware of patient's HR being low.

## 2023-09-24 NOTE — Anesthesia Postprocedure Evaluation (Signed)
Anesthesia Post Note  Patient: Wayne [name]  Procedure(s) Performed: LEFT HERNIA REPAIR INGUINAL ADULT WITH MESH (Left)     Patient location during evaluation: PACU Anesthesia Type: Regional and General Level of consciousness: awake and alert, oriented and patient cooperative Pain management: pain level controlled Vital Signs Assessment: post-procedure vital signs reviewed and stable Respiratory status: spontaneous breathing, nonlabored ventilation and respiratory function stable Cardiovascular status: blood pressure returned to baseline and stable Postop Assessment: no apparent nausea or vomiting Anesthetic complications: no   No notable events documented.  Last Vitals:  Vitals:   09/24/23 1100 09/24/23 1115  BP: (!) 107/58 120/63  Pulse: (!) 50 (!) 51  Resp: 10 12  Temp:  36.5 C  SpO2: 96% 98%    Last Pain:  Vitals:   09/24/23 1115  PainSc: 0-No pain                 Lannie Fields

## 2023-09-24 NOTE — Interval H&P Note (Signed)
History and Physical Interval Note: no change in H and P  09/24/2023 9:14 AM  Wayne [name]  has presented today for surgery, with the diagnosis of LEFT INGUINAL HERNIA.  The various methods of treatment have been discussed with the patient and family. After consideration of risks, benefits and other options for treatment, the patient has consented to  Procedure(s) with comments: LEFT HERNIA REPAIR INGUINAL ADULT WITH MESH (Left) - CHOICE/TAP BLOCK as a surgical intervention.  The patient's history has been reviewed, patient examined, no change in status, stable for surgery.  I have reviewed the patient's chart and labs.  Questions were answered to the patient's satisfaction.     Abigail Miyamoto

## 2023-09-24 NOTE — Discharge Instructions (Signed)
[facility] _______[facility]  UMBILICAL OR INGUINAL HERNIA REPAIR: POST OP INSTRUCTIONS  Always review your discharge instruction sheet given to you by the facility where your surgery was performed. IF YOU HAVE DISABILITY OR FAMILY LEAVE FORMS, YOU MUST BRING THEM TO THE OFFICE FOR PROCESSING.   DO NOT GIVE THEM TO YOUR DOCTOR.  1. A  prescription for pain medication may be given to you upon discharge.  Take your pain medication as prescribed, if needed.  If narcotic pain medicine is not needed, then you may take acetaminophen (Tylenol) or ibuprofen (Advil) as needed. 2. Take your usually prescribed medications unless otherwise directed. If you need a refill on your pain medication, please contact your pharmacy.  They will contact our office to request authorization. Prescriptions will not be filled after 5 pm or on week-ends. 3. You should follow a light diet the first 24 hours after arrival home, such as soup and crackers, etc.  Be sure to include lots of fluids daily.  Resume your normal diet the day after surgery. 4.Most patients will experience some swelling and bruising around the umbilicus or in the groin and scrotum.  Ice packs and reclining will help.  Swelling and bruising can take several days to resolve.  6. It is common to experience some constipation if taking pain medication after surgery.  Increasing fluid intake and taking a stool softener (such as Colace) will usually help or prevent this problem from occurring.  A mild laxative (Milk of Magnesia or Miralax) should be taken according to package directions if there are no bowel movements after 48 hours. 7. Unless discharge instructions indicate otherwise, you may remove your bandages 24-48 hours after surgery, and you may shower at that time.  You may have steri-strips (small skin tapes) in place directly over the incision.  These strips should be left on the skin for 7-10 days.  If your surgeon used skin glue on the  incision, you may shower in 24 hours.  The glue will flake off over the next 2-3 weeks.  Any sutures or staples will be removed at the office during your follow-up visit. 8. ACTIVITIES:  You may resume regular (light) daily activities beginning the next day--such as daily self-care, walking, climbing stairs--gradually increasing activities as tolerated.  You may have sexual intercourse when it is comfortable.  Refrain from any heavy lifting or straining until approved by your doctor.  a.You may drive when you are no longer taking prescription pain medication, you can comfortably wear a seatbelt, and you can safely maneuver your car and apply brakes. b.RETURN TO WORK:   _____________________________________________  9.You should see your doctor in the office for a follow-up appointment approximately 2-3 weeks after your surgery.  Make sure that you call for this appointment within a day or two after you arrive home to insure a convenient appointment time. 10.OTHER INSTRUCTIONS: _YOU MAY SHOWER STARTING TOMORROW TYLENOL AND ICE PACK FOR PAIN NO LIFTING MORE THAN 15 POUNDS FOR 4 WEEKS________________________    _____________________________________  WHEN TO CALL YOUR DOCTOR: Fever over 101.0 Inability to urinate Nausea and/or vomiting Extreme swelling or bruising Continued bleeding from incision. Increased pain, redness, or drainage from the incision  The clinic staff is available to answer your questions during regular business hours.  Please don't hesitate to call and ask to speak to one of the nurses for clinical concerns.  If you have a medical emergency, go to the nearest emergency room or call 911.  A surgeon  from [facility] is always on call at the hospital   [address], [place], [place]  [address] ?  [address] [address], [place], [place]   [address] [phone] ? [phone] ? FAX [phone] Web site: [email or web address]

## 2023-09-24 NOTE — Op Note (Signed)
   Wayne [name] 09/24/2023   Pre-op Diagnosis: LEFT INGUINAL HERNIA     Post-op Diagnosis: same  Procedure(s): OPEN LEFT HERNIA REPAIR INGUINAL  WITH MESH  Surgeon(s): Abigail Miyamoto, MD  Anesthesia: Choice  Staff:  Circulator: Ivin Booty, RN Scrub Person: Hermelinda Dellen, RN; Newell Coral, Marilu Favre, RN  Estimated Blood Loss: Minimal               Findings: The patient was found to have a direct as well as an indirect left inguinal hernia.  The hernias were repaired with a left-sided Prolene ProGrip mesh from [employer]  Procedure: The patient was brought to the operating identifies the correct patient.  He was placed upon the operating table and general anesthesia was induced.  His abdomen was prepped and draped in usual sterile fashion.  He had received a preoperative tap block by anesthesiology.  I anesthetized the skin in the left inguinal area with Marcaine and then made a longitudinal incision with a scalpel.  I took this down through Scarpa's fascia with electrocautery.  The external oblique fascia was then opened toward the internal and external rings.  I controlled the patient's testicular cord and structures with a Penrose drain.  The patient had a moderate-sized direct left inguinal hernia and a smaller indirect hernia.  All contents had been reduced from both hernia sacs.  I dissected the indirect sac down to its base and tied off with a 2-0 silk suture.  I then excised the redundant sac.  I then imbricated the indirect sac and closed the floor of the inguinal canal over the top of the defect with interrupted 2-0 silk sutures.  Next a large piece of Prolene ProGrip mesh was placed onto the inguinal floor.  I placed against pubic tubercle and then brought around the cord structures.  Wide coverage of the inguinal floor and internal ring appeared to be achieved.  I sutured the mesh in place with a single 2-0 Vicryl suture to the pubic tubercle.  I  then closed the external oblique fascia over the top of the mesh with a running 2-0 Vicryl suture.  Scarpa's fascia was then closed interrupted 3-0 Vicryl sutures and the skin was closed with running 4-0 Monocryl.  Dermabond was then applied.  The patient tolerated the procedure.  All the counts were correct at the end of the procedure.  He was then extubated in the operating room and taken in a stable condition to the recovery room.          Abigail Miyamoto   Date: 09/24/2023  Time: 10:37 AM

## 2023-09-24 NOTE — Anesthesia Procedure Notes (Signed)
Procedure Name: LMA Insertion Date/Time: 09/24/2023 10:07 AM  Performed by: Camillia Herter, CRNAPre-anesthesia Checklist: Patient identified, Emergency Drugs available, Suction available and Patient being monitored Patient Re-evaluated:Patient Re-evaluated prior to induction Oxygen Delivery Method: Circle System Utilized Preoxygenation: Pre-oxygenation with 100% oxygen Induction Type: IV induction Ventilation: Mask ventilation without difficulty LMA: LMA inserted LMA Size: 4.0 Number of attempts: 1 Airway Equipment and Method: Bite block Placement Confirmation: positive ETCO2 Tube secured with: Tape Dental Injury: Teeth and Oropharynx as per pre-operative assessment

## 2023-09-24 NOTE — Transfer of Care (Addendum)
Immediate Anesthesia Transfer of Care Note  Patient: Wayne [name]  Procedure(s) Performed: LEFT HERNIA REPAIR INGUINAL ADULT WITH MESH (Left)  Patient Location: PACU  Anesthesia Type:General  Level of Consciousness: awake and drowsy  Airway & Oxygen Therapy: Patient Spontanous Breathing and Patient connected to face mask oxygen  Post-op Assessment: Report given to RN and Post -op Vital signs reviewed and stable  Post vital signs: Reviewed and stable  Last Vitals:  Vitals Value Taken Time  BP 116/57 09/24/23 1050  Temp    Pulse 49 09/24/23 1053  Resp 9 09/24/23 1053  SpO2 100 % 09/24/23 1053  Vitals shown include unfiled device data.  Last Pain:  Vitals:   09/24/23 0810  PainSc: 0-No pain         Complications: No notable events documented.

## 2023-09-25 ENCOUNTER — Encounter (HOSPITAL_COMMUNITY): Payer: Self-pay | Admitting: Surgery

## 2023-09-26 ENCOUNTER — Other Ambulatory Visit: Payer: Self-pay | Admitting: *Deleted

## 2023-09-26 MED ORDER — ROPIVACAINE HCL 5 MG/ML IJ SOLN
INTRAMUSCULAR | Status: DC | PRN
  Administered 2023-09-24: 20 mL via PERINEURAL

## 2023-09-26 MED ORDER — BUPIVACAINE LIPOSOME 1.3 % IJ SUSP
INTRAMUSCULAR | Status: DC | PRN
  Administered 2023-09-24: 10 mL via PERINEURAL

## 2023-09-26 NOTE — Patient Outreach (Signed)
Aging Gracefully Program  09/26/2023  [name] Jun 13, 1926 [id number]  Telephone call made to daughter Wayne [name] to schedule Aging Gracefully  first RN home visit. Mrs. Wayne [name] reports [name] had hernia surgery this week and he is recovering. Home visit scheduled for Thursday, Nov. 7th at 1 pm. Clinical research associate provided contact information to Mrs. Wayne [name] to call if there are any changes to plans.   [name], MSN, RN, BSN RN Case Production designer, theatre/television/film for Aging Brewing technologist Health  [facility] Direct Dial: [phone]

## 2023-09-26 NOTE — Anesthesia Procedure Notes (Signed)
Anesthesia Regional Block: TAP block   Pre-Anesthetic Checklist: , timeout performed,  Correct Patient, Correct Site, Correct Laterality,  Correct Procedure, Correct Position, site marked,  Risks and benefits discussed,  Surgical consent,  Pre-op evaluation,  At surgeon's request and post-op pain management  Laterality: Left  Prep: Maximum Sterile Barrier Precautions used, chloraprep       Needles:  Injection technique: Single-shot  Needle Type: Echogenic Stimulator Needle     Needle Length: 9cm  Needle Gauge: 22     Additional Needles:   Procedures:,,,, ultrasound used (permanent image in chart),,    Narrative:  Start time: 09/24/2023 9:25 AM End time: 09/24/2023 9:30 AM Injection made incrementally with aspirations every 5 mL.  Performed by: Personally  Anesthesiologist: Lannie Fields, DO  Additional Notes: Monitors applied. No increased pain on injection. No increased resistance to injection. Injection made in 5cc increments. Good needle visualization. Patient tolerated procedure well.

## 2023-09-26 NOTE — Addendum Note (Signed)
Addendum  created 09/26/23 1811 by Lannie Fields, DO   Child order released for a procedure order, Clinical Note Signed, Intraprocedure Blocks edited, Intraprocedure Meds edited, SmartForm saved

## 2023-10-10 ENCOUNTER — Encounter: Payer: Self-pay | Admitting: *Deleted

## 2023-10-10 ENCOUNTER — Other Ambulatory Visit: Payer: Self-pay | Admitting: *Deleted

## 2023-10-10 NOTE — Patient Outreach (Signed)
Aging Gracefully Program  RN Visit  10/10/2023  Wayne [name] 30-Apr-1926 [id number]  Visit:  RN Visit Number: 1- Initial Visit  RN TIME CALCULATION: Start TIme:  RN Start Time Calculation: 1200 End Time:  RN Stop Time Calculation: 1342 Total Minutes:  RN Time Calculation: 102  Readiness To Change Score:  Readiness to Change Score: 8.67  Universal RN Interventions: Calendar Distribution: Yes Exercise Review: Yes Medications: Yes Medication Changes: No Mood: Yes Pain: Yes PCP Advocacy/Support: No Fall Prevention: Yes Incontinence: Yes Clinician View Of Wayne [name] Situation: Writer arrived to Wayne [name]' home. Family friend, Wayne [name] present during home visit. Wayne [name] ambulating with rollator. Home clean, tidy, and clutter free. Wayne [name] alert and oriented x3 with pleasant demeanor. Wayne [name] View Of His/Her Situation: Wayne [name] reports he lives alone. Reports he is "always in pain" in his ankles and feet, States he uses gel, pain medications, and rest for pain relief. Noted bilateral ankle swelling. Wayne [name] reports he lives alone independently and still drives. Reports a fall a month ago at the barber shop. Denies injury. States he missed a step. Reports [employer] repaired his furnace.  Healthcare Provider Communication: Did Surveyor, mining With [employer] Provider?: No Healthcare Provider Response According to RN: n/a According to Wayne [name], Did PCP Report Communication With An Aging Gracefully RN?: No Healthcare Provider Response According To Wayne [name]: n/a  Clinician View of Wayne [name] Situation: Clinician View Of Wayne [name] Situation: Writer arrived to Mr. Harkin' home. Family friend, Wayne [name] present during home visit. Mr. Yeo ambulating with rollator. Home clean, tidy, and clutter free. Mr. Spikes alert and oriented x3 with pleasant demeanor. Wayne [name]'s View of His/Her Situation: Wayne [name] View Of His/Her Situation: Wayne [name] reports he lives alone. Reports he  is "always in pain" in his ankles and feet, States he uses gel, pain medications, and rest for pain relief. Noted bilateral ankle swelling. Wayne [name] reports he lives alone independently and still drives. Reports a fall a month ago at the barber shop. Denies injury. States he missed a step. Reports [employer] repaired his furnace.  Medication Assessment: Do You Have Any Problems Paying For Medications?: No Where Does Wayne [name] Store Medications?: Cabinet Can Wayne [name] Read Pill Bottles?: Yes Does Wayne [name] Use A Pillbox?: No Does Anyone Assist Wayne [name] In Taking Medications?: No Do You Take Vitamin D?: Yes Total Number Of Medications That The Wayne [name] Takes: 12 Does Wayne [name] Have Any Questions Or Concerns About Medictions?: No Is Wayne [name] Complaining Of Any Symptoms That Could Be Side Effects To Medications?: No Any Possible Changes In Medication Regimen?: No  Outpatient Encounter Medications as of 10/10/2023  Medication Sig Note   amLODipine (NORVASC) 5 MG tablet Take 1 tablet (5 mg total) by mouth daily.    aspirin EC 81 MG tablet Take 81 mg by mouth daily.    atorvastatin (LIPITOR) 20 MG tablet TAKE M,W,F ONLY.    furosemide (LASIX) 40 MG tablet Take 40 mg by mouth 2 (two) times daily.    gabapentin (NEURONTIN) 300 MG capsule Take 1 capsule (300 mg total) by mouth 2 (two) times daily.    OVER THE COUNTER MEDICATION Take 1 tablet by mouth daily. Focus factor    oxyCODONE-acetaminophen (PERCOCET) 7.5-325 MG tablet Take 1-2 tablets by mouth daily as needed for severe pain (pain score 7-10).    senna (SENOKOT) 8.6 MG tablet Take 1 tablet by mouth daily.    tamsulosin (FLOMAX) 0.4 MG CAPS capsule Take 0.4 mg by mouth daily.  timolol (TIMOPTIC) 0.5 % ophthalmic solution Place 1 drop into both eyes daily.    trolamine salicylate (BLUE-EMU MAXIMUM PAIN RELIEF) 10 % cream Apply 1 Application topically daily at 12 noon.    valsartan (DIOVAN) 80 MG tablet Take 80 mg by mouth daily.    Emu Oil  OIL by Does not apply route.    furosemide (LASIX) 40 MG tablet Take 40 mg by mouth daily. (Patient not taking: Reported on 10/10/2023)    lidocaine (LIDODERM) 5 % Place 1 patch onto the skin daily. Remove & Discard patch within 12 hours or as directed by MD (Patient not taking: Reported on 09/19/2023)    Multiple Vitamin (MULTIVITAMIN WITH MINERALS) TABS tablet Take 1 tablet by mouth daily. 50+ (Patient not taking: Reported on 10/10/2023)    Omega-3 Fatty Acids (FISH OIL) 1200 MG CAPS Take 1 capsule by mouth daily.  (Patient not taking: Reported on 10/10/2023)    OVER THE COUNTER MEDICATION Apply 1 application  topically daily as needed (pain). Veterinary cream (Patient not taking: Reported on 10/10/2023)    Saw Palmetto 450 MG CAPS Take 1 capsule by mouth daily. (Patient not taking: Reported on 10/10/2023) 09/19/2023: Ran out   traMADol (ULTRAM) 50 MG tablet Take 1 tablet (50 mg total) by mouth every 6 (six) hours as needed for moderate pain (pain score 4-6) or severe pain (pain score 7-10). (Patient not taking: Reported on 10/10/2023)    No facility-administered encounter medications on file as of 10/10/2023.     OT Update: pending [employer] and contracts  Session Summary: Wayne [name] lives independently alone at home. He manages his medications and still drives at times. Has supportive children and family friend. Wayne [name] has life alert system. Ambulates with rollator and has 2 canes in the home.    Goals Addressed               This Visit's Progress     AG RN (pt-stated)        Goal: Decrease in pain and in pain frequency over the next 160 days   10/10/23   Assessment: Wayne [name] reports having pain all of the time. Reports rest and Oxycodone helps with his pain. Reports he uses medicated gel daily that helps with pain relief as well. Reports pain is worse with movement.   Intervention: Discussed pain relief strategies. Provided [facility] calendar book along with  writer's contact information. Also provided Jasmine December writer's contact information.  Plan: Scheduled next home visit with AG RN for 11/14/23 at 12 noon.   Wayne [name]/RN ACTION PLAN - PAIN  Registered Nurse:  [name] Date: 10/10/2023  Wayne [name] Name: Wayne [name] Wayne [name] ID:    Target Area:  PAIN   Why Problem May Occur: Because of arthritis   Target Goal: Decrease in pain level and frequency over the next 160 days  STRATEGIES Coping Strategies Ideas:  Heat  Reviewed 10/10/23 Use heating pad or warm towel on painful area no more than 20 minutes at a time. Don't sleep with a heating pad on - it could burn your skin   Ice  N/a Use ice pack or frozen bag of vegetables on painful area.   Leave cold pack on for less than 20 minutes. Ice can burn your skin.  Don't leave ice on longer than 20 minutes.   Activity and Exercise   Reviewed 10/10/23 Joints get stiff when not in use Aging Gracefully Exercises Walking (inside or outside) Dancing -- N/A Gardening- N/A Housework:  cooking, cleaning, laundry- N/A Swimming- N/A   Listen to Music  N/A Listening to music can decrease pain. Turn off the TV and turn on the radio.   Prayer/Meditation N/A Prayer and meditation can decrease pain   Other   Acetaminophen/Tylenol (same medication)  Reviewed 10/10/23 DO NOT take more Acetaminophen than below because it can be bad for your liver. 500 mg. tablets:  2 tablets every 8 hours, as needed for pain.  Do not take more than 6, (500 mg) tablets every day. 300 mg. tablets:  2 tablets every 6 hours, as needed for pain.  Do not take more than 8 (325 mg) tablets every day. Look for Acetaminophen/Tylenol in other medicines you buy over the counter.   Still in Pain  Reviewed 10/10/23 There ae a lot of different kinds of pain medicines:  creams, patches and supplements. Ask your Healthcare Provider about other pain medications.   Stop Smoking- N/A Smoking can make arthritis worse.   Stop  Pain  Reviewed 10/10/23 Before it gets bad. Once in pain It is harder to get rid of. Begin pain relief while you have mild pain.   Other   Other    ;  PRACTICE It is important to practice the strategies so we can determine if they will be effective in helping to reach the goal.    Follow these specific recommendations:        If strategy does not work the first time, try it again.  We may make some changes over the next few sessions.    We may make some changes over the next few sessions, based on how they work.    [name], MSN, RN, BSN RN Case Production designer, theatre/television/film for Aging Brewing technologist Health  [facility] Direct Dial: [phone]                                   [name], MSN, RN, BSN RN Case Manager for Aging Gracefully [facility] Health  [facility] Direct Dial: [phone]

## 2023-10-10 NOTE — Patient Instructions (Signed)
Visit Information  Thank you for taking time to visit with me today. Please don't hesitate to contact me if I can be of assistance to you before our next scheduled home appointment.  Following are the goals we discussed today:    Our next appointment is on 11/14/23 at 12 noon  If you are experiencing a Mental Health or Behavioral Health Crisis or need someone to talk to, please call the Suicide and Crisis Lifeline: 988 call the [place] National Suicide Prevention Lifeline: 934-391-4579 or TTY: 240-753-8799 TTY (251) 309-3362) to talk to a trained counselor call 1-800-273-TALK (toll free, 24 hour hotline) go to [facility] [address], [place] [phone]) call 911   The patient verbalized understanding of instructions, educational materials, and care plan provided today and agreed to receive a mailed copy of patient instructions, educational materials, and care plan.    Goals Addressed               This Visit's Progress     AG RN (pt-stated)        Goal: Decrease in pain and in pain frequency over the next 160 days   10/10/23   Assessment: Client reports having pain all of the time. Reports rest and Oxycodone helps with his pain. Reports he uses medicated gel daily that helps with pain relief as well. Reports pain is worse with movement.   Intervention: Discussed pain relief strategies. Provided [facility] calendar book along with writer's contact information. Also provided Jasmine December writer's contact information.  Plan: Scheduled next home visit with AG RN for 11/14/23 at 12 noon.   CLIENT/RN ACTION PLAN - PAIN  Registered Nurse:  [name] Date: 10/10/2023  Client Name: Wayne [name] Client ID:    Target Area:  PAIN   Why Problem May Occur: Because of arthritis   Target Goal: Decrease in pain level and frequency over the next 160 days  STRATEGIES Coping Strategies Ideas:  Heat  Reviewed 10/10/23 Use heating pad or warm towel on  painful area no more than 20 minutes at a time. Don't sleep with a heating pad on - it could burn your skin   Ice  N/a Use ice pack or frozen bag of vegetables on painful area.   Leave cold pack on for less than 20 minutes. Ice can burn your skin.  Don't leave ice on longer than 20 minutes.   Activity and Exercise   Reviewed 10/10/23 Joints get stiff when not in use Aging Gracefully Exercises Walking (inside or outside) Dancing -- N/A Gardening- N/A Housework:  cooking, cleaning, laundry- N/A Swimming- N/A   Listen to Music  N/A Listening to music can decrease pain. Turn off the TV and turn on the radio.   Prayer/Meditation N/A Prayer and meditation can decrease pain   Other   Acetaminophen/Tylenol (same medication)  Reviewed 10/10/23 DO NOT take more Acetaminophen than below because it can be bad for your liver. 500 mg. tablets:  2 tablets every 8 hours, as needed for pain.  Do not take more than 6, (500 mg) tablets every day. 300 mg. tablets:  2 tablets every 6 hours, as needed for pain.  Do not take more than 8 (325 mg) tablets every day. Look for Acetaminophen/Tylenol in other medicines you buy over the counter.   Still in Pain  Reviewed 10/10/23 There ae a lot of different kinds of pain medicines:  creams, patches and supplements. Ask your Healthcare Provider about other pain medications.   Stop  Smoking- N/A Smoking can make arthritis worse.   Stop Pain  Reviewed 10/10/23 Before it gets bad. Once in pain It is harder to get rid of. Begin pain relief while you have mild pain.   Other   Other    ;  PRACTICE It is important to practice the strategies so we can determine if they will be effective in helping to reach the goal.    Follow these specific recommendations:        If strategy does not work the first time, try it again.  We may make some changes over the next few sessions.    We may make some changes over the next few sessions, based on how they  work.    [name], MSN, RN, BSN RN Case Production designer, theatre/television/film for Aging Brewing technologist Health  [facility] Direct Dial: [phone]

## 2023-10-25 ENCOUNTER — Other Ambulatory Visit: Payer: Self-pay | Admitting: Rehabilitation

## 2023-10-25 NOTE — Patient Outreach (Signed)
Aging Gracefully Program  OT Follow-Up Visit  10/25/2023  Wayne [name] 02-13-1926 [id number]  Visit:  2- Second Visit  Start Time:  1300 End Time:  1400 Total Minutes:  60  Readiness to Change Score :  Readiness to Change Score: 9.33  Home Environment Assessment:    Durable Medical Equipment:  None at this time  Patient Education: Education Provided: Yes Education Details: How to get up from a fall. Person(s) Educated: Patient Comprehension: Verbalized Understanding  Goals:   Goals Addressed             This Visit's Progress    Patient Stated       Improve safety and mobility in the bathroom 10/25/23: client agreed to a tub cut out, adding grab bar and hand held shower to assist ease of transferring in and out of the tub. Discussed and agreed on a shower seat with handles to assist sit to stand transfers. Awaiting construction modifications     Patient Stated       Improve safety and mobility walking in/out of the house 10/25/23: client reports they chose to wait to add a ramp. [facility] is able to add handrail to assist walking up and down stairs.        Post Clinical Reasoning: Did Client Try?: No Reason Client Did Not Try?:  (waiting modifications for tub) Targeted Problem Area Status: The Same Did Client Try?: No (awaiting hand rail) Targeted Problem Area Status: The Same Clinician View Of Client Situation:: Client verbalizes he knows what he can do and then he lets others help when needed. Able to discuss choice for the tub cut out, with added grab bar, hand held shower. receptive to new shower chair with arm rests. Client View Of His/Her Situation:: Client seems aware of limitations. Has daily pain. Already using energy conservation strategies laying out needed materials. Recognizes need for some supports. Able to explain his past surgeries and difficulty wtih his equilibrium which adversey impacts his balance. Next Visit Plan:: Awaiting modification then  can add new shower chair with arm rests.

## 2023-11-11 DIAGNOSIS — N1832 Chronic kidney disease, stage 3b: Secondary | ICD-10-CM | POA: Diagnosis not present

## 2023-11-14 ENCOUNTER — Encounter: Payer: Self-pay | Admitting: *Deleted

## 2023-11-21 ENCOUNTER — Other Ambulatory Visit: Payer: Medicare Other | Admitting: Rehabilitation

## 2023-11-21 ENCOUNTER — Encounter: Payer: Self-pay | Admitting: *Deleted

## 2023-11-22 ENCOUNTER — Encounter: Payer: Self-pay | Admitting: *Deleted

## 2023-11-22 ENCOUNTER — Encounter: Payer: Medicare Other | Admitting: Rehabilitation

## 2023-11-22 ENCOUNTER — Other Ambulatory Visit: Payer: Self-pay | Admitting: *Deleted

## 2023-11-22 NOTE — Patient Outreach (Signed)
Aging Gracefully Program  [name] Visit  11/22/2023  Wayne [name] 06/24/1926 [id number]  Visit:  [name] Visit Number: 2- Second Visit  [name] TIME CALCULATION: Start TIme:  [name] Start Time Calculation: 1300 End Time:  [name] Stop Time Calculation: 1400 Total Minutes:  [name] Time Calculation: 60  Readiness To Change Score:  Readiness to Change Score: 8.67  Universal [name] Interventions: Calendar Distribution: No Exercise Review: Yes Medications: Yes Medication Changes: Yes Mood: Yes Pain: Yes PCP Advocacy/Support: No Fall Prevention: Yes Incontinence: Yes Clinician View Of Wayne [name] Situation: Arrived for home visit. Wayne [name] answered the door. Ambulates with rollator. Home emaculent and clear of clutter. Wayne [name] View Of His/Her Situation: Wayne [name] reports his arthritis has been bothering him as of late. Rates pain 8 out of 10. Denies any recent falls. Reports Wayne [name] has completed bathroom shower modifications.  Healthcare Provider Communication: Did Wayne [name], Wayne [name] With Wayne [name] Provider?: No Healthcare Provider Response According to [name]: n/a According to Wayne [name], Did PCP Report Communication With An Aging Gracefully [name]?: No Healthcare Provider Response According To Wayne [name]: n/a  Clinician View of Wayne [name] Situation: Clinician View Of Wayne [name] Situation: Arrived for home visit. Wayne [name] answered the door. Ambulates with rollator. Home emaculent and clear of clutter. Wayne [name]'s View of His/Her Situation: Wayne [name] View Of His/Her Situation: Wayne [name] reports his arthritis has been bothering him as of late. Rates pain 8 out of 10. Denies any recent falls. Reports Wayne [name] has completed bathroom shower modifications.  Medication Assessment: Reviewed. No changes.     OT Update: Pending [employer] assessments and contracts  Session Summary: Wayne [name] reports pain 8 out of 10. Not much relief from pain strategies. States he does some of the demonstrated  home exercises regularly. States he learned the exercises from going to the Wayne [name] in the past. States he will consider contacting PCP regarding pain medication adjustments.    Goals Addressed               This Visit's Progress     Wayne [name] (pt-stated)        Goal: Decrease in pain and in pain frequency over the next 160 days   10/10/23   Assessment: Wayne [name] reports having pain all of the time. Reports rest and Oxycodone helps with his pain. Reports he uses medicated gel daily that helps with pain relief as well. Reports pain is worse with movement.   Intervention: Discussed pain relief strategies. Provided Wayne [name] calendar book along with writer's contact information. Also provided Wayne [name] writer's contact information.  [name]: Scheduled next home visit with Wayne [name] for 11/14/23 at 12 noon.   Wayne [name] - PAIN  Registered Nurse:  Wayne [name] Date: 10/10/2023  Wayne [name] Name: Wayne [name] Wayne [name] ID:    Target Area:  PAIN   Why Problem May Occur: Because of arthritis   Target Goal: Decrease in pain level and frequency over the next 160 days  STRATEGIES Coping Strategies Ideas:  Heat  Reviewed 10/10/23  Reviewed 11/22/23 Use heating pad or warm towel on painful area no more than 20 minutes at a time. Don't sleep with a heating pad on - it could burn your skin   Ice  N/a  Reviewed 11/22/23 Use ice pack or frozen bag of vegetables on painful area.   Leave cold pack on for less than 20 minutes. Ice can burn your skin.  Don't leave ice on longer than 20 minutes.   Activity and Exercise   Reviewed 10/10/23  Reviewed home exercises and exercise booklet provided 11/22/23 Joints get stiff when not in use Aging Gracefully Exercises Walking (inside or outside) Dancing -- N/A Gardening- N/A Housework:  cooking, cleaning, laundry- N/A Swimming- N/A   Listen to Music  N/A Listening to music can decrease pain. Turn off the TV and turn on the radio.   Prayer/Meditation N/A  Prayer and meditation can decrease pain   Other   Acetaminophen/Tylenol (same medication)  Reviewed 10/10/23  Reviewed 11/22/23 DO NOT take more Acetaminophen than below because it can be bad for your liver. 500 mg. tablets:  2 tablets every 8 hours, as needed for pain.  Do not take more than 6, (500 mg) tablets every day. 300 mg. tablets:  2 tablets every 6 hours, as needed for pain.  Do not take more than 8 (325 mg) tablets every day. Look for Acetaminophen/Tylenol in other medicines you buy over the counter.   Still in Pain  Reviewed 10/10/23  Reviewed 11/22/23 There ae a lot of different kinds of pain medicines:  creams, patches and supplements. Ask your Healthcare Provider about other pain medications.   Stop Smoking- N/A Smoking can make arthritis worse.   Stop Pain  Reviewed 10/10/23 Reviewed 11/22/23 Before it gets bad. Once in pain It is harder to get rid of. Begin pain relief while you have mild pain.   Other   Other    ;  PRACTICE It is important to practice the strategies so we can determine if they will be effective in helping to reach the goal.    Follow these specific recommendations:        If strategy does not work the first time, try it again.  We may make some changes over the next few sessions.    We may make some changes over the next few sessions, based on how they work.    Wayne Noble, MSN, [name], BSN [name] Case Manager for Aging Gracefully [place]  [name] Direct Dial: [phone]        11/22/2023  Assessment: Wayne [name] reports pain 8 out of 10. Endorses he has a high pain tolerance. States ankle brace, creams, and pain medication sometimes help with pain. However, his pain usually does not go away. Reports chronic pain is due to arthritis. Denies any recent falls. Continues to wear medical alert and uses rollator.   Interventions: Performed home exercises and provided exercise booklet. Wayne [name] did some return  demonstration of the seated exercises. Discussed fall precautions. Encouraged Wayne [name] to contact PCP about possible pain medication adjustments due to unrelieved pain.   [name]: Scheduled next home visit for January 21st at 12 noon.   Wayne Noble, MSN, [name], BSN [name] Case Production designer, theatre/television/film for Aging Brewing technologist Health  [name] Direct Dial: [phone]                                                        Wayne Noble, MSN, [name], BSN [name] Case Manager for Aging Gracefully [name]  [facility] Direct Dial: [phone]

## 2023-11-22 NOTE — Patient Outreach (Signed)
Aging Gracefully Program  OT Follow-Up Visit  11/22/2023  Wayne [name] [birth year] [id number]  Visit:  3- Third Visit  Start Time:  1735 End Time:  1800 Total Minutes:  25  Readiness to Change Score :  Readiness to Change Score: 9.33  Home Environment Assessment:    Durable Medical Equipment:  Will continue to use current shower chair and existing grab bars.  Patient Education: Education Provided: Yes Education Details: Practice enter and exit Special educational needs teacher) Educated: Patient Comprehension: Verbalized Understanding, Returned Demonstration  Goals:   Goals Addressed             This Visit's Progress    Patient Stated   On track    Improve safety and mobility in the bathroom 10/25/23: client agreed to a tub cut out, adding grab bar and hand held shower to assist ease of transferring in and out of the tub. Discussed and agreed on a shower seat with handles to assist sit to stand transfers. Awaiting construction modifications 11/21/23: client now has modification to his tub with a "cut out". He is able to step in and out of the tub safely using both hands on the grab bars. We agreed to keep his current tub chair. Discussed management of the new shower wand.    Name __________________________________   Date________________  OT ACTION PLAN: Bathing  Target Problem Area:  Accessing the tub   Why Problem May Occur:     Original tub too high for decreased ROM of L knee.   Needs a hand held shower to better guide and control water flow  Transfer out of tub using shower chair was laborious        Target Goal(s):  Improved safety in and out of shower    STRATEGIES   Saving Your Energy DO:  Use a tub bench/seat   Use a long-handled sponge  Keep frequently used items within easy reach  (Other): Use both hands on the grab bar while stepping in and out   (Other):position shower wand wand from shower curtain and keep water on while showering to maintain  the correct water temperature   (Other):     Modifying your home environment and making it safe     DO:  Install grab bars in the shower. We dicussed adding a lower grab bar, but client states he likes what he has  Continue to use your rubber mat the entire length of the tub  Make sure the bathroom is well-lit  (Other):have towels close by   (Other): sit for dressing   (Other):    Simplifying the way you set up tasks or daily routines DO:  Plan to bathe/shower before you're overly tired  Gather everything you will need before beginning   (Other):   (Other):   (Other):    PRACTICE  Based on what we have talked about, you are willing to try:  stepping in and out of tub cut-out, both hands on grab bars__  Practice use of shower wand_______  If an idea does not work the first time, try it again (and again).  We may make some changes over the next few sessions, based on how they work.         Post Clinical Reasoning: Did Client Try?: Yes (Client able to demonstrate stepping in and out of tub using grab bars.) Reason Client Did Not Try?: Other Did Client Try?: Yes Targeted Problem Area Status: A Little Better Clinician View Of Client Situation:: Client is  able to safely step in and out of the tub with the new cut out. Using existing grab bars and demonstrate 2 ways. Discussed adding a lower grab bar, but client denies need or interest for that. Needs more practice  with the new hand held shower. Client View Of His/Her Situation:: Client reported to OT over the phone that he did not care for the tub cut out but is working to get used to it. With demonstration of entering and exiting the tub during this visit, he demonstrates confidence and ease. Client reports that can always find a way to make something work and he is practicing with this new set up. Next Visit Plan:: OT will return in a few weeks for final visit.

## 2023-11-22 NOTE — Patient Instructions (Signed)
Visit Information  Thank you for taking time to visit with me today. Please don't hesitate to contact me if I can be of assistance to you before our next scheduled home appointment.  Following are the goals we discussed today:  (Copy and paste patient goals from clinical care plan here)  Our next appointment is on January 21st  at 12 noon.  If you are experiencing a Mental Health or Behavioral Health Crisis or need someone to talk to, please call the Suicide and Crisis Lifeline: 988 call the [place] National Suicide Prevention Lifeline: 623-180-5290 or TTY: 902-743-4973 TTY 925 486 4620) to talk to a trained counselor call 1-800-273-TALK (toll free, 24 hour hotline) go to [facility] [address] [place] [phone]) call 911   The patient verbalized understanding of instructions, educational materials, and care plan provided today and agreed to receive a mailed copy of patient instructions, educational materials, and care plan.    Goals Addressed               This Visit's Progress     AG RN (pt-stated)        Goal: Decrease in pain and in pain frequency over the next 160 days   10/10/23   Assessment: Client reports having pain all of the time. Reports rest and Oxycodone helps with his pain. Reports he uses medicated gel daily that helps with pain relief as well. Reports pain is worse with movement.   Intervention: Discussed pain relief strategies. Provided [facility] calendar book along with writer's contact information. Also provided Jasmine December writer's contact information.  Plan: Scheduled next home visit with AG RN for 11/14/23 at 12 noon.   CLIENT/RN ACTION PLAN - PAIN  Registered Nurse:  [name] Date: 10/10/2023  Client Name: Wayne [name] Client ID:    Target Area:  PAIN   Why Problem May Occur: Because of arthritis   Target Goal: Decrease in pain level and frequency over the next 160 days  STRATEGIES Coping Strategies Ideas:   Heat  Reviewed 10/10/23  Reviewed 11/22/23 Use heating pad or warm towel on painful area no more than 20 minutes at a time. Don't sleep with a heating pad on - it could burn your skin   Ice  N/a  Reviewed 11/22/23 Use ice pack or frozen bag of vegetables on painful area.   Leave cold pack on for less than 20 minutes. Ice can burn your skin.  Don't leave ice on longer than 20 minutes.   Activity and Exercise   Reviewed 10/10/23  Reviewed home exercises and exercise booklet provided 11/22/23 Joints get stiff when not in use Aging Gracefully Exercises Walking (inside or outside) Dancing -- N/A Gardening- N/A Housework:  cooking, cleaning, laundry- N/A Swimming- N/A   Listen to Music  N/A Listening to music can decrease pain. Turn off the TV and turn on the radio.   Prayer/Meditation N/A Prayer and meditation can decrease pain   Other   Acetaminophen/Tylenol (same medication)  Reviewed 10/10/23  Reviewed 11/22/23 DO NOT take more Acetaminophen than below because it can be bad for your liver. 500 mg. tablets:  2 tablets every 8 hours, as needed for pain.  Do not take more than 6, (500 mg) tablets every day. 300 mg. tablets:  2 tablets every 6 hours, as needed for pain.  Do not take more than 8 (325 mg) tablets every day. Look for Acetaminophen/Tylenol in other medicines you buy over the counter.   Still in Pain  Reviewed 10/10/23  Reviewed 11/22/23 There ae a lot of different kinds of pain medicines:  creams, patches and supplements. Ask your Healthcare Provider about other pain medications.   Stop Smoking- N/A Smoking can make arthritis worse.   Stop Pain  Reviewed 10/10/23 Reviewed 11/22/23 Before it gets bad. Once in pain It is harder to get rid of. Begin pain relief while you have mild pain.   Other   Other    ;  PRACTICE It is important to practice the strategies so we can determine if they will be effective in helping to reach the goal.    Follow  these specific recommendations:        If strategy does not work the first time, try it again.  We may make some changes over the next few sessions.    We may make some changes over the next few sessions, based on how they work.    [name], MSN, RN, BSN RN Case Manager for Aging Gracefully [place]  [facility] Direct Dial: [phone]        11/22/2023  Assessment: Wayne [name] reports pain 8 out of 10. Endorses he has a high pain tolerance. States ankle brace, creams, and pain medication sometimes help with pain. However, his pain usually does not go away. Reports chronic pain is due to arthritis. Denies any recent falls. Continues to wear medical alert and uses rollator.   Interventions: Performed home exercises and provided exercise booklet. Wayne [name] did some return demonstration of the seated exercises. Discussed fall precautions. Encouraged Wayne [name] to contact PCP about possible pain medication adjustments due to unrelieved pain.   Plan: Scheduled next home visit for January 21st at 12 noon.   [name], MSN, RN, BSN RN Case Production designer, theatre/television/film for Aging Brewing technologist Health  [facility] Direct Dial: [phone]

## 2023-12-13 ENCOUNTER — Other Ambulatory Visit: Payer: Self-pay | Admitting: Rehabilitation

## 2023-12-13 NOTE — Patient Outreach (Signed)
 Aging Gracefully Program  OT FINAL Visit  12/13/2023  Wayne [name] 09/14/1926 [id number]  Visit:  4- Fourth Visit  Start Time:  1300 End Time:  1330 Total Minutes:  30  Readiness to Change:  Readiness to Change Score: 9.33  Home Environment Assessment:    Durable Medical Equipment:  None needed  Patient Education: Education Provided: Yes Education Details: Teaching Laboratory Technician. Will mail the Tips booklet, reviewed content. Reviewed process for AG completion Person(s) Educated: Patient Comprehension: Verbalized Understanding  Goals:  Goals Addressed             This Visit's Progress    COMPLETED: Patient Stated       Improve safety and mobility in the bathroom 10/25/23: client agreed to a tub cut out, adding grab bar and hand held shower to assist ease of transferring in and out of the tub. Discussed and agreed on a shower seat with handles to assist sit to stand transfers. Awaiting construction modifications 11/21/23: client now has modification to his tub with a cut out. He is able to step in and out of the tub safely using both hands on the grab bars. We agreed to keep his current tub chair. Discussed management of the new shower wand.    Name __________________________________   Date________________  OT ACTION PLAN: Bathing  Target Problem Area:  Accessing the tub   Why Problem May Occur:     Original tub too high for decreased ROM of L knee.   Needs a hand held shower to better guide and control water flow  Transfer out of tub using shower chair was laborious        Target Goal(s):  Improved safety in and out of shower    STRATEGIES   Saving Your Energy DO:  Use a tub bench/seat   Use a long-handled sponge  Keep frequently used items within easy reach  (Other): Use both hands on the grab bar while stepping in and out   (Other):position shower wand wand from shower curtain and keep water on while showering to maintain the  correct water temperature   (Other):     Modifying your home environment and making it safe     DO:  Install grab bars in the shower. We dicussed adding a lower grab bar, but client states he likes what he has  Continue to use your rubber mat the entire length of the tub  Make sure the bathroom is well-lit  (Other):have towels close by   (Other): sit for dressing   (Other):    Simplifying the way you set up tasks or daily routines DO:  Plan to bathe/shower before you're overly tired  Gather everything you will need before beginning   (Other):   (Other):   (Other):    PRACTICE  Based on what we have talked about, you are willing to try:  stepping in and out of tub cut-out, both hands on grab bars__  Practice use of shower wand_______  If an idea does not work the first time, try it again (and again).  We may make some changes over the next few sessions, based on how they work.  12/13/23:  Review access into and out of tub. Patient reports no difficulty and no concerns, is more comfortable with the new set up. Discuss energy conservation in relation to the bathroom and dressing.     COMPLETED: Patient Stated       Improve safety and mobility walking in/out of the  house 10/25/23: client reports they chose to wait to add a ramp. [facility] is able to add handrail to assist walking up and down stairs. 12/13/23: Goal met. Client using new handrail for stability and safety walking up and down stairs as well as on the platform into the house. He also reports how he uses it to help him access the trash can.        Post Clinical Reasoning: Client Action (Goal) One Interventions: Improve safety and mobility in the bathroom Did Client Try?: Yes Targeted Problem Area Status: A Lot Better Client Action (Goal) Two Interventions: Improve safety and mobility walking in and out of the house Did Client Try?: No (tried last visit. Reports improved function and safety using the hand  rail. Now has a new door.) Targeted Problem Area Status: A Lot Better Clinician View Of Client Situation:: Client more positive about changes in the bathroom. Able to verbalize safety and mobility improvements. Responsive to energy conservation, handout given today for his reference. Client View Of His/Her Situation:: Client reports that he is good at figuring things out. States he is more comfortable with the new bathroom changes. Reports no concern with height of grab bar in the shower. He is also able to discuss safety using new handrail in/out of side door. Next Visit Plan:: Discharge OT Aginging Gracefully

## 2023-12-16 ENCOUNTER — Other Ambulatory Visit: Payer: Self-pay

## 2023-12-16 MED ORDER — TAMSULOSIN HCL 0.4 MG PO CAPS: 0.4000 mg | ORAL_CAPSULE | Freq: Every day | ORAL | 1 refills | Status: DC

## 2023-12-23 ENCOUNTER — Other Ambulatory Visit: Payer: Self-pay | Admitting: *Deleted

## 2023-12-23 NOTE — Patient Outreach (Signed)
Aging Gracefully Program  12/23/2023  Wayne [name] January 12, 1926 [id number]   Telephone call placed to confirm scheduled home visit with AG RN on tomorrow, 12/24/23 at 12 noon. [name] reports he has a doctor's appointment tomorrow at 73. Writer offered another day next week. [name] states he has a doctor's appointment on 01/02/24 as well. [name] confirms he is available on Tuesday, February 4th at 12noon.   Scheduled next home visit with this AG RN will be Tuesday, Feb. 4th at 12 noon.   [name] has writer's number to call if any changes.   [name], MSN, RN, BSN [place]  [facility] RN Case Manager for Aging Gracefully Direct Dial: [phone]

## 2023-12-24 ENCOUNTER — Encounter: Payer: Self-pay | Admitting: *Deleted

## 2024-01-02 DIAGNOSIS — N2581 Secondary hyperparathyroidism of renal origin: Secondary | ICD-10-CM | POA: Diagnosis not present

## 2024-01-02 DIAGNOSIS — N189 Chronic kidney disease, unspecified: Secondary | ICD-10-CM | POA: Diagnosis not present

## 2024-01-02 DIAGNOSIS — R809 Proteinuria, unspecified: Secondary | ICD-10-CM | POA: Diagnosis not present

## 2024-01-02 DIAGNOSIS — I129 Hypertensive chronic kidney disease with stage 1 through stage 4 chronic kidney disease, or unspecified chronic kidney disease: Secondary | ICD-10-CM | POA: Diagnosis not present

## 2024-01-02 DIAGNOSIS — N261 Atrophy of kidney (terminal): Secondary | ICD-10-CM | POA: Diagnosis not present

## 2024-01-02 DIAGNOSIS — D631 Anemia in chronic kidney disease: Secondary | ICD-10-CM | POA: Diagnosis not present

## 2024-01-02 DIAGNOSIS — N1832 Chronic kidney disease, stage 3b: Secondary | ICD-10-CM | POA: Diagnosis not present

## 2024-01-02 DIAGNOSIS — N2889 Other specified disorders of kidney and ureter: Secondary | ICD-10-CM | POA: Diagnosis not present

## 2024-01-02 DIAGNOSIS — R609 Edema, unspecified: Secondary | ICD-10-CM | POA: Diagnosis not present

## 2024-01-07 ENCOUNTER — Encounter: Payer: Self-pay | Admitting: *Deleted

## 2024-01-07 ENCOUNTER — Other Ambulatory Visit: Payer: Self-pay | Admitting: *Deleted

## 2024-01-07 NOTE — Patient Outreach (Signed)
 Aging Gracefully Program  RN Visit  01/07/2024  Wayne [name] 03-03-1926 [id number]  Visit:  RN Visit Number: 3- Third Visit  RN TIME CALCULATION: Start TIme:  RN Start Time Calculation: 1200 End Time:  RN Stop Time Calculation: 1245 Total Minutes:  RN Time Calculation: 45  Readiness To Change Score:  Readiness to Change Score: 7.67  Universal RN Interventions: Calendar Distribution: No Exercise Review: Yes Medications: Yes Medication Changes: No Mood: Yes Pain: Yes PCP Advocacy/Support: No Fall Prevention: Yes Incontinence: Yes Clinician View Of Client Situation: Arrived for home visit. Wayne [name] answered door. Ambulates with rollator slowly. Home clean and free of clutter. Wayne [name] well groomed. Client View Of His/Her Situation: Wayne [name] reports pain 3/10. Denies any recent falls. However, reports almost following twice. Overall reports he continues to suffer from chronic pain.  Healthcare Provider Communication: Did Surveyor, Mining [employer] Provider?: No Healthcare Provider Response According to RN: n/a According to Client, Did PCP Report Communication With An Aging Gracefully RN?: No Healthcare Provider Response According To Client: n/a  Clinician View of Client Situation: Clinician View Of Client Situation: Arrived for home visit. Wayne [name] answered door. Ambulates with rollator slowly. Home clean and free of clutter. Wayne [name] well groomed. Client's View of His/Her Situation: Client View Of His/Her Situation: Wayne [name] reports pain 3/10. Denies any recent falls. However, reports almost following twice. Overall reports he continues to suffer from chronic pain.  Medication Assessment: reviewed and updated    OT Update: completed  Session Summary: Wayne [name] reports he is doing okay. Reports he still has chronic pain that impedes his activities. Reports he enjoys watching sports.Endorses looking forward to watching the Super Bowl this upcoming weekend.  Reports he enjoys talking over the phone with his daughter in [place]  daily and his son about sports.    Goals Addressed               This Visit's Progress     AG RN (pt-stated)        Goal: Decrease in pain and in pain frequency over the next 160 days   10/10/23   Assessment: Client reports having pain all of the time. Reports rest and Oxycodone  helps with his pain. Reports he uses medicated gel daily that helps with pain relief as well. Reports pain is worse with movement.   Intervention: Discussed pain relief strategies. Provided Wayne [name] calendar book along with writer's contact information. Also provided [name] writer's contact information.  Plan: Scheduled next home visit with AG RN for 11/14/23 at 12 noon.   CLIENT/RN ACTION PLAN - PAIN  Registered Nurse:  [name] Date: 10/10/2023  Client Name: Wayne [name] Client ID:    Target Area:  PAIN   Why Problem May Occur: Because of arthritis   Target Goal: Decrease in pain level and frequency over the next 160 days  STRATEGIES Coping Strategies Ideas:  Heat  Reviewed 10/10/23  Reviewed 11/22/23 Reviewed 01/07/24 Use heating pad or warm towel on painful area no more than 20 minutes at a time. Don't sleep with a heating pad on - it could burn your skin   Ice  N/a  Reviewed 11/22/23 Use ice pack or frozen bag of vegetables on painful area.   Leave cold pack on for less than 20 minutes. Ice can burn your skin.  Don't leave ice on longer than 20 minutes.   Activity and Exercise   Reviewed 10/10/23  Reviewed home exercises and exercise booklet provided 11/22/23  Reviewed 01/07/24 Joints get stiff when not in use Aging Gracefully Exercises Walking (inside or outside) Dancing -- N/A Gardening- N/A Housework:  cooking, cleaning, laundry- N/A Swimming- N/A   Listen to Music  N/A Listening to music can decrease pain. Turn off the TV and turn on the radio.   Prayer/Meditation N/A Prayer and meditation can decrease  pain   Other   Acetaminophen /Tylenol  (same medication)  Reviewed 10/10/23  Reviewed 11/22/23 Reviewed 01/07/24 DO NOT take more Acetaminophen  than below because it can be bad for your liver. 500 mg. tablets:  2 tablets every 8 hours, as needed for pain.  Do not take more than 6, (500 mg) tablets every day. 300 mg. tablets:  2 tablets every 6 hours, as needed for pain.  Do not take more than 8 (325 mg) tablets every day. Look for Acetaminophen /Tylenol  in other medicines you buy over the counter.   Still in Pain  Reviewed 10/10/23  Reviewed 11/22/23 Reviewed 01/07/24 There ae a lot of different kinds of pain medicines:  creams, patches and supplements. Ask your Healthcare Provider about other pain medications.   Stop Smoking- N/A Smoking can make arthritis worse.   Stop Pain  Reviewed 10/10/23 Reviewed 11/22/23 Before it gets bad. Once in pain It is harder to get rid of. Begin pain relief while you have mild pain.   Other   Other    ;  PRACTICE It is important to practice the strategies so we can determine if they will be effective in helping to reach the goal.    Follow these specific recommendations:        If strategy does not work the first time, try it again.  We may make some changes over the next few sessions.    We may make some changes over the next few sessions, based on how they work.    [name], MSN, RN, BSN RN Case Manager for Aging Gracefully [place]  [facility] Direct Dial: [phone]        11/22/2023  Assessment: Mr. Stecher reports pain 8 out of 10. Endorses he has a high pain tolerance. States ankle brace, creams, and pain medication sometimes help with pain. However, his pain usually does not go away. Reports chronic pain is due to arthritis. Denies any recent falls. Continues to wear medical alert and uses rollator.   Interventions: Performed home exercises and provided exercise booklet. Mr. Scholer did some return  demonstration of the seated exercises. Discussed fall precautions. Encouraged Mr. Mindel to contact PCP about possible pain medication adjustments due to unrelieved pain.   Plan: Scheduled next home visit for January 21st at 12 noon.   [name], MSN, RN, BSN RN Case Manager for Aging Gracefully [facility]  [facility] Direct Dial: [phone]    01/07/24  Assessment: Mr. Loving reports pain 3/10. Reports his pain is chronic. However, reports creams have helped some. States he does ankle foot pump exercises. States he does not think the exercises help. Reports loss of balance but denies fall. Confirms he has medical alert system. Endorsing using his rollator consistently. Denies dizziness or pre-syncopal episode. Reports he does not like asking for help from others unless he absolutely needs it.   Interventions: Encouraged Mr. Breshears to solicit assistancec from family/friends more often. Encouraged Mr. Armato to consider alternative transportation, such as family/friends when going to appointments. Encouraged Mr. Havey to perform exercise booklet exercises more often to assist with pain control. Discussed referral for [facility]  complex care management team after last AG RN home visit.   Plan: Scheduled next home visit for 01/17/2024 at 12 noon.   [name], MSN, RN, BSN [place]  [facility] RN Case Manager for Aging Gracefully Direct Dial: [phone]                                                                                    [name], MSN, RN, BSN [place]  [facility] RN Case Manager for Aging Gracefully Direct Dial: [phone]

## 2024-01-07 NOTE — Patient Instructions (Signed)
Visit Information  Thank you for taking time to visit with me today. Please don't hesitate to contact me if I can be of assistance to you before our next scheduled home appointment.  Following are the goals we discussed today:  (Copy and paste patient goals from clinical care plan here)  Our next appointment is on February 14th at 12 noon.   If you are experiencing a Mental Health or Behavioral Health Crisis or need someone to talk to, please call the Suicide and Crisis Lifeline: 988 call the [place] National Suicide Prevention Lifeline: 682-025-9560 or TTY: 775-735-7937 TTY 662-394-7423) to talk to a trained counselor call 1-800-273-TALK (toll free, 24 hour hotline) go to [facility] [address] [place] [phone]) call 911   The patient verbalized understanding of instructions, educational materials, and care plan provided today and agreed to receive a mailed copy of patient instructions, educational materials, and care plan.    Goals Addressed               This Visit's Progress     Wayne [name] (pt-stated)        Goal: Decrease in pain and in pain frequency over the next 160 days   10/10/23   Assessment: Client reports having pain all of the time. Reports rest and Oxycodone helps with his pain. Reports he uses medicated gel daily that helps with pain relief as well. Reports pain is worse with movement.   Intervention: Discussed pain relief strategies. Provided [facility] calendar book along with writer's contact information. Also provided Wayne [name] writer's contact information.  Plan: Scheduled next home visit with Wayne [name] for 11/14/23 at 12 noon.   CLIENT/[name] ACTION PLAN - PAIN  Registered Nurse:  Wayne [name] Date: 10/10/2023  Client Name: Wayne [name] Client ID:    Target Area:  PAIN   Why Problem May Occur: Because of arthritis   Target Goal: Decrease in pain level and frequency over the next 160 days  STRATEGIES Coping Strategies Ideas:   Heat  Reviewed 10/10/23  Reviewed 11/22/23 Reviewed 01/07/24 Use heating pad or warm towel on painful area no more than 20 minutes at a time. Don't sleep with a heating pad on - it could burn your skin   Ice  N/a  Reviewed 11/22/23 Use ice pack or frozen bag of vegetables on painful area.   Leave cold pack on for less than 20 minutes. Ice can burn your skin.  Don't leave ice on longer than 20 minutes.   Activity and Exercise   Reviewed 10/10/23  Reviewed home exercises and exercise booklet provided 11/22/23  Reviewed 01/07/24 Joints get stiff when not in use Aging Gracefully Exercises Walking (inside or outside) Dancing -- N/A Gardening- N/A Housework:  cooking, cleaning, laundry- N/A Swimming- N/A   Listen to Music  N/A Listening to music can decrease pain. Turn off the TV and turn on the radio.   Prayer/Meditation N/A Prayer and meditation can decrease pain   Other   Acetaminophen/Tylenol (same medication)  Reviewed 10/10/23  Reviewed 11/22/23 Reviewed 01/07/24 DO NOT take more Acetaminophen than below because it can be bad for your liver. 500 mg. tablets:  2 tablets every 8 hours, as needed for pain.  Do not take more than 6, (500 mg) tablets every day. 300 mg. tablets:  2 tablets every 6 hours, as needed for pain.  Do not take more than 8 (325 mg) tablets every day. Look for Acetaminophen/Tylenol in other medicines you buy over  the counter.   Still in Pain  Reviewed 10/10/23  Reviewed 11/22/23 Reviewed 01/07/24 There ae a lot of different kinds of pain medicines:  creams, patches and supplements. Ask your Healthcare Provider about other pain medications.   Stop Smoking- N/A Smoking can make arthritis worse.   Stop Pain  Reviewed 10/10/23 Reviewed 11/22/23 Before it gets bad. Once in pain It is harder to get rid of. Begin pain relief while you have mild pain.   Other   Other    ;  PRACTICE It is important to practice the strategies so we can determine  if they will be effective in helping to reach the goal.    Follow these specific recommendations:        If strategy does not work the first time, try it again.  We may make some changes over the next few sessions.    We may make some changes over the next few sessions, based on how they work.    Wayne Noble, MSN, [name], BSN [name] Case Manager for Aging Gracefully   [facility] Direct Dial: [phone]        11/22/2023  Assessment: Wayne [name] reports pain 8 out of 10. Endorses he has a high pain tolerance. States ankle brace, creams, and pain medication sometimes help with pain. However, his pain usually does not go away. Reports chronic pain is due to arthritis. Denies any recent falls. Continues to wear medical alert and uses rollator.   Interventions: Performed home exercises and provided exercise booklet. Wayne [name] did some return demonstration of the seated exercises. Discussed fall precautions. Encouraged Wayne [name] to contact PCP about possible pain medication adjustments due to unrelieved pain.   Plan: Scheduled next home visit for January 21st at 12 noon.   Wayne Noble, MSN, [name], BSN [name] Case Manager for Aging Gracefully [facility]  [facility] Direct Dial: [phone]    01/07/24  Assessment: Wayne [name] reports pain 3/10. Reports his pain is chronic. However, reports creams have helped some. States he does ankle foot pump exercises. States he does not think the exercises help. Reports loss of balance but denies fall. Confirms he has medical alert system. Endorsing using his rollator consistently. Denies dizziness or pre-syncopal episode. Reports he does not like asking for help from others unless he absolutely needs it.   Interventions: Encouraged Wayne [name] to solicit assistancec from family/friends more often. Encouraged Wayne [name] to consider alternative transportation, such as family/friends when going to appointments. Encouraged Mr.  [name] to perform exercise booklet exercises more often to assist with pain control. Discussed referral for [facility] complex care management team after last Wayne [name] home visit.   Plan: Scheduled next home visit for 01/17/2024 at 12 noon.   Wayne Noble, MSN, [name], BSN   [facility] [name] Case Manager for Aging Gracefully Direct Dial: [phone]

## 2024-01-16 DIAGNOSIS — N261 Atrophy of kidney (terminal): Secondary | ICD-10-CM | POA: Diagnosis not present

## 2024-01-16 DIAGNOSIS — N2581 Secondary hyperparathyroidism of renal origin: Secondary | ICD-10-CM | POA: Diagnosis not present

## 2024-01-16 DIAGNOSIS — N1832 Chronic kidney disease, stage 3b: Secondary | ICD-10-CM | POA: Diagnosis not present

## 2024-01-16 DIAGNOSIS — I129 Hypertensive chronic kidney disease with stage 1 through stage 4 chronic kidney disease, or unspecified chronic kidney disease: Secondary | ICD-10-CM | POA: Diagnosis not present

## 2024-01-16 DIAGNOSIS — D631 Anemia in chronic kidney disease: Secondary | ICD-10-CM | POA: Diagnosis not present

## 2024-01-16 DIAGNOSIS — R609 Edema, unspecified: Secondary | ICD-10-CM | POA: Diagnosis not present

## 2024-01-16 DIAGNOSIS — N189 Chronic kidney disease, unspecified: Secondary | ICD-10-CM | POA: Diagnosis not present

## 2024-01-16 DIAGNOSIS — N2889 Other specified disorders of kidney and ureter: Secondary | ICD-10-CM | POA: Diagnosis not present

## 2024-01-17 ENCOUNTER — Encounter: Payer: Self-pay | Admitting: *Deleted

## 2024-01-17 ENCOUNTER — Other Ambulatory Visit: Payer: Self-pay | Admitting: *Deleted

## 2024-01-17 LAB — LAB REPORT - SCANNED: PTH: 32

## 2024-01-17 NOTE — Patient Outreach (Signed)
Aging Gracefully Program  RN Visit  01/17/2024  Wayne [name] Apr 09, 1926 [id number]  Visit:  RN Visit Number: 4- Fourth Visit  RN TIME CALCULATION: Start TIme:  RN Start Time Calculation: 1200 End Time:  RN Stop Time Calculation: 1250 Total Minutes:  RN Time Calculation: 50  Readiness To Change Score:  Readiness to Change Score: 8.33  Universal RN Interventions: Calendar Distribution: No Exercise Review: Yes Medications: Yes Medication Changes: Yes Mood: Yes Pain: Yes PCP Advocacy/Support: Yes Fall Prevention: Yes Incontinence: No Clinician View Of Client Situation: Arrived to Wayne [name] home. Wayne [name] ambulating independently with rollator. Home clean, neat, and free of clutter. [employer] has completed home modifications. Client View Of His/Her Situation: Wayne [name] reports he is "always in pain." Reports pain fluctates with the weather and temperature. Denies having any falls. Denies having any  Healthcare Provider Communication: Did Surveyor, mining With [employer] Provider?: No Healthcare Provider Response According to RN: N/A According to Client, Did PCP Report Communication With An Aging Gracefully RN?: No Healthcare Provider Response According To Client: N/A  Clinician View of Client Situation: Clinician View Of Client Situation: Arrived to Wayne [name] home. Wayne [name] ambulating independently with rollator. Home clean, neat, and free of clutter. [employer] has completed home modifications. Client's View of His/Her Situation: Client View Of His/Her Situation: Wayne [name] reports he is "always in pain." Reports pain fluctates with the weather and temperature. Denies having any falls. Denies having any  Medication Assessment: Reviewed. No changes.    OT Update: Completed after [employer]' completion.  Session Summary: Wayne [name] is doing well overall. No new concerns or complaints. Enjoys speaking with his daughter  daily. Ambulates around the home with his rollator. Denies any falls. Continues to wear his emergency alert system. Wayne [name] reports being pleased with the program. Denies the need for care management referral.    Goals Addressed               This Visit's Progress     COMPLETED: AG RN (pt-stated)        Goal: Decrease in pain and in pain frequency over the next 160 days   10/10/23   Assessment: Client reports having pain all of the time. Reports rest and Oxycodone helps with his pain. Reports he uses medicated gel daily that helps with pain relief as well. Reports pain is worse with movement.   Intervention: Discussed pain relief strategies. Provided [facility] calendar book along with writer's contact information. Also provided Wayne [name] December writer's contact information.  Plan: Scheduled next home visit with AG RN for 11/14/23 at 12 noon.   CLIENT/RN ACTION PLAN - PAIN  Registered Nurse:  [name] Date: 10/10/2023  Client Name: Wayne [name] Client ID:    Target Area:  PAIN   Why Problem May Occur: Because of arthritis   Target Goal: Decrease in pain level and frequency over the next 160 days  STRATEGIES Coping Strategies Ideas:  Heat  Reviewed 10/10/23  Reviewed 11/22/23 Reviewed 01/07/24 Reviewed 01/17/24 Use heating pad or warm towel on painful area no more than 20 minutes at a time. Don't sleep with a heating pad on - it could burn your skin   Ice  N/a  Reviewed 11/22/23  Use ice pack or frozen bag of vegetables on painful area.   Leave cold pack on for less than 20 minutes. Ice can burn your skin.  Don't leave ice on longer than 20 minutes.   Activity and  Exercise   Reviewed 10/10/23  Reviewed home exercises and exercise booklet provided 11/22/23  Reviewed 01/07/24 Reviewed 01/17/24 Joints get stiff when not in use Aging Gracefully Exercises Walking (inside or outside) Dancing -- N/A Gardening- N/A Housework:  cooking, cleaning, laundry- N/A Swimming- N/A    Listen to Music  N/A Listening to music can decrease pain. Turn off the TV and turn on the radio.   Prayer/Meditation N/A Prayer and meditation can decrease pain   Other   Acetaminophen/Tylenol (same medication)  Reviewed 10/10/23  Reviewed 11/22/23 Reviewed 01/07/24 Reviewed 01/17/24 DO NOT take more Acetaminophen than below because it can be bad for your liver. 500 mg. tablets:  2 tablets every 8 hours, as needed for pain.  Do not take more than 6, (500 mg) tablets every day. 300 mg. tablets:  2 tablets every 6 hours, as needed for pain.  Do not take more than 8 (325 mg) tablets every day. Look for Acetaminophen/Tylenol in other medicines you buy over the counter.   Still in Pain  Reviewed 10/10/23  Reviewed 11/22/23 Reviewed 01/07/24 Reviewed 01/17/24 There ae a lot of different kinds of pain medicines:  creams, patches and supplements. Ask your Healthcare Provider about other pain medications.   Stop Smoking- N/A Smoking can make arthritis worse.   Stop Pain  Reviewed 10/10/23 Reviewed 11/22/23 Reviewed 01/17/24 Before it gets bad. Once in pain It is harder to get rid of. Begin pain relief while you have mild pain.   Other   Other    ;  PRACTICE It is important to practice the strategies so we can determine if they will be effective in helping to reach the goal.    Follow these specific recommendations:        If strategy does not work the first time, try it again.  We may make some changes over the next few sessions.    We may make some changes over the next few sessions, based on how they work.    [name], MSN, RN, BSN RN Case Manager for Aging Gracefully [place]  [facility] Direct Dial: [phone]        11/22/2023  Assessment: Wayne [name] reports pain 8 out of 10. Endorses he has a high pain tolerance. States ankle brace, creams, and pain medication sometimes help with pain. However, his pain usually does not go away. Reports  chronic pain is due to arthritis. Denies any recent falls. Continues to wear medical alert and uses rollator.   Interventions: Performed home exercises and provided exercise booklet. Wayne [name] did some return demonstration of the seated exercises. Discussed fall precautions. Encouraged Mr. Lefeber to contact PCP about possible pain medication adjustments due to unrelieved pain.   Plan: Scheduled next home visit for January 21st at 12 noon.   [name], MSN, RN, BSN RN Case Manager for Aging Gracefully [facility]  [facility] Direct Dial: [phone]    01/07/24  Assessment: Mr. Fearnow reports pain 3/10. Reports his pain is chronic. However, reports creams have helped some. States he does ankle foot pump exercises. States he does not think the exercises help. Reports loss of balance but denies fall. Confirms he has medical alert system. Endorsing using his rollator consistently. Denies dizziness or pre-syncopal episode. Reports he does not like asking for help from others unless he absolutely needs it.   Interventions: Encouraged Mr. Peifer to solicit assistancec from family/friends more often. Encouraged Mr. Gaudin to consider alternative transportation, such as family/friends  when going to appointments. Encouraged Mr. Sanger to perform exercise booklet exercises more often to assist with pain control. Discussed referral for [facility] complex care management team after last AG RN home visit.   Plan: Scheduled next home visit for 01/17/2024 at 12 noon.   [name], MSN, RN, BSN [place]  [facility] RN Case Manager for Aging Gracefully Direct Dial: [phone]     01/17/24  Assessment: Mr. Jeanbaptiste reports pain 6/10. States his pain is chronic and not much helps. Reports his pain has fluctuated with the change in weather temperatures. Endorses practicing home exercises. Reports he keeps his heat on 76 degrees. Endorses he has suffered with arthritis for many years.    Intervention: Encouraged Mr. Hochstatter to continue with home exercises as able to prevent stiffness.  Encouraged Mr. Candee to consider layering clothes and/or blankets for additional warmth during the cold weather season. Encouraged Mr. Kincy to use OTC topical creams for pain. Offered referral for [facility]/ [facility] care management services. Mr. Scallon declined referral at this time. Made Mr. Herder aware that this is writer's last visit.   Plan: Will notify AG team of completion of RN visits.   [name], MSN, RN, BSN [place]  [facility] RN Case Manager for Aging Gracefully Direct Dial: [phone]                                                                                                           [name], MSN, RN, BSN [place]  [facility] RN Case Manager for Aging Gracefully Direct Dial: [phone]

## 2024-01-17 NOTE — Patient Instructions (Signed)
Visit Information  Thank you for taking time to visit with me today. Please don't hesitate to contact me if I can be of assistance to you before our next scheduled home appointment.  Following are the goals we discussed today:    If you are experiencing a Mental Health or Behavioral Health Crisis or need someone to talk to, please call the Suicide and Crisis Lifeline: 988 call the [place] National Suicide Prevention Lifeline: 712-728-6530 or TTY: 417-635-1901 TTY 262-627-2326) to talk to a trained counselor call 1-800-273-TALK (toll free, 24 hour hotline) go to [facility] [address] [place] [phone]) call 911   The patient verbalized understanding of instructions, educational materials, and care plan provided today and agreed to receive a mailed copy of patient instructions, educational materials, and care plan.    Goals Addressed               This Visit's Progress     COMPLETED: AG RN (pt-stated)        Goal: Decrease in pain and in pain frequency over the next 160 days   10/10/23   Assessment: Client reports having pain all of the time. Reports rest and Oxycodone helps with his pain. Reports he uses medicated gel daily that helps with pain relief as well. Reports pain is worse with movement.   Intervention: Discussed pain relief strategies. Provided [facility] calendar book along with writer's contact information. Also provided Jasmine December writer's contact information.  Plan: Scheduled next home visit with AG RN for 11/14/23 at 12 noon.   CLIENT/RN ACTION PLAN - PAIN  Registered Nurse:  [name] Date: 10/10/2023  Client Name: Wayne [name] Client ID:    Target Area:  PAIN   Why Problem May Occur: Because of arthritis   Target Goal: Decrease in pain level and frequency over the next 160 days  STRATEGIES Coping Strategies Ideas:  Heat  Reviewed 10/10/23  Reviewed 11/22/23 Reviewed 01/07/24 Reviewed 01/17/24 Use heating pad or  warm towel on painful area no more than 20 minutes at a time. Don't sleep with a heating pad on - it could burn your skin   Ice  N/a  Reviewed 11/22/23  Use ice pack or frozen bag of vegetables on painful area.   Leave cold pack on for less than 20 minutes. Ice can burn your skin.  Don't leave ice on longer than 20 minutes.   Activity and Exercise   Reviewed 10/10/23  Reviewed home exercises and exercise booklet provided 11/22/23  Reviewed 01/07/24 Reviewed 01/17/24 Joints get stiff when not in use Aging Gracefully Exercises Walking (inside or outside) Dancing -- N/A Gardening- N/A Housework:  cooking, cleaning, laundry- N/A Swimming- N/A   Listen to Music  N/A Listening to music can decrease pain. Turn off the TV and turn on the radio.   Prayer/Meditation N/A Prayer and meditation can decrease pain   Other   Acetaminophen/Tylenol (same medication)  Reviewed 10/10/23  Reviewed 11/22/23 Reviewed 01/07/24 Reviewed 01/17/24 DO NOT take more Acetaminophen than below because it can be bad for your liver. 500 mg. tablets:  2 tablets every 8 hours, as needed for pain.  Do not take more than 6, (500 mg) tablets every day. 300 mg. tablets:  2 tablets every 6 hours, as needed for pain.  Do not take more than 8 (325 mg) tablets every day. Look for Acetaminophen/Tylenol in other medicines you buy over the counter.   Still in Pain  Reviewed 10/10/23  Reviewed 11/22/23  Reviewed 01/07/24 Reviewed 01/17/24 There ae a lot of different kinds of pain medicines:  creams, patches and supplements. Ask your Healthcare Provider about other pain medications.   Stop Smoking- N/A Smoking can make arthritis worse.   Stop Pain  Reviewed 10/10/23 Reviewed 11/22/23 Reviewed 01/17/24 Before it gets bad. Once in pain It is harder to get rid of. Begin pain relief while you have mild pain.   Other   Other    ;  PRACTICE It is important to practice the strategies so we can determine if they  will be effective in helping to reach the goal.    Follow these specific recommendations:        If strategy does not work the first time, try it again.  We may make some changes over the next few sessions.    We may make some changes over the next few sessions, based on how they work.    [name], MSN, RN, BSN RN Case Manager for Aging Gracefully [place]  [facility] Direct Dial: [phone]        11/22/2023  Assessment: Wayne [name] reports pain 8 out of 10. Endorses he has a high pain tolerance. States ankle brace, creams, and pain medication sometimes help with pain. However, his pain usually does not go away. Reports chronic pain is due to arthritis. Denies any recent falls. Continues to wear medical alert and uses rollator.   Interventions: Performed home exercises and provided exercise booklet. Wayne [name] did some return demonstration of the seated exercises. Discussed fall precautions. Encouraged Wayne [name] to contact PCP about possible pain medication adjustments due to unrelieved pain.   Plan: Scheduled next home visit for January 21st at 12 noon.   [name], MSN, RN, BSN RN Case Manager for Aging Gracefully [facility]  [facility] Direct Dial: [phone]    01/07/24  Assessment: Wayne [name] reports pain 3/10. Reports his pain is chronic. However, reports creams have helped some. States he does ankle foot pump exercises. States he does not think the exercises help. Reports loss of balance but denies fall. Confirms he has medical alert system. Endorsing using his rollator consistently. Denies dizziness or pre-syncopal episode. Reports he does not like asking for help from others unless he absolutely needs it.   Interventions: Encouraged Wayne [name] to solicit assistancec from family/friends more often. Encouraged Wayne [name] to consider alternative transportation, such as family/friends when going to appointments. Encouraged Wayne [name] to  perform exercise booklet exercises more often to assist with pain control. Discussed referral for [facility] complex care management team after last AG RN home visit.   Plan: Scheduled next home visit for 01/17/2024 at 12 noon.   [name], MSN, RN, BSN [place]  [facility] RN Case Manager for Aging Gracefully Direct Dial: [phone]     01/17/24  Assessment: Mr. Mogg reports pain 6/10. States his pain is chronic and not much helps. Reports his pain has fluctuated with the change in weather temperatures. Endorses practicing home exercises. Reports he keeps his heat on 76 degrees. Endorses he has suffered with arthritis for many years.   Intervention: Encouraged Mr. Chachere to continue with home exercises as able to prevent stiffness.  Encouraged Mr. Hensch to consider layering clothes and/or blankets for additional warmth during the cold weather season. Encouraged Mr. Lanzo to use OTC topical creams for pain. Offered referral for [facility]/ [facility] care management services. Mr. Batty declined referral at this time. Made Mr. Vanover aware  that this is writer's last visit.   Plan: Will notify AG team of completion of RN visits.   [name], MSN, RN, BSN [place]  [facility] RN Case Manager for Aging Gracefully Direct Dial: [phone]                                                                                                            [name], MSN, RN, BSN [place]  [facility] RN Case Manager for Aging Gracefully Direct Dial: [phone]

## 2024-01-29 ENCOUNTER — Other Ambulatory Visit: Payer: Self-pay | Admitting: Internal Medicine

## 2024-02-05 ENCOUNTER — Encounter: Payer: Self-pay | Admitting: Internal Medicine

## 2024-02-05 ENCOUNTER — Ambulatory Visit: Payer: Medicare Other

## 2024-02-05 ENCOUNTER — Ambulatory Visit: Payer: Medicare Other | Admitting: Internal Medicine

## 2024-02-05 VITALS — BP 124/80 | HR 56 | Temp 98.7°F | Ht 63.0 in | Wt 153.0 lb

## 2024-02-05 DIAGNOSIS — G5621 Lesion of ulnar nerve, right upper limb: Secondary | ICD-10-CM

## 2024-02-05 DIAGNOSIS — D631 Anemia in chronic kidney disease: Secondary | ICD-10-CM | POA: Diagnosis not present

## 2024-02-05 DIAGNOSIS — I1310 Hypertensive heart and chronic kidney disease without heart failure, with stage 1 through stage 4 chronic kidney disease, or unspecified chronic kidney disease: Secondary | ICD-10-CM

## 2024-02-05 DIAGNOSIS — I701 Atherosclerosis of renal artery: Secondary | ICD-10-CM | POA: Diagnosis not present

## 2024-02-05 DIAGNOSIS — Z0000 Encounter for general adult medical examination without abnormal findings: Secondary | ICD-10-CM

## 2024-02-05 DIAGNOSIS — N1832 Chronic kidney disease, stage 3b: Secondary | ICD-10-CM | POA: Diagnosis not present

## 2024-02-05 DIAGNOSIS — E7800 Pure hypercholesterolemia, unspecified: Secondary | ICD-10-CM | POA: Diagnosis not present

## 2024-02-05 DIAGNOSIS — R718 Other abnormality of red blood cells: Secondary | ICD-10-CM | POA: Diagnosis not present

## 2024-02-05 DIAGNOSIS — R630 Anorexia: Secondary | ICD-10-CM

## 2024-02-05 NOTE — Patient Instructions (Signed)
 Hypertension, Adult Hypertension is another name for high blood pressure. High blood pressure forces your heart to work harder to pump blood. This can cause problems over time. There are two numbers in a blood pressure reading. There is a top number (systolic) over a bottom number (diastolic). It is best to have a blood pressure that is below 120/80. What are the causes? The cause of this condition is not known. Some other conditions can lead to high blood pressure. What increases the risk? Some lifestyle factors can make you more likely to develop high blood pressure: Smoking. Not getting enough exercise or physical activity. Being overweight. Having too much fat, sugar, calories, or salt (sodium) in your diet. Drinking too much alcohol. Other risk factors include: Having any of these conditions: Heart disease. Diabetes. High cholesterol. Kidney disease. Obstructive sleep apnea. Having a family history of high blood pressure and high cholesterol. Age. The risk increases with age. Stress. What are the signs or symptoms? High blood pressure may not cause symptoms. Very high blood pressure (hypertensive crisis) may cause: Headache. Fast or uneven heartbeats (palpitations). Shortness of breath. Nosebleed. Vomiting or feeling like you may vomit (nauseous). Changes in how you see. Very bad chest pain. Feeling dizzy. Seizures. How is this treated? This condition is treated by making healthy lifestyle changes, such as: Eating healthy foods. Exercising more. Drinking less alcohol. Your doctor may prescribe medicine if lifestyle changes do not help enough and if: Your top number is above 130. Your bottom number is above 80. Your personal target blood pressure may vary. Follow these instructions at home: Eating and drinking  If told, follow the DASH eating plan. To follow this plan: Fill one half of your plate at each meal with fruits and vegetables. Fill one fourth of your plate  at each meal with whole grains. Whole grains include whole-wheat pasta, brown rice, and whole-grain bread. Eat or drink low-fat dairy products, such as skim milk or low-fat yogurt. Fill one fourth of your plate at each meal with low-fat (lean) proteins. Low-fat proteins include fish, chicken without skin, eggs, beans, and tofu. Avoid fatty meat, cured and processed meat, or chicken with skin. Avoid pre-made or processed food. Limit the amount of salt in your diet to less than 1,500 mg each day. Do not drink alcohol if: Your doctor tells you not to drink. You are pregnant, may be pregnant, or are planning to become pregnant. If you drink alcohol: Limit how much you have to: 0-1 drink a day for women. 0-2 drinks a day for men. Know how much alcohol is in your drink. In the [place] one drink equals one 12 oz bottle of beer (355 mL), one 5 oz glass of wine (148 mL), or one 1 oz glass of hard liquor (44 mL). Lifestyle  Work with your doctor to stay at a healthy weight or to lose weight. Ask your doctor what the best weight is for you. Get at least 30 minutes of exercise that causes your heart to beat faster (aerobic exercise) most days of the week. This may include walking, swimming, or biking. Get at least 30 minutes of exercise that strengthens your muscles (resistance exercise) at least 3 days a week. This may include lifting weights or doing Pilates. Do not smoke or use any products that contain nicotine or tobacco. If you need help quitting, ask your doctor. Check your blood pressure at home as told by your doctor. Keep all follow-up visits. Medicines Take over-the-counter and prescription medicines  only as told by your doctor. Follow directions carefully. Do not skip doses of blood pressure medicine. The medicine does not work as well if you skip doses. Skipping doses also puts you at risk for problems. Ask your doctor about side effects or reactions to medicines that you should watch  for. Contact a doctor if: You think you are having a reaction to the medicine you are taking. You have headaches that keep coming back. You feel dizzy. You have swelling in your ankles. You have trouble with your vision. Get help right away if: You get a very bad headache. You start to feel mixed up (confused). You feel weak or numb. You feel faint. You have very bad pain in your: Chest. Belly (abdomen). You vomit more than once. You have trouble breathing. These symptoms may be an emergency. Get help right away. Call 911. Do not wait to see if the symptoms will go away. Do not drive yourself to the hospital. Summary Hypertension is another name for high blood pressure. High blood pressure forces your heart to work harder to pump blood. For most people, a normal blood pressure is less than 120/80. Making healthy choices can help lower blood pressure. If your blood pressure does not get lower with healthy choices, you may need to take medicine. This information is not intended to replace advice given to you by your health care provider. Make sure you discuss any questions you have with your health care provider. Document Revised: 09/07/2021 Document Reviewed: 09/07/2021 Elsevier Patient Education  2024 [employer].

## 2024-02-05 NOTE — Progress Notes (Signed)
 I,[name], CMA,acting as a Neurosurgeon for Wayne Aliment, MD.,have documented all relevant documentation on the behalf of Wayne Aliment, MD,as directed by  Wayne Aliment, MD while in the presence of Wayne Aliment, MD.  Subjective:  Patient ID: Wayne [name] , male    DOB: [birth year] , 89 y.o.   MRN: [id number]  Chief Complaint  Patient presents with   Hypertension    HPI  Patient presents today for a bp check.  He reports compliance with meds. Denies headaches, chest pain and shortness of breath.   He complains of lack of energy. He does admit to decreased appetite. Denies change in sleep habits.   He will also be seen by [facility] Advisor today for AWV.   Hypertension This is a chronic problem. The current episode started more than 1 year ago. The problem has been gradually improving since onset. The problem is controlled. Past treatments include calcium channel blockers, angiotensin blockers, diuretics and lifestyle changes. The current treatment provides moderate improvement. Hypertensive end-organ damage includes kidney disease.  daily   Past Medical History:  Diagnosis Date   Arthritis    Bradycardia    Cataract    CKD (chronic kidney disease)    Glaucoma    History of renal artery stenosis 03/18/2012   right renal PTA/stenting   Hyperlipidemia    Hypertension    Renal cyst, right      Family History  Problem Relation Age of Onset   Dementia Mother    Cancer Father      Current Outpatient Medications:    amLODipine (NORVASC) 5 MG tablet, Take 1 tablet (5 mg total) by mouth daily., Disp: 90 tablet, Rfl: 2   aspirin EC 81 MG tablet, Take 81 mg by mouth daily., Disp: , Rfl:    atorvastatin (LIPITOR) 20 MG tablet, TAKE M,W,F ONLY., Disp: 90 tablet, Rfl: 2   Emu Oil OIL, by Does not apply route., Disp: , Rfl:    furosemide (LASIX) 40 MG tablet, Take 40 mg by mouth 2 (two) times daily. Take 2 tabs in the morning; and 1 tab in the evening, Disp: , Rfl:     gabapentin (NEURONTIN) 300 MG capsule, TAKE 1 CAPSULE BY MOUTH TWICE  DAILY, Disp: 200 capsule, Rfl: 2   oxyCODONE-acetaminophen (PERCOCET) 7.5-325 MG tablet, Take 1-2 tablets by mouth daily as needed for severe pain (pain score 7-10)., Disp: , Rfl:    senna (SENOKOT) 8.6 MG tablet, Take 1 tablet by mouth daily., Disp: , Rfl:    tamsulosin (FLOMAX) 0.4 MG CAPS capsule, Take 0.4 mg by mouth in the morning and at bedtime., Disp: , Rfl:    timolol (TIMOPTIC) 0.5 % ophthalmic solution, Place 1 drop into both eyes daily., Disp: , Rfl:    trolamine salicylate (BLUE-EMU MAXIMUM PAIN RELIEF) 10 % cream, Apply 1 Application topically daily at 12 noon., Disp: , Rfl:    valsartan (DIOVAN) 80 MG tablet, Take 80 mg by mouth daily., Disp: , Rfl:    furosemide (LASIX) 40 MG tablet, Take 40 mg by mouth daily. (Patient not taking: Reported on 10/10/2023), Disp: , Rfl:    lidocaine (LIDODERM) 5 %, Place 1 patch onto the skin daily. Remove & Discard patch within 12 hours or as directed by MD (Patient not taking: Reported on 09/19/2023), Disp: 30 patch, Rfl: 0   Multiple Vitamin (MULTIVITAMIN WITH MINERALS) TABS tablet, Take 1 tablet by mouth daily. 50+ (Patient not taking: Reported on 10/10/2023), Disp: ,  Rfl:    Omega-3 Fatty Acids (FISH OIL) 1200 MG CAPS, Take 1 capsule by mouth daily.  (Patient not taking: Reported on 10/10/2023), Disp: , Rfl:    OVER THE COUNTER MEDICATION, Take 1 tablet by mouth daily. Focus factor (Patient not taking: Reported on 02/05/2024), Disp: , Rfl:    OVER THE COUNTER MEDICATION, Apply 1 application  topically daily as needed (pain). Veterinary cream (Patient not taking: Reported on 10/10/2023), Disp: , Rfl:    Saw Palmetto 450 MG CAPS, Take 1 capsule by mouth daily. (Patient not taking: Reported on 10/10/2023), Disp: , Rfl:    tamsulosin (FLOMAX) 0.4 MG CAPS capsule, TAKE 1 CAPSULE BY MOUTH DAILY, Disp: 100 capsule, Rfl: 2   traMADol (ULTRAM) 50 MG tablet, Take 1 tablet (50 mg total) by mouth  every 6 (six) hours as needed for moderate pain (pain score 4-6) or severe pain (pain score 7-10). (Patient not taking: Reported on 10/10/2023), Disp: 20 tablet, Rfl: 0   Allergies  Allergen Reactions   Cephalexin     Severe mouth and throat dryness.     Review of Systems  Constitutional:  Positive for fatigue.  HENT: Negative.    Respiratory: Negative.    Cardiovascular: Negative.   Gastrointestinal: Negative.   Genitourinary: Negative.   Skin: Negative.   Allergic/Immunologic: Negative.   Neurological: Negative.   Hematological: Negative.      Today's Vitals   02/05/24 1407  BP: 124/80  Pulse: (!) 56  Temp: 98.7 F (37.1 C)  SpO2: 98%  Weight: 153 lb (69.4 kg)  Height: 5\' 3"  (1.6 m)   Body mass index is 27.1 kg/m.  Wt Readings from Last 3 Encounters:  02/05/24 153 lb (69.4 kg)  02/05/24 153 lb (69.4 kg)  09/24/23 155 lb (70.3 kg)     Objective:  Physical Exam Vitals and nursing note reviewed.  Constitutional:      Appearance: Normal appearance.  HENT:     Head: Normocephalic and atraumatic.  Eyes:     Extraocular Movements: Extraocular movements intact.  Cardiovascular:     Rate and Rhythm: Normal rate and regular rhythm.     Heart sounds: Normal heart sounds.  Pulmonary:     Effort: Pulmonary effort is normal.     Breath sounds: Normal breath sounds.  Musculoskeletal:        General: Swelling present.     Cervical back: Normal range of motion.     Comments: Ambulatory with walker Joint swelling b/l hands  Skin:    General: Skin is warm.  Neurological:     General: No focal deficit present.     Mental Status: He is alert.  Psychiatric:        Mood and Affect: Mood normal.         Assessment And Plan:  Hypertensive heart and renal disease with renal failure, stage 1 through stage 4 or unspecified chronic kidney disease, without heart failure Assessment & Plan: Chronic, well controlled. He reports compliance with meds. He will continue with  amlodipine 5mg  daily, valsartan 80mg   daily and furosemide as per Renal.  He is encouraged to follow low sodium diet. No med changes today. He will rto in March 2025 for his next evaluation.    Atherosclerosis of renal artery (HCC) Assessment & Plan: Chronic, LDL goal is less than 70. He will continue with atorvastatin 20mg  MWF dosing.    Stage 3b chronic kidney disease (HCC) Assessment & Plan: Chronic, he is encouraged to stay well  hydrated, avoid NSAIDs and keep BP controlled to prevent progression of CKD.     Pure hypercholesterolemia Assessment & Plan: Chronic, LDL goal is less than 70.  He will continue with atorvastatin 20mg  w/ MWF dosing. He has been unable to tolerate daily dosing.    Decreased appetite Assessment & Plan: I will check labs as below. He does not wish to take rx meds to increase his appetite. He is encouraged to prioritize his protein intake.   Orders: -     CBC -     Prealbumin  Anemia of chronic renal failure, stage 3b (HCC) Assessment & Plan: Chronic, I will check labs as below. Renal input is appreciated.  I advised him that the anemia could contribute to his fatigue, as well as the CKD.   Orders: -     Iron, TIBC and Ferritin Panel  Other orders -     B12 and Folate Panel -     Specimen status report     Return for 6 month bp & chol f/u.Marland Kitchen  Patient was given opportunity to ask questions. Patient verbalized understanding of the plan and was able to repeat key elements of the plan. All questions were answered to their satisfaction.    I, Wayne Aliment, MD, have reviewed all documentation for this visit. The documentation on 02/05/24 for the exam, diagnosis, procedures, and orders are all accurate and complete.   IF YOU HAVE BEEN REFERRED TO A SPECIALIST, IT MAY TAKE 1-2 WEEKS TO SCHEDULE/PROCESS THE REFERRAL. IF YOU HAVE NOT HEARD FROM US/SPECIALIST IN TWO WEEKS, PLEASE GIVE [place] A CALL AT [phone] X [phone].   THE PATIENT IS ENCOURAGED TO  PRACTICE SOCIAL DISTANCING DUE TO THE COVID-19 PANDEMIC.

## 2024-02-05 NOTE — Patient Instructions (Signed)
 Wayne [name] , Thank you for taking time to come for your Medicare Wellness Visit. I appreciate your ongoing commitment to your health goals. Please review the following plan we discussed and let me know if I can assist you in the future.   Referrals/Orders/Follow-Ups/Clinician Recommendations: none  This is a list of the screening recommended for you and due dates:  Health Maintenance  Topic Date Due   COVID-19 Vaccine (6 - 2024-25 season) 11/07/2023   Medicare Annual Wellness Visit  02/04/2025   DTaP/Tdap/Td vaccine (2 - Td or Tdap) 09/27/2031   Pneumonia Vaccine  Completed   Flu Shot  Completed   Zoster (Shingles) Vaccine  Completed   HPV Vaccine  Aged Out    Advanced directives: (Copy Requested) Please bring a copy of your health care power of attorney and living will to the office to be added to your chart at your convenience.  Next Medicare Annual Wellness Visit scheduled for next year: Yes  insert Preventive Care attachment Insert FALL PREVENTION attachment if needed

## 2024-02-05 NOTE — Progress Notes (Signed)
 Subjective:   CARVELL HOEFFNER is a 89 y.o. who presents for a Medicare Wellness preventive visit.  Visit Complete: In person    AWV Questionnaire: No: Patient Medicare AWV questionnaire was not completed prior to this visit.  Cardiac Risk Factors include: advanced age (>46men, >13 women);dyslipidemia;hypertension;male gender     Objective:    Today's Vitals   02/05/24 1436  BP: 124/80  Pulse: (!) 56  Temp: 98.7 F (37.1 C)  TempSrc: Oral  SpO2: 98%  Weight: 153 lb (69.4 kg)  Height: 5\' 3"  (1.6 m)   Body mass index is 27.1 kg/m.     02/05/2024    2:43 PM 09/24/2023    8:04 AM 03/05/2023   12:29 PM 01/30/2023    2:13 PM 01/11/2022    2:20 PM 01/04/2021    2:04 PM 12/30/2019    2:23 PM  Advanced Directives  Does Patient Have a Medical Advance Directive? Yes Yes No Yes Yes Yes Yes  Type of Estate agent of [place];Living will Healthcare Power of [place];Living will  Healthcare Power of [place];Living will Healthcare Power of [place];Living will Healthcare Power of [place];Living will Living will  Copy of Healthcare Power of Attorney in Chart? No - copy requested   No - copy requested No - copy requested No - copy requested     Current Medications (verified) Outpatient Encounter Medications as of 02/05/2024  Medication Sig   amLODipine (NORVASC) 5 MG tablet Take 1 tablet (5 mg total) by mouth daily.   aspirin EC 81 MG tablet Take 81 mg by mouth daily.   atorvastatin (LIPITOR) 20 MG tablet TAKE M,W,F ONLY.   Emu Oil OIL by Does not apply route.   furosemide (LASIX) 40 MG tablet Take 40 mg by mouth 2 (two) times daily. Take 2 tabs in the morning; and 1 tab in the evening   gabapentin (NEURONTIN) 300 MG capsule TAKE 1 CAPSULE BY MOUTH TWICE  DAILY   oxyCODONE-acetaminophen (PERCOCET) 7.5-325 MG tablet Take 1-2 tablets by mouth daily as needed for severe pain (pain score 7-10).   senna (SENOKOT) 8.6 MG tablet Take 1 tablet by mouth daily.   tamsulosin  (FLOMAX) 0.4 MG CAPS capsule Take 0.4 mg by mouth in the morning and at bedtime.   timolol (TIMOPTIC) 0.5 % ophthalmic solution Place 1 drop into both eyes daily.   trolamine salicylate (BLUE-EMU MAXIMUM PAIN RELIEF) 10 % cream Apply 1 Application topically daily at 12 noon.   valsartan (DIOVAN) 80 MG tablet Take 80 mg by mouth daily.   furosemide (LASIX) 40 MG tablet Take 40 mg by mouth daily. (Patient not taking: Reported on 10/10/2023)   lidocaine (LIDODERM) 5 % Place 1 patch onto the skin daily. Remove & Discard patch within 12 hours or as directed by MD (Patient not taking: Reported on 09/19/2023)   Multiple Vitamin (MULTIVITAMIN WITH MINERALS) TABS tablet Take 1 tablet by mouth daily. 50+ (Patient not taking: Reported on 10/10/2023)   Omega-3 Fatty Acids (FISH OIL) 1200 MG CAPS Take 1 capsule by mouth daily.  (Patient not taking: Reported on 10/10/2023)   OVER THE COUNTER MEDICATION Take 1 tablet by mouth daily. Focus factor (Patient not taking: Reported on 02/05/2024)   OVER THE COUNTER MEDICATION Apply 1 application  topically daily as needed (pain). Veterinary cream (Patient not taking: Reported on 10/10/2023)   Saw Palmetto 450 MG CAPS Take 1 capsule by mouth daily. (Patient not taking: Reported on 10/10/2023)   tamsulosin (FLOMAX) 0.4 MG  CAPS capsule Take 1 capsule (0.4 mg total) by mouth daily. (Patient not taking: Reported on 02/05/2024)   traMADol (ULTRAM) 50 MG tablet Take 1 tablet (50 mg total) by mouth every 6 (six) hours as needed for moderate pain (pain score 4-6) or severe pain (pain score 7-10). (Patient not taking: Reported on 10/10/2023)   No facility-administered encounter medications on file as of 02/05/2024.    Allergies (verified) Cephalexin   History: Past Medical History:  Diagnosis Date   Arthritis    Bradycardia    Cataract    CKD (chronic kidney disease)    Glaucoma    History of renal artery stenosis 03/18/2012   right renal PTA/stenting   Hyperlipidemia     Hypertension    Renal cyst, right    Past Surgical History:  Procedure Laterality Date   APPENDECTOMY     BACK SURGERY     FOOT SURGERY     INGUINAL HERNIA REPAIR Left 09/24/2023   Procedure: LEFT HERNIA REPAIR INGUINAL ADULT WITH MESH;  Surgeon: Abigail Miyamoto, MD;  Location: [facility] OR;  Service: General;  Laterality: Left;  CHOICE/TAP BLOCK   JOINT REPLACEMENT Left    knee   kidney stent Left 2015   KNEE SURGERY Left x 2    left ankle     LEFT HEART CATHETERIZATION WITH CORONARY ANGIOGRAM N/A 03/18/2012   Procedure: LEFT HEART CATHETERIZATION WITH CORONARY ANGIOGRAM;  Surgeon: Pamella Pert, MD;  Location: [facility] CATH LAB;  Service: Cardiovascular;  Laterality: N/A;   RENAL ANGIOGRAM N/A 03/18/2012   Procedure: RENAL ANGIOGRAM;  Surgeon: Pamella Pert, MD;  Location: [facility] CATH LAB;  Service: Cardiovascular;  Laterality: N/A;   right wrist     SPINE SURGERY     Family History  Problem Relation Age of Onset   Dementia Mother    Cancer Father    Social History   Socioeconomic History   Marital status: Widowed    Spouse name: Not on file   Number of children: 2   Years of education: 12.5   Highest education level: Not on file  Occupational History   Occupation: retired    Comment: [employer]  Tobacco Use   Smoking status: Former    Current packs/day: 0.00    Average packs/day: 0.3 packs/day for 3.0 years (0.8 ttl pk-yrs)    Types: Cigarettes    Start date: 06/02/1969    Quit date: 06/02/1972    Years since quitting: 51.7   Smokeless tobacco: Never  Vaping Use   Vaping status: Never Used  Substance and Sexual Activity   Alcohol use: Not Currently   Drug use: Yes    Types: Oxycodone   Sexual activity: Not Currently  Other Topics Concern   Not on file  Social History Narrative   Lives at home by himself   Caffeine use- none   Social Drivers of Corporate investment banker Strain: Low Risk  (02/05/2024)   Overall Financial Resource Strain (CARDIA)    Difficulty of  Paying Living Expenses: Not hard at all  Food Insecurity: No Food Insecurity (02/05/2024)   Hunger Vital Sign    Worried About Running Out of Food in the Last Year: Never true    Ran Out of Food in the Last Year: Never true  Transportation Needs: No Transportation Needs (02/05/2024)   PRAPARE - Administrator, Civil Service (Medical): No    Lack of Transportation (Non-Medical): No  Physical Activity: Inactive (02/05/2024)   Exercise  Vital Sign    Days of Exercise per Week: 0 days    Minutes of Exercise per Session: 0 min  Stress: No Stress Concern Present (02/05/2024)   [employer] of Occupational Health - Occupational Stress Questionnaire    Feeling of Stress : Not at all  Social Connections: Socially Isolated (02/05/2024)   Social Connection and Isolation Panel [NHANES]    Frequency of Communication with Friends and Family: More than three times a week    Frequency of Social Gatherings with Friends and Family: Never    Attends Religious Services: Never    Database administrator or Organizations: No    Attends Banker Meetings: Never    Marital Status: Widowed    Tobacco Counseling Counseling given: Not Answered    Clinical Intake:  Pre-visit preparation completed: Yes  Pain : No/denies pain     Nutritional Status: BMI 25 -29 Overweight Nutritional Risks: None Diabetes: No  How often do you need to have someone help you when you read instructions, pamphlets, or other written materials from your doctor or pharmacy?: 1 - Never  Interpreter Needed?: No  Information entered by :: NAllen LPN   Activities of Daily Living     02/05/2024    2:38 PM 09/24/2023    8:11 AM  In your present state of health, do you have any difficulty performing the following activities:  Hearing? 1 1  Comment has hearing aids, but does not wear all the time wears aids  Vision? 1 0  Difficulty concentrating or making decisions? 0 0  Walking or climbing stairs? 1    Dressing or bathing? 0   Doing errands, shopping? 0   Preparing Food and eating ? N   Using the Toilet? N   In the past six months, have you accidently leaked urine? Y   Comment takes water pills   Do you have problems with loss of bowel control? N   Managing your Medications? N   Managing your Finances? N   Housekeeping or managing your Housekeeping? N     Patient Care Team: Dorothyann Peng, MD as PCP - General (Internal Medicine) Corky Crafts, MD as PCP - Cardiology (Cardiology) Sallye Lat, MD as Consulting Physician (Ophthalmology) Harlan Stains, [facility] (Inactive) (Pharmacist)  Indicate any recent Medical Services you may have received from other than [facility] providers in the past year (date may be approximate).     Assessment:   This is a routine wellness examination for Joanna.  Hearing/Vision screen Hearing Screening - Comments:: Has hearing aids but does not wear all the time Vision Screening - Comments:: Regular eye exams, [facility]   Goals Addressed             This Visit's Progress    Patient Stated       02/05/2024, denies goals       Depression Screen     02/05/2024    2:46 PM 10/10/2023    2:59 PM 09/13/2023   12:37 PM 09/12/2023   11:09 AM 01/30/2023    2:14 PM 09/12/2022   11:40 AM 01/11/2022    2:22 PM  PHQ 2/9 Scores  PHQ - 2 Score 0 1 0 0 0 0 1  PHQ- 9 Score 3   0       Fall Risk     02/05/2024    2:44 PM 09/20/2023    4:29 PM 09/12/2023   11:09 AM 01/30/2023    2:13 PM  12/26/2022   11:44 AM  Fall Risk   Falls in the past year? 0 0 0 1 1  Comment    lost balance   Number falls in past yr: 0 0 0 1 0  Injury with Fall? 0 0 0 0 0  Risk for fall due to : Impaired mobility;Impaired balance/gait;Medication side effect  No Fall Risks Impaired balance/gait;History of fall(s);Impaired mobility;Medication side effect History of fall(s);Impaired balance/gait  Follow up Falls prevention discussed;Falls evaluation completed  Falls  evaluation completed Falls evaluation completed;Education provided;Falls prevention discussed Falls evaluation completed    MEDICARE RISK AT HOME:  Medicare Risk at Home Any stairs in or around the home?: Yes If so, are there any without handrails?: No Home free of loose throw rugs in walkways, pet beds, electrical cords, etc?: Yes Adequate lighting in your home to reduce risk of falls?: Yes Life alert?: No Use of a cane, walker or w/c?: Yes Grab bars in the bathroom?: Yes Shower chair or bench in shower?: Yes Elevated toilet seat or a handicapped toilet?: No  TIMED UP AND GO:  Was the test performed?  No  Cognitive Function: 6CIT completed        02/05/2024    2:47 PM 01/30/2023    2:18 PM 01/11/2022    2:26 PM 01/04/2021    2:13 PM 12/30/2019    2:27 PM  6CIT Screen  What Year? 0 points 4 points 0 points 0 points 0 points  What month? 0 points 0 points 0 points 0 points 0 points  What time? 0 points 0 points 0 points 0 points 0 points  Count back from 20 0 points 0 points 0 points 0 points 0 points  Months in reverse 4 points 0 points 4 points 0 points 0 points  Repeat phrase 4 points 4 points 6 points 8 points 4 points  Total Score 8 points 8 points 10 points 8 points 4 points    Immunizations Immunization History  Administered Date(s) Administered   Fluad Quad(high Dose 65+) 08/07/2019, 08/15/2020, 08/29/2021, 09/12/2022   Fluad Trivalent(High Dose 65+) 09/12/2023   Influenza, High Dose Seasonal PF 09/06/2017   Influenza-Unspecified 08/04/2018   Moderna SARS-COV2 Booster Vaccination 09/30/2020   Moderna Sars-Covid-2 Vaccination 12/27/2019, 01/24/2020   PFIZER(Purple Top)SARS-COV-2 Vaccination 05/29/2021   Pfizer Covid-19 Vaccine Bivalent Booster 5y-11y 06/13/2022   Pfizer(Comirnaty)Fall Seasonal Vaccine 12 years and older 09/12/2023   Pneumococcal Conjugate-13 08/27/2019   Pneumococcal Polysaccharide-23 06/09/2010   Pneumococcal-Unspecified 06/18/2011   Tdap  09/26/2021   Zoster Recombinant(Shingrix) 08/27/2019, 10/26/2019    Screening Tests Health Maintenance  Topic Date Due   COVID-19 Vaccine (6 - 2024-25 season) 11/07/2023   Medicare Annual Wellness (AWV)  02/04/2025   DTaP/Tdap/Td (2 - Td or Tdap) 09/27/2031   Pneumonia Vaccine 78+ Years old  Completed   INFLUENZA VACCINE  Completed   Zoster Vaccines- Shingrix  Completed   HPV VACCINES  Aged Out    Health Maintenance  Health Maintenance Due  Topic Date Due   COVID-19 Vaccine (6 - 2024-25 season) 11/07/2023   Health Maintenance Items Addressed: Up to date  Additional Screening:  Vision Screening: Recommended annual ophthalmology exams for early detection of glaucoma and other disorders of the eye.  Dental Screening: Recommended annual dental exams for proper oral hygiene  Community Resource Referral / Chronic Care Management: CRR required this visit?  No   CCM required this visit?  No     Plan:     I have personally reviewed  and noted the following in the patient's chart:   Medical and social history Use of alcohol, tobacco or illicit drugs  Current medications and supplements including opioid prescriptions. Patient is currently taking opioid prescriptions. Information provided to patient regarding non-opioid alternatives. Patient advised to discuss non-opioid treatment plan with their provider. Functional ability and status Nutritional status Physical activity Advanced directives List of other physicians Hospitalizations, surgeries, and ER visits in previous 12 months Vitals Screenings to include cognitive, depression, and falls Referrals and appointments  In addition, I have reviewed and discussed with patient certain preventive protocols, quality metrics, and best practice recommendations. A written personalized care plan for preventive services as well as general preventive health recommendations were provided to patient.     Barb Merino,  LPN   12/08/1094   After Visit Summary: (In Person-Printed) AVS printed and given to the patient  Notes: Nothing significant to report at this time.

## 2024-02-06 LAB — CBC
Hematocrit: 34.5 % — ABNORMAL LOW (ref 37.5–51.0)
Hemoglobin: 11.9 g/dL — ABNORMAL LOW (ref 13.0–17.7)
MCH: 34 pg — ABNORMAL HIGH (ref 26.6–33.0)
MCHC: 34.5 g/dL (ref 31.5–35.7)
MCV: 99 fL — ABNORMAL HIGH (ref 79–97)
Platelets: 187 10*3/uL (ref 150–450)
RBC: 3.5 x10E6/uL — ABNORMAL LOW (ref 4.14–5.80)
RDW: 12.4 % (ref 11.6–15.4)
WBC: 4.7 10*3/uL (ref 3.4–10.8)

## 2024-02-06 LAB — IRON,TIBC AND FERRITIN PANEL
Ferritin: 137 ng/mL (ref 30–400)
Iron Saturation: 27 % (ref 15–55)
Iron: 77 ug/dL (ref 38–169)
Total Iron Binding Capacity: 290 ug/dL (ref 250–450)
UIBC: 213 ug/dL (ref 111–343)

## 2024-02-06 LAB — PREALBUMIN: PREALBUMIN: 23 mg/dL (ref 9–32)

## 2024-02-10 ENCOUNTER — Other Ambulatory Visit: Payer: Self-pay | Admitting: Internal Medicine

## 2024-02-13 LAB — B12 AND FOLATE PANEL
Folate: >20 ng/mL (ref >3.0)
Vitamin B-12: 1170 pg/mL (ref 232–1245)

## 2024-02-13 LAB — SPECIMEN STATUS REPORT

## 2024-02-15 DIAGNOSIS — R630 Anorexia: Secondary | ICD-10-CM | POA: Insufficient documentation

## 2024-02-15 NOTE — Assessment & Plan Note (Signed)
 Chronic, LDL goal is less than 70.  He will continue with atorvastatin 20mg  w/ MWF dosing. He has been unable to tolerate daily dosing.

## 2024-02-15 NOTE — Assessment & Plan Note (Signed)
 I will check labs as below. He does not wish to take rx meds to increase his appetite. He is encouraged to prioritize his protein intake.

## 2024-02-15 NOTE — Assessment & Plan Note (Signed)
 Chronic, well controlled. He reports compliance with meds. He will continue with amlodipine 5mg  daily, valsartan 80mg   daily and furosemide as per Renal.  He is encouraged to follow low sodium diet. No med changes today. He will rto in March 2025 for his next evaluation.

## 2024-02-15 NOTE — Assessment & Plan Note (Signed)
 Chronic, he is encouraged to stay well hydrated, avoid NSAIDs and keep BP controlled to prevent progression of CKD.

## 2024-02-15 NOTE — Assessment & Plan Note (Addendum)
 Chronic, I will check labs as below. Renal input is appreciated.  I advised him that the anemia could contribute to his fatigue, as well as the CKD.

## 2024-02-15 NOTE — Assessment & Plan Note (Signed)
Chronic, LDL goal is less than 70. He will continue with atorvastatin 20mg  MWF dosing.

## 2024-02-29 ENCOUNTER — Encounter: Payer: Self-pay | Admitting: Internal Medicine

## 2024-03-11 ENCOUNTER — Other Ambulatory Visit: Payer: Self-pay | Admitting: Internal Medicine

## 2024-03-11 DIAGNOSIS — M19071 Primary osteoarthritis, right ankle and foot: Secondary | ICD-10-CM | POA: Diagnosis not present

## 2024-03-11 DIAGNOSIS — M19072 Primary osteoarthritis, left ankle and foot: Secondary | ICD-10-CM | POA: Diagnosis not present

## 2024-03-17 ENCOUNTER — Other Ambulatory Visit: Payer: Self-pay | Admitting: Urology

## 2024-03-17 DIAGNOSIS — D49512 Neoplasm of unspecified behavior of left kidney: Secondary | ICD-10-CM

## 2024-03-26 ENCOUNTER — Ambulatory Visit
Admission: RE | Admit: 2024-03-26 | Discharge: 2024-03-26 | Discharge status: Home / Self Care | Source: Ambulatory Visit | Attending: Urology

## 2024-03-26 DIAGNOSIS — N281 Cyst of kidney, acquired: Secondary | ICD-10-CM | POA: Diagnosis not present

## 2024-03-26 DIAGNOSIS — D49512 Neoplasm of unspecified behavior of left kidney: Secondary | ICD-10-CM

## 2024-03-30 DIAGNOSIS — H33052 Total retinal detachment, left eye: Secondary | ICD-10-CM | POA: Diagnosis not present

## 2024-03-30 DIAGNOSIS — Z961 Presence of intraocular lens: Secondary | ICD-10-CM | POA: Diagnosis not present

## 2024-03-30 DIAGNOSIS — N1832 Chronic kidney disease, stage 3b: Secondary | ICD-10-CM | POA: Diagnosis not present

## 2024-03-30 DIAGNOSIS — H04123 Dry eye syndrome of bilateral lacrimal glands: Secondary | ICD-10-CM | POA: Diagnosis not present

## 2024-03-30 DIAGNOSIS — H02403 Unspecified ptosis of bilateral eyelids: Secondary | ICD-10-CM | POA: Diagnosis not present

## 2024-03-30 DIAGNOSIS — H401131 Primary open-angle glaucoma, bilateral, mild stage: Secondary | ICD-10-CM | POA: Diagnosis not present

## 2024-04-08 DIAGNOSIS — R609 Edema, unspecified: Secondary | ICD-10-CM | POA: Diagnosis not present

## 2024-04-08 DIAGNOSIS — N2889 Other specified disorders of kidney and ureter: Secondary | ICD-10-CM | POA: Diagnosis not present

## 2024-04-08 DIAGNOSIS — N261 Atrophy of kidney (terminal): Secondary | ICD-10-CM | POA: Diagnosis not present

## 2024-04-08 DIAGNOSIS — N1832 Chronic kidney disease, stage 3b: Secondary | ICD-10-CM | POA: Diagnosis not present

## 2024-04-08 DIAGNOSIS — D631 Anemia in chronic kidney disease: Secondary | ICD-10-CM | POA: Diagnosis not present

## 2024-04-08 DIAGNOSIS — I129 Hypertensive chronic kidney disease with stage 1 through stage 4 chronic kidney disease, or unspecified chronic kidney disease: Secondary | ICD-10-CM | POA: Diagnosis not present

## 2024-04-08 DIAGNOSIS — E785 Hyperlipidemia, unspecified: Secondary | ICD-10-CM | POA: Diagnosis not present

## 2024-04-16 ENCOUNTER — Ambulatory Visit (INDEPENDENT_AMBULATORY_CARE_PROVIDER_SITE_OTHER): Admitting: Internal Medicine

## 2024-04-16 ENCOUNTER — Encounter: Payer: Self-pay | Admitting: Internal Medicine

## 2024-04-16 VITALS — BP 122/70 | HR 50 | Temp 98.8°F | Ht 63.0 in | Wt 153.8 lb

## 2024-04-16 DIAGNOSIS — I1310 Hypertensive heart and chronic kidney disease without heart failure, with stage 1 through stage 4 chronic kidney disease, or unspecified chronic kidney disease: Secondary | ICD-10-CM

## 2024-04-16 DIAGNOSIS — R600 Localized edema: Secondary | ICD-10-CM | POA: Diagnosis not present

## 2024-04-16 DIAGNOSIS — I701 Atherosclerosis of renal artery: Secondary | ICD-10-CM | POA: Diagnosis not present

## 2024-04-16 DIAGNOSIS — Z23 Encounter for immunization: Secondary | ICD-10-CM

## 2024-04-16 DIAGNOSIS — H6123 Impacted cerumen, bilateral: Secondary | ICD-10-CM | POA: Diagnosis not present

## 2024-04-16 DIAGNOSIS — N1832 Chronic kidney disease, stage 3b: Secondary | ICD-10-CM

## 2024-04-16 DIAGNOSIS — L03116 Cellulitis of left lower limb: Secondary | ICD-10-CM | POA: Diagnosis not present

## 2024-04-16 MED ORDER — DOXYCYCLINE HYCLATE 100 MG PO TABS: 100.0000 mg | ORAL_TABLET | Freq: Two times a day (BID) | ORAL | 0 refills | Status: DC

## 2024-04-16 NOTE — Progress Notes (Signed)
 I,Victoria T Basil Lim, CMA,acting as a Neurosurgeon for Smiley Dung, MD.,have documented all relevant documentation on the behalf of Smiley Dung, MD,as directed by  Smiley Dung, MD while in the presence of Smiley Dung, MD.  Subjective:  Patient ID: Wayne [name] , male    DOB: 1926-11-26 , 89 y.o.   MRN: [id number]  Chief Complaint  Patient presents with   Hypertension    Pt arrived for his bp check and to get his ear cleaned. Pt denies any headache, chest pain & sob. Pt will like to have both of his legs looked at as well.     HPI Discussed the use of AI scribe software for clinical note transcription with the patient, who gave verbal consent to proceed.  History of Present Illness Wayne [name] is a 89 year old male who presents for bp check,  ear flushing and evaluation of a leg rash.  He presents for ear flushing after being advised to have his hearing checked. There is no decrease in hearing with his hearing aid, but he was advised to have his ears cleaned out.  He has a rash on his leg that began about a year ago. Initially, a dime-sized area appeared on his leg, which started to exude a clear fluid. A similar lesion appeared on the other leg. He wraps his leg to prevent swelling. No pain, itching, or scratching of the rash. He has been applying a cream at home, which he used previously for a similar issue.  He saw his kidney doctor last month but did not receive any new medications except for continuing his diuretic. Blood work was done in February, but not during the last visit. He is concerned that there have been no further instructions regarding the rash.  He is currently taking diuretics for swelling and has difficulty walking, which limits his outdoor activities. He prefers afternoon appointments due to morning stiffness from arthritis. He lives a relatively sedentary lifestyle, primarily staying indoors due to difficulty walking.   Hypertension This is a  chronic problem. The current episode started more than 1 year ago. The problem has been gradually improving since onset. The problem is controlled. Past treatments include calcium  channel blockers, angiotensin blockers, diuretics and lifestyle changes. The current treatment provides moderate improvement. Hypertensive end-organ damage includes kidney disease.     Past Medical History:  Diagnosis Date   Arthritis    Bradycardia    Cataract    CKD (chronic kidney disease)    Glaucoma    History of renal artery stenosis 03/18/2012   right renal PTA/stenting   Hyperlipidemia    Hypertension    Renal cyst, right      Family History  Problem Relation Age of Onset   Dementia Mother    Cancer Father      Current Outpatient Medications:    amLODipine  (NORVASC ) 5 MG tablet, TAKE 1 TABLET BY MOUTH DAILY, Disp: 70 tablet, Rfl: 4   aspirin  EC 81 MG tablet, Take 81 mg by mouth daily., Disp: , Rfl:    atorvastatin  (LIPITOR) 20 MG tablet, TAKE M,W,F ONLY., Disp: 90 tablet, Rfl: 2   doxycycline  (VIBRA -TABS) 100 MG tablet, Take 1 tablet (100 mg total) by mouth 2 (two) times daily., Disp: 20 tablet, Rfl: 0   Emu Oil OIL, by Does not apply route., Disp: , Rfl:    furosemide (LASIX) 40 MG tablet, Take 40 mg by mouth 2 (two) times daily. Take 2 tabs in  the morning; and 1 tab in the evening, Disp: , Rfl:    gabapentin  (NEURONTIN ) 300 MG capsule, TAKE 1 CAPSULE BY MOUTH TWICE  DAILY, Disp: 200 capsule, Rfl: 2   lidocaine  (LIDODERM ) 5 %, Place 1 patch onto the skin daily. Remove & Discard patch within 12 hours or as directed by MD, Disp: 30 patch, Rfl: 0   Multiple Vitamin (MULTIVITAMIN WITH MINERALS) TABS tablet, Take 1 tablet by mouth daily. 50+, Disp: , Rfl:    Omega-3 Fatty Acids (FISH OIL) 1200 MG CAPS, Take 1 capsule by mouth daily., Disp: , Rfl:    oxyCODONE-acetaminophen  (PERCOCET) 7.5-325 MG tablet, Take 1-2 tablets by mouth daily as needed for severe pain (pain score 7-10)., Disp: , Rfl:    senna  (SENOKOT) 8.6 MG tablet, Take 1 tablet by mouth daily., Disp: , Rfl:    tamsulosin  (FLOMAX ) 0.4 MG CAPS capsule, Take 0.4 mg by mouth in the morning and at bedtime., Disp: , Rfl:    tamsulosin  (FLOMAX ) 0.4 MG CAPS capsule, TAKE 1 CAPSULE BY MOUTH DAILY, Disp: 100 capsule, Rfl: 2   timolol  (TIMOPTIC ) 0.5 % ophthalmic solution, Place 1 drop into both eyes daily., Disp: , Rfl:    valsartan  (DIOVAN ) 80 MG tablet, Take 80 mg by mouth daily., Disp: , Rfl:    furosemide (LASIX) 40 MG tablet, Take 40 mg by mouth daily. (Patient not taking: Reported on 10/10/2023), Disp: , Rfl:    OVER THE COUNTER MEDICATION, Take 1 tablet by mouth daily. Focus factor (Patient not taking: Reported on 02/05/2024), Disp: , Rfl:    OVER THE COUNTER MEDICATION, Apply 1 application  topically daily as needed (pain). Veterinary cream (Patient not taking: Reported on 10/10/2023), Disp: , Rfl:    Saw Palmetto 450 MG CAPS, Take 1 capsule by mouth daily. (Patient not taking: Reported on 10/10/2023), Disp: , Rfl:    traMADol  (ULTRAM ) 50 MG tablet, Take 1 tablet (50 mg total) by mouth every 6 (six) hours as needed for moderate pain (pain score 4-6) or severe pain (pain score 7-10). (Patient not taking: Reported on 10/10/2023), Disp: 20 tablet, Rfl: 0   trolamine salicylate (BLUE-EMU MAXIMUM PAIN RELIEF) 10 % cream, Apply 1 Application topically daily at 12 noon. (Patient not taking: Reported on 04/16/2024), Disp: , Rfl:    Allergies  Allergen Reactions   Cephalexin     Severe mouth and throat dryness.     Review of Systems  Constitutional: Negative.   HENT: Negative.    Respiratory: Negative.    Cardiovascular: Negative.   Gastrointestinal: Negative.   Skin: Negative.   Allergic/Immunologic: Negative.   Neurological: Negative.   Hematological: Negative.      Today's Vitals   04/16/24 1414  BP: 122/70  Pulse: (!) 50  Temp: 98.8 F (37.1 C)  SpO2: 98%  Weight: 153 lb 12.8 oz (69.8 kg)  Height: 5\' 3"  (1.6 m)  PainSc:  10-Worst pain ever  PainLoc: Leg   Body mass index is 27.24 kg/m.  Wt Readings from Last 3 Encounters:  04/21/24 154 lb 3.2 oz (69.9 kg)  04/16/24 153 lb 12.8 oz (69.8 kg)  02/05/24 153 lb (69.4 kg)     Objective:  Physical Exam Vitals and nursing note reviewed.  Constitutional:      Appearance: Normal appearance.  HENT:     Head: Normocephalic and atraumatic.     Right Ear: Ear canal and external ear normal. There is impacted cerumen.     Left Ear: Ear canal and  external ear normal. There is impacted cerumen.     Nose:     Comments: Masked     Mouth/Throat:     Comments: Masked  Eyes:     Extraocular Movements: Extraocular movements intact.  Cardiovascular:     Rate and Rhythm: Normal rate and regular rhythm.     Heart sounds: Normal heart sounds.  Pulmonary:     Effort: Pulmonary effort is normal.     Breath sounds: Normal breath sounds.  Musculoskeletal:     Cervical back: Normal range of motion.     Right lower leg: Edema present.     Left lower leg: Edema present.  Skin:    General: Skin is warm.     Findings: Erythema present.     Comments: Erythema LLE LLE warm to touch  Neurological:     General: No focal deficit present.     Mental Status: He is alert.  Psychiatric:        Mood and Affect: Mood normal.         Assessment And Plan:  Hypertensive heart and renal disease with renal failure, stage 1 through stage 4 or unspecified chronic kidney disease, without heart failure Assessment & Plan: Chronic, well controlled. He reports compliance with meds. He will continue with amlodipine  5mg  daily, valsartan  80mg   daily and furosemide as per Renal.  He is encouraged to follow low sodium diet. No med changes today.    Atherosclerosis of renal artery (HCC) Assessment & Plan: Chronic, LDL goal is less than 70. He will continue with atorvastatin  20mg  MWF dosing.    Stage 3b chronic kidney disease (HCC) Assessment & Plan: Chronic, he is encouraged to stay  well hydrated, avoid NSAIDs and keep BP controlled to prevent progression of CKD.   -Continue regular follow up with Nephrology   Bilateral impacted cerumen Assessment & Plan: Hearing loss with audiologist's recommendation for ear cleaning. - Proceed with ear flushing. -.After obtaining verbal consent, both ears were flushed by irrigation. No TM abnormalities were noted. He tolerated procedure well without any complications.     Orders: -     Ear Lavage  Cellulitis of left lower extremity Assessment & Plan: Cellulitis with swelling and discharge, no pain or itching. Doxycycline  chosen due to adverse reaction to Cefalexin. - Prescribed doxycycline  twice daily with food for 10 days. - Advised against prolonged sun exposure while on doxycycline . - Scheduled follow-up for Tuesday at 2:20 PM to assess antibiotic response.   Bilateral lower extremity edema Assessment & Plan: Chronic, edema in legs managed with diuretics as per nephrologist's prescription. - Continue current diuretic therapy.   Immunization due -     Pfizer Comirnaty Covid-19 Vaccine 23yrs & older  Other orders -     Doxycycline  Hyclate; Take 1 tablet (100 mg total) by mouth 2 (two) times daily.  Dispense: 20 tablet; Refill: 0   Return if symptoms worsen or fail to improve.  Patient was given opportunity to ask questions. Patient verbalized understanding of the plan and was able to repeat key elements of the plan. All questions were answered to their satisfaction.    I, Smiley Dung, MD, have reviewed all documentation for this visit. The documentation on 04/16/24 for the exam, diagnosis, procedures, and orders are all accurate and complete.   IF YOU HAVE BEEN REFERRED TO A SPECIALIST, IT MAY TAKE 1-2 WEEKS TO SCHEDULE/PROCESS THE REFERRAL. IF YOU HAVE NOT HEARD FROM US /SPECIALIST IN TWO WEEKS, PLEASE GIVE US  A CALL  AT [phone].   THE PATIENT IS ENCOURAGED TO PRACTICE SOCIAL DISTANCING DUE TO THE  COVID-19 PANDEMIC.

## 2024-04-21 ENCOUNTER — Ambulatory Visit (INDEPENDENT_AMBULATORY_CARE_PROVIDER_SITE_OTHER): Admitting: Internal Medicine

## 2024-04-21 ENCOUNTER — Encounter: Payer: Self-pay | Admitting: Internal Medicine

## 2024-04-21 VITALS — BP 122/70 | HR 80 | Temp 98.4°F | Ht 63.0 in | Wt 154.2 lb

## 2024-04-21 DIAGNOSIS — I872 Venous insufficiency (chronic) (peripheral): Secondary | ICD-10-CM | POA: Insufficient documentation

## 2024-04-21 DIAGNOSIS — G5601 Carpal tunnel syndrome, right upper limb: Secondary | ICD-10-CM | POA: Diagnosis not present

## 2024-04-21 DIAGNOSIS — L0390 Cellulitis, unspecified: Secondary | ICD-10-CM | POA: Diagnosis not present

## 2024-04-21 DIAGNOSIS — M7660 Achilles tendinitis, unspecified leg: Secondary | ICD-10-CM

## 2024-04-21 NOTE — Progress Notes (Unsigned)
 I,Victoria T Basil Lim, CMA,acting as a Neurosurgeon for Smiley Dung, MD.,have documented all relevant documentation on the behalf of Smiley Dung, MD,as directed by  Smiley Dung, MD while in the presence of Smiley Dung, MD.  Subjective:  Patient ID: Wayne [name] , male    DOB: 06/30/1926 , 89 y.o.   MRN: [id number]  Chief Complaint  Patient presents with   Cellulitis    Patient presents today for Cellulitis follow up. He reports feeling about the same. He still experiences leg pain. He would like a referral to orthopedic. He experiences right hand numbness & tingling. He requests to go to [facility].     HPI Discussed the use of AI scribe software for clinical note transcription with the patient, who gave verbal consent to proceed.  History of Present Illness Wayne [name] is a 89 year old male who presents for follow-up of cellulitis.  He experiences pain in his legs when walking, which he attributes to arthritis. Despite taking antibiotics, he still has significant edema, and the diuretic is not effectively reducing the swelling, only increasing his trips to the bathroom. He is not very active at home.  He has numbness in his hand from the elbow down, with no feeling in his thumb and two fingers, describing the sensation as aching 'just like a toothache'. He has a history of a pinched nerve in his neck diagnosed nine years ago, which he opted not to treat with surgery or injections. He also has a history of carpal tunnel syndrome, for which he received a shot that temporarily alleviated symptoms. He wears a splint at night to manage the symptoms.  He recalls having a vascular study done in August 2017, ordered by his foot doctor. He experiences pain in his Achilles tendon when walking but denies calf pain. He used to see a foot doctor who provided devices for his feet, which initially felt good but later caused discomfort, leading him to stop using them. He stopped  seeing the foot doctor in 2020 for reasons he cannot recall.    Past Medical History:  Diagnosis Date   Arthritis    Bradycardia    Cataract    CKD (chronic kidney disease)    Glaucoma    History of renal artery stenosis 03/18/2012   right renal PTA/stenting   Hyperlipidemia    Hypertension    Renal cyst, right      Family History  Problem Relation Age of Onset   Dementia Mother    Cancer Father      Current Outpatient Medications:    amLODipine  (NORVASC ) 5 MG tablet, TAKE 1 TABLET BY MOUTH DAILY, Disp: 70 tablet, Rfl: 4   aspirin  EC 81 MG tablet, Take 81 mg by mouth daily., Disp: , Rfl:    atorvastatin  (LIPITOR) 20 MG tablet, TAKE M,W,F ONLY., Disp: 90 tablet, Rfl: 2   doxycycline  (VIBRA -TABS) 100 MG tablet, Take 1 tablet (100 mg total) by mouth 2 (two) times daily., Disp: 20 tablet, Rfl: 0   Emu Oil OIL, by Does not apply route., Disp: , Rfl:    furosemide (LASIX) 40 MG tablet, Take 40 mg by mouth 2 (two) times daily. Take 2 tabs in the morning; and 1 tab in the evening, Disp: , Rfl:    gabapentin  (NEURONTIN ) 300 MG capsule, TAKE 1 CAPSULE BY MOUTH TWICE  DAILY, Disp: 200 capsule, Rfl: 2   lidocaine  (LIDODERM ) 5 %, Place 1 patch onto the skin daily. Remove &  Discard patch within 12 hours or as directed by MD, Disp: 30 patch, Rfl: 0   Multiple Vitamin (MULTIVITAMIN WITH MINERALS) TABS tablet, Take 1 tablet by mouth daily. 50+, Disp: , Rfl:    Omega-3 Fatty Acids (FISH OIL) 1200 MG CAPS, Take 1 capsule by mouth daily., Disp: , Rfl:    oxyCODONE-acetaminophen  (PERCOCET) 7.5-325 MG tablet, Take 1-2 tablets by mouth daily as needed for severe pain (pain score 7-10)., Disp: , Rfl:    senna (SENOKOT) 8.6 MG tablet, Take 1 tablet by mouth daily., Disp: , Rfl:    tamsulosin  (FLOMAX ) 0.4 MG CAPS capsule, Take 0.4 mg by mouth in the morning and at bedtime., Disp: , Rfl:    tamsulosin  (FLOMAX ) 0.4 MG CAPS capsule, TAKE 1 CAPSULE BY MOUTH DAILY, Disp: 100 capsule, Rfl: 2   timolol   (TIMOPTIC ) 0.5 % ophthalmic solution, Place 1 drop into both eyes daily., Disp: , Rfl:    valsartan  (DIOVAN ) 80 MG tablet, Take 80 mg by mouth daily., Disp: , Rfl:    furosemide (LASIX) 40 MG tablet, Take 40 mg by mouth daily. (Patient not taking: Reported on 10/10/2023), Disp: , Rfl:    OVER THE COUNTER MEDICATION, Take 1 tablet by mouth daily. Focus factor (Patient not taking: Reported on 02/05/2024), Disp: , Rfl:    OVER THE COUNTER MEDICATION, Apply 1 application  topically daily as needed (pain). Veterinary cream (Patient not taking: Reported on 10/10/2023), Disp: , Rfl:    Saw Palmetto 450 MG CAPS, Take 1 capsule by mouth daily. (Patient not taking: Reported on 10/10/2023), Disp: , Rfl:    traMADol  (ULTRAM ) 50 MG tablet, Take 1 tablet (50 mg total) by mouth every 6 (six) hours as needed for moderate pain (pain score 4-6) or severe pain (pain score 7-10). (Patient not taking: Reported on 10/10/2023), Disp: 20 tablet, Rfl: 0   trolamine salicylate (BLUE-EMU MAXIMUM PAIN RELIEF) 10 % cream, Apply 1 Application topically daily at 12 noon. (Patient not taking: Reported on 04/16/2024), Disp: , Rfl:    Allergies  Allergen Reactions   Cephalexin     Severe mouth and throat dryness.     Review of Systems  Constitutional: Negative.   HENT: Negative.    Respiratory: Negative.    Cardiovascular: Negative.   Gastrointestinal: Negative.   Endocrine: Negative.   Skin: Negative.   Allergic/Immunologic: Negative.   Neurological:  Positive for numbness.  Hematological: Negative.      Today's Vitals   04/21/24 1431  BP: 122/70  Pulse: 80  Temp: 98.4 F (36.9 C)  SpO2: 98%  Weight: 154 lb 3.2 oz (69.9 kg)  Height: 5\' 3"  (1.6 m)   Body mass index is 27.32 kg/m.  Wt Readings from Last 3 Encounters:  04/21/24 154 lb 3.2 oz (69.9 kg)  04/16/24 153 lb 12.8 oz (69.8 kg)  02/05/24 153 lb (69.4 kg)     Objective:  Physical Exam Vitals and nursing note reviewed.  Constitutional:      Appearance:  Normal appearance.  HENT:     Head: Normocephalic and atraumatic.  Eyes:     Extraocular Movements: Extraocular movements intact.  Cardiovascular:     Rate and Rhythm: Normal rate and regular rhythm.     Heart sounds: Normal heart sounds.  Pulmonary:     Effort: Pulmonary effort is normal.     Breath sounds: Normal breath sounds.  Musculoskeletal:     Cervical back: Normal range of motion.  Skin:    General: Skin is warm.  Comments: Decreased erythema LLE, no vesicular lesions noted  Neurological:     General: No focal deficit present.     Mental Status: He is alert.  Psychiatric:        Mood and Affect: Mood normal.         Assessment And Plan:  Cellulitis, unspecified cellulitis site Assessment & Plan: Improvement with antibiotics, decreased skin warmth and erythema noted. - Complete full course of antibiotics. - Encourage heel raises to enhance circulation.   Right carpal tunnel syndrome Assessment & Plan: Numbness and aching likely due to carpal tunnel syndrome, uses splint at night. - Advise continued use of wrist splint during sleep. - Consider further evaluation if symptoms persist.  Orders: -     Ambulatory referral to Hand Surgery  Venous insufficiency of both lower extremities Assessment & Plan: Persistent despite diuretics, limited mobility may contribute. - Encourage heel raises to enhance circulation. - Consider vascular specialist referral if no improvement.   Achilles tendinitis, unspecified laterality Assessment & Plan: Pain persists during ambulation, orthotics provided relief but discomfort with prolonged use. - Encourage mobility exercises to enhance circulation. - Consider foot specialist re-evaluation if pain persists.    Return if symptoms worsen or fail to improve.  Patient was given opportunity to ask questions. Patient verbalized understanding of the plan and was able to repeat key elements of the plan. All questions were answered  to their satisfaction.     I, Smiley Dung, MD, have reviewed all documentation for this visit. The documentation on 04/21/24 for the exam, diagnosis, procedures, and orders are all accurate and complete.  IF YOU HAVE BEEN REFERRED TO A SPECIALIST, IT MAY TAKE 1-2 WEEKS TO SCHEDULE/PROCESS THE REFERRAL. IF YOU HAVE NOT HEARD FROM US /SPECIALIST IN TWO WEEKS, PLEASE GIVE US  A CALL AT [phone].   THE PATIENT IS ENCOURAGED TO PRACTICE SOCIAL DISTANCING DUE TO THE COVID-19 PANDEMIC.

## 2024-04-21 NOTE — Patient Instructions (Signed)
 Cellulitis, Adult    Cellulitis is a skin infection. The infected area is often warm, red, swollen, and sore. It occurs most often on the legs, feet, and toes, but can happen on any part of the body.  This condition can be life-threatening without treatment. It is very important to get treated right away.  What are the causes?  This condition is caused by bacteria. The bacteria enter through a break in the skin, such as:  A cut.  A burn.  A bug bite.  An animal bite.  An open sore.  A crack.  What increases the risk?  Having a weak body's defense system (immune system).  Being older than 88 years old.  Having a blood sugar problem (diabetes).  Having a long-term liver disease (cirrhosis) or kidney disease.  Being very overweight (obese).  Having a skin problem, such as:  An itchy rash.  A rash caused by a fungus.  A rash with blisters.  Slow movement of blood in the veins (venous stasis).  Fluid buildup below the skin (edema).  This condition is more likely to occur in people who:  Have open cuts, burns, bites, or scrapes on the skin.  Have been treated with high-energy rays (radiation).  Use IV drugs.  What are the signs or symptoms?  Skin that:  Looks red or purple, or slightly darker than your usual skin color.  Has streaks.  Has spots.  Is swollen.  Is sore or painful when you touch it.  Is warm.  A fever.  Chills.  Blisters.  Tiredness (fatigue).  How is this treated?  Medicines to treat infections or allergies.  Rest.  Placing cold or warm cloths on the skin.  Staying in the hospital, if the condition is very bad. You may need medicines through an IV.  Follow these instructions at home:  Medicines  Take over-the-counter and prescription medicines only as told by your doctor.  If you were prescribed antibiotics, take them as told by your doctor. Do not stop using them even if you start to feel better.  General instructions  Drink enough fluid to keep your pee (urine) pale yellow.  Do not touch or rub the  infected area.  Raise (elevate) the infected area above the level of your heart while you are sitting or lying down.  Return to your normal activities when your doctor says that it is safe.  Place cold or warm cloths on the area as told by your doctor.  Keep all follow-up visits. Your doctor will need to make sure that a more serious infection is not developing.  Contact a doctor if:  You have a fever.  You do not start to get better after 1-2 days of treatment.  Your bone or joint under the infected area starts to hurt after the skin has healed.  Your infection comes back in the same area or another area. Signs of this may include:  You have a swollen bump in the area.  Your red area gets larger, turns dark in color, or hurts more.  You have more fluid coming from the wound.  Pus or a bad smell develops in your infected area.  You have more pain.  You feel sick and have muscle aches and weakness.  You develop vomiting or watery poop that will not go away.  Get help right away if:  You see red streaks coming from the area.  You notice the skin turns purple or black and falls  off.  These symptoms may be an emergency. Get help right away. Call 911.  Do not wait to see if the symptoms will go away.  Do not drive yourself to the hospital.  This information is not intended to replace advice given to you by your health care provider. Make sure you discuss any questions you have with your health care provider.  Document Revised: 07/17/2022 Document Reviewed: 07/17/2022  Elsevier Patient Education  2024 [employer].

## 2024-04-25 DIAGNOSIS — R600 Localized edema: Secondary | ICD-10-CM | POA: Insufficient documentation

## 2024-04-25 DIAGNOSIS — H6123 Impacted cerumen, bilateral: Secondary | ICD-10-CM | POA: Insufficient documentation

## 2024-04-25 NOTE — Assessment & Plan Note (Signed)
 Chronic, well controlled. He reports compliance with meds. He will continue with amlodipine  5mg  daily, valsartan  80mg   daily and furosemide as per Renal.  He is encouraged to follow low sodium diet. No med changes today.

## 2024-04-25 NOTE — Assessment & Plan Note (Signed)
Chronic, LDL goal is less than 70. He will continue with atorvastatin 20mg  MWF dosing.

## 2024-04-25 NOTE — Assessment & Plan Note (Signed)
 Chronic, edema in legs managed with diuretics as per nephrologist's prescription. - Continue current diuretic therapy.

## 2024-04-25 NOTE — Patient Instructions (Signed)
 Cellulitis, Adult    Cellulitis is a skin infection. The infected area is often warm, red, swollen, and sore. It occurs most often on the legs, feet, and toes, but can happen on any part of the body.  This condition can be life-threatening without treatment. It is very important to get treated right away.  What are the causes?  This condition is caused by bacteria. The bacteria enter through a break in the skin, such as:  A cut.  A burn.  A bug bite.  An animal bite.  An open sore.  A crack.  What increases the risk?  Having a weak body's defense system (immune system).  Being older than 88 years old.  Having a blood sugar problem (diabetes).  Having a long-term liver disease (cirrhosis) or kidney disease.  Being very overweight (obese).  Having a skin problem, such as:  An itchy rash.  A rash caused by a fungus.  A rash with blisters.  Slow movement of blood in the veins (venous stasis).  Fluid buildup below the skin (edema).  This condition is more likely to occur in people who:  Have open cuts, burns, bites, or scrapes on the skin.  Have been treated with high-energy rays (radiation).  Use IV drugs.  What are the signs or symptoms?  Skin that:  Looks red or purple, or slightly darker than your usual skin color.  Has streaks.  Has spots.  Is swollen.  Is sore or painful when you touch it.  Is warm.  A fever.  Chills.  Blisters.  Tiredness (fatigue).  How is this treated?  Medicines to treat infections or allergies.  Rest.  Placing cold or warm cloths on the skin.  Staying in the hospital, if the condition is very bad. You may need medicines through an IV.  Follow these instructions at home:  Medicines  Take over-the-counter and prescription medicines only as told by your doctor.  If you were prescribed antibiotics, take them as told by your doctor. Do not stop using them even if you start to feel better.  General instructions  Drink enough fluid to keep your pee (urine) pale yellow.  Do not touch or rub the  infected area.  Raise (elevate) the infected area above the level of your heart while you are sitting or lying down.  Return to your normal activities when your doctor says that it is safe.  Place cold or warm cloths on the area as told by your doctor.  Keep all follow-up visits. Your doctor will need to make sure that a more serious infection is not developing.  Contact a doctor if:  You have a fever.  You do not start to get better after 1-2 days of treatment.  Your bone or joint under the infected area starts to hurt after the skin has healed.  Your infection comes back in the same area or another area. Signs of this may include:  You have a swollen bump in the area.  Your red area gets larger, turns dark in color, or hurts more.  You have more fluid coming from the wound.  Pus or a bad smell develops in your infected area.  You have more pain.  You feel sick and have muscle aches and weakness.  You develop vomiting or watery poop that will not go away.  Get help right away if:  You see red streaks coming from the area.  You notice the skin turns purple or black and falls  off.  These symptoms may be an emergency. Get help right away. Call 911.  Do not wait to see if the symptoms will go away.  Do not drive yourself to the hospital.  This information is not intended to replace advice given to you by your health care provider. Make sure you discuss any questions you have with your health care provider.  Document Revised: 07/17/2022 Document Reviewed: 07/17/2022  Elsevier Patient Education  2024 [employer].

## 2024-04-25 NOTE — Assessment & Plan Note (Signed)
 Cellulitis with swelling and discharge, no pain or itching. Doxycycline  chosen due to adverse reaction to Cefalexin. - Prescribed doxycycline  twice daily with food for 10 days. - Advised against prolonged sun exposure while on doxycycline . - Scheduled follow-up for Tuesday at 2:20 PM to assess antibiotic response.

## 2024-04-25 NOTE — Assessment & Plan Note (Signed)
 Hearing loss with audiologist's recommendation for ear cleaning. - Proceed with ear flushing. -.After obtaining verbal consent, both ears were flushed by irrigation. No TM abnormalities were noted. He tolerated procedure well without any complications.

## 2024-04-25 NOTE — Assessment & Plan Note (Signed)
 Chronic, he is encouraged to stay well hydrated, avoid NSAIDs and keep BP controlled to prevent progression of CKD.   -Continue regular follow up with Nephrology

## 2024-04-28 ENCOUNTER — Encounter: Payer: Self-pay | Admitting: Internal Medicine

## 2024-04-28 DIAGNOSIS — M7660 Achilles tendinitis, unspecified leg: Secondary | ICD-10-CM | POA: Insufficient documentation

## 2024-04-28 NOTE — Assessment & Plan Note (Signed)
 Numbness and aching likely due to carpal tunnel syndrome, uses splint at night. - Advise continued use of wrist splint during sleep. - Consider further evaluation if symptoms persist.

## 2024-04-28 NOTE — Assessment & Plan Note (Signed)
 Pain persists during ambulation, orthotics provided relief but discomfort with prolonged use. - Encourage mobility exercises to enhance circulation. - Consider foot specialist re-evaluation if pain persists.

## 2024-04-28 NOTE — Assessment & Plan Note (Signed)
 Improvement with antibiotics, decreased skin warmth and erythema noted. - Complete full course of antibiotics. - Encourage heel raises to enhance circulation.

## 2024-04-28 NOTE — Assessment & Plan Note (Signed)
 Persistent despite diuretics, limited mobility may contribute. - Encourage heel raises to enhance circulation. - Consider vascular specialist referral if no improvement.

## 2024-04-30 DIAGNOSIS — N281 Cyst of kidney, acquired: Secondary | ICD-10-CM | POA: Diagnosis not present

## 2024-05-05 DIAGNOSIS — M79641 Pain in right hand: Secondary | ICD-10-CM | POA: Diagnosis not present

## 2024-05-06 DIAGNOSIS — G5601 Carpal tunnel syndrome, right upper limb: Secondary | ICD-10-CM | POA: Diagnosis not present

## 2024-05-12 DIAGNOSIS — G5601 Carpal tunnel syndrome, right upper limb: Secondary | ICD-10-CM | POA: Diagnosis not present

## 2024-05-19 DIAGNOSIS — R2689 Other abnormalities of gait and mobility: Secondary | ICD-10-CM | POA: Diagnosis not present

## 2024-05-28 ENCOUNTER — Other Ambulatory Visit: Payer: Self-pay

## 2024-05-28 NOTE — Patient Outreach (Signed)
 Aging Gracefully Program  05/28/2024  KILE KABLER December 24, 1925 [id number]   [facility] Evaluation Interviewer made contact with patient. Aging Gracefully 5 month survey completed.     [facility]  Population Health Care Management Assistant  Direct Dial: [phone]  Fax: [phone] Website: [email or web address]

## 2024-06-08 DIAGNOSIS — N1832 Chronic kidney disease, stage 3b: Secondary | ICD-10-CM | POA: Diagnosis not present

## 2024-06-15 DIAGNOSIS — E785 Hyperlipidemia, unspecified: Secondary | ICD-10-CM | POA: Diagnosis not present

## 2024-06-15 DIAGNOSIS — N2889 Other specified disorders of kidney and ureter: Secondary | ICD-10-CM | POA: Diagnosis not present

## 2024-06-15 DIAGNOSIS — N261 Atrophy of kidney (terminal): Secondary | ICD-10-CM | POA: Diagnosis not present

## 2024-06-15 DIAGNOSIS — N1832 Chronic kidney disease, stage 3b: Secondary | ICD-10-CM | POA: Diagnosis not present

## 2024-06-15 DIAGNOSIS — D631 Anemia in chronic kidney disease: Secondary | ICD-10-CM | POA: Diagnosis not present

## 2024-06-15 DIAGNOSIS — R609 Edema, unspecified: Secondary | ICD-10-CM | POA: Diagnosis not present

## 2024-06-15 DIAGNOSIS — I129 Hypertensive chronic kidney disease with stage 1 through stage 4 chronic kidney disease, or unspecified chronic kidney disease: Secondary | ICD-10-CM | POA: Diagnosis not present

## 2024-06-25 ENCOUNTER — Other Ambulatory Visit: Payer: Self-pay

## 2024-06-25 MED ORDER — TAMSULOSIN HCL 0.4 MG PO CAPS: 0.4000 mg | ORAL_CAPSULE | Freq: Every day | ORAL | 2 refills | Status: AC

## 2024-07-15 DIAGNOSIS — G5601 Carpal tunnel syndrome, right upper limb: Secondary | ICD-10-CM | POA: Diagnosis not present

## 2024-07-15 HISTORY — PX: HAND SURGERY: SHX662

## 2024-08-04 ENCOUNTER — Ambulatory Visit (INDEPENDENT_AMBULATORY_CARE_PROVIDER_SITE_OTHER): Admitting: Internal Medicine

## 2024-08-04 ENCOUNTER — Ambulatory Visit: Payer: Self-pay | Admitting: Internal Medicine

## 2024-08-04 ENCOUNTER — Ambulatory Visit: Payer: Self-pay

## 2024-08-04 ENCOUNTER — Ambulatory Visit
Admission: RE | Admit: 2024-08-04 | Discharge: 2024-08-04 | Disposition: A | Discharge status: Home / Self Care | Source: Ambulatory Visit | Attending: Internal Medicine | Admitting: Internal Medicine

## 2024-08-04 ENCOUNTER — Encounter: Payer: Self-pay | Admitting: Internal Medicine

## 2024-08-04 VITALS — BP 128/68 | HR 80 | Temp 98.1°F | Ht 63.0 in

## 2024-08-04 DIAGNOSIS — R630 Anorexia: Secondary | ICD-10-CM | POA: Diagnosis not present

## 2024-08-04 DIAGNOSIS — R051 Acute cough: Secondary | ICD-10-CM

## 2024-08-04 DIAGNOSIS — S80819A Abrasion, unspecified lower leg, initial encounter: Secondary | ICD-10-CM | POA: Diagnosis not present

## 2024-08-04 DIAGNOSIS — R0602 Shortness of breath: Secondary | ICD-10-CM

## 2024-08-04 DIAGNOSIS — R1310 Dysphagia, unspecified: Secondary | ICD-10-CM

## 2024-08-04 DIAGNOSIS — N1831 Chronic kidney disease, stage 3a: Secondary | ICD-10-CM

## 2024-08-04 DIAGNOSIS — R531 Weakness: Secondary | ICD-10-CM | POA: Diagnosis not present

## 2024-08-04 DIAGNOSIS — R059 Cough, unspecified: Secondary | ICD-10-CM | POA: Diagnosis not present

## 2024-08-04 NOTE — Telephone Encounter (Signed)
 FYI Only or Action Required?: FYI only for provider.  Patient was last seen in primary care on 04/21/2024 by Jarold Medici, MD.  Called Nurse Triage reporting Fatigue.  Symptoms began several days ago.  Interventions attempted: Nothing.  Symptoms are: gradually worsening.  Triage Disposition: See Physician Within 24 Hours  Patient/caregiver understands and will follow disposition?: Yes     Copied from CRM #[id number]. Topic: Clinical - Red Word Triage >> Aug 04, 2024  8:15 AM Myrick T wrote: Red Word that prompted transfer to Nurse Triage: patients friend says patient has been weak with no energy. He has not had an appetite and this has gone on all weekend Reason for Disposition  [1] MODERATE weakness (e.g., interferes with work, school, normal activities) AND [2] persists > 3 days  Answer Assessment - Initial Assessment Questions 1. DESCRIPTION: Describe how you are feeling.     States really weak, no energy, not eating 2. SEVERITY: How bad is it?  Can you stand and walk?     Yes, he can walk 3. ONSET: When did these symptoms begin? (e.g., hours, days, weeks, months)     This weekend 4. CAUSE: What do you think is causing the weakness or fatigue? (e.g., not drinking enough fluids, medical problem, trouble sleeping)     Drinking fine 5. NEW MEDICINES:  Have you started on any new medicines recently? (e.g., opioid pain medicines, benzodiazepines, muscle relaxants, antidepressants, antihistamines, neuroleptics, beta blockers)     no 6. OTHER SYMPTOMS: Do you have any other symptoms? (e.g., chest pain, fever, cough, SOB, vomiting, diarrhea, bleeding, other areas of pain)     denies 7. PREGNANCY: Is there any chance you are pregnant? When was your last menstrual period?     na  Protocols used: Weakness (Generalized) and Fatigue-A-AH

## 2024-08-04 NOTE — Patient Instructions (Signed)
 Fatigue If you have fatigue, you feel tired all the time and have a lack of energy or a lack of motivation. Fatigue may make it difficult to start or complete tasks because of exhaustion. Occasional or mild fatigue is often a normal response to activity or life. However, long-term (chronic) or extreme fatigue may be a symptom of a medical condition such as: Depression. Not having enough red blood cells or hemoglobin in the blood (anemia). A problem with a small gland located in the lower front part of the neck (thyroid disorder). Rheumatologic conditions. These are problems related to the body's defense system (immune system). Infections, especially certain viral infections. Fatigue can also lead to negative health outcomes over time. Follow these instructions at home: Medicines Take over-the-counter and prescription medicines only as told by your health care provider. Take a multivitamin if told by your health care provider. Do not use herbal or dietary supplements unless they are approved by your health care provider. Eating and drinking  Avoid heavy meals in the evening. Eat a well-balanced diet, which includes lean proteins, whole grains, plenty of fruits and vegetables, and low-fat dairy products. Avoid eating or drinking too many products with caffeine in them. Avoid alcohol. Drink enough fluid to keep your urine pale yellow. Activity  Exercise regularly, as told by your health care provider. Use or practice techniques to help you relax, such as yoga, tai chi, meditation, or massage therapy. Lifestyle Change situations that cause you stress. Try to keep your work and personal schedules in balance. Do not use recreational or illegal drugs. General instructions Monitor your fatigue for any changes. Go to bed and get up at the same time every day. Avoid fatigue by pacing yourself during the day and getting enough sleep at night. Maintain a healthy weight. Contact a health care  provider if: Your fatigue does not get better. You have a fever. You suddenly lose or gain weight. You have headaches. You have trouble falling asleep or sleeping through the night. You feel angry, guilty, anxious, or sad. You have swelling in your legs or another part of your body. Get help right away if: You feel confused, feel like you might faint, or faint. Your vision is blurry or you have a severe headache. You have severe pain in your abdomen, your back, or the area between your waist and hips (pelvis). You have chest pain, shortness of breath, or an irregular or fast heartbeat. You are unable to urinate, or you urinate less than normal. You have abnormal bleeding from the rectum, nose, lungs, nipples, or, if you are male, the vagina. You vomit blood. You have thoughts about hurting yourself or others. These symptoms may be an emergency. Get help right away. Call 911. Do not wait to see if the symptoms will go away. Do not drive yourself to the hospital. Get help right away if you feel like you may hurt yourself or others, or have thoughts about taking your own life. Go to your nearest emergency room or: Call 911. Call the National Suicide Prevention Lifeline at (262)721-8699 or 988. This is open 24 hours a day. Text the Crisis Text Line at 8450584327. Summary If you have fatigue, you feel tired all the time and have a lack of energy or a lack of motivation. Fatigue may make it difficult to start or complete tasks because of exhaustion. Long-term (chronic) or extreme fatigue may be a symptom of a medical condition. Exercise regularly, as told by your health care provider.  Change situations that cause you stress. Try to keep your work and personal schedules in balance. This information is not intended to replace advice given to you by your health care provider. Make sure you discuss any questions you have with your health care provider. Document Revised: 09/11/2021 Document  Reviewed: 09/11/2021 Elsevier Patient Education  2024 [employer].

## 2024-08-04 NOTE — Progress Notes (Unsigned)
 I,[name], CMA,acting as a Neurosurgeon for [name], MD.,have documented all relevant documentation on the behalf of [name], MD,as directed by  [name], MD while in the presence of [name], MD.  Subjective:  Patient ID: Wayne [name] , male    DOB: Apr 27, 1926 , 89 y.o.   MRN: [id number]  Chief Complaint  Patient presents with  . Fatigue    Patient presents today for weakness, no appetite along with a  hacking cough when a deep breath is taken. This initially started 2 weeks ago. Denies any fever/ chills.     HPI Discussed the use of AI scribe software for clinical note transcription with the patient, who gave verbal consent to proceed.  History of Present Illness Wayne [name] is a 89 year old male who presents with fatigue, weakness, and decreased appetite. He is accompanied by his daughter, [place].  He has been experiencing significant fatigue and weakness for approximately two weeks, describing himself as 'so weak I can't hardly walk.' He also notes a decreased appetite, stating he is 'eating what I can.'  He describes shortness of breath when taking deep breaths, accompanied by a 'short hacking cough' that began two weeks ago. Previously, taking a deep breath would 'cut my breath off,' but this has improved slightly. He also reports a burning sensation in his chest that started after his hand surgery in August.  Regarding his hand surgery, he underwent a procedure to relieve pressure on a nerve due to stiffness and coldness in his fingers, which affected his ability to pick up objects.  He has a history of esophageal issues, having had his esophagus stretched twice due to it closing up occasionally. He experiences difficulty swallowing, particularly with dry foods, which sometimes leads to choking. His last swallowing test was in 2020, and he has not had significant symptoms since then. He reports his esophagus 'closes up every once in a while,'  and he has to drink liquids to help food pass.  He uses a walker due to equilibrium issues and dizziness. He has been off 'Focus Factor,' a supplement, for about a month after experiencing nervousness.  He mentions a history of kidney issues and is currently taking furosemide, two in the morning and one in the afternoon, and valsartan . He stopped taking a water pill for two weeks to reduce bathroom trips. No difficulty urinating but reports frequent urination.  He has a history of leg swelling, which he manages with a water pill. He notes his legs are less swollen than usual, attributing this to increased time spent sitting and lying down. He also mentions a previous prescription for an antibacterial cream for a leg abrasion, which he is seeking again.   Hypertension This is a chronic problem. The current episode started more than 1 year ago. The problem has been gradually improving since onset. The problem is controlled. Past treatments include calcium  channel blockers, angiotensin blockers, diuretics and lifestyle changes. The current treatment provides moderate improvement. Hypertensive end-organ damage includes kidney disease.     Past Medical History:  Diagnosis Date  . Arthritis   . Bradycardia   . Cataract   . CKD (chronic kidney disease)   . Glaucoma   . History of renal artery stenosis 03/18/2012   right renal PTA/stenting  . Hyperlipidemia   . Hypertension   . Renal cyst, right      Family History  Problem Relation Age of Onset  . Dementia Mother   .  Cancer Father      Current Outpatient Medications:  .  amLODipine  (NORVASC ) 5 MG tablet, TAKE 1 TABLET BY MOUTH DAILY, Disp: 70 tablet, Rfl: 4 .  aspirin  EC 81 MG tablet, Take 81 mg by mouth daily., Disp: , Rfl:  .  atorvastatin  (LIPITOR) 20 MG tablet, TAKE M,W,F ONLY., Disp: 90 tablet, Rfl: 2 .  doxycycline  (VIBRA -TABS) 100 MG tablet, Take 1 tablet (100 mg total) by mouth 2 (two) times daily., Disp: 20 tablet, Rfl: 0 .   Emu Oil OIL, by Does not apply route., Disp: , Rfl:  .  furosemide (LASIX) 40 MG tablet, Take 40 mg by mouth 2 (two) times daily. Take 2 tabs in the morning; and 1 tab in the evening, Disp: , Rfl:  .  gabapentin  (NEURONTIN ) 300 MG capsule, TAKE 1 CAPSULE BY MOUTH TWICE  DAILY, Disp: 200 capsule, Rfl: 2 .  lidocaine  (LIDODERM ) 5 %, Place 1 patch onto the skin daily. Remove & Discard patch within 12 hours or as directed by MD, Disp: 30 patch, Rfl: 0 .  Multiple Vitamin (MULTIVITAMIN WITH MINERALS) TABS tablet, Take 1 tablet by mouth daily. 50+, Disp: , Rfl:  .  Omega-3 Fatty Acids (FISH OIL) 1200 MG CAPS, Take 1 capsule by mouth daily., Disp: , Rfl:  .  oxyCODONE-acetaminophen  (PERCOCET) 7.5-325 MG tablet, Take 1-2 tablets by mouth daily as needed for severe pain (pain score 7-10)., Disp: , Rfl:  .  senna (SENOKOT) 8.6 MG tablet, Take 1 tablet by mouth daily., Disp: , Rfl:  .  tamsulosin  (FLOMAX ) 0.4 MG CAPS capsule, Take 0.4 mg by mouth in the morning and at bedtime., Disp: , Rfl:  .  tamsulosin  (FLOMAX ) 0.4 MG CAPS capsule, Take 1 capsule (0.4 mg total) by mouth daily., Disp: 100 capsule, Rfl: 2 .  timolol  (TIMOPTIC ) 0.5 % ophthalmic solution, Place 1 drop into both eyes daily., Disp: , Rfl:  .  furosemide (LASIX) 40 MG tablet, Take 40 mg by mouth daily. (Patient not taking: Reported on 08/04/2024), Disp: , Rfl:  .  OVER THE COUNTER MEDICATION, Take 1 tablet by mouth daily. Focus factor (Patient not taking: Reported on 08/04/2024), Disp: , Rfl:  .  OVER THE COUNTER MEDICATION, Apply 1 application  topically daily as needed (pain). Veterinary cream (Patient not taking: Reported on 08/04/2024), Disp: , Rfl:  .  Saw Palmetto 450 MG CAPS, Take 1 capsule by mouth daily. (Patient not taking: Reported on 08/04/2024), Disp: , Rfl:  .  traMADol  (ULTRAM ) 50 MG tablet, Take 1 tablet (50 mg total) by mouth every 6 (six) hours as needed for moderate pain (pain score 4-6) or severe pain (pain score 7-10). (Patient not  taking: Reported on 08/04/2024), Disp: 20 tablet, Rfl: 0 .  trolamine salicylate (BLUE-EMU MAXIMUM PAIN RELIEF) 10 % cream, Apply 1 Application topically daily at 12 noon. (Patient not taking: Reported on 08/04/2024), Disp: , Rfl:  .  valsartan  (DIOVAN ) 80 MG tablet, Take 80 mg by mouth daily. (Patient not taking: Reported on 08/04/2024), Disp: , Rfl:    Allergies  Allergen Reactions  . Cephalexin     Severe mouth and throat dryness.     Review of Systems  Constitutional: Negative.   Respiratory: Negative.    Gastrointestinal: Negative.   Skin: Negative.   Allergic/Immunologic: Negative.   Hematological: Negative.      Today's Vitals   08/04/24 1410  BP: 128/68  Pulse: 80  Temp: 98.1 F (36.7 C)  SpO2: 98%  Height: 5'  3 (1.6 m)   Body mass index is 27.32 kg/m.  Wt Readings from Last 3 Encounters:  04/21/24 154 lb 3.2 oz (69.9 kg)  04/16/24 153 lb 12.8 oz (69.8 kg)  02/05/24 153 lb (69.4 kg)     Objective:  Physical Exam Vitals and nursing note reviewed.  Constitutional:      Appearance: Normal appearance.  HENT:     Head: Normocephalic and atraumatic.  Eyes:     Extraocular Movements: Extraocular movements intact.  Cardiovascular:     Rate and Rhythm: Normal rate and regular rhythm.     Heart sounds: Normal heart sounds.  Pulmonary:     Effort: Pulmonary effort is normal.     Breath sounds: Normal breath sounds.  Musculoskeletal:     Cervical back: Normal range of motion.     Right lower leg: Edema present.     Left lower leg: Edema present.  Skin:    General: Skin is warm.  Neurological:     General: No focal deficit present.     Mental Status: He is alert.  Psychiatric:        Mood and Affect: Mood normal.         Assessment And Plan:  Weakness   Assessment & Plan Weakness and anorexia Weakness and decreased appetite possibly due to decreased oral intake or anemia. Concerns about potential blood clots due to decreased activity. - Order blood work  for kidney function, blood count, and electrolytes. - Encourage oral intake as tolerated. - Evaluate for blood clots if symptoms persist.  Acute cough and shortness of breath Acute cough and shortness of breath possibly related to recent hand surgery. Differential includes aspiration, infection, or heart failure. Lungs clear on examination. Shortness of breath at rest and when cold, not when lying flat. - Order chest x-ray for aspiration or pulmonary issues. - Consider echocardiogram if heart failure suspected.  Dysphagia with history of esophageal dilation Recurrent dysphagia with difficulty swallowing, especially dry foods. Previous esophageal dilation by Dr. Rollin. Last swallow test in 2020. - Refer to Dr. Rollin for evaluation and possible esophageal dilation. - Order swallow test to assess aspiration risk.  Lower extremity skin lesion (abrasion) Abrasion on lower extremity. Currently draining, requires topical treatment. Previous use of bacitracin effective. - Provide bacitracin samples for topical application. - Instruct on wound care: apply bacitracin twice daily, cover with Band-Aid during the day, leave open at night.  Chronic kidney disease Chronic kidney disease with recent medication changes. Concerns about kidney function due to decreased oral intake and possible dehydration. Stopped Farxiga for two weeks, currently on valsartan  and furosemide. - Order blood work to assess kidney function. - Review medication regimen with nephrologist if needed. Return if symptoms worsen or fail to improve.  Patient was given opportunity to ask questions. Patient verbalized understanding of the plan and was able to repeat key elements of the plan. All questions were answered to their satisfaction.   I, [name], MD, have reviewed all documentation for this visit. The documentation on 08/04/24 for the exam, diagnosis, procedures, and orders are all accurate and complete.  IF YOU HAVE BEEN  REFERRED TO A SPECIALIST, IT MAY TAKE 1-2 WEEKS TO SCHEDULE/PROCESS THE REFERRAL. IF YOU HAVE NOT HEARD FROM US /SPECIALIST IN TWO WEEKS, PLEASE GIVE US  A CALL AT [phone] X [phone].   THE PATIENT IS ENCOURAGED TO PRACTICE SOCIAL DISTANCING DUE TO THE COVID-19 PANDEMIC.

## 2024-08-05 ENCOUNTER — Other Ambulatory Visit: Payer: Self-pay | Admitting: Internal Medicine

## 2024-08-05 DIAGNOSIS — S80819A Abrasion, unspecified lower leg, initial encounter: Secondary | ICD-10-CM | POA: Insufficient documentation

## 2024-08-05 DIAGNOSIS — R748 Abnormal levels of other serum enzymes: Secondary | ICD-10-CM

## 2024-08-05 DIAGNOSIS — R1310 Dysphagia, unspecified: Secondary | ICD-10-CM | POA: Insufficient documentation

## 2024-08-05 DIAGNOSIS — R0602 Shortness of breath: Secondary | ICD-10-CM | POA: Insufficient documentation

## 2024-08-05 LAB — CBC WITH DIFFERENTIAL/PLATELET
Basophils Absolute: 0 x10E3/uL (ref 0.0–0.2)
Basos: 1 %
EOS (ABSOLUTE): 0 x10E3/uL (ref 0.0–0.4)
Eos: 0 %
Hematocrit: 36.9 % — ABNORMAL LOW (ref 37.5–51.0)
Hemoglobin: 12.7 g/dL — ABNORMAL LOW (ref 13.0–17.7)
Immature Grans (Abs): 0 x10E3/uL (ref 0.0–0.1)
Immature Granulocytes: 0 %
Lymphocytes Absolute: 2.4 x10E3/uL (ref 0.7–3.1)
Lymphs: 47 %
MCH: 34.4 pg — ABNORMAL HIGH (ref 26.6–33.0)
MCHC: 34.4 g/dL (ref 31.5–35.7)
MCV: 100 fL — ABNORMAL HIGH (ref 79–97)
Monocytes Absolute: 0.5 x10E3/uL (ref 0.1–0.9)
Monocytes: 9 %
Neutrophils Absolute: 2.2 x10E3/uL (ref 1.4–7.0)
Neutrophils: 43 %
Platelets: 206 x10E3/uL (ref 150–450)
RBC: 3.69 x10E6/uL — ABNORMAL LOW (ref 4.14–5.80)
RDW: 13.8 % (ref 11.6–15.4)
WBC: 5.2 x10E3/uL (ref 3.4–10.8)

## 2024-08-05 LAB — CMP14+EGFR
ALT: 125 IU/L — ABNORMAL HIGH (ref 0–44)
AST: 104 IU/L — ABNORMAL HIGH (ref 0–40)
Albumin: 4 g/dL (ref 3.6–4.6)
Alkaline Phosphatase: 117 IU/L (ref 44–121)
BUN/Creatinine Ratio: 29 — ABNORMAL HIGH (ref 10–24)
BUN: 60 mg/dL — ABNORMAL HIGH (ref 10–36)
Bilirubin Total: 0.6 mg/dL (ref 0.0–1.2)
CO2: 21 mmol/L (ref 20–29)
Calcium: 9.8 mg/dL (ref 8.6–10.2)
Chloride: 104 mmol/L (ref 96–106)
Creatinine, Ser: 2.05 mg/dL — ABNORMAL HIGH (ref 0.76–1.27)
Globulin, Total: 3.3 g/dL (ref 1.5–4.5)
Glucose: 92 mg/dL (ref 70–99)
Potassium: 4.5 mmol/L (ref 3.5–5.2)
Sodium: 143 mmol/L (ref 134–144)
Total Protein: 7.3 g/dL (ref 6.0–8.5)
eGFR: 29 mL/min/1.73 — ABNORMAL LOW (ref >59)

## 2024-08-05 LAB — BRAIN NATRIURETIC PEPTIDE: BNP: 109.9 pg/mL — ABNORMAL HIGH (ref 0.0–100.0)

## 2024-08-05 LAB — TSH: TSH: 0.872 u[IU]/mL (ref 0.450–4.500)

## 2024-08-05 LAB — D-DIMER, QUANTITATIVE: D-DIMER: 2.53 mg{FEU}/L — ABNORMAL HIGH (ref 0.00–0.49)

## 2024-08-05 NOTE — Assessment & Plan Note (Signed)
 Weakness and decreased appetite possibly due to decreased oral intake or anemia. Concerns about potential blood clots due to decreased activity. - Order blood work for kidney function, blood count, and electrolytes. - Encourage oral intake as tolerated. - Evaluate for blood clots if symptoms persist.

## 2024-08-05 NOTE — Assessment & Plan Note (Signed)
 Recurrent dysphagia with difficulty swallowing, especially dry foods. Previous esophageal dilation by Dr. Rollin. Last swallow test in 2020. - Refer to Dr. Rollin for evaluation and possible esophageal dilation. - Order swallow test to assess aspiration risk.

## 2024-08-05 NOTE — Assessment & Plan Note (Signed)
 Abrasion on lower extremity. Currently draining, requires topical treatment. Previous use of bacitracin effective. - Provide bacitracin samples for topical application. - Instruct on wound care: apply bacitracin twice daily, cover with Band-Aid during the day, leave open at night.

## 2024-08-05 NOTE — Assessment & Plan Note (Signed)
 Pulse ox is 98%. Acute cough and shortness of breath possibly related to recent hand surgery. Differential includes aspiration, infection, or heart failure. Lungs clear on examination. Shortness of breath at rest and when cold, not when lying flat. - Order chest x-ray for aspiration or pulmonary issues. - Consider echocardiogram if heart failure suspected

## 2024-08-06 ENCOUNTER — Other Ambulatory Visit: Payer: Self-pay | Admitting: Internal Medicine

## 2024-08-06 LAB — POC COVID19 BINAXNOW: SARS Coronavirus 2 Ag: NEGATIVE

## 2024-08-06 NOTE — Assessment & Plan Note (Signed)
 Chronic kidney disease with recent medication changes. Concerns about kidney function due to decreased oral intake and possible dehydration. Stopped Farxiga for two weeks, currently on valsartan  and furosemide. - Order blood work to assess kidney function. - Review medication regimen with nephrologist if needed.

## 2024-08-07 ENCOUNTER — Encounter (HOSPITAL_BASED_OUTPATIENT_CLINIC_OR_DEPARTMENT_OTHER): Payer: Self-pay | Admitting: Emergency Medicine

## 2024-08-07 ENCOUNTER — Observation Stay (HOSPITAL_BASED_OUTPATIENT_CLINIC_OR_DEPARTMENT_OTHER)
Admission: EM | Admit: 2024-08-07 | Discharge: 2024-08-11 | Disposition: A | Discharge status: Skilled Nursing Facility | Source: Ambulatory Visit | Attending: Emergency Medicine | Admitting: Emergency Medicine

## 2024-08-07 ENCOUNTER — Other Ambulatory Visit: Payer: Self-pay

## 2024-08-07 ENCOUNTER — Emergency Department (HOSPITAL_BASED_OUTPATIENT_CLINIC_OR_DEPARTMENT_OTHER)

## 2024-08-07 ENCOUNTER — Telehealth: Payer: Self-pay | Admitting: Internal Medicine

## 2024-08-07 DIAGNOSIS — R7989 Other specified abnormal findings of blood chemistry: Secondary | ICD-10-CM | POA: Diagnosis not present

## 2024-08-07 DIAGNOSIS — K760 Fatty (change of) liver, not elsewhere classified: Secondary | ICD-10-CM | POA: Diagnosis not present

## 2024-08-07 DIAGNOSIS — R7401 Elevation of levels of liver transaminase levels: Secondary | ICD-10-CM | POA: Diagnosis not present

## 2024-08-07 DIAGNOSIS — R531 Weakness: Secondary | ICD-10-CM | POA: Diagnosis not present

## 2024-08-07 DIAGNOSIS — R791 Abnormal coagulation profile: Secondary | ICD-10-CM | POA: Insufficient documentation

## 2024-08-07 DIAGNOSIS — D631 Anemia in chronic kidney disease: Secondary | ICD-10-CM | POA: Diagnosis present

## 2024-08-07 DIAGNOSIS — R5381 Other malaise: Secondary | ICD-10-CM | POA: Insufficient documentation

## 2024-08-07 DIAGNOSIS — N1832 Chronic kidney disease, stage 3b: Secondary | ICD-10-CM | POA: Diagnosis not present

## 2024-08-07 DIAGNOSIS — R262 Difficulty in walking, not elsewhere classified: Secondary | ICD-10-CM | POA: Diagnosis not present

## 2024-08-07 DIAGNOSIS — Z7982 Long term (current) use of aspirin: Secondary | ICD-10-CM | POA: Diagnosis not present

## 2024-08-07 DIAGNOSIS — I129 Hypertensive chronic kidney disease with stage 1 through stage 4 chronic kidney disease, or unspecified chronic kidney disease: Secondary | ICD-10-CM | POA: Insufficient documentation

## 2024-08-07 DIAGNOSIS — N183 Chronic kidney disease, stage 3 unspecified: Secondary | ICD-10-CM | POA: Diagnosis present

## 2024-08-07 DIAGNOSIS — Z23 Encounter for immunization: Secondary | ICD-10-CM | POA: Insufficient documentation

## 2024-08-07 DIAGNOSIS — R0602 Shortness of breath: Principal | ICD-10-CM | POA: Insufficient documentation

## 2024-08-07 DIAGNOSIS — R001 Bradycardia, unspecified: Secondary | ICD-10-CM | POA: Diagnosis not present

## 2024-08-07 DIAGNOSIS — Z79899 Other long term (current) drug therapy: Secondary | ICD-10-CM | POA: Insufficient documentation

## 2024-08-07 DIAGNOSIS — Z743 Need for continuous supervision: Secondary | ICD-10-CM | POA: Diagnosis not present

## 2024-08-07 DIAGNOSIS — I10 Essential (primary) hypertension: Secondary | ICD-10-CM | POA: Diagnosis present

## 2024-08-07 DIAGNOSIS — N179 Acute kidney failure, unspecified: Secondary | ICD-10-CM | POA: Diagnosis not present

## 2024-08-07 DIAGNOSIS — I2510 Atherosclerotic heart disease of native coronary artery without angina pectoris: Secondary | ICD-10-CM | POA: Diagnosis present

## 2024-08-07 DIAGNOSIS — R609 Edema, unspecified: Secondary | ICD-10-CM | POA: Diagnosis not present

## 2024-08-07 DIAGNOSIS — E889 Metabolic disorder, unspecified: Secondary | ICD-10-CM | POA: Diagnosis not present

## 2024-08-07 LAB — COMPREHENSIVE METABOLIC PANEL WITH GFR
ALT: 80 U/L — ABNORMAL HIGH (ref 0–44)
AST: 53 U/L — ABNORMAL HIGH (ref 15–41)
Albumin: 3.8 g/dL (ref 3.5–5.0)
Alkaline Phosphatase: 94 U/L (ref 38–126)
Anion gap: 16 — ABNORMAL HIGH (ref 5–15)
BUN: 65 mg/dL — ABNORMAL HIGH (ref 8–23)
CO2: 22 mmol/L (ref 22–32)
Calcium: 9.7 mg/dL (ref 8.9–10.3)
Chloride: 103 mmol/L (ref 98–111)
Creatinine, Ser: 2.3 mg/dL — ABNORMAL HIGH (ref 0.61–1.24)
GFR, Estimated: 26 mL/min — ABNORMAL LOW (ref >60)
Glucose, Bld: 98 mg/dL (ref 70–99)
Potassium: 4 mmol/L (ref 3.5–5.1)
Sodium: 140 mmol/L (ref 135–145)
Total Bilirubin: 0.4 mg/dL (ref 0.0–1.2)
Total Protein: 7.1 g/dL (ref 6.5–8.1)

## 2024-08-07 LAB — PRO BRAIN NATRIURETIC PEPTIDE: Pro Brain Natriuretic Peptide: 484 pg/mL — ABNORMAL HIGH (ref <300.0)

## 2024-08-07 LAB — RESP PANEL BY RT-PCR (RSV, FLU A&B, COVID)  RVPGX2
Influenza A by PCR: NEGATIVE
Influenza B by PCR: NEGATIVE
Resp Syncytial Virus by PCR: NEGATIVE
SARS Coronavirus 2 by RT PCR: NEGATIVE

## 2024-08-07 LAB — URINALYSIS, ROUTINE W REFLEX MICROSCOPIC
Bilirubin Urine: NEGATIVE
Glucose, UA: NEGATIVE mg/dL
Hgb urine dipstick: NEGATIVE
Ketones, ur: NEGATIVE mg/dL
Leukocytes,Ua: NEGATIVE
Nitrite: NEGATIVE
Protein, ur: NEGATIVE mg/dL
Specific Gravity, Urine: 1.011 (ref 1.005–1.030)
pH: 5 (ref 5.0–8.0)

## 2024-08-07 LAB — CBC WITH DIFFERENTIAL/PLATELET
Abs Immature Granulocytes: 0.01 K/uL (ref 0.00–0.07)
Basophils Absolute: 0 K/uL (ref 0.0–0.1)
Basophils Relative: 0 %
Eosinophils Absolute: 0 K/uL (ref 0.0–0.5)
Eosinophils Relative: 0 %
HCT: 34 % — ABNORMAL LOW (ref 39.0–52.0)
Hemoglobin: 11.9 g/dL — ABNORMAL LOW (ref 13.0–17.0)
Immature Granulocytes: 0 %
Lymphocytes Relative: 32 %
Lymphs Abs: 1.9 K/uL (ref 0.7–4.0)
MCH: 34.5 pg — ABNORMAL HIGH (ref 26.0–34.0)
MCHC: 35 g/dL (ref 30.0–36.0)
MCV: 98.6 fL (ref 80.0–100.0)
Monocytes Absolute: 0.5 K/uL (ref 0.1–1.0)
Monocytes Relative: 9 %
Neutro Abs: 3.4 K/uL (ref 1.7–7.7)
Neutrophils Relative %: 59 %
Platelets: 208 K/uL (ref 150–400)
RBC: 3.45 MIL/uL — ABNORMAL LOW (ref 4.22–5.81)
RDW: 13.9 % (ref 11.5–15.5)
WBC: 5.8 K/uL (ref 4.0–10.5)
nRBC: 0 % (ref 0.0–0.2)

## 2024-08-07 LAB — LIPASE, BLOOD: Lipase: 24 U/L (ref 11–51)

## 2024-08-07 MED ORDER — SODIUM CHLORIDE 0.9 % IV BOLUS
500.0000 mL | Freq: Once | INTRAVENOUS | Status: AC
  Administered 2024-08-07: 500 mL via INTRAVENOUS

## 2024-08-07 NOTE — Telephone Encounter (Signed)
 Late entry: Spoke with patient's family yesterday evening and he is no longer sob, feels well and eating better. Advised to continue to hold Tylenol /atorvastatin  due to elevated LFTs.  Will check echo due to elevated BNP (CHF vs. CKD).  Pt is in agreement.  Pt advised to go to ER if his sx recur.  RS

## 2024-08-07 NOTE — ED Triage Notes (Signed)
 Sob, weakness no appetite  Had labs with PCP elevated liver, elevated d-dimer, elevated bnp per patient

## 2024-08-07 NOTE — ED Notes (Addendum)
 Called [facility] per charge nurse to transport the patient to [facility]. [facility] down to 2 trucks

## 2024-08-07 NOTE — Progress Notes (Signed)
 Patient arrived to [facility] room 9 from [facility] notified to obtain admission orders at [id number]

## 2024-08-07 NOTE — ED Provider Notes (Signed)
 [facility] AT [facility] Provider Note   CSN: [id number] Arrival date & time: 08/07/24  1232     Patient presents with: Shortness of Breath   Wayne [name] is a 89 y.o. male.   The history is provided by the patient, medical records and a relative. No language interpreter was used.  Shortness of Breath    89 year old male history of CAD, CKD, hypertension, hyperlipidemia presenting for evaluation of generalized fatigue. Patient endorses generalized fatigue and weakness ongoing for the past 2 to 3 weeks to the point that he is having difficulty ambulating.  He also report he does not have much of an appetite.  He also complaining of having some shortness of breath when he taking deep breath as well as occasional cough.  Patient also previously had underwent right hand surgery last month for his carpal tunnel syndrome.  He did start taking his Lasix  2 weeks ago because he does not want to get up at nighttime to urinate.  He mentions he just does not have much of an appetite and overall weak and tired.  He went to see his PCP several days ago for his complaint.  He was notified that his labs are abnormal which includes elevated D-dimer elevated liver enzyme as well as elevated BNP.  He was instructed to come to the ER for further assessment.  His doctor was concerning for potential blood clot.  Prior to Admission medications   Medication Sig Start Date End Date Taking? Authorizing Provider  amLODipine  (NORVASC ) 5 MG tablet TAKE 1 TABLET BY MOUTH DAILY 03/12/24   Jarold Medici, MD  aspirin  EC 81 MG tablet Take 81 mg by mouth daily.    [provider]  atorvastatin  (LIPITOR) 20 MG tablet TAKE M,W,F ONLY. 09/12/23   Jarold Medici, MD  doxycycline  (VIBRA -TABS) 100 MG tablet Take 1 tablet (100 mg total) by mouth 2 (two) times daily. 04/16/24   Jarold Medici, MD  Emu Oil OIL by Does not apply route.    [provider]  furosemide  (LASIX ) 40 MG  tablet Take 40 mg by mouth daily. Patient not taking: Reported on 08/04/2024 01/05/22   [provider]  furosemide  (LASIX ) 40 MG tablet Take 40 mg by mouth 2 (two) times daily. Take 2 tabs in the morning; and 1 tab in the evening    [provider]  gabapentin  (NEURONTIN ) 300 MG capsule TAKE 1 CAPSULE BY MOUTH TWICE  DAILY 01/30/24   Jarold Medici, MD  lidocaine  (LIDODERM ) 5 % Place 1 patch onto the skin daily. Remove & Discard patch within 12 hours or as directed by MD 03/07/23   Jarold Medici, MD  Multiple Vitamin (MULTIVITAMIN WITH MINERALS) TABS tablet Take 1 tablet by mouth daily. 50+    [provider]  Omega-3 Fatty Acids (FISH OIL) 1200 MG CAPS Take 1 capsule by mouth daily.    [provider]  OVER THE COUNTER MEDICATION Take 1 tablet by mouth daily. Focus factor Patient not taking: Reported on 08/04/2024    [provider]  OVER THE COUNTER MEDICATION Apply 1 application  topically daily as needed (pain). Veterinary cream Patient not taking: Reported on 08/04/2024    [provider]  oxyCODONE -acetaminophen  (PERCOCET) 7.5-325 MG tablet Take 1-2 tablets by mouth daily as needed for severe pain (pain score 7-10). 08/27/23   [provider]  Saw Palmetto 450 MG CAPS Take 1 capsule by mouth daily. Patient not taking: Reported on 08/04/2024  [provider]  senna (SENOKOT) 8.6 MG tablet Take 1 tablet by mouth daily.    [provider]  tamsulosin  (FLOMAX ) 0.4 MG CAPS capsule Take 0.4 mg by mouth in the morning and at bedtime.    [provider]  tamsulosin  (FLOMAX ) 0.4 MG CAPS capsule Take 1 capsule (0.4 mg total) by mouth daily. 06/25/24   Jarold Medici, MD  timolol  (TIMOPTIC ) 0.5 % ophthalmic solution Place 1 drop into both eyes daily. 08/19/23   [provider]  traMADol  (ULTRAM ) 50 MG tablet Take 1 tablet (50 mg total) by mouth every 6 (six) hours as needed for moderate pain (pain score 4-6) or severe  pain (pain score 7-10). Patient not taking: Reported on 08/04/2024 09/24/23   Vernetta Berg, MD  trolamine salicylate (BLUE-EMU MAXIMUM PAIN RELIEF) 10 % cream Apply 1 Application topically daily at 12 noon. Patient not taking: Reported on 08/04/2024    [provider]  valsartan  (DIOVAN ) 80 MG tablet Take 80 mg by mouth daily. Patient not taking: Reported on 08/04/2024    [provider]    Allergies: Cephalexin    Review of Systems  Respiratory:  Positive for shortness of breath.   All other systems reviewed and are negative.   Updated Vital Signs BP 111/76 (BP Location: Right Arm)   Pulse 62   Temp 98.3 F (36.8 C)   Resp 20   SpO2 99%   Physical Exam Constitutional:      General: He is not in acute distress.    Appearance: He is well-developed.  HENT:     Head: Atraumatic.  Eyes:     Conjunctiva/sclera: Conjunctivae normal.  Neck:     Comments: No JVD Cardiovascular:     Rate and Rhythm: Normal rate and regular rhythm.  Pulmonary:     Effort: Pulmonary effort is normal.     Breath sounds: Normal breath sounds. No wheezing, rhonchi or rales.  Abdominal:     Palpations: Abdomen is soft.     Tenderness: There is no abdominal tenderness.  Musculoskeletal:     Cervical back: Normal range of motion and neck supple.     Right lower leg: No edema.     Left lower leg: No edema.     Comments: Equal strength to all 4 extremities  Skin:    Findings: No rash.  Neurological:     Mental Status: He is alert.     (all labs ordered are listed, but only abnormal results are displayed) Labs Reviewed  CBC WITH DIFFERENTIAL/PLATELET - Abnormal; Notable for the following components:      Result Value   RBC 3.45 (*)    Hemoglobin 11.9 (*)    HCT 34.0 (*)    MCH 34.5 (*)    All other components within normal limits  COMPREHENSIVE METABOLIC PANEL WITH GFR - Abnormal; Notable for the following components:   BUN 65 (*)    Creatinine, Ser 2.30 (*)    AST 53 (*)     ALT 80 (*)    GFR, Estimated 26 (*)    Anion gap 16 (*)    All other components within normal limits  PRO BRAIN NATRIURETIC PEPTIDE - Abnormal; Notable for the following components:   Pro Brain Natriuretic Peptide 484.0 (*)    All other components within normal limits  RESP PANEL BY RT-PCR (RSV, FLU A&B, COVID)  RVPGX2  LIPASE, BLOOD  URINALYSIS, ROUTINE W REFLEX MICROSCOPIC    EKG: None ED ECG REPORT  Date: 08/07/2024  Rate: 54  Rhythm: sinus bradycardia  QRS Axis: left  Intervals: normal  ST/T Wave abnormalities: normal  Conduction Disutrbances:right bundle branch block and left anterior fascicular block  Narrative Interpretation:   Old EKG Reviewed: unchanged  I have personally reviewed the EKG tracing and agree with the computerized printout as noted.   Radiology: DG Chest Portable 1 View Result Date: 08/07/2024 CLINICAL DATA:  Short of breath, weakness EXAM: PORTABLE CHEST 1 VIEW COMPARISON:  08/04/2024 FINDINGS: Single frontal view of the chest demonstrates a stable cardiac silhouette. Stable volume loss in the left hemithorax and left pleural calcifications unchanged. No acute airspace disease, effusion, or pneumothorax. No acute bony abnormalities. IMPRESSION: 1. Stable chest, no acute process. Electronically Signed   By: Ozell Daring M.D.   On: 08/07/2024 16:54     Procedures   Medications Ordered in the ED  sodium chloride  0.9 % bolus 500 mL (500 mLs Intravenous New Bag/Given 08/07/24 1511)                                    Medical Decision Making Amount and/or Complexity of Data Reviewed Labs: ordered. Radiology: ordered. ECG/medicine tests: ordered.   BP 111/76 (BP Location: Right Arm)   Pulse 62   Temp 98.3 F (36.8 C)   Resp 20   SpO2 99%   20:48 PM  89 year old male history of CAD, CKD, hypertension, hyperlipidemia presenting for evaluation of generalized fatigue. Patient endorses generalized fatigue and weakness ongoing for the past 2 to  3 weeks to the point that he is having difficulty ambulating.  He also report he does not have much of an appetite.  He also complaining of having some shortness of breath when he taking deep breath as well as occasional cough.  Patient also previously had underwent right hand surgery last month for his carpal tunnel syndrome.  He did start taking his Lasix  2 weeks ago because he does not want to get up at nighttime to urinate.  He mentions he just does not have much of an appetite and overall weak and tired.  He went to see his PCP several days ago for his complaint.  He was notified that his labs are abnormal which includes elevated D-dimer elevated liver enzyme as well as elevated BNP.  He was instructed to come to the ER for further assessment.  His doctor was concerning for potential blood clot.  On exam patient is resting comfortably appears to be in no acute discomfort.  He does not have any significant signs of CHF exacerbation as there are no JVD lungs are clear.  No significant peripheral edema noted.  Had recent carpal tunnel surgery on his right wrist with a well-healing wound.  EMR reviewed patient's labs was obtained 3 days prior shows a slightly elevated BNP of 109, his D-dimer is elevated at 2.53 therefore he need a PE study to rule out blood clot but unfortunately his renal function is impaired with a creatinine of 2.05 with a GFR of 29.  Will recheck labs.  -Labs ordered, independently viewed and interpreted by me.  Labs remarkable for urinalysis without signs of UTI, normal WBC, hemoglobin is 11.9, some evidence of worsening renal function with BUN 65, creatinine of 2.3, IV fluid given.  Mild transaminitis with AST 53, ALT 80.  Anion gap of 16 likely starvation ketosis.  Given though proBNP is 484 age-adjusted is within  normal limit. -The patient was maintained on a cardiac monitor.  I personally viewed and interpreted the cardiac monitored which showed an underlying rhythm of: Sinus  bradycardia, history of sinus bradycardia in the past -Imaging independently viewed and interpreted by me and I agree with radiologist's interpretation.  Result remarkable for chest x-ray unremarkable -This patient presents to the ED for concern of fatigue, this involves an extensive number of treatment options, and is a complaint that carries with it a high risk of complications and morbidity.  The differential diagnosis includes failure to thrive, dehydration, hepatorenal syndrome, symptomatic bradycardia, PE, -Co morbidities that complicate the patient evaluation includes CAD, CKD, hypertension -Treatment includes IV fluid -Reevaluation of the patient after these medicines showed that the patient stayed the same -PCP office notes or outside notes reviewed -Discussion with specialist [facility] hospitalist Dr. Noralee who agrees to admit patient -Escalation to admission/observation considered: patient is agreeable with admission  Patient will need VQ scan to rule out PE.      Final diagnoses:  AKI (acute kidney injury) (HCC)  Bradycardia  Elevated d-dimer    ED Discharge Orders     None          Nivia Colon, PA-C 08/07/24 1714    Mannie Pac T, DO 08/11/24 [phone]

## 2024-08-07 NOTE — ED Notes (Addendum)
 Called [facility] to transport the patient to [facility] rm# [id number]

## 2024-08-07 NOTE — ED Notes (Signed)
 Family and patient updated with bed number. PO fluids and crackers given. Pt stable at this time. No respiratory distress.

## 2024-08-08 ENCOUNTER — Observation Stay (HOSPITAL_BASED_OUTPATIENT_CLINIC_OR_DEPARTMENT_OTHER)

## 2024-08-08 ENCOUNTER — Observation Stay (HOSPITAL_COMMUNITY)

## 2024-08-08 DIAGNOSIS — R5381 Other malaise: Secondary | ICD-10-CM

## 2024-08-08 DIAGNOSIS — N179 Acute kidney failure, unspecified: Secondary | ICD-10-CM | POA: Diagnosis not present

## 2024-08-08 DIAGNOSIS — R0602 Shortness of breath: Secondary | ICD-10-CM

## 2024-08-08 DIAGNOSIS — R7401 Elevation of levels of liver transaminase levels: Secondary | ICD-10-CM | POA: Insufficient documentation

## 2024-08-08 DIAGNOSIS — E860 Dehydration: Secondary | ICD-10-CM | POA: Diagnosis not present

## 2024-08-08 DIAGNOSIS — K838 Other specified diseases of biliary tract: Secondary | ICD-10-CM | POA: Diagnosis not present

## 2024-08-08 DIAGNOSIS — R7989 Other specified abnormal findings of blood chemistry: Secondary | ICD-10-CM | POA: Diagnosis not present

## 2024-08-08 DIAGNOSIS — K760 Fatty (change of) liver, not elsewhere classified: Secondary | ICD-10-CM | POA: Diagnosis not present

## 2024-08-08 LAB — HEPATIC FUNCTION PANEL
ALT: 56 U/L — ABNORMAL HIGH (ref 0–44)
AST: 38 U/L (ref 15–41)
Albumin: 2.8 g/dL — ABNORMAL LOW (ref 3.5–5.0)
Alkaline Phosphatase: 89 U/L (ref 38–126)
Bilirubin, Direct: 0.1 mg/dL (ref 0.0–0.2)
Indirect Bilirubin: 0.6 mg/dL (ref 0.3–0.9)
Total Bilirubin: 0.7 mg/dL (ref 0.0–1.2)
Total Protein: 6.6 g/dL (ref 6.5–8.1)

## 2024-08-08 LAB — ECHOCARDIOGRAM COMPLETE
Area-P 1/2: 2.05 cm2
Height: 63.5 in
S' Lateral: 2.7 cm
Weight: 2176.38 [oz_av]

## 2024-08-08 LAB — CK: Total CK: 139 U/L (ref 49–397)

## 2024-08-08 LAB — LACTIC ACID, PLASMA: Lactic Acid, Venous: 1.1 mmol/L (ref 0.5–1.9)

## 2024-08-08 LAB — CBC
HCT: 33.7 % — ABNORMAL LOW (ref 39.0–52.0)
Hemoglobin: 11.1 g/dL — ABNORMAL LOW (ref 13.0–17.0)
MCH: 33.4 pg (ref 26.0–34.0)
MCHC: 32.9 g/dL (ref 30.0–36.0)
MCV: 101.5 fL — ABNORMAL HIGH (ref 80.0–100.0)
Platelets: 221 K/uL (ref 150–400)
RBC: 3.32 MIL/uL — ABNORMAL LOW (ref 4.22–5.81)
RDW: 14.1 % (ref 11.5–15.5)
WBC: 5.5 K/uL (ref 4.0–10.5)
nRBC: 0 % (ref 0.0–0.2)

## 2024-08-08 LAB — BASIC METABOLIC PANEL WITH GFR
Anion gap: 8 (ref 5–15)
BUN: 63 mg/dL — ABNORMAL HIGH (ref 8–23)
CO2: 28 mmol/L (ref 22–32)
Calcium: 8.8 mg/dL — ABNORMAL LOW (ref 8.9–10.3)
Chloride: 106 mmol/L (ref 98–111)
Creatinine, Ser: 2.05 mg/dL — ABNORMAL HIGH (ref 0.61–1.24)
GFR, Estimated: 29 mL/min — ABNORMAL LOW (ref >60)
Glucose, Bld: 95 mg/dL (ref 70–99)
Potassium: 3.9 mmol/L (ref 3.5–5.1)
Sodium: 142 mmol/L (ref 135–145)

## 2024-08-08 LAB — TROPONIN I (HIGH SENSITIVITY)
Troponin I (High Sensitivity): 19 ng/L — ABNORMAL HIGH (ref <18)
Troponin I (High Sensitivity): 20 ng/L — ABNORMAL HIGH (ref <18)

## 2024-08-08 LAB — PHOSPHORUS: Phosphorus: 4 mg/dL (ref 2.5–4.6)

## 2024-08-08 LAB — MAGNESIUM: Magnesium: 1.9 mg/dL (ref 1.7–2.4)

## 2024-08-08 MED ORDER — ASPIRIN 81 MG PO TBEC
81.0000 mg | DELAYED_RELEASE_TABLET | Freq: Every day | ORAL | Status: DC
Start: 2024-08-08 — End: 2024-08-11
  Administered 2024-08-08 – 2024-08-11 (×4): 81 mg via ORAL
  Filled 2024-08-08 (×4): qty 1

## 2024-08-08 MED ORDER — INFLUENZA VAC SPLIT HIGH-DOSE 0.5 ML IM SUSY
0.5000 mL | PREFILLED_SYRINGE | INTRAMUSCULAR | Status: DC
  Filled 2024-08-08 (×2): qty 0.5

## 2024-08-08 MED ORDER — MELATONIN 3 MG PO TABS: 6.0000 mg | ORAL_TABLET | Freq: Every evening | ORAL | Status: DC | PRN

## 2024-08-08 MED ORDER — SODIUM CHLORIDE 0.9% FLUSH
3.0000 mL | Freq: Two times a day (BID) | INTRAVENOUS | Status: DC
  Administered 2024-08-08 – 2024-08-11 (×8): 3 mL via INTRAVENOUS

## 2024-08-08 MED ORDER — AMLODIPINE BESYLATE 5 MG PO TABS
5.0000 mg | ORAL_TABLET | Freq: Every day | ORAL | Status: DC
  Administered 2024-08-08 – 2024-08-11 (×4): 5 mg via ORAL
  Filled 2024-08-08 (×5): qty 1

## 2024-08-08 MED ORDER — ONDANSETRON HCL 4 MG/2ML IJ SOLN: 4.0000 mg | Freq: Four times a day (QID) | INTRAMUSCULAR | Status: DC | PRN

## 2024-08-08 MED ORDER — ALBUTEROL SULFATE (2.5 MG/3ML) 0.083% IN NEBU: 2.5000 mg | INHALATION_SOLUTION | RESPIRATORY_TRACT | Status: DC | PRN

## 2024-08-08 MED ORDER — HEPARIN SODIUM (PORCINE) 5000 UNIT/ML IJ SOLN
5000.0000 [IU] | Freq: Three times a day (TID) | INTRAMUSCULAR | Status: DC
  Administered 2024-08-08 – 2024-08-11 (×11): 5000 [IU] via SUBCUTANEOUS
  Filled 2024-08-08 (×11): qty 1

## 2024-08-08 MED ORDER — OXYCODONE HCL 5 MG PO TABS: 7.5000 mg | ORAL_TABLET | Freq: Two times a day (BID) | ORAL | Status: DC | PRN

## 2024-08-08 MED ORDER — POLYETHYLENE GLYCOL 3350 17 G PO PACK: 17.0000 g | PACK | Freq: Every day | ORAL | Status: DC | PRN

## 2024-08-08 MED ORDER — ENSURE PLUS HIGH PROTEIN PO LIQD
237.0000 mL | Freq: Two times a day (BID) | ORAL | Status: DC
  Administered 2024-08-08 – 2024-08-11 (×7): 237 mL via ORAL

## 2024-08-08 MED ORDER — SODIUM CHLORIDE 0.9 % IV BOLUS
500.0000 mL | Freq: Once | INTRAVENOUS | Status: AC
  Administered 2024-08-08: 500 mL via INTRAVENOUS

## 2024-08-08 MED ORDER — ATORVASTATIN CALCIUM 10 MG PO TABS
20.0000 mg | ORAL_TABLET | Freq: Every day | ORAL | Status: DC
  Administered 2024-08-08 – 2024-08-11 (×4): 20 mg via ORAL
  Filled 2024-08-08 (×4): qty 2

## 2024-08-08 MED ORDER — OXYCODONE-ACETAMINOPHEN 7.5-325 MG PO TABS: 1.0000 | ORAL_TABLET | Freq: Two times a day (BID) | ORAL | Status: DC | PRN

## 2024-08-08 MED ORDER — ACETAMINOPHEN 500 MG PO TABS
1000.0000 mg | ORAL_TABLET | Freq: Four times a day (QID) | ORAL | Status: DC | PRN
Start: 2024-08-08 — End: 2024-08-08

## 2024-08-08 MED ORDER — ACETAMINOPHEN 500 MG PO TABS: 1000.0000 mg | ORAL_TABLET | Freq: Four times a day (QID) | ORAL | Status: DC | PRN

## 2024-08-08 MED ORDER — ACETAMINOPHEN 500 MG PO TABS: 500.0000 mg | ORAL_TABLET | Freq: Four times a day (QID) | ORAL | Status: DC | PRN

## 2024-08-08 MED ORDER — TAMSULOSIN HCL 0.4 MG PO CAPS
0.4000 mg | ORAL_CAPSULE | Freq: Every day | ORAL | Status: DC
  Administered 2024-08-08 – 2024-08-10 (×3): 0.4 mg via ORAL
  Filled 2024-08-08 (×3): qty 1

## 2024-08-08 MED ORDER — GABAPENTIN 300 MG PO CAPS
300.0000 mg | ORAL_CAPSULE | Freq: Every day | ORAL | Status: DC
  Administered 2024-08-08 – 2024-08-10 (×3): 300 mg via ORAL
  Filled 2024-08-08 (×3): qty 1

## 2024-08-08 MED ORDER — TIMOLOL MALEATE 0.5 % OP SOLN
1.0000 [drp] | Freq: Every day | OPHTHALMIC | Status: DC
  Administered 2024-08-08 – 2024-08-11 (×4): 1 [drp] via OPHTHALMIC
  Filled 2024-08-08: qty 5

## 2024-08-08 NOTE — Assessment & Plan Note (Signed)
 Sinus brady, old RBBB TSH normal UA COVID normal CXR clear - Check trop, echo - Obtain VQ

## 2024-08-08 NOTE — H&P (Signed)
 History and Physical    Wayne [name] FMW:[id number] DOB: December 07, 1925 DOA: 08/07/2024  PCP: Jarold Medici, MD   Patient coming from: Tx from [facility] ED    Chief Complaint:  Chief Complaint  Patient presents with   Shortness of Breath    HPI:  Wayne [name] is a 89 y.o. male with hx of CAD with PCI, CKD 3b, hypertension, dysphagia requiring esophageal dilation, recent carpal tunnel release, who was recently seen by PCP for weakness, SOB and had Ddimer done which was positive and referred to ED, transferred from [facility] ED for VQ scan to r/o PE I/s/o renal insufficiency.  Reports that over past few weeks has had decreased appetite, takes a few bites then stops. Denies dysphagia / globus. No Nausea, vomiting, abdominal pain. Having some constipation. He feels dehydrated. Has been progressively more weak related to not eating / drinking. Still taking his lasix . Edema has improved which he attributes to more time laying in bed rather than being up and around.   Otherwise notes intermittent SOB although none now. His LE is symmetric. No associated chest pain. No hx of VTE in the past.     Review of Systems:  ROS complete and negative except as marked above   Allergies  Allergen Reactions   Cephalexin     Severe mouth and throat dryness.    Prior to Admission medications   Medication Sig Start Date End Date Taking? Authorizing Provider  amLODipine  (NORVASC ) 5 MG tablet TAKE 1 TABLET BY MOUTH DAILY 03/12/24   Jarold Medici, MD  aspirin  EC 81 MG tablet Take 81 mg by mouth daily.    [provider]  atorvastatin  (LIPITOR) 20 MG tablet TAKE M,W,F ONLY. 09/12/23   Jarold Medici, MD  doxycycline  (VIBRA -TABS) 100 MG tablet Take 1 tablet (100 mg total) by mouth 2 (two) times daily. 04/16/24   Jarold Medici, MD  Emu Oil OIL by Does not apply route.    [provider]  furosemide  (LASIX ) 40 MG tablet Take 40 mg by mouth 2 (two) times daily. Take 2 tabs in the morning; and 1  tab in the evening    [provider]  gabapentin  (NEURONTIN ) 300 MG capsule TAKE 1 CAPSULE BY MOUTH TWICE  DAILY 01/30/24   Jarold Medici, MD  lidocaine  (LIDODERM ) 5 % Place 1 patch onto the skin daily. Remove & Discard patch within 12 hours or as directed by MD 03/07/23   Jarold Medici, MD  Multiple Vitamin (MULTIVITAMIN WITH MINERALS) TABS tablet Take 1 tablet by mouth daily. 50+    [provider]  Omega-3 Fatty Acids (FISH OIL) 1200 MG CAPS Take 1 capsule by mouth daily.    [provider]  oxyCODONE -acetaminophen  (PERCOCET) 7.5-325 MG tablet Take 1-2 tablets by mouth daily as needed for severe pain (pain score 7-10). 08/27/23   [provider]  senna (SENOKOT) 8.6 MG tablet Take 1 tablet by mouth daily.    [provider]  tamsulosin  (FLOMAX ) 0.4 MG CAPS capsule Take 0.4 mg by mouth in the morning and at bedtime.    [provider]  tamsulosin  (FLOMAX ) 0.4 MG CAPS capsule Take 1 capsule (0.4 mg total) by mouth daily. 06/25/24   Jarold Medici, MD  timolol  (TIMOPTIC ) 0.5 % ophthalmic solution Place 1 drop into both eyes daily. 08/19/23   [provider]  traMADol  (ULTRAM ) 50 MG tablet Take 1 tablet (50 mg total) by mouth every 6 (six) hours as needed for moderate pain (pain score  4-6) or severe pain (pain score 7-10). Patient not taking: Reported on 08/04/2024 09/24/23   Vernetta Berg, MD    Past Medical History:  Diagnosis Date   Arthritis    Bradycardia    Cataract    CKD (chronic kidney disease)    Glaucoma    History of renal artery stenosis 03/18/2012   right renal PTA/stenting   Hyperlipidemia    Hypertension    Renal cyst, right     Past Surgical History:  Procedure Laterality Date   APPENDECTOMY     BACK SURGERY     FOOT SURGERY     HAND SURGERY Right 07/15/2024   INGUINAL HERNIA REPAIR Left 09/24/2023   Procedure: LEFT HERNIA REPAIR INGUINAL ADULT WITH MESH;  Surgeon: Vernetta Berg, MD;  Location: [facility] OR;   Service: General;  Laterality: Left;  CHOICE/TAP BLOCK   JOINT REPLACEMENT Left    knee   kidney stent Left 2015   KNEE SURGERY Left x 2    left ankle     LEFT HEART CATHETERIZATION WITH CORONARY ANGIOGRAM N/A 03/18/2012   Procedure: LEFT HEART CATHETERIZATION WITH CORONARY ANGIOGRAM;  Surgeon: Erick JONELLE Bergamo, MD;  Location: [facility] CATH LAB;  Service: Cardiovascular;  Laterality: N/A;   RENAL ANGIOGRAM N/A 03/18/2012   Procedure: RENAL ANGIOGRAM;  Surgeon: Erick JONELLE Bergamo, MD;  Location: [facility] CATH LAB;  Service: Cardiovascular;  Laterality: N/A;   right wrist     SPINE SURGERY       reports that he quit smoking about 52 years ago. His smoking use included cigarettes. He started smoking about 55 years ago. He has a 0.8 pack-year smoking history. He has never used smokeless tobacco. He reports that he does not currently use alcohol. He reports current drug use. Drug: Oxycodone .  Family History  Problem Relation Age of Onset   Dementia Mother    Cancer Father      Physical Exam: Vitals:   08/07/24 2241 08/07/24 2245 08/07/24 2328 08/08/24 0009  BP:  102/60 (!) 111/56 (!) 111/56  Pulse:  70 (!) 51 (!) 51  Resp:  14 16 16   Temp: 98.2 F (36.8 C)  97.9 F (36.6 C) 97.9 F (36.6 C)  TempSrc: Oral  Oral Oral  SpO2:  98% 100%   Weight:    61 kg  Height:    5' 3.5 (1.613 m)    Gen: Awake, alert, NAD, elderly, relatively robust for age   CV: Regular, normal S1, S2, 1/6 diastolic decresc murmur  Resp: Normal WOB, CTAB  Abd: Flat, normoactive, nontender MSK: Symmetric, 1-2+ edema near the ankles.  Skin: No rashes or lesions to exposed skin  Neuro: Alert and interactive  Psych: euthymic, appropriate    Data review:   Labs reviewed, notable for:   Bicarb 22, AG 16  Cr 2.3 BUN 65  Pro BNP 484  WBC 5  Ddimer 2.53 outpatient.  TSH wnl.  UA neg   Micro:  Results for orders placed or performed during the hospital encounter of 08/07/24  Resp panel by RT-PCR (RSV, Flu A&B,  Covid) Anterior Nasal Swab     Status: None   Collection Time: 08/07/24  5:20 PM   Specimen: Anterior Nasal Swab  Result Value Ref Range Status   SARS Coronavirus 2 by RT PCR NEGATIVE NEGATIVE Final    Comment: (NOTE) SARS-CoV-2 target nucleic acids are NOT DETECTED.  The SARS-CoV-2 RNA is generally detectable in upper respiratory specimens during the acute phase of infection. The  lowest concentration of SARS-CoV-2 viral copies this assay can detect is 138 copies/mL. A negative result does not preclude SARS-Cov-2 infection and should not be used as the sole basis for treatment or other patient management decisions. A negative result may occur with  improper specimen collection/handling, submission of specimen other than nasopharyngeal swab, presence of viral mutation(s) within the areas targeted by this assay, and inadequate number of viral copies(<138 copies/mL). A negative result must be combined with clinical observations, patient history, and epidemiological information. The expected result is Negative.  Fact Sheet for Patients:  BloggerCourse.com  Fact Sheet for Healthcare Providers:  SeriousBroker.it  This test is no t yet approved or cleared by the [place]  FDA and  has been authorized for detection and/or diagnosis of SARS-CoV-2 by FDA under an Emergency Use Authorization (EUA). This EUA will remain  in effect (meaning this test can be used) for the duration of the COVID-19 declaration under Section 564(b)(1) of the Act, 21 U.S.C.section 360bbb-3(b)(1), unless the authorization is terminated  or revoked sooner.       Influenza A by PCR NEGATIVE NEGATIVE Final   Influenza B by PCR NEGATIVE NEGATIVE Final    Comment: (NOTE) The Xpert Xpress SARS-CoV-2/FLU/RSV plus assay is intended as an aid in the diagnosis of influenza from Nasopharyngeal swab specimens and should not be used as a sole basis for treatment. Nasal  washings and aspirates are unacceptable for Xpert Xpress SARS-CoV-2/FLU/RSV testing.  Fact Sheet for Patients: BloggerCourse.com  Fact Sheet for Healthcare Providers: SeriousBroker.it  This test is not yet approved or cleared by the [place]  FDA and has been authorized for detection and/or diagnosis of SARS-CoV-2 by FDA under an Emergency Use Authorization (EUA). This EUA will remain in effect (meaning this test can be used) for the duration of the COVID-19 declaration under Section 564(b)(1) of the Act, 21 U.S.C. section 360bbb-3(b)(1), unless the authorization is terminated or revoked.     Resp Syncytial Virus by PCR NEGATIVE NEGATIVE Final    Comment: (NOTE) Fact Sheet for Patients: BloggerCourse.com  Fact Sheet for Healthcare Providers: SeriousBroker.it  This test is not yet approved or cleared by the [place]  FDA and has been authorized for detection and/or diagnosis of SARS-CoV-2 by FDA under an Emergency Use Authorization (EUA). This EUA will remain in effect (meaning this test can be used) for the duration of the COVID-19 declaration under Section 564(b)(1) of the Act, 21 U.S.C. section 360bbb-3(b)(1), unless the authorization is terminated or revoked.  Performed at [facility], [address], [place], [place] [address]     Imaging reviewed:  DG Chest Portable 1 View Result Date: 08/07/2024 CLINICAL DATA:  Short of breath, weakness EXAM: PORTABLE CHEST 1 VIEW COMPARISON:  08/04/2024 FINDINGS: Single frontal view of the chest demonstrates a stable cardiac silhouette. Stable volume loss in the left hemithorax and left pleural calcifications unchanged. No acute airspace disease, effusion, or pneumothorax. No acute bony abnormalities. IMPRESSION: 1. Stable chest, no acute process. Electronically Signed   By: Ozell Daring M.D.   On: 08/07/2024  16:54    EKG:  Reviewed, sinus bradycardia with RBBB, no acute ischemic changes   ED Course:   Treated with 500 cc IVF    Assessment/Plan:  89 y.o. male with hx CAD with PCI, CKD 3b, hypertension, dysphagia requiring esophageal dilation, recent carpal tunnel release, who was recently seen by PCP for weakness, SOB and had Ddimer done which was positive and referred to ED, transferred from [facility] ED for  VQ scan to r/o PE I/s/o renal insufficiency.   + Ddimer, r/o PE  Limited symptoms attributable to VTE, intermittent SOB. Chronic edema unchanged and improved  -- VQ scan ordered  -- Hold off AC for now   AKI stage I  CKD 3b  Baseline Cr 1.6 - 1.8. Up to 2.3 on admission. Suspect prerenal with poor intake.  -- s/p 500 cc IVF, give additional 500 cc then continue oral hydration  -- Check PVR   Weakness / poor PO intake  By history attributes to poor appetite, denies any dysphagia. Likely poor intake and dehydration driving symptoms.  -- supportive care, add ensure to trays. Discussed possibly starting on appetite stimulant like Mirtazapine which he is open to. I will let him think about this and maybe discuss with family if they are very involved in care.  -- Think ok without GI / esophagram given limited symptoms, follow tolerance for diet. OK for regular diet for now as tolerates.  -- PT / OT evaluation   Chronic medical problems:  CAD with hx PCI '13: Continue home aspirin   HTN: Continue home amlodipine   HLD: continue home Atorvastatin .  LE edema: Hold home lasix .  BPH: Continue home tamsulosin  Chronic pain: Reduce gabapentin  to 300 mg at bedtime (home is BID), and continue home Percocet 7.5 mg BID prn.  Glaucoma: continue home gtts  Recent carpal tunnel release: Noted    Body mass index is 23.45 kg/m.    DVT prophylaxis:  SQ Heparin  Code Status:  DNR/DNI(Do NOT Intubate); confirmed with patient  Diet:  Diet Orders (From admission, onward)     Start     Ordered    08/08/24 0019  Diet regular Room service appropriate? Yes; Fluid consistency: Thin  Diet effective now       Question Answer Comment  Room service appropriate? Yes   Fluid consistency: Thin      08/08/24 0019           Family Communication:  None   Consults:  None   Admission status:   Observation, Med-Surg  Severity of Illness: The appropriate patient status for this patient is OBSERVATION. Observation status is judged to be reasonable and necessary in order to provide the required intensity of service to ensure the patient's safety. The patient's presenting symptoms, physical exam findings, and initial radiographic and laboratory data in the context of their medical condition is felt to place them at decreased risk for further clinical deterioration. Furthermore, it is anticipated that the patient will be medically stable for discharge from the hospital within 2 midnights of admission.    Dorn Dawson, MD [facility]  How to contact the [facility] Attending or Consulting provider 7A - 7P or covering provider during after hours 7P -7A, for this patient.  Check the care team in [facility] and look for a) attending/consulting [facility] provider listed and b) the [facility] team listed Log into [email or web address] and use [place]'s universal password to access. If you do not have the password, please contact the hospital operator. Locate the [facility] provider you are looking for under [facility] and page to a number that you can be directly reached. If you still have difficulty reaching the provider, please page the [facility] (Director on Call) for the Hospitalists listed on amion for assistance.  08/08/2024, 3:34 AM

## 2024-08-08 NOTE — Evaluation (Signed)
 Physical Therapy Evaluation Patient Details Name: Wayne [name] MRN: [id number] DOB: May 11, 1926 Today's Date: 08/08/2024  History of Present Illness  Patient is 89 yo male admitted on 08/07/24 for fatigue and generalized weakness. PMH significant for hx of CAD with PCI, CKD 3b, HTN, dysphagia requiring esophageal dilation, recent carpal tunnel release (07/15/24).  Clinical Impression  Therapy confirmed with MD prior to participation, concerns about possible PE. MD stated as long as vitals remained WNL, therapy able to complete evaluation. PTA, patient lived alone in one level home with 4 steps to enter. Modified independent with use of walker and independent with ADLs. Patient presents today with generalized LE weakness and mild instability with functional transfers and gait. Currently completes sit to stand transfers with CGA and ambulation short distances with CGA and 2WW. Denied reports of any shortness of breath with activity. Unable to get pulse oximeter to read patient's finger during session. Patient will have limited assistance upon discharge. Recommend at this time post-acute rehab < 3 hours/day to return to PLOF. However, if family is able to assist patient initially, may be able to progress home with increased assist/supervision.       If plan is discharge home, recommend the following: A little help with walking and/or transfers;Supervision due to cognitive status;A little help with bathing/dressing/bathroom;Help with stairs or ramp for entrance;Assist for transportation;Assistance with cooking/housework   Can travel by private vehicle   Yes    Equipment Recommendations None recommended by PT;Other (comment) (Patient has necessary equipment)  Recommendations for Other Services       Functional Status Assessment Patient has had a recent decline in their functional status and demonstrates the ability to make significant improvements in function in a reasonable and predictable amount  of time.     Precautions / Restrictions Precautions Precautions: Fall Recall of Precautions/Restrictions: Intact Restrictions Weight Bearing Restrictions Per Provider Order: No      Mobility  Bed Mobility               General bed mobility comments: Patient standing with CNA upon entry.    Transfers Overall transfer level: Needs assistance Equipment used: Rolling walker (2 wheels) Transfers: Sit to/from Stand Sit to Stand: Contact guard assist           General transfer comment: Use of grab bars to sit <> stand to commode, increased time    Ambulation/Gait Ambulation/Gait assistance: Contact guard assist Gait Distance (Feet): 35 Feet (35 feet x1 and 10 feet x1) Assistive device: Rolling walker (2 wheels) Gait Pattern/deviations: Decreased stride length, Knee flexed in stance - left, Shuffle       General Gait Details: bilateral foot pronation, poor foot clearance  Stairs            Wheelchair Mobility     Tilt Bed    Modified Rankin (Stroke Patients Only)       Balance Overall balance assessment: Needs assistance Sitting-balance support: No upper extremity supported, Feet supported Sitting balance-Leahy Scale: Good Sitting balance - Comments: seated on commode without UE support, SBA   Standing balance support: Bilateral upper extremity supported Standing balance-Leahy Scale: Fair Standing balance comment: Patient able to maintain static standing balance without UE support and CGA. Benefits from use of 2WW with dynamic movement. Standing at sink using counter for support at hips.                             Pertinent Vitals/Pain Pain  Assessment Pain Assessment: No/denies pain    Home Living Family/patient expects to be discharged to:: Private residence Living Arrangements: Alone   Type of Home: House Home Access: Stairs to enter Entrance Stairs-Rails: Doctor, general practice of Steps: 4 (back entrance) which  patient reports using the most   Home Layout: One level Home Equipment: Agricultural consultant (2 wheels);Rollator (4 wheels);Grab bars - toilet Additional Comments: Raised toilet    Prior Function Prior Level of Function : Independent/Modified Independent             Mobility Comments: Patient reports independent ambulator at baseline with use of 2WW vs 4WW. (+) driving and taking himself to appointments. Drive thru pharmacy. Denies falls in the past month but increased difficulty since onset of weakness. Reports daughter lives in [place]  but is currently here to help out as needed. Son lives in [place] but has Parkinson's and recently underwent back surgery.       Extremity/Trunk Assessment   Upper Extremity Assessment Upper Extremity Assessment: Defer to OT evaluation    Lower Extremity Assessment Lower Extremity Assessment: Generalized weakness (deficits in L knee ROM)    Cervical / Trunk Assessment Cervical / Trunk Assessment: Kyphotic  Communication   Communication Communication: No apparent difficulties Factors Affecting Communication: Hearing impaired    Cognition Arousal: Alert Behavior During Therapy: WFL for tasks assessed/performed   PT - Cognitive impairments: No apparent impairments                         Following commands: Intact       Cueing Cueing Techniques: Verbal cues     General Comments      Exercises     Assessment/Plan    PT Assessment Patient needs continued PT services  PT Problem List Decreased strength;Decreased range of motion;Decreased knowledge of use of DME;Decreased activity tolerance;Decreased safety awareness;Decreased balance;Decreased knowledge of precautions;Decreased mobility;Impaired sensation;Cardiopulmonary status limiting activity       PT Treatment Interventions DME instruction;Gait training;Stair training;Functional mobility training;Therapeutic activities;Therapeutic exercise;Balance  training;Neuromuscular re-education;Patient/family education;Wheelchair mobility training    PT Goals (Current goals can be found in the Care Plan section)  Acute Rehab PT Goals Patient Stated Goal: Patient's goal is to get stronger to return home independently. PT Goal Formulation: With patient Time For Goal Achievement: 08/22/24 Potential to Achieve Goals: Good    Frequency Min 2X/week     Co-evaluation PT/OT/SLP Co-Evaluation/Treatment: Yes Reason for Co-Treatment: Complexity of the patient's impairments (multi-system involvement);For patient/therapist safety PT goals addressed during session: Mobility/safety with mobility OT goals addressed during session: ADL's and self-care       AM-PAC PT 6 Clicks Mobility  Outcome Measure Help needed turning from your back to your side while in a flat bed without using bedrails?: A Little Help needed moving from lying on your back to sitting on the side of a flat bed without using bedrails?: A Little Help needed moving to and from a bed to a chair (including a wheelchair)?: A Little Help needed standing up from a chair using your arms (e.g., wheelchair or bedside chair)?: A Little Help needed to walk in hospital room?: A Little Help needed climbing 3-5 steps with a railing? : A Lot 6 Click Score: 17    End of Session Equipment Utilized During Treatment: Gait belt Activity Tolerance: Patient tolerated treatment well Patient left: in chair;with call bell/phone within reach;with chair alarm set Nurse Communication: Mobility status PT Visit Diagnosis: Unsteadiness on feet (R26.81);Other abnormalities of  gait and mobility (R26.89);Muscle weakness (generalized) (M62.81);Difficulty in walking, not elsewhere classified (R26.2)    Time: 8968-8897 PT Time Calculation (min) (ACUTE ONLY): 31 min   Charges:   PT Evaluation $PT Eval Low Complexity: 1 Low PT Treatments $Therapeutic Activity: 8-22 mins PT General Charges $$ ACUTE PT VISIT: 1  Visit         Sherryle Glendora, PT, DPT [facility] Acute Rehabilitation Office: [phone]   Sherryle VEAR Panola 08/08/2024, 1:47 PM

## 2024-08-08 NOTE — Progress Notes (Signed)
  Echocardiogram 2D Echocardiogram has been performed.  Wayne [name], RDCS 08/08/2024, 3:29 PM

## 2024-08-08 NOTE — Progress Notes (Signed)
 9/6 Patient unable to communicate due to sleepiness, I spoke to Heron Leaks, daughter-in-law telephonically [phone] for verbal consent.

## 2024-08-08 NOTE — Care Management Obs Status (Signed)
 MEDICARE OBSERVATION STATUS NOTIFICATION   Patient Details  Name: Wayne [name] MRN: [id number] Date of Birth: Mar 14, 1926   Medicare Observation Status Notification Given:  Yes    Jon Cruel 08/08/2024, 2:30 PM

## 2024-08-08 NOTE — Evaluation (Signed)
 Occupational Therapy Evaluation Patient Details Name: Wayne [name] MRN: [id number] DOB: 1926-04-25 Today's Date: 08/08/2024   History of Present Illness   Patient is 89 yo male admitted on 08/07/24 for fatigue and generalized weakness. PMH significant for hx of CAD with PCI, CKD 3b, HTN, dysphagia requiring esophageal dilation, recent carpal tunnel release (07/15/24).     Clinical Impressions Pt reported at PLOF they live alone but able to drive but was having some assist with meals as reporting losing their balance. Per pt reported they have a son that lives near by they had recent back sx and daughter visiting from [place] . He was not sure on how long she will be around with the assist to home. Pt at this time completed UE ADLs post set up and LE CGA to min assist. Patient will benefit from continued inpatient follow up therapy, <3 hours/day but if daughter can assist with the return to home may be able to dc with HH.      If plan is discharge home, recommend the following:   A little help with walking and/or transfers;A little help with bathing/dressing/bathroom;Assistance with cooking/housework;Assist for transportation     Functional Status Assessment   Patient has had a recent decline in their functional status and demonstrates the ability to make significant improvements in function in a reasonable and predictable amount of time.     Equipment Recommendations    (TBD at next venue)     Recommendations for Other Services         Precautions/Restrictions   Precautions Precautions: Fall Recall of Precautions/Restrictions: Intact Restrictions Weight Bearing Restrictions Per Provider Order: No MD cleared due to r/o PE     Mobility Bed Mobility               General bed mobility comments: Patient standing with CNA upon entry.    Transfers Overall transfer level: Needs assistance Equipment used: Rolling walker (2 wheels) Transfers: Sit to/from  Stand Sit to Stand: Contact guard assist           General transfer comment: Use of grab bars to sit <> stand to commode, increased time      Balance Overall balance assessment: Needs assistance Sitting-balance support: No upper extremity supported, Feet supported Sitting balance-Leahy Scale: Good Sitting balance - Comments: seated on commode without UE support, SBA   Standing balance support: Bilateral upper extremity supported Standing balance-Leahy Scale: Fair Standing balance comment: Patient able to maintain static standing balance without UE support and CGA. Benefits from use of 2WW with dynamic movement. Standing at sink using counter for support at hips.                           ADL either performed or assessed with clinical judgement   ADL Overall ADL's : Needs assistance/impaired Eating/Feeding: Independent;Sitting   Grooming: Wash/dry hands;Wash/dry face;Contact guard assist;Standing   Upper Body Bathing: Set up;Sitting   Lower Body Bathing: Contact guard assist;Minimal assistance   Upper Body Dressing : Set up;Sitting   Lower Body Dressing: Contact guard assist;Minimal assistance   Toilet Transfer: Contact guard assist;BSC/3in1;Rolling walker (2 wheels);Grab bars   Toileting- Clothing Manipulation and Hygiene: Contact guard assist;Sit to/from stand       Functional mobility during ADLs: Contact guard assist;Rolling walker (2 wheels)       Vision Baseline Vision/History: 1 Wears glasses (no glasses present)       Perception  Praxis         Pertinent Vitals/Pain Pain Assessment Pain Assessment: No/denies pain     Extremity/Trunk Assessment Upper Extremity Assessment Upper Extremity Assessment:  (recent carpal tunnel sx with limited extention in 5th digit, reporting numbness is improving)   Lower Extremity Assessment Lower Extremity Assessment: Generalized weakness   Cervical / Trunk Assessment Cervical / Trunk  Assessment: Kyphotic   Communication Communication Communication: No apparent difficulties Factors Affecting Communication: Hearing impaired   Cognition Arousal: Alert Behavior During Therapy: WFL for tasks assessed/performed                                 Following commands: Intact       Cueing  General Comments   Cueing Techniques: Verbal cues      Exercises     Shoulder Instructions      Home Living Family/patient expects to be discharged to:: Private residence Living Arrangements: Alone Available Help at Discharge: Available PRN/intermittently Type of Home: House Home Access: Stairs to enter [employer] of Steps: 4 (back entrance) which patient reports using the most Entrance Stairs-Rails: Right;Left Home Layout: One level     Bathroom Shower/Tub: Chief Strategy Officer: Standard     Home Equipment: Agricultural consultant (2 wheels);Rollator (4 wheels);Grab bars - toilet   Additional Comments: Raised toilet      Prior Functioning/Environment Prior Level of Function : Independent/Modified Independent             Mobility Comments: Patient reports independent ambulator at baseline with use of 2WW vs 4WW. (+) driving and taking himself to appointments. Drive thru pharmacy. Denies falls in the past month but increased difficulty since onset of weakness. Reports daughter lives in [place]  but is currently here to help out as needed. Son lives in [place] but has Parkinson's and recently underwent back surgery.      OT Problem List: Decreased strength;Decreased activity tolerance;Impaired balance (sitting and/or standing);Decreased knowledge of use of DME or AE;Decreased safety awareness   OT Treatment/Interventions: Self-care/ADL training;Therapeutic exercise;DME and/or AE instruction;Therapeutic activities;Patient/family education;Balance training      OT Goals(Current goals can be found in the care plan section)   Acute  Rehab OT Goals Patient Stated Goal: to get stronger OT Goal Formulation: With patient Time For Goal Achievement: 08/22/24 Potential to Achieve Goals: Good   OT Frequency:  Min 2X/week    Co-evaluation PT/OT/SLP Co-Evaluation/Treatment: Yes Reason for Co-Treatment: Complexity of the patient's impairments (multi-system involvement);For patient/therapist safety PT goals addressed during session: Mobility/safety with mobility OT goals addressed during session: ADL's and self-care      AM-PAC OT 6 Clicks Daily Activity     Outcome Measure Help from another person eating meals?: None Help from another person taking care of personal grooming?: A Little Help from another person toileting, which includes using toliet, bedpan, or urinal?: A Little Help from another person bathing (including washing, rinsing, drying)?: A Little Help from another person to put on and taking off regular upper body clothing?: None Help from another person to put on and taking off regular lower body clothing?: A Little 6 Click Score: 20   End of Session Equipment Utilized During Treatment: Gait belt;Rolling walker (2 wheels) Nurse Communication: Mobility status  Activity Tolerance: Patient tolerated treatment well Patient left: in chair;with call bell/phone within reach;with chair alarm set  OT Visit Diagnosis: Unsteadiness on feet (R26.81);Other abnormalities of gait and mobility (R26.89);Repeated falls (  R29.6);Muscle weakness (generalized) (M62.81)                Time: 8968-8897 OT Time Calculation (min): 31 min Charges:  OT General Charges $OT Visit: 1 Visit OT Evaluation $OT Eval Low Complexity: 1 Low  Warrick POUR OTR/L  Acute Rehab Services  [phone] office number   Warrick Berber 08/08/2024, 2:00 PM

## 2024-08-08 NOTE — Assessment & Plan Note (Addendum)
 CK nl US  abdomen

## 2024-08-08 NOTE — Hospital Course (Signed)
 89 y.o. M with HTN, CKD IIIb baseline >1.7, hx esophageal stenosis, and CAD s/p PCI 2013 who presented with vague subacute malaise, SOB.

## 2024-08-08 NOTE — Plan of Care (Signed)
  Problem: Clinical Measurements: Goal: Ability to maintain clinical measurements within normal limits will improve Outcome: Not Progressing Goal: Diagnostic test results will improve Outcome: Not Progressing   Problem: Activity: Goal: Risk for activity intolerance will decrease Outcome: Not Progressing

## 2024-08-08 NOTE — Progress Notes (Signed)
    Patient: WYMAN MESCHKE FMW:[id number] DOB: [birth year]      Brief hospital course: 89 y.o. M with HTN, CKD IIIb baseline >1.7, hx esophageal stenosis, and CAD s/p PCI 2013 who presented with vague subacute malaise, SOB.     This is a no charge note, for further details, please see the H&P by my partner, Dr. Keturah from earlier today.   Principal Problem:   Malaise and shortness of breath Active Problems:   Benign essential HTN   Anemia of chronic renal failure, stage 3 (moderate) (HCC)   Coronary artery disease involving native coronary artery without angina pectoris   CKD (chronic kidney disease), stage IIIb (HCC)   Transaminitis    Symptoms nonspecific, likely failure to thrive No focal symptoms of malignancy CXR clear, TSH normal, UA normal, COVID negative. Cr may just be his baseline, not really clear that this is AKI --> trend Cr Hold further fluids okay to hold furosemdie and Farxiga Stop gabapentin  Get Echo, US  abdomen and VQ       Physical Exam: BP 110/60 (BP Location: Left Arm)   Pulse (!) 56   Temp 97.8 F (36.6 C) (Oral)   Resp 19   Ht 5' 3.5 (1.613 m)   Wt 61.7 kg   SpO2 100%   BMI 23.72 kg/m   Patient seen and examined.     Family Communication: None        Author: Lonni SHAUNNA Dalton, MD 08/08/2024 12:32 PM

## 2024-08-09 ENCOUNTER — Observation Stay (HOSPITAL_COMMUNITY)

## 2024-08-09 DIAGNOSIS — R0902 Hypoxemia: Secondary | ICD-10-CM | POA: Diagnosis not present

## 2024-08-09 DIAGNOSIS — R0602 Shortness of breath: Secondary | ICD-10-CM | POA: Diagnosis not present

## 2024-08-09 DIAGNOSIS — J929 Pleural plaque without asbestos: Secondary | ICD-10-CM | POA: Diagnosis not present

## 2024-08-09 DIAGNOSIS — R5381 Other malaise: Secondary | ICD-10-CM | POA: Diagnosis not present

## 2024-08-09 LAB — COMPREHENSIVE METABOLIC PANEL WITH GFR
ALT: 46 U/L — ABNORMAL HIGH (ref 0–44)
AST: 30 U/L (ref 15–41)
Albumin: 2.3 g/dL — ABNORMAL LOW (ref 3.5–5.0)
Alkaline Phosphatase: 65 U/L (ref 38–126)
Anion gap: 10 (ref 5–15)
BUN: 53 mg/dL — ABNORMAL HIGH (ref 8–23)
CO2: 26 mmol/L (ref 22–32)
Calcium: 8.6 mg/dL — ABNORMAL LOW (ref 8.9–10.3)
Chloride: 106 mmol/L (ref 98–111)
Creatinine, Ser: 1.8 mg/dL — ABNORMAL HIGH (ref 0.61–1.24)
GFR, Estimated: 34 mL/min — ABNORMAL LOW (ref >60)
Glucose, Bld: 108 mg/dL — ABNORMAL HIGH (ref 70–99)
Potassium: 4.2 mmol/L (ref 3.5–5.1)
Sodium: 142 mmol/L (ref 135–145)
Total Bilirubin: 0.8 mg/dL (ref 0.0–1.2)
Total Protein: 5.4 g/dL — ABNORMAL LOW (ref 6.5–8.1)

## 2024-08-09 LAB — CBC
HCT: 30.2 % — ABNORMAL LOW (ref 39.0–52.0)
Hemoglobin: 10.1 g/dL — ABNORMAL LOW (ref 13.0–17.0)
MCH: 34 pg (ref 26.0–34.0)
MCHC: 33.4 g/dL (ref 30.0–36.0)
MCV: 101.7 fL — ABNORMAL HIGH (ref 80.0–100.0)
Platelets: 184 K/uL (ref 150–400)
RBC: 2.97 MIL/uL — ABNORMAL LOW (ref 4.22–5.81)
RDW: 14 % (ref 11.5–15.5)
WBC: 5 K/uL (ref 4.0–10.5)
nRBC: 0 % (ref 0.0–0.2)

## 2024-08-09 NOTE — Progress Notes (Signed)
  Progress Note   Patient: Wayne [name] FMW:[id number] DOB: 05-26-1926 DOA: 08/07/2024     0 DOS: the patient was seen and examined on 08/09/2024        Brief hospital course: 89 y.o. M with HTN, CKD IIIb baseline >1.7, hx esophageal stenosis, and CAD s/p PCI 2013 who presented with vague subacute malaise, SOB.      Assessment and Plan: * Malaise and shortness of breath This seems to have resolved to baseline.  EKG shows old RBBB, sinus bradycardia, no change.  TSH is normal.  Urinalysis and COVID are normal.   Chest x-ray clear, troponin unremarkable, echocardiogram normal. - Follow-up VQ scan  -PT/OT  Transaminitis CK normal, abdomen ultrasound normal.  These appear to be resolving with IV fluids.  Unsure what the nature of that was. - Follow-up with PCP  AKI on chronic kidney disease stage IIIb Admitted with Creatinine 2.2, given fluids and improved back to baseline 1.8.  This seems to be his main problem. -Resume home medications - Hold furosemide  and valsartan   Hypertension Coronary artery disease Blood pressure 135/73 - Continue amlodipine  - Continue aspirin , Lipitor - Hold furosemide  and valsartan   Anemia of chronic kidney disease Hemoglobin stable, no clinical bleeding reported or observed         Subjective: Patient feels okay, he has ambulated to the bathroom by himself.  No fever, no cough, no abdominal pain, no nausea, bowel movements normal.     Physical Exam: BP 135/73 (BP Location: Right Arm)   Pulse (!) 46   Temp 98 F (36.7 C) (Oral)   Resp 16   Ht 5' 3.5 (1.613 m)   Wt 63.1 kg Comment: bed weight  SpO2 100%   BMI 24.26 kg/m   Thin elderly male, lying in bed, interactive and appropriate Slow, regular, no murmurs, no peripheral edema Respiratory rate normal, lungs clear without rales or wheezes Abdomen soft, no tenderness palpation or guarding, no ascites or distention Attention normal, affect normal, judgment and insight appear  normal for age, generalized weakness, shuffling gait   Data Reviewed: Basic metabolic panel shows creatinine down to 1.8 CBC shows no leukocytosis.  Anemia 10.1.  Essentially stable.    Family Communication: Daughter at the bedside    Disposition: Status is: Observation         Author: Lonni SHAUNNA Dalton, MD 08/09/2024 5:17 PM  For on call review [email or web address].

## 2024-08-09 NOTE — Plan of Care (Signed)
   Problem: Education: Goal: Knowledge of General Education information will improve Description: Including pain rating scale, medication(s)/side effects and non-pharmacologic comfort measures Outcome: Progressing   Problem: Clinical Measurements: Goal: Will remain free from infection Outcome: Progressing Goal: Diagnostic test results will improve Outcome: Progressing

## 2024-08-09 NOTE — Plan of Care (Signed)

## 2024-08-09 NOTE — NC FL2 (Signed)
 [place]  MEDICAID FL2 LEVEL OF CARE FORM     IDENTIFICATION  Patient Name: Wayne [name] Birthdate: May 31, 1926 Sex: male Admission Date (Current Location): 08/07/2024  [facility] and [place] Number:  Producer, television/film/video and Address:  [facility], [address], [place], [place] [address]      Provider Number: [id number]  Attending Physician Name and Address:  Jonel Lonni SQUIBB, *  Relative Name and Phone Number:       Current Level of Care: Hospital Recommended Level of Care: Skilled Nursing Facility Prior Approval Number:    Date Approved/Denied:   PASRR Number: [id number]  Discharge Plan: SNF    Current Diagnoses: Patient Active Problem List   Diagnosis Date Noted   Malaise and shortness of breath 08/08/2024   Transaminitis 08/08/2024   CKD (chronic kidney disease), stage IIIb (HCC) 08/07/2024   Shortness of breath 08/05/2024   Dysphagia 08/05/2024   Abrasion, leg w/o infection 08/05/2024   Achilles tendinitis 04/28/2024   Bilateral lower extremity edema 04/25/2024   Bilateral impacted cerumen 04/25/2024   Cellulitis 04/21/2024   Right carpal tunnel syndrome 04/21/2024   Venous insufficiency of both lower extremities 04/21/2024   Decreased appetite 02/15/2024   Hyperparathyroidism, secondary renal (HCC) 09/12/2023   Nonintractable headache 06/25/2023   Close exposure to COVID-19 virus 06/25/2023   Weakness 06/25/2023   Generalized osteoarthritis of multiple sites 07/24/2021   Hypertensive heart and renal disease 07/24/2021   Overweight with body mass index (BMI) of 27 to 27.9 in adult 07/24/2021   Other chronic pain 07/24/2021   Atherosclerosis of renal artery (HCC) 12/30/2019   Viral upper respiratory tract infection 03/09/2019   Hearing loss due to cerumen impaction, bilateral 12/04/2018   Acute back pain 06/10/2018   Spondylosis of lumbar region without myelopathy or radiculopathy 06/10/2018   Carpal tunnel syndrome of  left wrist 12/12/2017   Hammertoe 07/01/2016   Anemia of chronic renal failure, stage 3 (moderate) (HCC) 11/23/2015   Dehydration 11/22/2015   Sepsis (HCC) 11/22/2015   Diarrhea 11/22/2015   Leukopenia 11/22/2015   Benign essential HTN 11/22/2015   Coronary artery disease involving native coronary artery without angina pectoris 03/18/2012   Hyperlipidemia 03/18/2012    Orientation RESPIRATION BLADDER Height & Weight     Self, Time, Situation, Place  Normal Continent, External catheter Weight: 139 lb 1.8 oz (63.1 kg) (bed weight) Height:  5' 3.5 (161.3 cm)  BEHAVIORAL SYMPTOMS/MOOD NEUROLOGICAL BOWEL NUTRITION STATUS      Continent Diet  AMBULATORY STATUS COMMUNICATION OF NEEDS Skin   Limited Assist Verbally Normal                       Personal Care Assistance Level of Assistance  Bathing, Feeding, Dressing Bathing Assistance: Limited assistance Feeding assistance: Limited assistance Dressing Assistance: Limited assistance     Functional Limitations Info  Sight, Hearing, Speech Sight Info: Adequate Hearing Info: Adequate Speech Info: Adequate    SPECIAL CARE FACTORS FREQUENCY  PT (By licensed PT), OT (By licensed OT)     PT Frequency: 5x a week OT Frequency: 5x a week            Contractures Contractures Info: Not present    Additional Factors Info  Code Status, Allergies Code Status Info: DNR Allergies Info: Cephalexin           Current Medications (08/09/2024):  This is the current hospital active medication list Current Facility-Administered Medications  Medication Dose Route  Frequency Provider Last Rate Last Admin   acetaminophen  (TYLENOL ) tablet 1,000 mg  1,000 mg Oral Q6H PRN Segars, Dorn, MD       albuterol  (PROVENTIL ) (2.5 MG/3ML) 0.083% nebulizer solution 2.5 mg  2.5 mg Nebulization Q4H PRN Segars, Dorn, MD       amLODipine  (NORVASC ) tablet 5 mg  5 mg Oral Daily Segars, Dorn, MD   5 mg at 08/09/24 0940   aspirin  EC tablet 81 mg   81 mg Oral Daily Segars, Jonathan, MD   81 mg at 08/09/24 0940   atorvastatin  (LIPITOR) tablet 20 mg  20 mg Oral Daily Segars, Jonathan, MD   20 mg at 08/09/24 0940   feeding supplement (ENSURE PLUS HIGH PROTEIN) liquid 237 mL  237 mL Oral BID BM Segars, Jonathan, MD   237 mL at 08/09/24 0941   gabapentin  (NEURONTIN ) capsule 300 mg  300 mg Oral QHS Segars, Jonathan, MD   300 mg at 08/08/24 2059   heparin  injection 5,000 Units  5,000 Units Subcutaneous Q8H Segars, Jonathan, MD   5,000 Units at 08/09/24 9478   Influenza vac split trivalent PF (FLUZONE HIGH-DOSE) injection 0.5 mL  0.5 mL Intramuscular Tomorrow-1000 Arrien, Mauricio Daniel, MD       melatonin tablet 6 mg  6 mg Oral QHS PRN Segars, Jonathan, MD       ondansetron  (ZOFRAN ) injection 4 mg  4 mg Intravenous Q6H PRN Keturah Dorn, MD       oxyCODONE  (Oxy IR/ROXICODONE ) immediate release tablet 7.5 mg  7.5 mg Oral BID PRN Segars, Jonathan, MD       polyethylene glycol (MIRALAX  / GLYCOLAX ) packet 17 g  17 g Oral Daily PRN Keturah Dorn, MD       sodium chloride  flush (NS) 0.9 % injection 3 mL  3 mL Intravenous Q12H Segars, Jonathan, MD   3 mL at 08/09/24 0941   tamsulosin  (FLOMAX ) capsule 0.4 mg  0.4 mg Oral QPC supper Segars, Jonathan, MD   0.4 mg at 08/08/24 1848   timolol  (TIMOPTIC ) 0.5 % ophthalmic solution 1 drop  1 drop Both Eyes Daily Segars, Jonathan, MD   1 drop at 08/09/24 0941     Discharge Medications: Please see discharge summary for a list of discharge medications.  Relevant Imaging Results:  Relevant Lab Results:   Additional Information SSN [id number]  Cena Ligas, LCSW

## 2024-08-09 NOTE — TOC Initial Note (Signed)
 Transition of Care [facility]) - Initial/Assessment Note    Patient Details  Name: Wayne [name] MRN: [id number] Date of Birth: 03/12/1926  Transition of [facility]) CM/SW Contact:    Cena Ligas, LCSW Phone Number: 08/09/2024, 3:12 PM  Clinical Narrative:                  CSW received SNF consult. CSW met with pt at bedside. CSW introduced self and explained role at the hospital. Pt reports that PTA he lived at home alone. Pt reports he was mobile and able to complete all ADL's. Pt gave permission to discuss care with his daughter [place] Jackson, who was also at bedside.  CSW reviewed PT/OT recommendations for SNF. Pt reports he is OK with going to SNF and has not to SNF in the past. Pt gave CSW permission to fax out to facilities in the area. Pt has no preference of facility at this time. CSW gave pt medicare.gov rating list to review. CSW explained insurance auth process.  CSW will continue to follow.   Expected Discharge Plan: Skilled Nursing Facility Barriers to Discharge: Continued Medical Work up   Patient Goals and CMS Choice Patient states their goals for this hospitalization and ongoing recovery are:: TO go to SNF CMS Medicare.gov Compare Post Acute Care list provided to:: Patient Choice offered to / list presented to : Patient, Adult Children      Expected Discharge Plan and Services       Living arrangements for the past 2 months: Single Family Home                                      Prior Living Arrangements/Services Living arrangements for the past 2 months: Single Family Home Lives with:: Self Patient language and need for interpreter reviewed:: Yes        Need for Family Participation in Patient Care: Yes (Comment) Care giver support system in place?: Yes (comment)   Criminal Activity/Legal Involvement Pertinent to Current Situation/Hospitalization: No - Comment as needed  Activities of Daily Living   ADL Screening (condition at time of  admission) Independently performs ADLs?: Yes (appropriate for developmental age) Is the patient deaf or have difficulty hearing?: Yes Does the patient have difficulty seeing, even when wearing glasses/contacts?: No Does the patient have difficulty concentrating, remembering, or making decisions?: No  Permission Sought/Granted Permission sought to share information with : Facility Industrial/product designer granted to share information with : Yes, Verbal Permission Granted     Permission granted to share info w AGENCY: SNF        Emotional Assessment Appearance:: Appears stated age Attitude/Demeanor/Rapport: Engaged Affect (typically observed): Accepting Orientation: : Oriented to Self, Oriented to Place, Oriented to  Time, Oriented to Situation Alcohol / Substance Use: Not Applicable Psych Involvement: No (comment)  Admission diagnosis:  Bradycardia [R00.1] AKI (acute kidney injury) (HCC) [N17.9] Elevated d-dimer [R79.89] Patient Active Problem List   Diagnosis Date Noted   Malaise and shortness of breath 08/08/2024   Transaminitis 08/08/2024   CKD (chronic kidney disease), stage IIIb (HCC) 08/07/2024   Shortness of breath 08/05/2024   Dysphagia 08/05/2024   Abrasion, leg w/o infection 08/05/2024   Achilles tendinitis 04/28/2024   Bilateral lower extremity edema 04/25/2024   Bilateral impacted cerumen 04/25/2024   Cellulitis 04/21/2024   Right carpal tunnel syndrome 04/21/2024   Venous insufficiency of both lower extremities 04/21/2024  Decreased appetite 02/15/2024   Hyperparathyroidism, secondary renal (HCC) 09/12/2023   Nonintractable headache 06/25/2023   Close exposure to COVID-19 virus 06/25/2023   Weakness 06/25/2023   Generalized osteoarthritis of multiple sites 07/24/2021   Hypertensive heart and renal disease 07/24/2021   Overweight with body mass index (BMI) of 27 to 27.9 in adult 07/24/2021   Other chronic pain 07/24/2021   Atherosclerosis of renal  artery (HCC) 12/30/2019   Viral upper respiratory tract infection 03/09/2019   Hearing loss due to cerumen impaction, bilateral 12/04/2018   Acute back pain 06/10/2018   Spondylosis of lumbar region without myelopathy or radiculopathy 06/10/2018   Carpal tunnel syndrome of left wrist 12/12/2017   Hammertoe 07/01/2016   Anemia of chronic renal failure, stage 3 (moderate) (HCC) 11/23/2015   Dehydration 11/22/2015   Sepsis (HCC) 11/22/2015   Diarrhea 11/22/2015   Leukopenia 11/22/2015   Benign essential HTN 11/22/2015   Coronary artery disease involving native coronary artery without angina pectoris 03/18/2012   Hyperlipidemia 03/18/2012   PCP:  Jarold Medici, MD Pharmacy:   [facility]) - [place], [place] - [address] [facility] [address] [place] [address] [place] [address] Phone: [phone] Fax: [phone]  [id number] - [place], [place] - [address] AT [facility] [address] [facility] [address] [place] [address] Phone: [phone] Fax: [phone]  [facility] - [place], [place] - [address] [place] [address] Phone: [phone] Fax: [phone]     Social Drivers of Health (SDOH) Social History: SDOH Screenings   Food Insecurity: No Food Insecurity (08/08/2024)  Housing: Low Risk  (08/08/2024)  Transportation Needs: No Transportation Needs (08/08/2024)  Utilities: Not At Risk (08/08/2024)  Alcohol Screen: Low Risk  (02/05/2024)  Depression (PHQ2-9): Low Risk  (08/04/2024)  Financial Resource Strain: Low Risk  (02/05/2024)  Physical Activity: Inactive (02/05/2024)  Social Connections: Socially Isolated (08/08/2024)  Stress: No Stress Concern Present (02/05/2024)  Tobacco Use: Medium Risk (08/07/2024)  Health Literacy: Adequate Health Literacy (02/05/2024)   SDOH Interventions:     Readmission Risk Interventions     No data to display

## 2024-08-10 ENCOUNTER — Observation Stay (HOSPITAL_COMMUNITY)

## 2024-08-10 ENCOUNTER — Ambulatory Visit: Admitting: Internal Medicine

## 2024-08-10 ENCOUNTER — Encounter (HOSPITAL_COMMUNITY): Payer: Self-pay | Admitting: Internal Medicine

## 2024-08-10 DIAGNOSIS — R0602 Shortness of breath: Secondary | ICD-10-CM | POA: Diagnosis not present

## 2024-08-10 DIAGNOSIS — R5381 Other malaise: Secondary | ICD-10-CM | POA: Diagnosis not present

## 2024-08-10 MED ORDER — TECHNETIUM TO 99M ALBUMIN AGGREGATED
4.0000 | Freq: Once | INTRAVENOUS | Status: AC
Start: 2024-08-10 — End: 2024-08-10
  Administered 2024-08-10: 4 via INTRAVENOUS

## 2024-08-10 NOTE — Progress Notes (Signed)
  Progress Note   Patient: Wayne [name] FMW:[id number] DOB: 1926/03/12 DOA: 08/07/2024     0 DOS: the patient was seen and examined on 08/10/2024        Brief hospital course: 89 y.o. M with HTN, CKD IIIb baseline >1.7, hx esophageal stenosis, and CAD s/p PCI 2013 who presented with vague subacute malaise, SOB.      Assessment and Plan: * Malaise and shortness of breath This seems to have resolved to baseline.  EKG shows old RBBB, sinus bradycardia, no change.  TSH is normal.  Urinalysis and COVID are normal.  Chest x-ray clear, troponin unremarkable, echocardiogram normal.  VQ normal for CXR, no PE.  -PT/OT  Transaminitis CK normal, abdomen ultrasound normal.  Resolved spontaneously. - Repeat labs in 1 week  AKI on chronic kidney disease stage IIIb Admitted with Creatinine 2.2, given fluids and improved back to baseline 1.8.  This seems to be his main problem. - Resume home medications - Check BMP in 1 week - Hold furosemide  and valsartan   Hypertension Coronary artery disease BP normal - Continue amlodipine , aspirin , Lipitor - Hold furosemide  and valsartan    Anemia of chronic kidney disease Hemoglobin stable, no clinical bleeding reported or observed         Subjective: No change.  No new nursing concerns.     Physical Exam: BP 137/74 (BP Location: Left Arm)   Pulse 60   Temp 97.8 F (36.6 C) (Oral)   Resp 17   Ht 5' 3.5 (1.613 m)   Wt 59.6 kg   SpO2 99%   BMI 22.91 kg/m   Thin elderly male, lying in bed, no acute distress RRR no murmurs no LE edema Respiratory rate normal, lungs clear without rales or wheezes Abdomen soft, no TTP, no distension Attention normal, agerelated psychomotor slowing, unsteady gait, moves all extremities with symmetric weakness   Data Reviewed:     Family Communication: Daughter at the bedside    Disposition: Status is: Observation  The patient was admitted for dehydration and AKI   Her normal functional  status is indepdent and lives alone, but is currently debilitated from her baseline, and will require daily rehabilitation for several weeks to return to her previous level of function.  All hospital level cares are completed and she is medically stable, but unsafe to discharge to home given unsteadiness, weakness and fall risk         Author: Lonni SHAUNNA Dalton, MD 08/10/2024 4:36 PM  For on call review [email or web address].

## 2024-08-10 NOTE — Progress Notes (Signed)
 Physical Therapy Treatment Patient Details Name: Wayne [name] MRN: [id number] DOB: [birth year] Today's Date: 08/10/2024   History of Present Illness Patient is 89 yo male admitted on 08/07/24 for fatigue and generalized weakness. PMH significant for hx of CAD with PCI, CKD 3b, HTN, dysphagia requiring esophageal dilation, recent carpal tunnel release (07/15/24).    PT Comments  Pt resting in bed on arrival, agreeable to session with continued progress towards acute goals. Pt demonstrating increased activity tolerance this session, progressing gait distance with CGA for safety and RW for support. Pt continues to demonstrate very low shuffling steps with pt endorsing increased arthritic pain with distance, placing pt at increased risk for falls. Patient will benefit from continued inpatient follow up therapy, <3 hours/day, will continue to follow acutely.    If plan is discharge home, recommend the following: A little help with walking and/or transfers;Supervision due to cognitive status;A little help with bathing/dressing/bathroom;Help with stairs or ramp for entrance;Assist for transportation;Assistance with cooking/housework   Can travel by private vehicle     Yes  Equipment Recommendations  None recommended by PT;Other (comment)    Recommendations for Other Services       Precautions / Restrictions Precautions Precautions: Fall Recall of Precautions/Restrictions: Intact Restrictions Weight Bearing Restrictions Per Provider Order: No     Mobility  Bed Mobility Overal bed mobility: Needs Assistance Bed Mobility: Supine to Sit, Sit to Supine     Supine to sit: Contact guard, HOB elevated Sit to supine: Contact guard assist, HOB elevated, Used rails   General bed mobility comments: CGA for safety with cues for positioning    Transfers Overall transfer level: Needs assistance Equipment used: Rolling walker (2 wheels) Transfers: Sit to/from Stand Sit to Stand: Contact guard  assist           General transfer comment: CGA for safety, light cues for hand placement    Ambulation/Gait Ambulation/Gait assistance: Contact guard assist Gait Distance (Feet): 60 Feet Assistive device: Rolling walker (2 wheels) Gait Pattern/deviations: Decreased stride length, Knee flexed in stance - left, Shuffle Gait velocity: decr     General Gait Details: bilateral foot over pronation, very low clearance with pt shuffling feet, distance lmited by fatigue   Stairs             Wheelchair Mobility     Tilt Bed    Modified Rankin (Stroke Patients Only)       Balance Overall balance assessment: Needs assistance Sitting-balance support: No upper extremity supported, Feet supported Sitting balance-Leahy Scale: Good     Standing balance support: Bilateral upper extremity supported Standing balance-Leahy Scale: Fair Standing balance comment: static standing without AD                            Communication Communication Communication: No apparent difficulties  Cognition Arousal: Alert Behavior During Therapy: WFL for tasks assessed/performed                             Following commands: Intact      Cueing Cueing Techniques: Verbal cues  Exercises Other Exercises Other Exercises: pt declining further exercises due to fatigue and being cold    General Comments General comments (skin integrity, edema, etc.): VSS on RA, daughter present on arrival      Pertinent Vitals/Pain Pain Assessment Pain Assessment: Faces Faces Pain Scale: Hurts little more Pain Location: BLEs (chronic arthritis pain)  Pain Descriptors / Indicators: Aching Pain Intervention(s): Monitored during session, Limited activity within patient's tolerance    Home Living                          Prior Function            PT Goals (current goals can now be found in the care plan section) Acute Rehab PT Goals PT Goal Formulation: With  patient Time For Goal Achievement: 08/22/24 Progress towards PT goals: Progressing toward goals    Frequency    Min 2X/week      PT Plan      Co-evaluation              AM-PAC PT 6 Clicks Mobility   Outcome Measure  Help needed turning from your back to your side while in a flat bed without using bedrails?: A Little Help needed moving from lying on your back to sitting on the side of a flat bed without using bedrails?: A Little Help needed moving to and from a bed to a chair (including a wheelchair)?: A Little Help needed standing up from a chair using your arms (e.g., wheelchair or bedside chair)?: A Little Help needed to walk in hospital room?: A Little Help needed climbing 3-5 steps with a railing? : A Lot 6 Click Score: 17    End of Session   Activity Tolerance: Patient tolerated treatment well Patient left: in bed;with call bell/phone within reach Nurse Communication: Mobility status PT Visit Diagnosis: Unsteadiness on feet (R26.81);Other abnormalities of gait and mobility (R26.89);Muscle weakness (generalized) (M62.81);Difficulty in walking, not elsewhere classified (R26.2)     Time: 8466-8454 PT Time Calculation (min) (ACUTE ONLY): 12 min  Charges:    $Gait Training: 8-22 mins PT General Charges $$ ACUTE PT VISIT: 1 Visit                     Kimara Bencomo R. PTA Acute Rehabilitation Services Office: [phone]   Therisa CHRISTELLA Boor 08/10/2024, 3:55 PM

## 2024-08-10 NOTE — Plan of Care (Signed)

## 2024-08-10 NOTE — TOC Progression Note (Addendum)
 Transition of Care [facility]) - Progression Note    Patient Details  Name: Wayne [name] MRN: [id number] Date of Birth: 1926/08/14  [facility]) CM/SW Contact  Dasie Chancellor LITTIE [place], [place] Phone Number: 08/10/2024, 10:40 AM  Clinical Narrative:    CSW spoke with pt daughter, [place] to follow up with SNF choice. [place] stated their first choice is [facility], CSW noted that [facility] is still pending approval in the hub but will check in with facility to inquire about acceptance. CSW emailed [place] a list of SNF facilities that have accepted pt and will follow up on facility choice.   1:40 PM- CSW spoke with pt daughter, she chose [facility] as their first facility choice. CSW confirmed bed availability with [facility] and initited insurance auth process ref# [id number]. [facility] can accept pt pending insurance auth.  Expected Discharge Plan: Skilled Nursing Facility Barriers to Discharge: Continued Medical Work up               Expected Discharge Plan and Services       Living arrangements for the past 2 months: Single Family Home                                       Social Drivers of Health (SDOH) Interventions SDOH Screenings   Food Insecurity: No Food Insecurity (08/08/2024)  Housing: Low Risk  (08/08/2024)  Transportation Needs: No Transportation Needs (08/08/2024)  Utilities: Not At Risk (08/08/2024)  Alcohol Screen: Low Risk  (02/05/2024)  Depression (PHQ2-9): Low Risk  (08/04/2024)  Financial Resource Strain: Low Risk  (02/05/2024)  Physical Activity: Inactive (02/05/2024)  Social Connections: Socially Isolated (08/08/2024)  Stress: No Stress Concern Present (02/05/2024)  Tobacco Use: Medium Risk (08/10/2024)  Health Literacy: Adequate Health Literacy (02/05/2024)    Readmission Risk Interventions     No data to display

## 2024-08-10 NOTE — Progress Notes (Signed)
 Occupational Therapy Treatment Patient Details Name: Wayne [name] MRN: [id number] DOB: 1926-10-18 Today's Date: 08/10/2024   History of present illness Patient is 89 yo male admitted on 08/07/24 for fatigue and generalized weakness. PMH significant for hx of CAD with PCI, CKD 3b, HTN, dysphagia requiring esophageal dilation, recent carpal tunnel release (07/15/24).   OT comments  Pt progressing toward goals this session, needs up to CGA for ADLs, bed mobility and transfers with RW. Pt reports arthritic pain in hands and feet, able to ambulate short household distance before fatiguing. Pt needing assist to open small containers on lunch tray. Pt presenting with impairments listed below, will follow acutely. Patient will benefit from continued inpatient follow up therapy, <3 hours/day to maximize safety/ind with ADL/functional mobility.       If plan is discharge home, recommend the following:  A little help with walking and/or transfers;A little help with bathing/dressing/bathroom;Assistance with cooking/housework;Assist for transportation   Equipment Recommendations  Other (comment) (defer)    Recommendations for Other Services PT consult    Precautions / Restrictions Precautions Precautions: Fall Recall of Precautions/Restrictions: Intact Restrictions Weight Bearing Restrictions Per Provider Order: No       Mobility Bed Mobility Overal bed mobility: Needs Assistance Bed Mobility: Supine to Sit     Supine to sit: Contact guard          Transfers Overall transfer level: Needs assistance Equipment used: Rolling walker (2 wheels) Transfers: Sit to/from Stand Sit to Stand: Contact guard assist                 Balance Overall balance assessment: Needs assistance Sitting-balance support: No upper extremity supported, Feet supported Sitting balance-Leahy Scale: Good     Standing balance support: Bilateral upper extremity supported Standing balance-Leahy Scale:  Fair Standing balance comment: static standing without AD                           ADL either performed or assessed with clinical judgement   ADL Overall ADL's : Needs assistance/impaired                     Lower Body Dressing: Contact guard assist;Minimal assistance   Toilet Transfer: Contact guard assist;BSC/3in1;Rolling walker (2 wheels);Grab bars           Functional mobility during ADLs: Contact guard assist;Rolling walker (2 wheels)      Extremity/Trunk Assessment Upper Extremity Assessment Upper Extremity Assessment:  (recent carpal tunnel sx with limited extention in 5th digit, reporting numbness is improving)   Lower Extremity Assessment Lower Extremity Assessment: Defer to PT evaluation        Vision   Vision Assessment?: No apparent visual deficits   Perception Perception Perception: Not tested   Praxis Praxis Praxis: Not tested   Communication Communication Communication: No apparent difficulties   Cognition Arousal: Alert Behavior During Therapy: WFL for tasks assessed/performed Cognition: No apparent impairments                               Following commands: Intact        Cueing   Cueing Techniques: Verbal cues  Exercises      Shoulder Instructions       General Comments VSS    Pertinent Vitals/ Pain       Pain Assessment Pain Assessment: No/denies pain  Home Living  Prior Functioning/Environment              Frequency  Min 2X/week        Progress Toward Goals  OT Goals(current goals can now be found in the care plan section)  Progress towards OT goals: Progressing toward goals  Acute Rehab OT Goals Patient Stated Goal: none stated OT Goal Formulation: With patient Time For Goal Achievement: 08/22/24 Potential to Achieve Goals: Good ADL Goals Pt Will Perform Upper Body Bathing: Independently Pt Will Perform Lower  Body Bathing: with modified independence Pt Will Perform Upper Body Dressing: Independently Pt Will Perform Lower Body Dressing: with modified independence Pt Will Transfer to Toilet: with modified independence Pt Will Perform Toileting - Clothing Manipulation and hygiene: with modified independence  Plan      Co-evaluation                 AM-PAC OT 6 Clicks Daily Activity     Outcome Measure   Help from another person eating meals?: A Little Help from another person taking care of personal grooming?: A Little Help from another person toileting, which includes using toliet, bedpan, or urinal?: A Little Help from another person bathing (including washing, rinsing, drying)?: A Little Help from another person to put on and taking off regular upper body clothing?: A Little Help from another person to put on and taking off regular lower body clothing?: A Little 6 Click Score: 18    End of Session Equipment Utilized During Treatment: Gait belt;Rolling walker (2 wheels)  OT Visit Diagnosis: Unsteadiness on feet (R26.81);Other abnormalities of gait and mobility (R26.89);Repeated falls (R29.6);Muscle weakness (generalized) (M62.81)   Activity Tolerance Patient tolerated treatment well   Patient Left in chair;with call bell/phone within reach;with chair alarm set   Nurse Communication Mobility status        Time: 8878-8858 OT Time Calculation (min): 20 min  Charges: OT General Charges $OT Visit: 1 Visit OT Treatments $Therapeutic Activity: 8-22 mins  Fletcher Ostermiller K, OTD, OTR/L Secure[employer] Preferred Acute Rehab [phone]   Laneta POUR Koonce 08/10/2024, 12:07 PM

## 2024-08-10 NOTE — Plan of Care (Signed)

## 2024-08-10 NOTE — Progress Notes (Signed)
 Mobility Specialist Progress Note:    08/10/24 0951  Mobility  Activity Ambulated with assistance (From BR to bed)  Level of Assistance Contact guard assist, steadying assist  Assistive Device Front wheel walker  Distance Ambulated (ft) 10 ft  Activity Response Tolerated well  Mobility Referral Yes  Mobility visit 1 Mobility  Mobility Specialist Start Time (ACUTE ONLY) [id number]  Mobility Specialist Stop Time (ACUTE ONLY) 1001  Mobility Specialist Time Calculation (min) (ACUTE ONLY) 10 min   Responded to pt's BR light, requesting assistance back to bed. No physical assistance needed. No c/o. Left pt in bed. Personal belongings and call light within reach. All needs met.  Lavanda Pollack Mobility Specialist  Please contact via [employer] or  Rehab Office [phone]

## 2024-08-11 DIAGNOSIS — D631 Anemia in chronic kidney disease: Secondary | ICD-10-CM | POA: Diagnosis not present

## 2024-08-11 DIAGNOSIS — I129 Hypertensive chronic kidney disease with stage 1 through stage 4 chronic kidney disease, or unspecified chronic kidney disease: Secondary | ICD-10-CM | POA: Diagnosis not present

## 2024-08-11 DIAGNOSIS — K222 Esophageal obstruction: Secondary | ICD-10-CM | POA: Diagnosis not present

## 2024-08-11 DIAGNOSIS — N183 Chronic kidney disease, stage 3 unspecified: Secondary | ICD-10-CM | POA: Diagnosis not present

## 2024-08-11 DIAGNOSIS — Z7982 Long term (current) use of aspirin: Secondary | ICD-10-CM | POA: Diagnosis not present

## 2024-08-11 DIAGNOSIS — R7401 Elevation of levels of liver transaminase levels: Secondary | ICD-10-CM | POA: Diagnosis not present

## 2024-08-11 DIAGNOSIS — R531 Weakness: Secondary | ICD-10-CM | POA: Diagnosis not present

## 2024-08-11 DIAGNOSIS — M6281 Muscle weakness (generalized): Secondary | ICD-10-CM | POA: Diagnosis not present

## 2024-08-11 DIAGNOSIS — R791 Abnormal coagulation profile: Secondary | ICD-10-CM | POA: Diagnosis not present

## 2024-08-11 DIAGNOSIS — N179 Acute kidney failure, unspecified: Secondary | ICD-10-CM | POA: Diagnosis not present

## 2024-08-11 DIAGNOSIS — R7989 Other specified abnormal findings of blood chemistry: Secondary | ICD-10-CM | POA: Diagnosis not present

## 2024-08-11 DIAGNOSIS — K760 Fatty (change of) liver, not elsewhere classified: Secondary | ICD-10-CM | POA: Diagnosis not present

## 2024-08-11 DIAGNOSIS — I2510 Atherosclerotic heart disease of native coronary artery without angina pectoris: Secondary | ICD-10-CM | POA: Diagnosis not present

## 2024-08-11 DIAGNOSIS — N189 Chronic kidney disease, unspecified: Secondary | ICD-10-CM | POA: Diagnosis not present

## 2024-08-11 DIAGNOSIS — R0602 Shortness of breath: Secondary | ICD-10-CM | POA: Diagnosis not present

## 2024-08-11 DIAGNOSIS — I10 Essential (primary) hypertension: Secondary | ICD-10-CM | POA: Diagnosis not present

## 2024-08-11 DIAGNOSIS — I1310 Hypertensive heart and chronic kidney disease without heart failure, with stage 1 through stage 4 chronic kidney disease, or unspecified chronic kidney disease: Secondary | ICD-10-CM | POA: Diagnosis not present

## 2024-08-11 DIAGNOSIS — R262 Difficulty in walking, not elsewhere classified: Secondary | ICD-10-CM | POA: Diagnosis not present

## 2024-08-11 DIAGNOSIS — Z23 Encounter for immunization: Secondary | ICD-10-CM | POA: Diagnosis not present

## 2024-08-11 DIAGNOSIS — Z79899 Other long term (current) drug therapy: Secondary | ICD-10-CM | POA: Diagnosis not present

## 2024-08-11 DIAGNOSIS — R5381 Other malaise: Secondary | ICD-10-CM | POA: Diagnosis not present

## 2024-08-11 DIAGNOSIS — N1832 Chronic kidney disease, stage 3b: Secondary | ICD-10-CM | POA: Diagnosis not present

## 2024-08-11 DIAGNOSIS — R2689 Other abnormalities of gait and mobility: Secondary | ICD-10-CM | POA: Diagnosis not present

## 2024-08-11 MED ORDER — GABAPENTIN 300 MG PO CAPS: 300.0000 mg | ORAL_CAPSULE | Freq: Every day | ORAL | Status: DC

## 2024-08-11 MED ORDER — INFLUENZA VAC SPLIT HIGH-DOSE 0.5 ML IM SUSY
0.5000 mL | PREFILLED_SYRINGE | Freq: Once | INTRAMUSCULAR | Status: AC
  Administered 2024-08-11: 0.5 mL via INTRAMUSCULAR
  Filled 2024-08-11: qty 0.5

## 2024-08-11 MED ORDER — FUROSEMIDE 40 MG PO TABS: 40.0000 mg | ORAL_TABLET | Freq: Every day | ORAL | Status: AC

## 2024-08-11 MED ORDER — LOPERAMIDE HCL 2 MG PO CAPS: 2.0000 mg | ORAL_CAPSULE | ORAL | Status: AC | PRN

## 2024-08-11 MED ORDER — ENSURE PLUS HIGH PROTEIN PO LIQD: 237.0000 mL | Freq: Two times a day (BID) | ORAL | Status: DC

## 2024-08-11 MED ORDER — ACETAMINOPHEN 500 MG PO TABS: 1000.0000 mg | ORAL_TABLET | Freq: Four times a day (QID) | ORAL | 0 refills | Status: AC | PRN

## 2024-08-11 MED ORDER — OXYCODONE-ACETAMINOPHEN 7.5-325 MG PO TABS: 1.0000 | ORAL_TABLET | ORAL | 0 refills | Status: AC

## 2024-08-11 MED ORDER — LOPERAMIDE HCL 2 MG PO CAPS
2.0000 mg | ORAL_CAPSULE | ORAL | Status: DC | PRN
  Administered 2024-08-11: 2 mg via ORAL
  Filled 2024-08-11: qty 1

## 2024-08-11 NOTE — Progress Notes (Signed)
   08/11/24 0910  Mobility  Activity Ambulated with assistance  Level of Assistance Contact guard assist, steadying assist  Assistive Device Front wheel walker  Distance Ambulated (ft) 225 ft  Activity Response Tolerated fair  Mobility Referral Yes  Mobility visit 1 Mobility  Mobility Specialist Start Time (ACUTE ONLY) 0910  Mobility Specialist Stop Time (ACUTE ONLY) 0943  Mobility Specialist Time Calculation (min) (ACUTE ONLY) 33 min   Mobility Specialist: Progress Note  Pt agreeable to mobility session - received in bed. C/o R foot pain. Ambulated to BR, BM complete, pericare ind. Returned to chair with all needs met - call bell within reach. Chair alarm on.   Virgle Boards, BS Mobility Specialist Please contact via [employer] or  Rehab office at [phone].

## 2024-08-11 NOTE — TOC Transition Note (Signed)
 Transition of Care [facility]) - Discharge Note   Patient Details  Name: Wayne [name] MRN: [id number] Date of Birth: 05/08/1926  [facility] Care [facility]) CM/SW Contact:  Jeoffrey LITTIE Maranda [place] Phone Number: 08/11/2024, 11:45 AM   Clinical Narrative:    Patient will DC to: [facility] Anticipated DC date: 08/11/24 Family notified: Yes Transport by: Private vehicle (daughter)   Per MD patient ready for DC to [facility]. RN to call report prior to discharge [phone] room [phone]. RN, patient, patient's family, and facility notified of DC. Discharge Summary and FL2 sent to facility. CSW will sign off for now as social work intervention is no longer needed. Please consult us  again if new needs arise.     Final next level of care: Skilled Nursing Facility Barriers to Discharge: Barriers Resolved   Patient Goals and CMS Choice Patient states their goals for this hospitalization and ongoing recovery are:: TO go to SNF CMS Medicare.gov Compare Post Acute Care list provided to:: Patient Choice offered to / list presented to : Patient, Adult Children      Discharge Placement   Existing PASRR number confirmed : 08/11/24          Patient [facility] bed at: [facility] Patient to be transferred to facility by: Daughter will take him to facility Name of family member notified: [place] Patient and family notified of of transfer: 08/11/24  Discharge Plan and Services Additional resources added to the After Visit Summary for                                       Social Drivers of Health (SDOH) Interventions SDOH Screenings   Food Insecurity: No Food Insecurity (08/08/2024)  Housing: Low Risk  (08/08/2024)  Transportation Needs: No Transportation Needs (08/08/2024)  Utilities: Not At Risk (08/08/2024)  Alcohol Screen: Low Risk  (02/05/2024)  Depression (PHQ2-9): Low Risk  (08/04/2024)  Financial Resource Strain: Low Risk  (02/05/2024)  Physical Activity: Inactive (02/05/2024)  Social  Connections: Socially Isolated (08/08/2024)  Stress: No Stress Concern Present (02/05/2024)  Tobacco Use: Medium Risk (08/10/2024)  Health Literacy: Adequate Health Literacy (02/05/2024)     Readmission Risk Interventions     No data to display

## 2024-08-11 NOTE — TOC Progression Note (Addendum)
 Transition of Care [facility]) - Progression Note    Patient Details  Name: QUADRE BRISTOL MRN: [id number] Date of Birth: June 19, 1926  [facility]) CM/SW Contact  Eryn Krejci LITTIE [place], [place] Phone Number: 08/11/2024, 9:48 AM  Clinical Narrative:    CSW received insurance auth approval for [facility] ref# [id number] effective 9/8-9/12/25. [facility] can accept pt when medically ready for DC.  Expected Discharge Plan: Skilled Nursing Facility Barriers to Discharge: Continued Medical Work up               Expected Discharge Plan and Services       Living arrangements for the past 2 months: Single Family Home                                       Social Drivers of Health (SDOH) Interventions SDOH Screenings   Food Insecurity: No Food Insecurity (08/08/2024)  Housing: Low Risk  (08/08/2024)  Transportation Needs: No Transportation Needs (08/08/2024)  Utilities: Not At Risk (08/08/2024)  Alcohol Screen: Low Risk  (02/05/2024)  Depression (PHQ2-9): Low Risk  (08/04/2024)  Financial Resource Strain: Low Risk  (02/05/2024)  Physical Activity: Inactive (02/05/2024)  Social Connections: Socially Isolated (08/08/2024)  Stress: No Stress Concern Present (02/05/2024)  Tobacco Use: Medium Risk (08/10/2024)  Health Literacy: Adequate Health Literacy (02/05/2024)    Readmission Risk Interventions     No data to display

## 2024-08-11 NOTE — Discharge Summary (Addendum)
 Physician Discharge Summary   Patient: Wayne [name] MRN: [id number] DOB: 09-02-1926  Admit date:     08/07/2024  Discharge date: 08/11/24  Discharge Physician: Lonni SHAUNNA Dalton   PCP: Jarold Medici, MD     Recommendations at discharge:  Follow up with PCP Dr. Jarold in 1 week after discharge from SNF SNF: Please recheck BMP and LFTs in 1 week; resume lisinopril and/or furosemide  as appropriate     Discharge Diagnoses: Principal Problem:   Malaise and shortness of breath Active Problems:   Benign essential HTN   Anemia of chronic renal failure, stage 3 (moderate) (HCC)   Coronary artery disease involving native coronary artery without angina pectoris   CKD (chronic kidney disease), stage IIIb (HCC)   Transaminitis      Hospital Course: 89 y.o. M with HTN, CKD IIIb baseline >1.7, hx esophageal stenosis, and CAD s/p PCI 2013 who presented with vague subacute malaise, SOB.      * Malaise and shortness of breath Patient presented with nonspecific failure to thrive like symptoms.  He did have mild transaminitis but this resolved by itself.  Imaging of the liver was normal.  EKG showed old RBBB, sinus bradycardia.  No change.  TSH normal, urinalysis normal.  COVID-negative.  Chest x-ray clear.  Troponin unremarkable.  Echocardiogram normal.  VQ scan negative.    No explanation for symptoms other than age-related deconditioning, gradual decline.  Transaminitis CK was normal, abdomen ultrasound normal.  LFTs resolved with IV fluids. - Can follow-up with PCP  AKI on chronic kidney disease stage IIIb Admitted with creatinine 2.2, given fluids and creatinine improved to 1.8 baseline.    Hypertension Coronary artery disease Blood pressure was normal off furosemide  and valsartan .  Amlodipine  was continued.  Aspirin  and Lipitor were continued.   - HOLD furosemide  and valsartan  for now - Recommend following blood pressure and renal function, and resumption of  furosemide  and valsartan  as appropriate  Anemia of chronic kidney disease Hemoglobin stable, no bleeding observed or reported         The [place]  Controlled Substances Registry was reviewed for this patient prior to discharge.  Consultants: None Procedures performed: Echocardiogram VQ scan  Disposition: Skilled nursing facility Diet recommendation:  Discharge Diet Orders (From admission, onward)     Start     Ordered   08/11/24 0000  Diet - low sodium heart healthy        08/11/24 1033             DISCHARGE MEDICATION: Allergies as of 08/11/2024       Reactions   Cephalexin    Severe mouth and throat dryness.        Medication List     PAUSE taking these medications    furosemide  40 MG tablet Wait to take this until your doctor or other care provider tells you to start again. Commonly known as: LASIX  Take 1 tablet (40 mg total) by mouth daily. Take 80mg  (2 tablets) by mouth every morning and 40mg  (1 tablet) every evening. What changed:  how much to take when to take this   valsartan  80 MG tablet Wait to take this until your doctor or other care provider tells you to start again. Commonly known as: DIOVAN  Take 80 mg by mouth daily.       STOP taking these medications    senna 8.6 MG tablet Commonly known as: SENOKOT       TAKE these medications    acetaminophen   500 MG tablet Commonly known as: TYLENOL  Take 2 tablets (1,000 mg total) by mouth every 6 (six) hours as needed for mild pain (88 score 1-3).   amLODipine  5 MG tablet Commonly known as: NORVASC  TAKE 1 TABLET BY MOUTH DAILY   aspirin  EC 81 MG tablet Take 81 mg by mouth daily.   atorvastatin  20 MG tablet Commonly known as: Lipitor TAKE M,W,F ONLY.   feeding supplement Liqd Take 237 mLs by mouth 2 (two) times daily between meals.   gabapentin  300 MG capsule Commonly known as: NEURONTIN  Take 1 capsule (300 mg total) by mouth at bedtime. What changed: when to take  this   loperamide  2 MG capsule Commonly known as: IMODIUM  Take 1 capsule (2 mg total) by mouth as needed for diarrhea or loose stools.   oxyCODONE -acetaminophen  7.5-325 MG tablet Commonly known as: PERCOCET Take 1 tablet by mouth See admin instructions. Take 1 tablet by mouth daily. Take an extra tablet if needed throughout the day if needed.   tamsulosin  0.4 MG Caps capsule Commonly known as: FLOMAX  Take 1 capsule (0.4 mg total) by mouth daily.   timolol  0.5 % ophthalmic solution Commonly known as: TIMOPTIC  Place 1 drop into both eyes daily.        Contact information for follow-up providers     Jarold Medici, MD. Schedule an appointment as soon as possible for a visit in 1 week(s).   Specialty: Internal Medicine Contact information: [address] [place] [address] [phone]              Contact information for after-discharge care     Destination     [facility] .   Service: Skilled Nursing Contact information: [address] [place]  [address] [phone]                     Discharge Instructions     Diet - low sodium heart healthy   Complete by: As directed    Increase activity slowly   Complete by: As directed    No wound care   Complete by: As directed        Discharge Exam: Filed Weights   08/09/24 0500 08/10/24 0500 08/11/24 0354  Weight: 63.1 kg 59.6 kg 62.8 kg    General: Pt is alert, awake, not in acute distress, sitting up in the chair complaining of some loose stools Cardiovascular: RRR, nl S1-S2, no murmurs appreciated.   No LE edema.   Respiratory: Normal respiratory rate and rhythm.  CTAB without rales or wheezes. Abdominal: Abdomen soft and non-tender.  No distension or HSM.   Neuro/Psych: Strength symmetric in upper and lower extremities.  Judgment and insight appear at baseline.   Condition at discharge: fair  The results of significant  diagnostics from this hospitalization (including imaging, microbiology, ancillary and laboratory) are listed below for reference.   Imaging Studies: NM Pulmonary Perfusion Addendum Date: 08/10/2024 ADDENDUM REPORT: 08/10/2024 13:58 ADDENDUM: There is a voice recognition error in the findings section. The last sentence of the findings section should read: There are NO wedge-shaped perfusion defects to suggest acute pulmonary embolism. Electronically Signed   By: Elsie Perone M.D.   On: 08/10/2024 13:58   Result Date: 08/10/2024 CLINICAL DATA:  Shortness of breath.  Malaise. EXAM: NUCLEAR MEDICINE PERFUSION LUNG SCAN TECHNIQUE: Perfusion images were obtained in multiple projections after intravenous injection of radiopharmaceutical. No ventilation imaging performed. RADIOPHARMACEUTICALS:  4.0 mCi Tc-69m MAA IV  COMPARISON:  Prior chest radiographs 08/09/2024, 08/07/2024 and 08/20/2017. Chest CT 07/23/2006. FINDINGS: As shown on prior radiographs, there is chronic volume loss in the left hemithorax with associated pleural calcifications. There is corresponding diffusely decreased perfusion to the left lung. The right lung perfusion appears within normal limits. There are new wedge-shaped perfusion defects to suggest acute pulmonary embolism. IMPRESSION: No evidence of acute pulmonary embolism on perfusion scintigraphy by PISAPED criteria. Chronic volume loss and pleural calcification in the left hemithorax, similar to prior studies, likely post traumatic in etiology. Electronically Signed: By: Elsie Perone M.D. On: 08/10/2024 13:42   DG Chest 2 View Result Date: 08/09/2024 CLINICAL DATA:  Hypoxia, shortness of breath. EXAM: CHEST - 2 VIEW COMPARISON:  August 07, 2024. FINDINGS: Stable cardiomediastinal silhouette. Right lung is clear. Large calcified pleural plaque is seen in left lung base consistent with asbestos exposure. No definite acute pulmonary abnormality is noted. Bony thorax is unremarkable.  IMPRESSION: Large calcified pleural plaque is seen in left lung base consistent with asbestos exposure. No definite acute pulmonary abnormality is noted. Electronically Signed   By: Lynwood Landy Raddle M.D.   On: 08/09/2024 17:18   US  Abdomen Limited RUQ (LIVER/GB) Result Date: 08/08/2024 CLINICAL DATA:  Transaminitis EXAM: ULTRASOUND ABDOMEN LIMITED RIGHT UPPER QUADRANT COMPARISON:  CT 02/08/2023 FINDINGS: Gallbladder: Small amount of sludge within the gallbladder. No visible stones. No wall thickening or sonographic Murphy sign. Common bile duct: Diameter: 7 mm in diameter, normal for patient's age. Liver: Increased echotexture compatible with fatty infiltration. No focal abnormality or biliary ductal dilatation. Portal vein is patent on color Doppler imaging with normal direction of blood flow towards the liver. Other: None. IMPRESSION: Hepatic steatosis. Small amount of sludge within the gallbladder. Electronically Signed   By: Franky Crease M.D.   On: 08/08/2024 17:33   ECHOCARDIOGRAM COMPLETE Result Date: 08/08/2024    ECHOCARDIOGRAM REPORT   Patient Name:   Wayne [name] Date of Exam: 08/08/2024 Medical Rec #:  [id number]        Height:       63.5 in Accession #:    [id number]       Weight:       136.0 lb Date of Birth:  July 07, 1926       BSA:          1.651 m Patient Age:    89 years         BP:           110/60 mmHg Patient Gender: M                HR:           51 bpm. Exam Location:  Inpatient Procedure: 2D Echo, Cardiac Doppler and Color Doppler (Both Spectral and Color            Flow Doppler were utilized during procedure). Indications:    elevated troponin  History:        Patient has no prior history of Echocardiogram examinations.                 CAD, CKD, Signs/Symptoms:Shortness of Breath; Risk                 Factors:Former Smoker, Hypertension and Dyslipidemia.  Sonographer:    Koleen Popper RDCS Referring Phys: [id number] [place] Kuang P Arantza Darrington  Sonographer Comments: No apical window. Image  acquisition challenging due to respiratory motion. IMPRESSIONS  1. Left ventricular ejection fraction, by estimation, is 60 to  65%. The left ventricle has normal function. The left ventricle has no regional wall motion abnormalities. There is mild asymmetric left ventricular hypertrophy of the septal segment. Left ventricular diastolic parameters are indeterminate.  2. Right ventricular systolic function is normal. The right ventricular size is normal. There is normal pulmonary artery systolic pressure. The estimated right ventricular systolic pressure is 25.3 mmHg.  3. The mitral valve is grossly normal. Trivial mitral valve regurgitation.  4. The tricuspid valve is degenerative.  5. The aortic valve is tricuspid. Aortic valve regurgitation is not visualized.  6. The inferior vena cava is normal in size with greater than 50% respiratory variability, suggesting right atrial pressure of 3 mmHg. Comparison(s): Prior images reviewed side by side. FINDINGS  Left Ventricle: Left ventricular ejection fraction, by estimation, is 60 to 65%. The left ventricle has normal function. The left ventricle has no regional wall motion abnormalities. The left ventricular internal cavity size was normal in size. There is  mild asymmetric left ventricular hypertrophy of the septal segment. Left ventricular diastolic parameters are indeterminate. Right Ventricle: The right ventricular size is normal. No increase in right ventricular wall thickness. Right ventricular systolic function is normal. There is normal pulmonary artery systolic pressure. The tricuspid regurgitant velocity is 2.36 m/s, and  with an assumed right atrial pressure of 3 mmHg, the estimated right ventricular systolic pressure is 25.3 mmHg. Left Atrium: Left atrial size was normal in size. Right Atrium: Right atrial size was normal in size. Pericardium: There is no evidence of pericardial effusion. Mitral Valve: The mitral valve is grossly normal. Trivial mitral valve  regurgitation. Tricuspid Valve: The tricuspid valve is degenerative in appearance. Tricuspid valve regurgitation is mild. Aortic Valve: The aortic valve is tricuspid. Aortic valve regurgitation is not visualized. Pulmonic Valve: The pulmonic valve was grossly normal. Pulmonic valve regurgitation is trivial. Aorta: The aortic root and ascending aorta are structurally normal, with no evidence of dilitation. Venous: The inferior vena cava is normal in size with greater than 50% respiratory variability, suggesting right atrial pressure of 3 mmHg. IAS/Shunts: No atrial level shunt detected by color flow Doppler. Additional Comments: 3D was performed not requiring image post processing on an independent workstation and was indeterminate.  LEFT VENTRICLE PLAX 2D LVIDd:         4.50 cm   Diastology LVIDs:         2.70 cm   LV e' medial:   5.11 cm/s LV PW:         1.00 cm   LV E/e' medial: 10.2 LV IVS:        1.20 cm LVOT diam:     2.20 cm LV SV:         82 LV SV Index:   50 LVOT Area:     3.80 cm  IVC IVC diam: 1.70 cm LEFT ATRIUM         Index LA diam:    2.60 cm 1.57 cm/m  AORTIC VALVE LVOT Vmax:   95.40 cm/s LVOT Vmean:  59.200 cm/s LVOT VTI:    0.216 m  AORTA Ao Root diam: 3.20 cm Ao Asc diam:  3.60 cm MITRAL VALVE               TRICUSPID VALVE MV Area (PHT): 2.05 cm    TR Peak grad:   22.3 mmHg MV Decel Time: 370 msec    TR Vmax:        236.00 cm/s MV E velocity: 51.90 cm/s MV A  velocity: 97.50 cm/s  SHUNTS MV E/A ratio:  0.53        Systemic VTI:  0.22 m                            Systemic Diam: 2.20 cm Jayson Sierras MD Electronically signed by Jayson Sierras MD Signature Date/Time: 08/08/2024/3:28:17 PM    Final    DG Chest Portable 1 View Result Date: 08/07/2024 CLINICAL DATA:  Short of breath, weakness EXAM: PORTABLE CHEST 1 VIEW COMPARISON:  08/04/2024 FINDINGS: Single frontal view of the chest demonstrates a stable cardiac silhouette. Stable volume loss in the left hemithorax and left pleural calcifications  unchanged. No acute airspace disease, effusion, or pneumothorax. No acute bony abnormalities. IMPRESSION: 1. Stable chest, no acute process. Electronically Signed   By: Ozell Daring M.D.   On: 08/07/2024 16:54   DG Chest 2 View Result Date: 08/04/2024 CLINICAL DATA:  Cough and shortness of breath.  Fatigue. EXAM: CHEST - 2 VIEW COMPARISON:  Chest radiograph 08/10/17, remote CT 07/22/2006 FINDINGS: Stable volume loss in the left hemithorax. Unchanged scarring at the left lung base with pleural calcifications. The heart is normal in size. Stable mediastinal contours. No acute airspace disease. No pneumothorax or pleural effusion. Degenerative change in the spine. IMPRESSION: 1. No acute chest findings. 2. Stable volume loss in the left hemithorax with scarring and pleural calcifications at the left lung base. Electronically Signed   By: Andrea Gasman M.D.   On: 08/04/2024 17:03    Microbiology: Results for orders placed or performed during the hospital encounter of 08/07/24  Resp panel by RT-PCR (RSV, Flu A&B, Covid) Anterior Nasal Swab     Status: None   Collection Time: 08/07/24  5:20 PM   Specimen: Anterior Nasal Swab  Result Value Ref Range Status   SARS Coronavirus 2 by RT PCR NEGATIVE NEGATIVE Final    Comment: (NOTE) SARS-CoV-2 target nucleic acids are NOT DETECTED.  The SARS-CoV-2 RNA is generally detectable in upper respiratory specimens during the acute phase of infection. The lowest concentration of SARS-CoV-2 viral copies this assay can detect is 138 copies/mL. A negative result does not preclude SARS-Cov-2 infection and should not be used as the sole basis for treatment or other patient management decisions. A negative result may occur with  improper specimen collection/handling, submission of specimen other than nasopharyngeal swab, presence of viral mutation(s) within the areas targeted by this assay, and inadequate number of viral copies(<138 copies/mL). A negative result  must be combined with clinical observations, patient history, and epidemiological information. The expected result is Negative.  Fact Sheet for Patients:  BloggerCourse.com  Fact Sheet for Healthcare Providers:  SeriousBroker.it  This test is no t yet approved or cleared by the [place]  FDA and  has been authorized for detection and/or diagnosis of SARS-CoV-2 by FDA under an Emergency Use Authorization (EUA). This EUA will remain  in effect (meaning this test can be used) for the duration of the COVID-19 declaration under Section 564(b)(1) of the Act, 21 U.S.C.section 360bbb-3(b)(1), unless the authorization is terminated  or revoked sooner.       Influenza A by PCR NEGATIVE NEGATIVE Final   Influenza B by PCR NEGATIVE NEGATIVE Final    Comment: (NOTE) The Xpert Xpress SARS-CoV-2/FLU/RSV plus assay is intended as an aid in the diagnosis of influenza from Nasopharyngeal swab specimens and should not be used as a sole basis for treatment. Nasal washings and aspirates  are unacceptable for Xpert Xpress SARS-CoV-2/FLU/RSV testing.  Fact Sheet for Patients: BloggerCourse.com  Fact Sheet for Healthcare Providers: SeriousBroker.it  This test is not yet approved or cleared by the [place]  FDA and has been authorized for detection and/or diagnosis of SARS-CoV-2 by FDA under an Emergency Use Authorization (EUA). This EUA will remain in effect (meaning this test can be used) for the duration of the COVID-19 declaration under Section 564(b)(1) of the Act, 21 U.S.C. section 360bbb-3(b)(1), unless the authorization is terminated or revoked.     Resp Syncytial Virus by PCR NEGATIVE NEGATIVE Final    Comment: (NOTE) Fact Sheet for Patients: BloggerCourse.com  Fact Sheet for Healthcare Providers: SeriousBroker.it  This test is  not yet approved or cleared by the [place]  FDA and has been authorized for detection and/or diagnosis of SARS-CoV-2 by FDA under an Emergency Use Authorization (EUA). This EUA will remain in effect (meaning this test can be used) for the duration of the COVID-19 declaration under Section 564(b)(1) of the Act, 21 U.S.C. section 360bbb-3(b)(1), unless the authorization is terminated or revoked.  Performed at [facility], [address], [place], [place] [address]     Labs: CBC: Recent Labs  Lab 08/04/24 1459 08/07/24 1328 08/08/24 0346 08/09/24 0254  WBC 5.2 5.8 5.5 5.0  NEUTROABS 2.2 3.4  --   --   HGB 12.7* 11.9* 11.1* 10.1*  HCT 36.9* 34.0* 33.7* 30.2*  MCV 100* 98.6 101.5* 101.7*  PLT 206 208 221 184   Basic Metabolic Panel: Recent Labs  Lab 08/04/24 1459 08/07/24 1328 08/08/24 0346 08/09/24 0254  NA 143 140 142 142  K 4.5 4.0 3.9 4.2  CL 104 103 106 106  CO2 21 22 28 26   GLUCOSE 92 98 95 108*  BUN 60* 65* 63* 53*  CREATININE 2.05* 2.30* 2.05* 1.80*  CALCIUM  9.8 9.7 8.8* 8.6*  MG  --   --  1.9  --   PHOS  --   --  4.0  --    Liver Function Tests: Recent Labs  Lab 08/04/24 1459 08/07/24 1328 08/08/24 1256 08/09/24 0254  AST 104* 53* 38 30  ALT 125* 80* 56* 46*  ALKPHOS 117 94 89 65  BILITOT 0.6 0.4 0.7 0.8  PROT 7.3 7.1 6.6 5.4*  ALBUMIN  4.0 3.8 2.8* 2.3*   CBG: No results for input(s): GLUCAP in the last 168 hours.  Discharge time spent: approximately 25 minutes spent on discharge counseling, evaluation of patient on day of discharge, and coordination of discharge planning with nursing, social work, pharmacy and case management  Signed: Lonni SHAUNNA Dalton, MD [facility] 08/11/2024

## 2024-08-11 NOTE — Progress Notes (Signed)
 Called [facility] twice to give report but got no answer from extension

## 2024-08-12 DIAGNOSIS — I10 Essential (primary) hypertension: Secondary | ICD-10-CM | POA: Diagnosis not present

## 2024-08-12 DIAGNOSIS — N189 Chronic kidney disease, unspecified: Secondary | ICD-10-CM | POA: Diagnosis not present

## 2024-08-12 DIAGNOSIS — I2510 Atherosclerotic heart disease of native coronary artery without angina pectoris: Secondary | ICD-10-CM | POA: Diagnosis not present

## 2024-08-12 DIAGNOSIS — R531 Weakness: Secondary | ICD-10-CM | POA: Diagnosis not present

## 2024-08-12 DIAGNOSIS — N179 Acute kidney failure, unspecified: Secondary | ICD-10-CM | POA: Diagnosis not present

## 2024-08-14 DIAGNOSIS — M6281 Muscle weakness (generalized): Secondary | ICD-10-CM | POA: Diagnosis not present

## 2024-08-14 DIAGNOSIS — N179 Acute kidney failure, unspecified: Secondary | ICD-10-CM | POA: Diagnosis not present

## 2024-08-14 DIAGNOSIS — R2689 Other abnormalities of gait and mobility: Secondary | ICD-10-CM | POA: Diagnosis not present

## 2024-08-14 DIAGNOSIS — I10 Essential (primary) hypertension: Secondary | ICD-10-CM | POA: Diagnosis not present

## 2024-08-14 DIAGNOSIS — R531 Weakness: Secondary | ICD-10-CM | POA: Diagnosis not present

## 2024-08-14 DIAGNOSIS — N189 Chronic kidney disease, unspecified: Secondary | ICD-10-CM | POA: Diagnosis not present

## 2024-08-14 DIAGNOSIS — N183 Chronic kidney disease, stage 3 unspecified: Secondary | ICD-10-CM | POA: Diagnosis not present

## 2024-08-14 DIAGNOSIS — I2510 Atherosclerotic heart disease of native coronary artery without angina pectoris: Secondary | ICD-10-CM | POA: Diagnosis not present

## 2024-08-17 ENCOUNTER — Telehealth: Payer: Self-pay | Admitting: Internal Medicine

## 2024-08-17 DIAGNOSIS — R531 Weakness: Secondary | ICD-10-CM | POA: Diagnosis not present

## 2024-08-17 DIAGNOSIS — I2510 Atherosclerotic heart disease of native coronary artery without angina pectoris: Secondary | ICD-10-CM | POA: Diagnosis not present

## 2024-08-17 DIAGNOSIS — N179 Acute kidney failure, unspecified: Secondary | ICD-10-CM | POA: Diagnosis not present

## 2024-08-17 DIAGNOSIS — I10 Essential (primary) hypertension: Secondary | ICD-10-CM | POA: Diagnosis not present

## 2024-08-17 DIAGNOSIS — N189 Chronic kidney disease, unspecified: Secondary | ICD-10-CM | POA: Diagnosis not present

## 2024-08-17 NOTE — Telephone Encounter (Unsigned)
 Copied from CRM #[id number]. Topic: Appointments - Scheduling Inquiry for Clinic >> Aug 17, 2024 10:18 AM Donee H wrote: Reason for CRM: Patient's daughter [place] Jackson called to schedule appointment with Dr. Jarold for patient. Only would like to she Dr. Jarold. Patient is currently in rehab but will be discharged on Sept. 29, 2025. There are no openings showing until Nov. Daughter states she will be in town until Oct. 11 so would like to see if he can get an appointment within that timeframe. Any day throughout week is fine but would like something after 11am if possible. Please follow up with Mrs. Leonce on request.  [phone]

## 2024-08-18 DIAGNOSIS — N1832 Chronic kidney disease, stage 3b: Secondary | ICD-10-CM | POA: Diagnosis not present

## 2024-08-18 DIAGNOSIS — R2689 Other abnormalities of gait and mobility: Secondary | ICD-10-CM | POA: Diagnosis not present

## 2024-08-18 DIAGNOSIS — N183 Chronic kidney disease, stage 3 unspecified: Secondary | ICD-10-CM | POA: Diagnosis not present

## 2024-08-18 DIAGNOSIS — I1310 Hypertensive heart and chronic kidney disease without heart failure, with stage 1 through stage 4 chronic kidney disease, or unspecified chronic kidney disease: Secondary | ICD-10-CM | POA: Diagnosis not present

## 2024-08-18 DIAGNOSIS — I2510 Atherosclerotic heart disease of native coronary artery without angina pectoris: Secondary | ICD-10-CM | POA: Diagnosis not present

## 2024-08-18 DIAGNOSIS — K222 Esophageal obstruction: Secondary | ICD-10-CM | POA: Diagnosis not present

## 2024-08-18 DIAGNOSIS — M6281 Muscle weakness (generalized): Secondary | ICD-10-CM | POA: Diagnosis not present

## 2024-08-19 DIAGNOSIS — I2510 Atherosclerotic heart disease of native coronary artery without angina pectoris: Secondary | ICD-10-CM | POA: Diagnosis not present

## 2024-08-19 DIAGNOSIS — I10 Essential (primary) hypertension: Secondary | ICD-10-CM | POA: Diagnosis not present

## 2024-08-19 DIAGNOSIS — N179 Acute kidney failure, unspecified: Secondary | ICD-10-CM | POA: Diagnosis not present

## 2024-08-19 DIAGNOSIS — N189 Chronic kidney disease, unspecified: Secondary | ICD-10-CM | POA: Diagnosis not present

## 2024-08-19 DIAGNOSIS — R531 Weakness: Secondary | ICD-10-CM | POA: Diagnosis not present

## 2024-08-21 DIAGNOSIS — R2689 Other abnormalities of gait and mobility: Secondary | ICD-10-CM | POA: Diagnosis not present

## 2024-08-21 DIAGNOSIS — M6281 Muscle weakness (generalized): Secondary | ICD-10-CM | POA: Diagnosis not present

## 2024-08-21 DIAGNOSIS — N183 Chronic kidney disease, stage 3 unspecified: Secondary | ICD-10-CM | POA: Diagnosis not present

## 2024-08-25 DIAGNOSIS — R2689 Other abnormalities of gait and mobility: Secondary | ICD-10-CM | POA: Diagnosis not present

## 2024-08-25 DIAGNOSIS — N183 Chronic kidney disease, stage 3 unspecified: Secondary | ICD-10-CM | POA: Diagnosis not present

## 2024-08-25 DIAGNOSIS — M6281 Muscle weakness (generalized): Secondary | ICD-10-CM | POA: Diagnosis not present

## 2024-08-26 DIAGNOSIS — I2510 Atherosclerotic heart disease of native coronary artery without angina pectoris: Secondary | ICD-10-CM | POA: Diagnosis not present

## 2024-08-26 DIAGNOSIS — R531 Weakness: Secondary | ICD-10-CM | POA: Diagnosis not present

## 2024-08-26 DIAGNOSIS — I10 Essential (primary) hypertension: Secondary | ICD-10-CM | POA: Diagnosis not present

## 2024-08-28 DIAGNOSIS — N183 Chronic kidney disease, stage 3 unspecified: Secondary | ICD-10-CM | POA: Diagnosis not present

## 2024-08-28 DIAGNOSIS — M6281 Muscle weakness (generalized): Secondary | ICD-10-CM | POA: Diagnosis not present

## 2024-08-28 DIAGNOSIS — R2689 Other abnormalities of gait and mobility: Secondary | ICD-10-CM | POA: Diagnosis not present

## 2024-09-01 ENCOUNTER — Telehealth: Payer: Self-pay

## 2024-09-01 NOTE — Transitions of Care (Post Inpatient/ED Visit) (Unsigned)
   09/01/2024  Name: DEANDREA RION MRN: [id number] DOB: 20-Aug-1926  Today's TOC FU Call Status: Today's TOC FU Call Status:: Unsuccessful Call (1st Attempt) Unsuccessful Call (1st Attempt) Date: 09/01/24  Attempted to reach the patient regarding the most recent Inpatient/ED visit.  Follow Up Plan: Additional outreach attempts will be made to reach the patient to complete the Transitions of Care (Post Inpatient/ED visit) call.   Signature Avelina Essex, CMA (AAMA)  [facility] [phone]

## 2024-09-03 ENCOUNTER — Ambulatory Visit: Admitting: Internal Medicine

## 2024-09-03 ENCOUNTER — Encounter: Payer: Self-pay | Admitting: Internal Medicine

## 2024-09-03 VITALS — BP 110/70 | HR 94 | Temp 98.3°F | Ht 63.0 in | Wt 148.8 lb

## 2024-09-03 DIAGNOSIS — F419 Anxiety disorder, unspecified: Secondary | ICD-10-CM

## 2024-09-03 DIAGNOSIS — R5381 Other malaise: Secondary | ICD-10-CM | POA: Diagnosis not present

## 2024-09-03 DIAGNOSIS — I1310 Hypertensive heart and chronic kidney disease without heart failure, with stage 1 through stage 4 chronic kidney disease, or unspecified chronic kidney disease: Secondary | ICD-10-CM

## 2024-09-03 DIAGNOSIS — R7401 Elevation of levels of liver transaminase levels: Secondary | ICD-10-CM | POA: Diagnosis not present

## 2024-09-03 DIAGNOSIS — K5909 Other constipation: Secondary | ICD-10-CM

## 2024-09-03 DIAGNOSIS — D631 Anemia in chronic kidney disease: Secondary | ICD-10-CM | POA: Diagnosis not present

## 2024-09-03 DIAGNOSIS — N1832 Chronic kidney disease, stage 3b: Secondary | ICD-10-CM

## 2024-09-03 DIAGNOSIS — I2510 Atherosclerotic heart disease of native coronary artery without angina pectoris: Secondary | ICD-10-CM

## 2024-09-03 DIAGNOSIS — M2550 Pain in unspecified joint: Secondary | ICD-10-CM | POA: Diagnosis not present

## 2024-09-03 DIAGNOSIS — Z5982 Transportation insecurity: Secondary | ICD-10-CM | POA: Diagnosis not present

## 2024-09-03 DIAGNOSIS — N2581 Secondary hyperparathyroidism of renal origin: Secondary | ICD-10-CM

## 2024-09-03 DIAGNOSIS — N1831 Chronic kidney disease, stage 3a: Secondary | ICD-10-CM

## 2024-09-03 NOTE — Progress Notes (Addendum)
 I,Wayne [name], CMA,acting as a Neurosurgeon for Wayne LOISE Slocumb, MD.,have documented all relevant documentation on the behalf of Wayne LOISE Slocumb, MD,as directed by  Wayne LOISE Slocumb, MD while in the presence of Wayne LOISE Slocumb, MD.  Subjective:  Patient ID: Wayne [name] , male    DOB: Aug 13, 1926 , 89 y.o.   MRN: [id number]  Chief Complaint  Patient presents with  . Hospitalization Follow-up    Patient presents today for hospital follow up. He is accompanied by his daughter. Admitted at [facility] on 9/05 & discharged on 9/9. Today he reports feeling alright. He states his feet & ankles hurt when he walks.  He wants to know is he to keep holding off on the Lasix .     HPI Discussed the use of AI scribe software for clinical note transcription with the patient, who gave verbal consent to proceed.  History of Present Illness Wayne [name] is a 89 year old male with hypertension, stage three chronic kidney disease, and coronary artery disease who presents for a hospital follow-up after recent discharge. He is accompanied by his daughter.  He was admitted to the hospital on September 5th and discharged on September 9th to a skilled nursing facility with diagnoses of shortness of breath, hypertension, stage three chronic kidney disease, anemia, coronary artery disease, and transaminitis. He was discharged from the rehab facility on September 27th after an 18-day stay.  During his hospital stay, an ultrasound of the liver was normal. An EKG showed a right bundle branch block, consistent with his baseline, and a low heart rate, which is usual for him. A test for pulmonary embolism was negative. He was noted to be dehydrated due to inadequate oral intake.  His medications were adjusted during his hospital stay; amlodipine  was continued, while valsartan  and furosemide  were stopped. However, he resumed valsartan  during his rehab stay. He has not been taking furosemide  regularly since returning  home, only taking it once since discharge.  His blood pressure was stable during his rehab stay. His appetite has improved, and he has regained some weight since discharge, having lost 16 pounds between May and September and regained 10 pounds since discharge.  He experiences discomfort in his ankles. He has been using Oxycontin  for pain management, taking one in the morning and another 4-5 hours later, but no more than two per day. He does not find tramadol  effective for his joint pain.  He has constipation, likely related to Oxycontin  use. He drinks water and consumes prunes to help manage this.  He is concerned about his ability to manage at home due to mobility issues, requiring a walker for support. He is anxious about being alone at home and is considering alternative living arrangements.   Hypertension This is a chronic problem. The current episode started more than 1 year ago. The problem has been gradually improving since onset. The problem is controlled. Risk factors for coronary artery disease include male gender and dyslipidemia. Past treatments include calcium  channel blockers, angiotensin blockers, diuretics and lifestyle changes. The current treatment provides moderate improvement. Hypertensive end-organ damage includes kidney disease.     Past Medical History:  Diagnosis Date  . Arthritis   . Bradycardia   . Cataract   . CKD (chronic kidney disease)   . Glaucoma   . History of renal artery stenosis 03/18/2012   right renal PTA/stenting  . Hyperlipidemia   . Hypertension   . Renal cyst, right      Family  History  Problem Relation Age of Onset  . Dementia Mother   . Cancer Father      Current Outpatient Medications:  .  acetaminophen  (TYLENOL ) 500 MG tablet, Take 2 tablets (1,000 mg total) by mouth every 6 (six) hours as needed for mild pain (pain score 1-3)., Disp: 30 tablet, Rfl: 0 .  amLODipine  (NORVASC ) 5 MG tablet, TAKE 1 TABLET BY MOUTH DAILY, Disp: 70  tablet, Rfl: 4 .  aspirin  EC 81 MG tablet, Take 81 mg by mouth daily., Disp: , Rfl:  .  atorvastatin  (LIPITOR) 20 MG tablet, TAKE M,W,F ONLY., Disp: 90 tablet, Rfl: 2 .  feeding supplement (ENSURE PLUS HIGH PROTEIN) LIQD, Take 237 mLs by mouth 2 (two) times daily between meals., Disp: , Rfl:  .  loperamide  (IMODIUM ) 2 MG capsule, Take 1 capsule (2 mg total) by mouth as needed for diarrhea or loose stools., Disp: , Rfl:  .  oxyCODONE -acetaminophen  (PERCOCET) 7.5-325 MG tablet, Take 1 tablet by mouth See admin instructions. Take 1 tablet by mouth daily. Take an extra tablet if needed throughout the day if needed., Disp: 15 tablet, Rfl: 0 .  tamsulosin  (FLOMAX ) 0.4 MG CAPS capsule, Take 1 capsule (0.4 mg total) by mouth daily., Disp: 100 capsule, Rfl: 2 .  timolol  (TIMOPTIC ) 0.5 % ophthalmic solution, Place 1 drop into both eyes daily., Disp: , Rfl:  .  [Paused] furosemide  (LASIX ) 40 MG tablet, Take 1 tablet (40 mg total) by mouth daily. Take 80mg  (2 tablets) by mouth every morning and 40mg  (1 tablet) every evening. (Patient not taking: Reported on 09/03/2024), Disp: , Rfl:  .  gabapentin  (NEURONTIN ) 300 MG capsule, TAKE 1 CAPSULE BY MOUTH TWICE  DAILY, Disp: 200 capsule, Rfl: 2 .  [Paused] valsartan  (DIOVAN ) 80 MG tablet, Take 80 mg by mouth daily. (Patient not taking: Reported on 09/03/2024), Disp: , Rfl:    Allergies  Allergen Reactions  . Cephalexin     Severe mouth and throat dryness.     Review of Systems  Constitutional: Negative.   HENT: Negative.    Respiratory: Negative.    Cardiovascular: Negative.   Gastrointestinal:  Positive for constipation.  Skin: Negative.   Allergic/Immunologic: Negative.   Hematological: Negative.      Today's Vitals   09/03/24 1405  BP: 110/70  Pulse: 94  Temp: 98.3 F (36.8 C)  SpO2: 98%  Weight: 148 lb 12.8 oz (67.5 kg)  Height: 5' 3 (1.6 m)   Body mass index is 26.36 kg/m.  Wt Readings from Last 3 Encounters:  09/03/24 148 lb 12.8 oz (67.5  kg)  08/11/24 138 lb 7.2 oz (62.8 kg)  04/21/24 154 lb 3.2 oz (69.9 kg)     Objective:  Physical Exam Vitals and nursing note reviewed.  Constitutional:      Appearance: Normal appearance.  HENT:     Head: Normocephalic and atraumatic.  Eyes:     Extraocular Movements: Extraocular movements intact.  Cardiovascular:     Rate and Rhythm: Normal rate and regular rhythm.     Heart sounds: Normal heart sounds.  Pulmonary:     Effort: Pulmonary effort is normal.     Breath sounds: Normal breath sounds.  Musculoskeletal:     Right lower leg: Edema present.     Left lower leg: Edema present.     Comments: Ambulatory with walker  Skin:    General: Skin is warm.  Neurological:     General: No focal deficit present.  Mental Status: He is alert.  Psychiatric:        Mood and Affect: Mood normal.         Assessment And Plan:  Malaise and shortness of breath Assessment & Plan: TCM PERFORMED. A MEMBER OF THE CLINICAL TEAM SPOKE WITH THE PATIENT UPON DISCHARGE. DISCHARGE SUMMARY WAS REVIEWED IN FULL DETAIL DURING THE VISIT. MEDS RECONCILED AND COMPARED TO DISCHARGE MEDS. MEDICATION LIST WAS UPDATED AND REVIEWED WITH THE PATIENT. GREATER THAN 50% FACE TO FACE TIME WAS SPENT IN COUNSELING AND COORDINATION OF CARE. ALL QUESTIONS WERE ANSWERED TO THE SATISFACTION OF THE PATIENT.  He feels much better since discharge. He has no new concerns.  Orders: -     AMB Referral VBCI Care Management  Hypertensive heart and renal disease with renal failure, stage 1 through stage 4 or unspecified chronic kidney disease, without heart failure Assessment & Plan: Chronic, well controlled.  Blood pressure controlled at 110/70 mmHg. Amlodipine  continued, valsartan  and furosemide  stopped during hospitalization. Valsartan  restarted at rehab, needs clarification. - Continue amlodipine . - Hold valsartan  and furosemide . - Review current medication list for compliance.  Orders: -     AMB Referral VBCI Care  Management -     CMP14+EGFR  Coronary artery disease involving native coronary artery of native heart without angina pectoris Assessment & Plan: Chronic, LDL goal is less than 70.  He is currently without symptoms of cp, sob.  Coronary artery disease without angina. No new EKG changes, right bundle branch block likely chronic. - Continue current cardiac medications.  Orders: -     AMB Referral VBCI Care Management  Transaminitis Assessment & Plan: Transaminitis likely related to excessive Tylenol  use. Liver ultrasound normal. Monitoring required to prevent further elevation. - Avoid excessive acetaminophen .   Anemia of chronic renal failure, stage 3b (HCC) Assessment & Plan: Chronic, hemoglobin has been stable.  No signs of bleeding.     Hyperparathyroidism, secondary renal Assessment & Plan: Chronic, he is now followed by Nephrology.    Polyarthralgia Assessment & Plan: Chronic joint pain. Limited treatment options due to kidney disease. Tramadol  ineffective. - Use Tylenol  Arthritis or Extra Strength Tylenol  as needed. - Avoid NSAIDs.   Chronic constipation Assessment & Plan: Chronic constipation likely exacerbated by opioid use. Requires regular bowel regimen. - Take stool softener daily. - Consider Miralax  if stool softener ineffective. - Increase dietary fiber intake. - Maintain adequate hydration.   Anxiety -     AMB Referral VBCI Care Management  Transportation insecurity -     AMB Referral VBCI Care Management    Return in 3 months (on 12/04/2024), or bp check3.  Patient was given opportunity to ask questions. Patient verbalized understanding of the plan and was able to repeat key elements of the plan. All questions were answered to their satisfaction.   I, Wayne LOISE Slocumb, MD, have reviewed all documentation for this visit. The documentation on 09/03/24 for the exam, diagnosis, procedures, and orders are all accurate and complete.   IF YOU HAVE BEEN  REFERRED TO A SPECIALIST, IT MAY TAKE 1-2 WEEKS TO SCHEDULE/PROCESS THE REFERRAL. IF YOU HAVE NOT HEARD FROM US /SPECIALIST IN TWO WEEKS, PLEASE GIVE US  A CALL AT [phone] X [phone].   THE PATIENT IS ENCOURAGED TO PRACTICE SOCIAL DISTANCING DUE TO THE COVID-19 PANDEMIC.

## 2024-09-04 ENCOUNTER — Telehealth: Payer: Self-pay

## 2024-09-04 LAB — CMP14+EGFR
ALT: 9 IU/L (ref 0–44)
AST: 16 IU/L (ref 0–40)
Albumin: 3.8 g/dL (ref 3.6–4.6)
Alkaline Phosphatase: 101 IU/L (ref 48–129)
BUN/Creatinine Ratio: 23 (ref 10–24)
BUN: 52 mg/dL — ABNORMAL HIGH (ref 10–36)
Bilirubin Total: 0.4 mg/dL (ref 0.0–1.2)
CO2: 23 mmol/L (ref 20–29)
Calcium: 9.1 mg/dL (ref 8.6–10.2)
Chloride: 98 mmol/L (ref 96–106)
Creatinine, Ser: 2.27 mg/dL — ABNORMAL HIGH (ref 0.76–1.27)
Globulin, Total: 2.9 g/dL (ref 1.5–4.5)
Glucose: 82 mg/dL (ref 70–99)
Potassium: 4.7 mmol/L (ref 3.5–5.2)
Sodium: 137 mmol/L (ref 134–144)
Total Protein: 6.7 g/dL (ref 6.0–8.5)
eGFR: 26 mL/min/1.73 — ABNORMAL LOW (ref >59)

## 2024-09-04 NOTE — Progress Notes (Signed)
 Complex Care Management Note Care Guide Note  09/04/2024 Name: Wayne [name] MRN: [id number] DOB: 05-23-1926   Complex Care Management Outreach Attempts: An unsuccessful telephone outreach was attempted today to offer the patient information about available complex care management services.  Follow Up Plan:  Additional outreach attempts will be made to offer the patient complex care management information and services.   Encounter Outcome:  No Answer-Left voicemail  Leotis Rase [facility] Guide  Direct Dial: [phone]  Fax [phone]

## 2024-09-05 ENCOUNTER — Ambulatory Visit: Payer: Self-pay | Admitting: Internal Medicine

## 2024-09-07 NOTE — Progress Notes (Signed)
 Complex Care Management Note  Care Guide Note 09/07/2024 Name: [name] MRN: [id number] DOB: 1926/04/19  [name] is a 89 y.o. year old male who sees Jarold Medici, MD for primary care. I reached out to Ricardo JONELLE Leaks by phone today to offer complex care management services.  Mr. Treat was given information about Complex Care Management services today including:   The Complex Care Management services include support from the care team which includes your Nurse Care Manager, Clinical Social Worker, or Pharmacist.  The Complex Care Management team is here to help remove barriers to the health concerns and goals most important to you. Complex Care Management services are voluntary, and the patient may decline or stop services at any time by request to their care team member.   Complex Care Management Consent Status: Patient agreed to services and verbal consent obtained.   Follow up plan:  Telephone appointment with complex care management team member scheduled for:  09/28/24 @ 3 PM  Encounter Outcome:  Patient Scheduled  Leotis Rase [facility] Guide  Direct Dial: [phone]  Fax [phone]

## 2024-09-10 ENCOUNTER — Other Ambulatory Visit: Payer: Self-pay | Admitting: Internal Medicine

## 2024-09-13 DIAGNOSIS — K5909 Other constipation: Secondary | ICD-10-CM | POA: Insufficient documentation

## 2024-09-13 DIAGNOSIS — M2550 Pain in unspecified joint: Secondary | ICD-10-CM | POA: Insufficient documentation

## 2024-09-13 NOTE — Assessment & Plan Note (Signed)
 Chronic joint pain. Limited treatment options due to kidney disease. Tramadol  ineffective. - Use Tylenol  Arthritis or Extra Strength Tylenol  as needed. - Avoid NSAIDs.

## 2024-09-13 NOTE — Assessment & Plan Note (Signed)
 Transaminitis likely related to excessive Tylenol  use. Liver ultrasound normal. Monitoring required to prevent further elevation. - Avoid excessive acetaminophen .

## 2024-09-13 NOTE — Assessment & Plan Note (Signed)
 Stage 3 CKD with recent medication adjustments to protect kidney function. Monitoring required to prevent further decline. - Order blood tests to recheck kidney function. - Avoid NSAIDs.

## 2024-09-13 NOTE — Assessment & Plan Note (Signed)
 TCM PERFORMED. A MEMBER OF THE CLINICAL TEAM SPOKE WITH THE PATIENT UPON DISCHARGE. DISCHARGE SUMMARY WAS REVIEWED IN FULL DETAIL DURING THE VISIT. MEDS RECONCILED AND COMPARED TO DISCHARGE MEDS. MEDICATION LIST WAS UPDATED AND REVIEWED WITH THE PATIENT. GREATER THAN 50% FACE TO FACE TIME WAS SPENT IN COUNSELING AND COORDINATION OF CARE. ALL QUESTIONS WERE ANSWERED TO THE SATISFACTION OF THE PATIENT.  He feels much better since discharge. He has no new concerns.

## 2024-09-13 NOTE — Assessment & Plan Note (Signed)
Chronic, he is now followed by Nephrology.

## 2024-09-13 NOTE — Assessment & Plan Note (Signed)
 Chronic, hemoglobin has been stable.  No signs of bleeding.

## 2024-09-13 NOTE — Assessment & Plan Note (Signed)
 Chronic constipation likely exacerbated by opioid use. Requires regular bowel regimen. - Take stool softener daily. - Consider Miralax  if stool softener ineffective. - Increase dietary fiber intake. - Maintain adequate hydration.

## 2024-09-13 NOTE — Assessment & Plan Note (Signed)
 Chronic, LDL goal is less than 70.  He is currently without symptoms of cp, sob.  Coronary artery disease without angina. No new EKG changes, right bundle branch block likely chronic. - Continue current cardiac medications.

## 2024-09-13 NOTE — Assessment & Plan Note (Signed)
 Chronic, well controlled.  Blood pressure controlled at 110/70 mmHg. Amlodipine  continued, valsartan  and furosemide  stopped during hospitalization. Valsartan  restarted at rehab, needs clarification. - Continue amlodipine . - Hold valsartan  and furosemide . - Review current medication list for compliance.

## 2024-09-28 ENCOUNTER — Telehealth: Admitting: Licensed Clinical Social Worker

## 2024-09-30 ENCOUNTER — Other Ambulatory Visit: Payer: Self-pay | Admitting: Licensed Clinical Social Worker

## 2024-09-30 NOTE — Patient Outreach (Addendum)
 LCSW introduced self and explained role in Complex Care Management. Patient requested to reschedule initial appt, to think about whether he would like to participate in services. He will discuss with family. Initial appt was re-scheduled for 10/07/24  2:42 PM LCSW received an incoming call from pt's daughter, Wayne [name]. Endorsed feelings of frustration noting that the appt was supposed to be conducted with her this morning. LCSW apologized for the inconvenience and r/s initial appt. Pt's daughter was also encouraged to have pt complete a DPR to provide medical staff permission to speak with her and/or siblings. She agreed to reach out to PCP office to complete. No additional concerns noted.

## 2024-10-07 ENCOUNTER — Telehealth: Admitting: Licensed Clinical Social Worker

## 2024-10-13 ENCOUNTER — Other Ambulatory Visit: Payer: Self-pay | Admitting: Licensed Clinical Social Worker

## 2024-10-13 NOTE — Patient Instructions (Signed)
 Visit Information  Thank you for taking time to visit with me today. Please don't hesitate to contact me if I can be of assistance to you before our next scheduled appointment.  Our next appointment is by telephone on 12/09 at 10 AM Please call the care guide team at [phone] if you need to cancel or reschedule your appointment.   Following is a copy of your care plan:   Goals Addressed             This Visit's Progress    LCSW VBCI Social Work Care Plan   On track    Problems:   Limited social support  CSW Clinical Goal(s):   Over the next 60 days the Patient will attend all scheduled medical appointments as evidenced by patient report and care team review of appointment completion in electronic MEDICAL RECORD NUMBER  work with Social Worker to address concerns related to strengthening support in the home.  Interventions:  Mental Health:  Evaluation of current treatment plan related to Strengthening Support  Active listening / Reflection utilized Caregiver stress acknowledged :Validation and encouragement provided. Caregiver works with sibling to assist with providing care to patient, who is independent and doing well Consideration on in-home help encouraged : options discussed Discussed caregiver resources and support: Patient is not interested in day programs. Family will f/up on a missed call from DSS regarding a program that pt is eligible for (Family is not aware of name of program, however, was informed he is on the top of the list) Emotional Support Provided Patient denies any anxiety, depression, or stressors at this time  Patient Goals/Self-Care Activities:  Continue taking your medication as prescribed.    Continue utilizing healthy coping skills to assist with any stressors  F/up with DSS about supportive programs to strengthen support in the home  Attend medical appts discussed  Plan:   Telephone follow up appointment with care management team member scheduled  for:  4 weeks        Please call the Suicide and Crisis Lifeline: 988 go to [facility] [address], [place] [phone]) call 911 if you are experiencing a Mental Health or Behavioral Health Crisis or need someone to talk to.  Patient and adult daughter verbalized understanding of Care plan and visit instructions communicated this visit  Rolin Kerns, LCSW [facility]  [facility], [facility] Clinical Social Worker Direct Dial: [phone]  Fax: [phone] Website: [email or web address] 3:57 PM

## 2024-10-13 NOTE — Patient Outreach (Signed)
 Complex Care Management   Visit Note  10/13/2024  Name:  Wayne [name] MRN: [id number] DOB: 19-Dec-1925  Situation: Referral received for Complex Care Management related to SDOH Barriers:  Strengthening Support I obtained verbal consent from Patient.  Patient provided LCSW consent to speak with adult daughter, Wayne [name], to coordinate services with CCM. Visit completed with Patient and Wayne [name] on the phone  Background:   Past Medical History:  Diagnosis Date   Arthritis    Bradycardia    Cataract    CKD (chronic kidney disease)    Glaucoma    History of renal artery stenosis 03/18/2012   right renal PTA/stenting   Hyperlipidemia    Hypertension    Renal cyst, right     Assessment: Patient Reported Symptoms:  Cognitive Cognitive Status: No symptoms reported, Alert and oriented to person, place, and time, Normal speech and language skills Cognitive/Intellectual Conditions Management [RPT]: None reported or documented in medical history or problem list   Health Maintenance Behaviors: Annual physical exam  Neurological Neurological Review of Symptoms: No symptoms reported    HEENT HEENT Symptoms Reported: No symptoms reported      Cardiovascular Cardiovascular Symptoms Reported: No symptoms reported Does patient have uncontrolled Hypertension?: No    Respiratory Respiratory Symptoms Reported: No symptoms reported    Endocrine Endocrine Symptoms Reported: No symptoms reported Is patient diabetic?: No Endocrine Comment: Pt has a scheduled appt with his Nephrologist on 11/12, adult daughter will attend with him  Gastrointestinal Gastrointestinal Symptoms Reported: No symptoms reported      Genitourinary Genitourinary Symptoms Reported: Incontinence Genitourinary Management Strategies: Coping strategies, Incontinence garment/pad  Integumentary Integumentary Symptoms Reported: No symptoms reported    Musculoskeletal Musculoskelatal Symptoms Reviewed: Joint pain Additional  Musculoskeletal Details: Arthritis in feet and ankle Musculoskeletal Management Strategies: Coping strategies, Medication therapy, Routine screening, Medical device Falls in the past year?: No Number of falls in past year: 1 or less Was there an injury with Fall?: No Fall Risk Category Calculator: 0 Patient Fall Risk Level: Low Fall Risk Patient at Risk for Falls Due to: Impaired balance/gait Fall risk Follow up: Falls prevention discussed  Psychosocial Psychosocial Symptoms Reported: No symptoms reported Additional Psychological Details: Pt receives strong support from adult children. Has an aid that visits three times a week and will f/up with DSS for additional services Behavioral Management Strategies: Coping strategies, Support system, Adequate rest Major Change/Loss/Stressor/Fears (CP): Denies Techniques to Cope with Loss/Stress/Change: Spiritual practice(s) Quality of Family Relationships: involved, supportive, helpful Do you feel physically threatened by others?: No    10/13/2024    PHQ2-9 Depression Screening   Little interest or pleasure in doing things    Feeling down, depressed, or hopeless    PHQ-2 - Total Score    Trouble falling or staying asleep, or sleeping too much    Feeling tired or having little energy    Poor appetite or overeating     Feeling bad about yourself - or that you are a failure or have let yourself or your family down    Trouble concentrating on things, such as reading the newspaper or watching television    Moving or speaking so slowly that other people could have noticed.  Or the opposite - being so fidgety or restless that you have been moving around a lot more than usual    Thoughts that you would be better off dead, or hurting yourself in some way    PHQ2-9 Total Score    If you checked off  any problems, how difficult have these problems made it for you to do your work, take care of things at home, or get along with other people    Depression  Interventions/Treatment      There were no vitals filed for this visit.    Medications Reviewed Today     Reviewed by Jayjay Littles D, LCSW (Social Worker) on 10/13/24 at 1518  Med List Status: <None>   Medication Order Taking? Sig Documenting Provider Last Dose Status Informant  acetaminophen  (TYLENOL ) 500 MG tablet [id number] Yes Take 2 tablets (1,000 mg total) by mouth every 6 (six) hours as needed for mild pain (pain score 1-3). Jonel Lonni SQUIBB, MD  Active   amLODipine  (NORVASC ) 5 MG tablet [id number] Yes TAKE 1 TABLET BY MOUTH DAILY Jarold Medici, MD  Active Self, Pharmacy Records  aspirin  EC 81 MG tablet [id number] Yes Take 81 mg by mouth daily. [provider]  Active Self, Pharmacy Records  atorvastatin  (LIPITOR) 20 MG tablet [id number] Yes TAKE M,W,F ONLY. Jarold Medici, MD  Active Self, Pharmacy Records  feeding supplement (ENSURE PLUS HIGH PROTEIN) LIQD [id number] Yes Take 237 mLs by mouth 2 (two) times daily between meals. Jonel Lonni SQUIBB, MD  Active   furosemide  (LASIX ) 40 MG tablet [id number] Yes Take 1 tablet (40 mg total) by mouth daily. Take 80mg  (2 tablets) by mouth every morning and 40mg  (1 tablet) every evening. Jonel Lonni SQUIBB, MD  Active   gabapentin  (NEURONTIN ) 300 MG capsule [id number] Yes TAKE 1 CAPSULE BY MOUTH TWICE  DAILY Jarold Medici, MD  Active   loperamide  (IMODIUM ) 2 MG capsule [id number] Yes Take 1 capsule (2 mg total) by mouth as needed for diarrhea or loose stools. Jonel Lonni SQUIBB, MD  Active   oxyCODONE -acetaminophen  (PERCOCET) 7.5-325 MG tablet [id number] Yes Take 1 tablet by mouth See admin instructions. Take 1 tablet by mouth daily. Take an extra tablet if needed throughout the day if needed. Jonel Lonni SQUIBB, MD  Active   tamsulosin  (FLOMAX ) 0.4 MG CAPS capsule [id number] Yes Take 1 capsule (0.4 mg total) by mouth daily. Jarold Medici, MD  Active Self, Pharmacy Records  timolol  (TIMOPTIC ) 0.5 % ophthalmic solution  [id number] Yes Place 1 drop into both eyes daily. [provider]  Active Self, Pharmacy Records  valsartan  (DIOVAN ) 80 MG tablet [id number] Yes Take 80 mg by mouth daily. [provider]  Active Self, Pharmacy Records            Recommendation:   Specialty provider follow-up Nephrologist 10/14/24  Follow Up Plan:   Telephone follow-up in 1 month  Rolin Kerns, LCSW [place]  [facility], [facility] Clinical Social Worker Direct Dial: [phone]  Fax: [phone] Website: [email or web address] 3:53 PM

## 2024-11-10 ENCOUNTER — Other Ambulatory Visit: Payer: Self-pay

## 2024-11-10 ENCOUNTER — Other Ambulatory Visit: Payer: Self-pay | Admitting: Licensed Clinical Social Worker

## 2024-11-10 DIAGNOSIS — N1831 Chronic kidney disease, stage 3a: Secondary | ICD-10-CM

## 2024-11-10 DIAGNOSIS — G8929 Other chronic pain: Secondary | ICD-10-CM

## 2024-11-10 NOTE — Patient Outreach (Signed)
 Aging Gracefully Program  11/10/2024  TAUNO FALOTICO 1925/12/06 [id number]   [facility] Evaluation Interviewer attempted to call patient on today regarding Aging Gracefully referral. No answer from patient after multiple rings. CMA left confidential voicemail for patient to return call.  Will attempt to call back within 1 week.    Shereen [facility]  Population Health Care Management Assistant  Direct Dial: [phone]  Fax: [phone] Website: [email or web address]

## 2024-11-10 NOTE — Patient Instructions (Signed)
 Visit Information  Thank you for taking time to visit with me today. Please don't hesitate to contact me if I can be of assistance to you before our next scheduled appointment.  Your next care management appointment is by telephone on 12/08/24 at 10 AM  Please call the care guide team at [phone] if you need to cancel, schedule, or reschedule an appointment.   Please call the Suicide and Crisis Lifeline: 988 go to [facility] [address], [place] [phone]) call 911 if you are experiencing a Mental Health or Behavioral Health Crisis or need someone to talk to.  Rolin Kerns, LCSW [facility], [facility] Clinical Social Worker Direct Dial: [phone]  Fax: [phone] Website: [email or web address] 11:54 AM

## 2024-11-10 NOTE — Patient Outreach (Signed)
 Complex Care Management   Visit Note  11/10/2024  Name:  Wayne [name] MRN: [id number] DOB: 28-Jan-1926  Situation: Referral received for Complex Care Management related to SDOH Barriers:  Strengthening Support I obtained verbal consent from Caregiver.  Visit completed with Caregiver  on the phone  Background:   Past Medical History:  Diagnosis Date   Arthritis    Bradycardia    Cataract    CKD (chronic kidney disease)    Glaucoma    History of renal artery stenosis 03/18/2012   right renal PTA/stenting   Hyperlipidemia    Hypertension    Renal cyst, right     Assessment: Patient Reported Symptoms:  Cognitive Cognitive Status: No symptoms reported, Alert and oriented to person, place, and time, Normal speech and language skills Cognitive/Intellectual Conditions Management [RPT]: None reported or documented in medical history or problem list   Health Maintenance Behaviors: Annual physical exam  Neurological Neurological Review of Symptoms: No symptoms reported    HEENT HEENT Symptoms Reported: No symptoms reported      Cardiovascular Cardiovascular Symptoms Reported: No symptoms reported    Respiratory Respiratory Symptoms Reported: No symptoms reported    Endocrine Endocrine Symptoms Reported: No symptoms reported    Gastrointestinal Gastrointestinal Symptoms Reported: No symptoms reported      Genitourinary Genitourinary Symptoms Reported: Incontinence Genitourinary Management Strategies: Coping strategies, Incontinence garment/pad  Integumentary Integumentary Symptoms Reported: No symptoms reported    Musculoskeletal Musculoskelatal Symptoms Reviewed: Joint pain Additional Musculoskeletal Details: Arthritis in feet and ankle Musculoskeletal Management Strategies: Coping strategies, Medication therapy, Routine screening Falls in the past year?: No Number of falls in past year: 1 or less Was there an injury with Fall?: No Fall Risk Category Calculator:  0 Patient Fall Risk Level: Low Fall Risk    Psychosocial Psychosocial Symptoms Reported: No symptoms reported Behavioral Management Strategies: Coping strategies Behavioral Health Self-Management Outcome: 5 (very good) Major Change/Loss/Stressor/Fears (CP): Medical condition, self Techniques to Cope with Loss/Stress/Change: Diversional activities Quality of Family Relationships: helpful, involved, supportive Do you feel physically threatened by others?: No    11/10/2024    PHQ2-9 Depression Screening   Little interest or pleasure in doing things    Feeling down, depressed, or hopeless    PHQ-2 - Total Score    Trouble falling or staying asleep, or sleeping too much    Feeling tired or having little energy    Poor appetite or overeating     Feeling bad about yourself - or that you are a failure or have let yourself or your family down    Trouble concentrating on things, such as reading the newspaper or watching television    Moving or speaking so slowly that other people could have noticed.  Or the opposite - being so fidgety or restless that you have been moving around a lot more than usual    Thoughts that you would be better off dead, or hurting yourself in some way    PHQ2-9 Total Score    If you checked off any problems, how difficult have these problems made it for you to do your work, take care of things at home, or get along with other people    Depression Interventions/Treatment      There were no vitals filed for this visit.    Medications Reviewed Today     Reviewed by Ezzard Rolin BIRCH, LCSW (Social Worker) on 11/10/24 at 1146  Med List Status: <None>   Medication Order Taking? Sig Documenting Provider  Last Dose Status Informant  acetaminophen  (TYLENOL ) 500 MG tablet [id number]  Take 2 tablets (1,000 mg total) by mouth every 6 (six) hours as needed for mild pain (pain score 1-3). Jonel Lonni SQUIBB, MD  Active   amLODipine  (NORVASC ) 5 MG tablet [id number]  TAKE 1  TABLET BY MOUTH DAILY Jarold Medici, MD  Active Self, Pharmacy Records  aspirin  EC 81 MG tablet [id number]  Take 81 mg by mouth daily. [provider]  Active Self, Pharmacy Records  atorvastatin  (LIPITOR) 20 MG tablet [id number]  TAKE M,W,F ONLY. Jarold Medici, MD  Active Self, Pharmacy Records  feeding supplement (ENSURE PLUS HIGH PROTEIN) LIQD [id number]  Take 237 mLs by mouth 2 (two) times daily between meals. Jonel Lonni SQUIBB, MD  Active   furosemide  (LASIX ) 40 MG tablet [id number]  Take 1 tablet (40 mg total) by mouth daily. Take 80mg  (2 tablets) by mouth every morning and 40mg  (1 tablet) every evening. Jonel Lonni SQUIBB, MD  Active   gabapentin  (NEURONTIN ) 300 MG capsule [id number]  TAKE 1 CAPSULE BY MOUTH TWICE  DAILY Jarold Medici, MD  Active   loperamide  (IMODIUM ) 2 MG capsule [id number]  Take 1 capsule (2 mg total) by mouth as needed for diarrhea or loose stools. Jonel Lonni SQUIBB, MD  Active   oxyCODONE -acetaminophen  (PERCOCET) 7.5-325 MG tablet [id number]  Take 1 tablet by mouth See admin instructions. Take 1 tablet by mouth daily. Take an extra tablet if needed throughout the day if needed. Jonel Lonni SQUIBB, MD  Active   tamsulosin  (FLOMAX ) 0.4 MG CAPS capsule [id number]  Take 1 capsule (0.4 mg total) by mouth daily. Jarold Medici, MD  Active Self, Pharmacy Records  timolol  (TIMOPTIC ) 0.5 % ophthalmic solution [id number]  Place 1 drop into both eyes daily. [provider]  Active Self, Pharmacy Records  valsartan  (DIOVAN ) 80 MG tablet [id number]  Take 80 mg by mouth daily. [provider]  Active Self, Pharmacy Records            Recommendation:   PCP Follow-up  Follow Up Plan:   Telephone follow-up in 1 month  Rolin Kerns, LCSW [facility]  [facility], [facility] Clinical Social Worker Direct Dial: [phone]  Fax: [phone] Website: [email or web address] 11:53 AM

## 2024-11-22 ENCOUNTER — Other Ambulatory Visit: Payer: Self-pay | Admitting: Internal Medicine

## 2024-11-24 ENCOUNTER — Encounter: Payer: Self-pay | Admitting: Internal Medicine

## 2024-11-24 ENCOUNTER — Encounter: Payer: Self-pay | Admitting: Licensed Clinical Social Worker

## 2024-11-24 ENCOUNTER — Ambulatory Visit: Admitting: Internal Medicine

## 2024-11-24 ENCOUNTER — Telehealth: Admitting: Licensed Clinical Social Worker

## 2024-11-24 VITALS — BP 118/78 | HR 63 | Temp 98.3°F | Ht 63.0 in | Wt 152.0 lb

## 2024-11-24 DIAGNOSIS — N1832 Chronic kidney disease, stage 3b: Secondary | ICD-10-CM

## 2024-11-24 DIAGNOSIS — L03116 Cellulitis of left lower limb: Secondary | ICD-10-CM | POA: Diagnosis not present

## 2024-11-24 DIAGNOSIS — M150 Primary generalized (osteo)arthritis: Secondary | ICD-10-CM | POA: Diagnosis not present

## 2024-11-24 DIAGNOSIS — I2510 Atherosclerotic heart disease of native coronary artery without angina pectoris: Secondary | ICD-10-CM | POA: Diagnosis not present

## 2024-11-24 DIAGNOSIS — I1310 Hypertensive heart and chronic kidney disease without heart failure, with stage 1 through stage 4 chronic kidney disease, or unspecified chronic kidney disease: Secondary | ICD-10-CM | POA: Diagnosis not present

## 2024-11-24 MED ORDER — DOXYCYCLINE HYCLATE 100 MG PO TABS: 100.0000 mg | ORAL_TABLET | Freq: Two times a day (BID) | ORAL | 0 refills | Status: AC

## 2024-11-24 NOTE — Progress Notes (Signed)
 I,[name], CMA,acting as a neurosurgeon for Wayne LOISE Slocumb, MD.,have documented all relevant documentation on the behalf of Wayne LOISE Slocumb, MD,as directed by  Wayne LOISE Slocumb, MD while in the presence of Wayne LOISE Slocumb, MD.  Subjective:  Patient ID: Wayne [name] , male    DOB: 1926/08/10 , 89 y.o.   MRN: [id number]  Chief Complaint  Patient presents with   Left Leg Draining     Patient presents today for left leg drainage. He states last night his left leg drained to the point where it drenched the band aid he has on it. He admits the wound stings. He also notices an opening on the skin where the drainage is coming out of.  Denies any pain.    HPI Discussed the use of AI scribe software for clinical note transcription with the patient, who gave verbal consent to proceed.  History of Present Illness Wayne [name] is a 89 year old male who presents with a leaking leg wound. He is accompanied by a nurse and a family member, [name].  He has a leaking wound on his leg that began approximately two weeks ago. Initially, the wound was wet upon waking, requiring the removal of a soaked Band-Aid. Despite using a sample lubrication and Band-Aids, the wound has worsened, with significant fluid leakage soaking through his socks and paper. The skin became rough, and scratching it led to the leakage.  He frequently experiences chills, which he attributes to his arthritis, and is unsure if he has had a fever. His left leg swells if he does not use a calf support, while the right leg does not swell. He manages the swelling with large Band-Aids and a calf support.  He has a history of arthritis, causing pain with every step and affecting his equilibrium. Elevating his legs for more than 15-20 minutes results in pain and aching due to arthritis. He spends most of his time in a recliner but can stretch out on a sofa to avoid having his legs hang down.  His last visit to a kidney doctor was  in November when his daughter was home. He has not had recent antibiotics due to concerns about his kidney function. He is not allergic to any medications but jokes about being 'allergic to needles.'    Past Medical History:  Diagnosis Date   Arthritis    Bradycardia    Cataract    CKD (chronic kidney disease)    Glaucoma    History of renal artery stenosis 03/18/2012   right renal PTA/stenting   Hyperlipidemia    Hypertension    Renal cyst, right      Family History  Problem Relation Age of Onset   Dementia Mother    Cancer Father     Current Medications[1]   Allergies[2]   Review of Systems  Constitutional: Negative.   Respiratory: Negative.    Cardiovascular: Negative.   Gastrointestinal: Negative.   Skin:  Positive for color change and rash.  Allergic/Immunologic: Negative.   Hematological: Negative.      Today's Vitals   11/24/24 1603  BP: 118/78  Pulse: 63  Temp: 98.3 F (36.8 C)  SpO2: 98%  Weight: 152 lb (68.9 kg)  Height: 5' 3 (1.6 m)   Body mass index is 26.93 kg/m.  Wt Readings from Last 3 Encounters:  11/24/24 152 lb (68.9 kg)  09/03/24 148 lb 12.8 oz (67.5 kg)  08/11/24 138 lb 7.2 oz (62.8 kg)  The ASCVD Risk score (Arnett DK, et al., 2019) failed to calculate for the following reasons:   The 2019 ASCVD risk score is only valid for ages 45 to 87   * - Cholesterol units were assumed  Objective:  Physical Exam Vitals and nursing note reviewed.  Constitutional:      Appearance: Normal appearance.  HENT:     Head: Normocephalic and atraumatic.  Cardiovascular:     Rate and Rhythm: Normal rate and regular rhythm.     Heart sounds: Normal heart sounds.  Pulmonary:     Effort: Pulmonary effort is normal.     Breath sounds: Normal breath sounds.  Musculoskeletal:     Right lower leg: Edema present.     Left lower leg: Edema present.  Skin:    General: Skin is warm.     Findings: Erythema present.     Comments: Posterior LLE  erythema, serosanguinous drainage Warm to touch Tendre to touch  Neurological:     General: No focal deficit present.     Mental Status: He is alert.  Psychiatric:        Mood and Affect: Mood normal.         Assessment And Plan:   Assessment & Plan Cellulitis of left lower extremity Cellulitis with redness and warmth, likely from skin irritation and breakdown. Concern for infection due to drainage. - Applied antibiotic cream to affected area. - Arranged for wound care nurse to visit home for assessment and management. - Prescribed antibiotics to address potential infection. - Advised use of nonstick gauze to manage drainage. - Instructed to keep leg elevated when possible, using recliner or sofa. Primary osteoarthritis involving multiple joints Chronic osteoarthritis causing pain and mobility issues. Pain worsens with leg elevation. - Advised on alternative methods to keep legs elevated without causing pain. Hypertensive heart and renal disease with renal failure, stage 1 through stage 4 or unspecified chronic kidney disease, without heart failure Chronic, well controlled.  Blood pressure controlled at 110/70 mmHg.  - Continue amlodipine .  Coronary artery disease involving native coronary artery of native heart without angina pectoris Chronic, LDL goal is less than 70.  He is currently without symptoms of cp, sob.  Coronary artery disease without angina.  - Continue current cardiac medications. Stage 3b chronic kidney disease (HCC) Chronic osteoarthritis causing pain and mobility issues. Pain worsens with leg elevation. - Advised on alternative methods to keep legs elevated without causing pain.    Orders Placed This Encounter  Procedures   Ambulatory referral to Home Health     Return if symptoms worsen or fail to improve.  Patient was given opportunity to ask questions. Patient verbalized understanding of the plan and was able to repeat key elements of the plan. All  questions were answered to their satisfaction.    I, Wayne LOISE Slocumb, MD, have reviewed all documentation for this visit. The documentation on 11/24/2024 for the exam, diagnosis, procedures, and orders are all accurate and complete.   IF YOU HAVE BEEN REFERRED TO A SPECIALIST, IT MAY TAKE 1-2 WEEKS TO SCHEDULE/PROCESS THE REFERRAL. IF YOU HAVE NOT HEARD FROM US /SPECIALIST IN TWO WEEKS, PLEASE GIVE US  A CALL AT [phone] X [phone].      [1]  Current Outpatient Medications:    acetaminophen  (TYLENOL ) 500 MG tablet, Take 2 tablets (1,000 mg total) by mouth every 6 (six) hours as needed for mild pain (pain score 1-3)., Disp: 30 tablet, Rfl: 0   amLODipine  (NORVASC ) 5 MG tablet,  TAKE 1 TABLET BY MOUTH DAILY, Disp: 100 tablet, Rfl: 2   aspirin  EC 81 MG tablet, Take 81 mg by mouth daily., Disp: , Rfl:    atorvastatin  (LIPITOR) 20 MG tablet, TAKE M,W,F ONLY., Disp: 90 tablet, Rfl: 2   doxycycline  (VIBRA -TABS) 100 MG tablet, Take 1 tablet (100 mg total) by mouth 2 (two) times daily., Disp: 20 tablet, Rfl: 0   [Paused] furosemide  (LASIX ) 40 MG tablet, Take 1 tablet (40 mg total) by mouth daily. Take 80mg  (2 tablets) by mouth every morning and 40mg  (1 tablet) every evening., Disp: , Rfl:    gabapentin  (NEURONTIN ) 300 MG capsule, TAKE 1 CAPSULE BY MOUTH TWICE  DAILY, Disp: 200 capsule, Rfl: 2   loperamide  (IMODIUM ) 2 MG capsule, Take 1 capsule (2 mg total) by mouth as needed for diarrhea or loose stools., Disp: , Rfl:    oxyCODONE -acetaminophen  (PERCOCET) 7.5-325 MG tablet, Take 1 tablet by mouth See admin instructions. Take 1 tablet by mouth daily. Take an extra tablet if needed throughout the day if needed., Disp: 15 tablet, Rfl: 0   tamsulosin  (FLOMAX ) 0.4 MG CAPS capsule, Take 1 capsule (0.4 mg total) by mouth daily., Disp: 100 capsule, Rfl: 2   timolol  (TIMOPTIC ) 0.5 % ophthalmic solution, Place 1 drop into both eyes daily., Disp: , Rfl:    [Paused] valsartan  (DIOVAN ) 80 MG tablet, Take 80 mg by mouth  daily., Disp: , Rfl:  [2]  Allergies Allergen Reactions   Cephalexin     Severe mouth and throat dryness.

## 2024-11-27 ENCOUNTER — Encounter: Payer: Self-pay | Admitting: Internal Medicine

## 2024-11-27 NOTE — Assessment & Plan Note (Signed)
 Chronic, LDL goal is less than 70.  He is currently without symptoms of cp, sob.  Coronary artery disease without angina.  - Continue current cardiac medications.

## 2024-11-27 NOTE — Patient Instructions (Signed)
 Cellulitis, Adult    Cellulitis is a skin infection. The infected area is often warm, red, swollen, and sore. It occurs most often on the legs, feet, and toes, but can happen on any part of the body.  This condition can be life-threatening without treatment. It is very important to get treated right away.  What are the causes?  This condition is caused by bacteria. The bacteria enter through a break in the skin, such as:  A cut.  A burn.  A bug bite.  An animal bite.  An open sore.  A crack.  What increases the risk?  Having a weak body's defense system (immune system).  Being older than 88 years old.  Having a blood sugar problem (diabetes).  Having a long-term liver disease (cirrhosis) or kidney disease.  Being very overweight (obese).  Having a skin problem, such as:  An itchy rash.  A rash caused by a fungus.  A rash with blisters.  Slow movement of blood in the veins (venous stasis).  Fluid buildup below the skin (edema).  This condition is more likely to occur in people who:  Have open cuts, burns, bites, or scrapes on the skin.  Have been treated with high-energy rays (radiation).  Use IV drugs.  What are the signs or symptoms?  Skin that:  Looks red or purple, or slightly darker than your usual skin color.  Has streaks.  Has spots.  Is swollen.  Is sore or painful when you touch it.  Is warm.  A fever.  Chills.  Blisters.  Tiredness (fatigue).  How is this treated?  Medicines to treat infections or allergies.  Rest.  Placing cold or warm cloths on the skin.  Staying in the hospital, if the condition is very bad. You may need medicines through an IV.  Follow these instructions at home:  Medicines  Take over-the-counter and prescription medicines only as told by your doctor.  If you were prescribed antibiotics, take them as told by your doctor. Do not stop using them even if you start to feel better.  General instructions  Drink enough fluid to keep your pee (urine) pale yellow.  Do not touch or rub the  infected area.  Raise (elevate) the infected area above the level of your heart while you are sitting or lying down.  Return to your normal activities when your doctor says that it is safe.  Place cold or warm cloths on the area as told by your doctor.  Keep all follow-up visits. Your doctor will need to make sure that a more serious infection is not developing.  Contact a doctor if:  You have a fever.  You do not start to get better after 1-2 days of treatment.  Your bone or joint under the infected area starts to hurt after the skin has healed.  Your infection comes back in the same area or another area. Signs of this may include:  You have a swollen bump in the area.  Your red area gets larger, turns dark in color, or hurts more.  You have more fluid coming from the wound.  Pus or a bad smell develops in your infected area.  You have more pain.  You feel sick and have muscle aches and weakness.  You develop vomiting or watery poop that will not go away.  Get help right away if:  You see red streaks coming from the area.  You notice the skin turns purple or black and falls  off.  These symptoms may be an emergency. Get help right away. Call 911.  Do not wait to see if the symptoms will go away.  Do not drive yourself to the hospital.  This information is not intended to replace advice given to you by your health care provider. Make sure you discuss any questions you have with your health care provider.  Document Revised: 07/17/2022 Document Reviewed: 07/17/2022  Elsevier Patient Education  2024 [employer].

## 2024-11-27 NOTE — Assessment & Plan Note (Signed)
 Chronic, well controlled.  Blood pressure controlled at 110/70 mmHg.  - Continue amlodipine .

## 2024-11-27 NOTE — Assessment & Plan Note (Signed)
 Chronic osteoarthritis causing pain and mobility issues. Pain worsens with leg elevation. - Advised on alternative methods to keep legs elevated without causing pain.

## 2024-11-27 NOTE — Assessment & Plan Note (Signed)
 Cellulitis with redness and warmth, likely from skin irritation and breakdown. Concern for infection due to drainage. - Applied antibiotic cream to affected area. - Arranged for wound care nurse to visit home for assessment and management. - Prescribed antibiotics to address potential infection. - Advised use of nonstick gauze to manage drainage. - Instructed to keep leg elevated when possible, using recliner or sofa.

## 2024-12-01 ENCOUNTER — Telehealth: Payer: Self-pay

## 2024-12-01 NOTE — Telephone Encounter (Unsigned)
 Copied from CRM #[id number]. Topic: General - Other >> Dec 01, 2024 12:38 PM Alfonso HERO wrote: Reason for CRM: patient calling because the home health nurse hasn't been to his home and he wants to see if someone can reach out to find out what's going on.

## 2024-12-07 ENCOUNTER — Telehealth: Payer: Self-pay | Admitting: Licensed Clinical Social Worker

## 2024-12-07 ENCOUNTER — Encounter: Payer: Self-pay | Admitting: Licensed Clinical Social Worker

## 2024-12-07 NOTE — Patient Outreach (Signed)
 12/07/2024  Sw received a call from the Care Guide in reference to the patients daughter India needing to reschedule for the BSW call. The SW called the patients daughter back and was able to reschedule to 12/09/2024 at 2:pm, and notified the LCSW to call to reschedule to do the follow up after the BSW initial.    Tobias CHARM Maranda HEDWIG, PhD [facility]), [facility] Social Worker Direct Dial: [phone]  Fax: [phone]

## 2024-12-08 ENCOUNTER — Telehealth: Admitting: Licensed Clinical Social Worker

## 2024-12-09 ENCOUNTER — Other Ambulatory Visit: Payer: Self-pay

## 2024-12-09 ENCOUNTER — Telehealth: Payer: Self-pay | Admitting: Licensed Clinical Social Worker

## 2024-12-09 NOTE — Patient Outreach (Addendum)
 Social Drivers of Health  Community Resource and Care Coordination Visit Note   12/09/2024  Name: Wayne [name] MRN: [id number] DOB:[birth year]  Situation: Referral received for [facility] needs assessment and assistance related to Transportation and Level of Care. I obtained verbal consent from Caregiver.  Visit completed with DPR India Jackson on the phone.   Background:   SDOH Interventions Today    Flowsheet Row Most Recent Value  SDOH Interventions   Food Insecurity Interventions Intervention Not Indicated, Other (Comment)  [Has Meals on Wheels]  Housing Interventions Intervention Not Indicated  Transportation Interventions Intervention Not Indicated, Patient Resources (Friends/Family)  [Considering SCAT but will have to address ramp location on the home and wheelchair]  Utilities Interventions Intervention Not Indicated  Financial Strain Interventions Intervention Not Indicated     Assessment:   Goals Addressed             This Visit's Progress    BSW Goals       Current SDOH Barriers:  Transportation Level of care concerns  Interventions: Patient interviewed and appropriate screenings performed Referred patient to community resources  Provided patient with information about DSS In Home Aide Services Discussed plans with patient for ongoing follow up and provided patient with direct contact number Advised patient to reconsider ramp/wheelchair in order to use SCAT services.  Currently, patient is transported via aide or family.  Ms. Leonce, pt daughter, will contact new insurance [employer] for transportation options. Ms. Leonce is also encouraged to provide the updated insurance card to the provider.   Pt has a private pay aide that assist 3 days a week with errands and personal care.   Pt was approved for Meals on Wheels Sept/Oct 2025.          Recommendation:   Contact DSS In Home Aide services to be added to the waiting list, contact [employer] to  inquire on transportation benefits, reconsider ramp/wheelchair/SCAT.  Follow Up Plan:   Telephone follow up appointment date/time:  12/23/24 at 11am with Tobias Maranda Tillman Carolynn, BSW [place]  [facility], [facility] Social Worker Direct Dial: [phone]  Fax: [phone] Website: [email or web address]

## 2024-12-09 NOTE — Patient Instructions (Signed)
 Visit Information  Thank you for taking time to visit with me today. Please don't hesitate to contact me if I can be of assistance to you before our next scheduled appointment.  Your next care management appointment is by telephone on 12/23/24 at 11am with Tobias Moose   Please call the care guide team at [phone] if you need to cancel, schedule, or reschedule an appointment.   Please call 911 if you are experiencing a Mental Health or Behavioral Health Crisis or need someone to talk to.  Wayne [name], BSW [place]  [facility], [facility] Social Worker Direct Dial: [phone]  Fax: [phone] Website: [email or web address]

## 2024-12-10 ENCOUNTER — Ambulatory Visit: Payer: Self-pay | Admitting: Internal Medicine

## 2024-12-14 ENCOUNTER — Other Ambulatory Visit: Payer: Self-pay | Admitting: Licensed Clinical Social Worker

## 2024-12-16 NOTE — Patient Outreach (Signed)
 Complex Care Management   Visit Note  12/14/2024  Name:  Wayne [name] MRN: [id number] DOB: 09/28/1926  Situation: Referral received for Complex Care Management related to SDOH Barriers:  Strengthening Support I obtained verbal consent from Caregiver.  Visit completed with Caregiver  on the phone  Background:   Past Medical History:  Diagnosis Date   Arthritis    Bradycardia    Cataract    CKD (chronic kidney disease)    Glaucoma    History of renal artery stenosis 03/18/2012   right renal PTA/stenting   Hyperlipidemia    Hypertension    Renal cyst, right     Assessment: Patient Reported Symptoms:  Cognitive Cognitive Status: No symptoms reported, Able to follow simple commands, Normal speech and language skills Cognitive/Intellectual Conditions Management [RPT]: None reported or documented in medical history or problem list   Health Maintenance Behaviors: Annual physical exam  Neurological Neurological Review of Symptoms: No symptoms reported    HEENT HEENT Symptoms Reported: Not assessed      Cardiovascular Cardiovascular Symptoms Reported: No symptoms reported    Respiratory Respiratory Symptoms Reported: No symptoms reported    Endocrine Endocrine Symptoms Reported: No symptoms reported    Gastrointestinal Gastrointestinal Symptoms Reported: No symptoms reported      Genitourinary Genitourinary Symptoms Reported: Incontinence Genitourinary Management Strategies: Incontinence garment/pad  Integumentary Integumentary Symptoms Reported: Wound Skin Management Strategies: Coping strategies Skin Comment: Caregiver reports Wayne [name] is requesting a new referral for wound care/RN f/up  Musculoskeletal Musculoskelatal Symptoms Reviewed: Joint pain Musculoskeletal Management Strategies: Medication therapy, Coping strategies, Routine screening      Psychosocial Psychosocial Symptoms Reported: No symptoms reported Behavioral Management Strategies: Adequate rest,  Coping strategies, Support system Behavioral Health Comment: Pt receives strong support from family Major Change/Loss/Stressor/Fears (CP): Medical condition, self Techniques to Cope with Loss/Stress/Change: Diversional activities Quality of Family Relationships: helpful, involved, supportive    12/16/2024    PHQ2-9 Depression Screening   Little interest or pleasure in doing things    Feeling down, depressed, or hopeless    PHQ-2 - Total Score    Trouble falling or staying asleep, or sleeping too much    Feeling tired or having little energy    Poor appetite or overeating     Feeling bad about yourself - or that you are a failure or have let yourself or your family down    Trouble concentrating on things, such as reading the newspaper or watching television    Moving or speaking so slowly that other people could have noticed.  Or the opposite - being so fidgety or restless that you have been moving around a lot more than usual    Thoughts that you would be better off dead, or hurting yourself in some way    PHQ2-9 Total Score    If you checked off any problems, how difficult have these problems made it for you to do your work, take care of things at home, or get along with other people    Depression Interventions/Treatment      There were no vitals filed for this visit.    Medications Reviewed Today     Reviewed by Jamela Cumbo D, LCSW (Social Worker) on 12/16/24 at 1011  Med List Status: <None>   Medication Order Taking? Sig Documenting Provider Last Dose Status Informant  acetaminophen  (TYLENOL ) 500 MG tablet [id number]  Take 2 tablets (1,000 mg total) by mouth every 6 (six) hours as needed for mild pain (pain score 1-3).  Jonel Lonni SQUIBB, MD  Active   amLODipine  (NORVASC ) 5 MG tablet [id number]  TAKE 1 TABLET BY MOUTH DAILY Jarold Medici, MD  Active   aspirin  EC 81 MG tablet [id number]  Take 81 mg by mouth daily. [provider]  Active Self, Pharmacy Records   atorvastatin  (LIPITOR) 20 MG tablet [id number]  TAKE M,W,F ONLY. Jarold Medici, MD  Active Self, Pharmacy Records  doxycycline  (VIBRA -TABS) 100 MG tablet [id number]  Take 1 tablet (100 mg total) by mouth 2 (two) times daily. Jarold Medici, MD  Active   furosemide  (LASIX ) 40 MG tablet [id number]  Take 1 tablet (40 mg total) by mouth daily. Take 80mg  (2 tablets) by mouth every morning and 40mg  (1 tablet) every evening. Jonel Lonni SQUIBB, MD  Active   gabapentin  (NEURONTIN ) 300 MG capsule [id number]  TAKE 1 CAPSULE BY MOUTH TWICE  DAILY Jarold Medici, MD  Active   loperamide  (IMODIUM ) 2 MG capsule [id number]  Take 1 capsule (2 mg total) by mouth as needed for diarrhea or loose stools. Danford, Lonni SQUIBB, MD  Active   oxyCODONE -acetaminophen  (PERCOCET) 7.5-325 MG tablet [id number]  Take 1 tablet by mouth See admin instructions. Take 1 tablet by mouth daily. Take an extra tablet if needed throughout the day if needed. Jonel Lonni SQUIBB, MD  Active   tamsulosin  (FLOMAX ) 0.4 MG CAPS capsule [id number]  Take 1 capsule (0.4 mg total) by mouth daily. Jarold Medici, MD  Active Self, Pharmacy Records  timolol  (TIMOPTIC ) 0.5 % ophthalmic solution [id number]  Place 1 drop into both eyes daily. [provider]  Active Self, Pharmacy Records  valsartan  (DIOVAN ) 80 MG tablet [id number]  Take 80 mg by mouth daily. [provider]  Active Self, Pharmacy Records            Recommendation:   Home Health requests: Skilled Nursing  Follow Up Plan:   Telephone follow-up in 1 month  Rolin Kerns, LCSW [facility]  [name], [name] Clinical Social Worker Direct Dial: [phone]  Fax: [phone] Website: [email or web address] 10:19 AM

## 2024-12-16 NOTE — Patient Instructions (Signed)
 Visit Information  Thank you for taking time to visit with me today. Please don't hesitate to contact me if I can be of assistance to you before our next scheduled appointment.  Your next care management appointment is by telephone on 2/9 at 2:30 PM  Please call the care guide team at [phone] if you need to cancel, schedule, or reschedule an appointment.   Please call the Suicide and Crisis Lifeline: 988 go to [facility] [address], [place] [id number]) call 911 if you are experiencing a Mental Health or Behavioral Health Crisis or need someone to talk to.  Rolin Kerns, LCSW [facility], [facility] Clinical Social Worker Direct Dial: [phone]  Fax: [phone] Website: [email or web address] 10:20 AM

## 2024-12-18 ENCOUNTER — Encounter: Payer: Self-pay | Admitting: Internal Medicine

## 2024-12-22 ENCOUNTER — Other Ambulatory Visit: Payer: Self-pay

## 2024-12-22 NOTE — Patient Outreach (Signed)
 Aging Gracefully Program  12/22/2024  Wayne [name] [birth year] [id number]   [facility] Evaluation Interviewer attempted to call patient on today regarding Aging Gracefully follow up. Someone answered after multiple rings but hung up. CMA unable to leave confidential voicemail for patient to return call.  Will attempt to call back within 1 week.    Shereen [facility]  Population Health Care Management Assistant  Direct Dial: [phone]  Fax: [phone] Website: [email or web address]

## 2024-12-23 ENCOUNTER — Telehealth: Payer: Self-pay

## 2024-12-23 ENCOUNTER — Other Ambulatory Visit: Payer: Self-pay | Admitting: Licensed Clinical Social Worker

## 2024-12-23 NOTE — Patient Outreach (Signed)
 Social Drivers of Health  Community Resource and Care Coordination Visit Note   12/23/2024  Name: Wayne [name] MRN: [id number] DOB:1925/12/17  Situation: Referral received for [facility] needs assessment and assistance related to Transportation. I obtained verbal consent from Caregiver Patient.  Visit completed with Patient Wayne [name] on the phone.   Background:   SDOH Interventions Today    Flowsheet Row Most Recent Value  SDOH Interventions   Food Insecurity Interventions Intervention Not Indicated  Housing Interventions Intervention Not Indicated  Transportation Interventions Intervention Not Indicated  Utilities Interventions Intervention Not Indicated     Assessment:   Goals Addressed             This Visit's Progress    COMPLETED: BSW Goals       Current SDOH Barriers:  Transportation Level of care concerns  Interventions: Patient interviewed and appropriate screenings performed Referred patient to community resources  Provided patient with information about DSS In Home [employer] Discussed plans with patient for ongoing follow up and provided patient with direct contact number Advised patient to reconsider ramp/wheelchair in order to use SCAT services.  Currently, patient is transported via aide or family.  Wayne [name], pt daughter, will contact new insurance [employer] for transportation options. Wayne [name] is also encouraged to provide the updated insurance card to the provider.   Pt has a private pay aide that assist 3 days a week with errands and personal care.   Pt was approved for Meals on Wheels Sept/Oct 2025.          Recommendation:   attend all scheduled provider appointments ask for help if you don't understand your health insurance benefits Patients daughter stated that they will currenlty do what they are doing for transportation and decided on not to do the ramp because the house (front porch and back door will not allow) They will call  and make another referral if they change their mind  Follow Up Plan:   Patient has achieved all patient stated goals. [employer] will be closed. Patient has been provided contact information should new needs arise.   Wayne CHARM Maranda HEDWIG, PhD [facility], [facility] Social Worker Direct Dial: [phone]  Fax: [phone]

## 2024-12-23 NOTE — Patient Instructions (Signed)

## 2024-12-23 NOTE — Progress Notes (Signed)
 Pharmacy Quality Measure Review  This patient is appearing on a report for being at risk of failing the adherence measure for diabetes medications this calendar year.   Medication: Atorvastatin  20mg  tablet Last fill date: 08/11/24 for 30 day supply  Atorvastatin  20mg  was discontinued  on 08/05/2024 by Dr. Jarold due to increased liver enzymes.   Furkan Keenum Student - Pharm D

## 2024-12-24 ENCOUNTER — Other Ambulatory Visit: Payer: Self-pay

## 2024-12-24 ENCOUNTER — Ambulatory Visit: Payer: Self-pay

## 2024-12-24 ENCOUNTER — Emergency Department (HOSPITAL_BASED_OUTPATIENT_CLINIC_OR_DEPARTMENT_OTHER)
Admission: EM | Admit: 2024-12-24 | Discharge: 2024-12-24 | Disposition: A | Discharge status: Home / Self Care | Attending: Emergency Medicine | Admitting: Emergency Medicine

## 2024-12-24 ENCOUNTER — Encounter (HOSPITAL_BASED_OUTPATIENT_CLINIC_OR_DEPARTMENT_OTHER): Payer: Self-pay | Admitting: Emergency Medicine

## 2024-12-24 DIAGNOSIS — R001 Bradycardia, unspecified: Secondary | ICD-10-CM | POA: Insufficient documentation

## 2024-12-24 DIAGNOSIS — R600 Localized edema: Secondary | ICD-10-CM | POA: Insufficient documentation

## 2024-12-24 DIAGNOSIS — Z7982 Long term (current) use of aspirin: Secondary | ICD-10-CM | POA: Insufficient documentation

## 2024-12-24 DIAGNOSIS — Z79899 Other long term (current) drug therapy: Secondary | ICD-10-CM | POA: Diagnosis not present

## 2024-12-24 LAB — CBC WITH DIFFERENTIAL/PLATELET
Abs Immature Granulocytes: 0.01 K/uL (ref 0.00–0.07)
Basophils Absolute: 0 K/uL (ref 0.0–0.1)
Basophils Relative: 1 %
Eosinophils Absolute: 0 K/uL (ref 0.0–0.5)
Eosinophils Relative: 1 %
HCT: 35.9 % — ABNORMAL LOW (ref 39.0–52.0)
Hemoglobin: 12 g/dL — ABNORMAL LOW (ref 13.0–17.0)
Immature Granulocytes: 0 %
Lymphocytes Relative: 44 %
Lymphs Abs: 1.9 K/uL (ref 0.7–4.0)
MCH: 33.8 pg (ref 26.0–34.0)
MCHC: 33.4 g/dL (ref 30.0–36.0)
MCV: 101.1 fL — ABNORMAL HIGH (ref 80.0–100.0)
Monocytes Absolute: 0.5 K/uL (ref 0.1–1.0)
Monocytes Relative: 12 %
Neutro Abs: 1.8 K/uL (ref 1.7–7.7)
Neutrophils Relative %: 42 %
Platelets: 180 K/uL (ref 150–400)
RBC: 3.55 MIL/uL — ABNORMAL LOW (ref 4.22–5.81)
RDW: 13 % (ref 11.5–15.5)
WBC: 4.2 K/uL (ref 4.0–10.5)
nRBC: 0 % (ref 0.0–0.2)

## 2024-12-24 LAB — COMPREHENSIVE METABOLIC PANEL WITH GFR
ALT: 11 U/L (ref 0–44)
AST: 22 U/L (ref 15–41)
Albumin: 4.1 g/dL (ref 3.5–5.0)
Alkaline Phosphatase: 105 U/L (ref 38–126)
Anion gap: 11 (ref 5–15)
BUN: 41 mg/dL — ABNORMAL HIGH (ref 8–23)
CO2: 28 mmol/L (ref 22–32)
Calcium: 9.8 mg/dL (ref 8.9–10.3)
Chloride: 104 mmol/L (ref 98–111)
Creatinine, Ser: 1.44 mg/dL — ABNORMAL HIGH (ref 0.61–1.24)
GFR, Estimated: 44 mL/min — ABNORMAL LOW
Glucose, Bld: 88 mg/dL (ref 70–99)
Potassium: 4.4 mmol/L (ref 3.5–5.1)
Sodium: 142 mmol/L (ref 135–145)
Total Bilirubin: 0.4 mg/dL (ref 0.0–1.2)
Total Protein: 7 g/dL (ref 6.5–8.1)

## 2024-12-24 LAB — TROPONIN T, HIGH SENSITIVITY
Troponin T High Sensitivity: 42 ng/L — ABNORMAL HIGH (ref 0–19)
Troponin T High Sensitivity: 43 ng/L — ABNORMAL HIGH (ref 0–19)

## 2024-12-24 NOTE — ED Notes (Signed)
 Pt ambulatory to RR using home walker, no assistance from staff

## 2024-12-24 NOTE — Telephone Encounter (Signed)
 Daughter calling to ask what PCP would like pt to do for low HR found by wound care today. Daughter was advised that if wound care spoke to NT they would advise the pt based off s/s, if ED advised, pt should seek ED care.  See NT note for earlier today. Unable to locate DPR paperwork prior to pt daughter disconnected.   Reason for Triage: Wayne [name], the patient's daughter, is calling because a triage nurse from wound care reported the patient's heart rate was low-47 today and 51 on Monday. She is requesting further guidance.

## 2024-12-24 NOTE — Telephone Encounter (Signed)
 FYI Only or Action Required?: FYI only for provider: ED advised.  Patient was last seen in primary care on 11/24/2024 by Jarold Medici, MD.  Called Nurse Triage reporting Heart Problem.  Symptoms began today.  Interventions attempted: Nothing.  Symptoms are: unchanged.  Triage Disposition: Go to ED Now (Notify PCP)  Patient/caregiver understands and will follow disposition?: Unsure  Summary: Brachycardia- 48   Reason for Triage:  Jili[place] LPN with [facility] called in stating she is with the patient and reporting a heart rate of 48. I will transfer her to E2C2 NT     Reason for Disposition  [1] Heart beating very slowly (e.g., < 50 / minute) AND [2] present now  (Exception: Athlete and heart rate is normal for caller.)  Answer Assessment - Initial Assessment Questions Patient's home health nurse with [facility] calling in to report current heart rate of 48. Patient denies any sob, chest pain, weakness, dizziness, or sweating. Patient's last heart rate on home health visit was 52, but unsure of patient's normal HR range. ED advised.   1. DESCRIPTION: Please describe your heart rate or heartbeat that you are having (e.g., fast/slow, regular/irregular, skipped or extra beats, palpitations)     Heart rate 48bpm  2. ONSET: When did it start? (e.g., minutes, hours, days)      Today  3. DURATION: How long does it last (e.g., seconds, minutes, hours)     Current   4. PATTERN Does it come and go, or has it been constant since it started?  Does it get worse with exertion?   Are you feeling it now?     This is current heart rate  5. TAP: Using your hand, can you tap out what you are feeling on a chair or table in front of you, so that I can hear? Note: Not all patients can do this.       NA  6. HEART RATE: Can you tell me your heart rate? How many beats in 15 seconds?  Note: Not all patients can do this.       48  7. RECURRENT SYMPTOM: Have  you ever had this before? If Yes, ask: When was the last time? and What happened that time?      Unknown  8. CAUSE: What do you think is causing the palpitations?     No palpitations  9. CARDIAC HISTORY: Do you have any history of heart disease? (e.g., heart attack, angina, bypass surgery, angioplasty, arrhythmia)      CAD  10. OTHER SYMPTOMS: Do you have any other symptoms? (e.g., dizziness, chest pain, sweating, difficulty breathing)       Denies any other symptoms  11. PREGNANCY: Is there any chance you are pregnant? When was your last menstrual period?       NA  Protocols used: Heart Rate and Heartbeat Questions-A-AH

## 2024-12-24 NOTE — ED Notes (Signed)
 EDP at bedside

## 2024-12-24 NOTE — Discharge Instructions (Signed)
 1.  You are not having any symptoms when you have slow heart rate.  If you have symptoms of shortness of breath, feeling like you will pass out, weakness or other concerning changes return to the emergency department immediately. 2.  Your eyedrop called timolol  can slow down the heart rate.  Do not take it tomorrow and call your family doctor.  You report being evaluated for slow heart rate before and no changes were made.  As you do not have any symptoms, you may not need any changes but discuss this with your family doctor.

## 2024-12-24 NOTE — ED Notes (Signed)
 Called to triage x1. Patient states he needs to use the bathroom before being triaged.

## 2024-12-24 NOTE — ED Provider Notes (Signed)
 " [place] EMERGENCY DEPARTMENT AT [facility] Provider Note   CSN: [id number] Arrival date & time: 12/24/24  1450     Patient presents with: Bradycardia   Wayne [name] is a 89 y.o. male.   HPI Patient has recently had a home health nurse.  They have come by the past 2 days to check on the patient.  They have noted that his heart rate has been in the low 40s and 50s.  They were concerned and advised the patient to get evaluation.  The patient denies any symptoms.  He denies feeling short of breath, weak or having chest pain.  He has not had any near syncopal episodes.  He ambulates with a walker and has been functioning at baseline.  He has no complaints.  Reports that he does chronically have swelling in his legs but is actually better right now because he has been following some instructions for elevating and been started on Lasix .  After evaluating, patient did advise that he had been sent by his doctor last summer to a cardiologist for slow heart rate and ultimately they did not think anything needed to be done.    Prior to Admission medications  Medication Sig Start Date End Date Taking? Authorizing Provider  acetaminophen  (TYLENOL ) 500 MG tablet Take 2 tablets (1,000 mg total) by mouth every 6 (six) hours as needed for mild pain (pain score 1-3). 08/11/24   Danford, Lonni SQUIBB, MD  amLODipine  (NORVASC ) 5 MG tablet TAKE 1 TABLET BY MOUTH DAILY 11/23/24   Jarold Medici, MD  aspirin  EC 81 MG tablet Take 81 mg by mouth daily.    [provider]  atorvastatin  (LIPITOR) 20 MG tablet TAKE M,W,F ONLY. 09/12/23   Jarold Medici, MD  doxycycline  (VIBRA -TABS) 100 MG tablet Take 1 tablet (100 mg total) by mouth 2 (two) times daily. 11/24/24   Jarold Medici, MD  [Paused] furosemide  (LASIX ) 40 MG tablet Take 1 tablet (40 mg total) by mouth daily. Take 80mg  (2 tablets) by mouth every morning and 40mg  (1 tablet) every evening. Wait to take this until your doctor or other  care provider tells you to start again. 08/11/24   Danford, Lonni SQUIBB, MD  gabapentin  (NEURONTIN ) 300 MG capsule TAKE 1 CAPSULE BY MOUTH TWICE  DAILY 09/10/24   Jarold Medici, MD  loperamide  (IMODIUM ) 2 MG capsule Take 1 capsule (2 mg total) by mouth as needed for diarrhea or loose stools. 08/11/24   Danford, Lonni SQUIBB, MD  oxyCODONE -acetaminophen  (PERCOCET) 7.5-325 MG tablet Take 1 tablet by mouth See admin instructions. Take 1 tablet by mouth daily. Take an extra tablet if needed throughout the day if needed. 08/11/24   Danford, Lonni SQUIBB, MD  tamsulosin  (FLOMAX ) 0.4 MG CAPS capsule Take 1 capsule (0.4 mg total) by mouth daily. 06/25/24   Jarold Medici, MD  timolol  (TIMOPTIC ) 0.5 % ophthalmic solution Place 1 drop into both eyes daily. 08/19/23   [provider]  [Paused] valsartan  (DIOVAN ) 80 MG tablet Take 80 mg by mouth daily. Wait to take this until your doctor or other care provider tells you to start again.    [provider]    Allergies: Cephalexin    Review of Systems  Updated Vital Signs BP (!) 160/72   Pulse (!) 54   Temp 98.4 F (36.9 C) (Oral)   Resp 14   Ht 5' 3 (1.6 m)   Wt 69.4 kg   SpO2 98%   BMI 27.10 kg/m  Physical Exam Constitutional:      Comments: Patient is alert and nontoxic.  Mental status clear.  No respiratory distress.  HENT:     Mouth/Throat:     Pharynx: Oropharynx is clear.  Cardiovascular:     Rate and Rhythm: Regular rhythm. Bradycardia present.     Heart sounds: Normal heart sounds.  Pulmonary:     Effort: Pulmonary effort is normal.     Breath sounds: Normal breath sounds.  Abdominal:     General: There is no distension.     Palpations: Abdomen is soft.     Tenderness: There is no abdominal tenderness. There is no guarding.  Musculoskeletal:     Comments: 2+ edema bilateral lower extremities.  No calf tenderness.  No erythema.  Skin:    General: Skin is warm and dry.  Neurological:     General: No focal  deficit present.     (all labs ordered are listed, but only abnormal results are displayed) Labs Reviewed  COMPREHENSIVE METABOLIC PANEL WITH GFR - Abnormal; Notable for the following components:      Result Value   BUN 41 (*)    Creatinine, Ser 1.44 (*)    GFR, Estimated 44 (*)    All other components within normal limits  CBC WITH DIFFERENTIAL/PLATELET - Abnormal; Notable for the following components:   RBC 3.55 (*)    Hemoglobin 12.0 (*)    HCT 35.9 (*)    MCV 101.1 (*)    All other components within normal limits  TROPONIN T, HIGH SENSITIVITY - Abnormal; Notable for the following components:   Troponin T High Sensitivity 42 (*)    All other components within normal limits  TROPONIN T, HIGH SENSITIVITY - Abnormal; Notable for the following components:   Troponin T High Sensitivity 43 (*)    All other components within normal limits    EKG: EKG Interpretation Date/Time:  Thursday December 24 2024 15:12:45 EST Ventricular Rate:  78 PR Interval:  137 QRS Duration:  132 QT Interval:  389 QTC Calculation: 444 R Axis:   245  Text Interpretation: Sinus rhythm RBBB and LAFB rate increaed from previous, otherwise similar Confirmed by Armenta Canning [phone]) on 12/24/2024 3:18:20 PM  Radiology: No results found.   Procedures   Medications Ordered in the ED - No data to display                                  Medical Decision Making Amount and/or Complexity of Data Reviewed Labs: ordered.   Patient presents as outlined.  He is clinically well in appearance.  Will obtain basic lab work.  EKG reviewed by myself sinus bradycardia no significant change from previous.  Cardiac monitor shows sinus bradycardia ranging from high 40s to mid 50s.  Troponins flat at 40-43 comprehensive metabolic panel BUN 41 creatinine 1.44 GFR 44 this is a baseline for the patient.  White count 4.2 H&H 12 and 35.  This H&H is higher than baseline for the patient.  Patient is clinically well  in appearance and asymptomatic.  He does use timolol  drops.  This may be impacting bradycardia but is asymptomatic.  After discussing with the patient, he has already seen cardiology for bradycardia.  EMR shows he had a visit in 5\14\24.  Rates were consistent with what were seen today.  He was asymptomatic and there was no recommendation for further diagnostic evaluation or intervention.  At this time, I do not think there are any changes needed.  I have advised the patient he could hold the timolol  and discuss this with his PCP and ophthalmologist.  However, being asymptomatic I do not feel that this is critical at this time.  Patient is discharged in stable condition.     Final diagnoses:  Bradycardia    ED Discharge Orders     None          Armenta Canning, MD 12/24/24 2033  "

## 2024-12-24 NOTE — ED Notes (Signed)
Patient remains in restroom at this time.

## 2024-12-24 NOTE — ED Triage Notes (Signed)
 Pt arrived POV caox4 ambulatory with walker reporting home health has come to see pt for the past 2 days and they've told him his HR was low 40-50's. Pt denies CP, SOB, N/V, dizziness, or any other complaint.

## 2025-01-06 ENCOUNTER — Other Ambulatory Visit: Payer: Self-pay

## 2025-01-06 NOTE — Patient Outreach (Signed)
 Aging Gracefully Program  01/06/2025  Wayne [name] 26-Mar-1926 [id number]   [facility] Evaluation Interviewer attempted to call patient on today regarding Aging Gracefully follow up. Someone answered after multiple rings but hung up. CMA unable to leave confidential voicemail for patient to return call.   Will attempt to call back within 1 week.    Shereen Gin [facility]  Population Health VBCI Assistant Direct Dial: [phone]  Fax: [phone] Website: [email or web address]

## 2025-01-11 ENCOUNTER — Telehealth: Admitting: Licensed Clinical Social Worker

## 2025-03-10 ENCOUNTER — Ambulatory Visit: Payer: Self-pay

## 2025-03-10 ENCOUNTER — Ambulatory Visit: Payer: Self-pay | Admitting: Internal Medicine
